# Patient Record
Sex: Female | Born: 1940 | Race: White | Hispanic: No | Marital: Married | Smoking: Former smoker
Health system: Southern US, Community
[De-identification: ages and names within clinical notes are randomized; demographics above are authoritative.]

## PROBLEM LIST (undated history)

## (undated) DIAGNOSIS — K529 Noninfective gastroenteritis and colitis, unspecified: Secondary | ICD-10-CM

## (undated) DIAGNOSIS — J310 Chronic rhinitis: Secondary | ICD-10-CM

## (undated) DIAGNOSIS — K635 Polyp of colon: Secondary | ICD-10-CM

## (undated) DIAGNOSIS — J849 Interstitial pulmonary disease, unspecified: Secondary | ICD-10-CM

## (undated) DIAGNOSIS — E119 Type 2 diabetes mellitus without complications: Secondary | ICD-10-CM

## (undated) DIAGNOSIS — K624 Stenosis of anus and rectum: Secondary | ICD-10-CM

## (undated) DIAGNOSIS — K589 Irritable bowel syndrome without diarrhea: Secondary | ICD-10-CM

## (undated) DIAGNOSIS — E079 Disorder of thyroid, unspecified: Secondary | ICD-10-CM

## (undated) HISTORY — PX: DILATION AND CURETTAGE OF UTERUS: SHX78

## (undated) HISTORY — DX: Polyp of colon: K63.5

## (undated) HISTORY — DX: Stenosis of anus and rectum: K62.4

## (undated) HISTORY — DX: Interstitial pulmonary disease, unspecified: J84.9

## (undated) HISTORY — DX: Chronic rhinitis: J31.0

## (undated) HISTORY — PX: OTHER SURGICAL HISTORY: SHX169

## (undated) HISTORY — PX: CHOLECYSTECTOMY: SHX55

## (undated) HISTORY — DX: Disorder of thyroid, unspecified: E07.9

## (undated) HISTORY — DX: Noninfective gastroenteritis and colitis, unspecified: K52.9

## (undated) HISTORY — DX: Irritable bowel syndrome, unspecified: K58.9

## (undated) HISTORY — DX: Type 2 diabetes mellitus without complications: E11.9

---

## 1989-06-11 HISTORY — PX: HAND SURGERY: SHX662

## 1989-12-24 ENCOUNTER — Encounter: Payer: Self-pay | Admitting: Internal Medicine

## 1993-12-01 ENCOUNTER — Encounter: Payer: Self-pay | Admitting: Internal Medicine

## 1997-11-23 ENCOUNTER — Ambulatory Visit (HOSPITAL_COMMUNITY): Admission: RE | Admit: 1997-11-23 | Discharge: 1997-11-23 | Payer: Self-pay | Admitting: Internal Medicine

## 1998-02-16 ENCOUNTER — Ambulatory Visit (HOSPITAL_COMMUNITY): Admission: RE | Admit: 1998-02-16 | Discharge: 1998-02-16 | Payer: Self-pay | Admitting: Neurosurgery

## 1998-02-16 ENCOUNTER — Encounter: Payer: Self-pay | Admitting: Neurosurgery

## 1999-12-04 ENCOUNTER — Emergency Department (HOSPITAL_COMMUNITY): Admission: EM | Admit: 1999-12-04 | Discharge: 1999-12-04 | Payer: Self-pay | Admitting: Emergency Medicine

## 2000-03-10 ENCOUNTER — Ambulatory Visit (HOSPITAL_COMMUNITY): Admission: RE | Admit: 2000-03-10 | Discharge: 2000-03-10 | Payer: Self-pay | Admitting: Family Medicine

## 2000-03-10 ENCOUNTER — Encounter: Payer: Self-pay | Admitting: Family Medicine

## 2000-07-11 ENCOUNTER — Ambulatory Visit (HOSPITAL_BASED_OUTPATIENT_CLINIC_OR_DEPARTMENT_OTHER): Admission: RE | Admit: 2000-07-11 | Discharge: 2000-07-11 | Payer: Self-pay | Admitting: Orthopedic Surgery

## 2000-07-16 ENCOUNTER — Ambulatory Visit (HOSPITAL_COMMUNITY): Admission: RE | Admit: 2000-07-16 | Discharge: 2000-07-16 | Payer: Self-pay | Admitting: Orthopedic Surgery

## 2001-04-17 ENCOUNTER — Encounter: Admission: RE | Admit: 2001-04-17 | Discharge: 2001-04-17 | Payer: Self-pay | Admitting: Family Medicine

## 2001-04-17 ENCOUNTER — Encounter: Payer: Self-pay | Admitting: Family Medicine

## 2004-12-19 ENCOUNTER — Ambulatory Visit: Payer: Self-pay | Admitting: Internal Medicine

## 2006-04-11 ENCOUNTER — Encounter (INDEPENDENT_AMBULATORY_CARE_PROVIDER_SITE_OTHER): Payer: Self-pay | Admitting: Specialist

## 2006-04-11 ENCOUNTER — Ambulatory Visit (HOSPITAL_BASED_OUTPATIENT_CLINIC_OR_DEPARTMENT_OTHER): Admission: RE | Admit: 2006-04-11 | Discharge: 2006-04-11 | Payer: Self-pay | Admitting: Surgery

## 2006-12-12 ENCOUNTER — Ambulatory Visit: Payer: Self-pay | Admitting: Internal Medicine

## 2007-10-09 DIAGNOSIS — K624 Stenosis of anus and rectum: Secondary | ICD-10-CM | POA: Insufficient documentation

## 2007-10-09 DIAGNOSIS — R197 Diarrhea, unspecified: Secondary | ICD-10-CM

## 2007-10-09 DIAGNOSIS — K589 Irritable bowel syndrome without diarrhea: Secondary | ICD-10-CM

## 2008-02-04 ENCOUNTER — Encounter: Payer: Self-pay | Admitting: Internal Medicine

## 2008-02-09 ENCOUNTER — Encounter: Payer: Self-pay | Admitting: Internal Medicine

## 2008-03-04 ENCOUNTER — Telehealth: Payer: Self-pay | Admitting: Internal Medicine

## 2008-03-11 ENCOUNTER — Encounter: Payer: Self-pay | Admitting: Internal Medicine

## 2008-04-12 ENCOUNTER — Ambulatory Visit: Payer: Self-pay | Admitting: Internal Medicine

## 2008-04-12 DIAGNOSIS — F341 Dysthymic disorder: Secondary | ICD-10-CM | POA: Insufficient documentation

## 2008-10-27 ENCOUNTER — Encounter: Payer: Self-pay | Admitting: Internal Medicine

## 2009-01-18 ENCOUNTER — Other Ambulatory Visit: Admission: RE | Admit: 2009-01-18 | Discharge: 2009-01-18 | Payer: Self-pay | Admitting: Family Medicine

## 2009-07-19 ENCOUNTER — Encounter: Payer: Self-pay | Admitting: Internal Medicine

## 2009-08-04 ENCOUNTER — Encounter: Payer: Self-pay | Admitting: Internal Medicine

## 2009-10-11 ENCOUNTER — Ambulatory Visit: Payer: Self-pay | Admitting: Internal Medicine

## 2009-10-18 ENCOUNTER — Telehealth: Payer: Self-pay | Admitting: Internal Medicine

## 2009-11-10 ENCOUNTER — Telehealth: Payer: Self-pay | Admitting: Internal Medicine

## 2010-01-16 ENCOUNTER — Telehealth: Payer: Self-pay | Admitting: Internal Medicine

## 2010-01-17 ENCOUNTER — Ambulatory Visit: Payer: Self-pay | Admitting: Internal Medicine

## 2010-01-19 ENCOUNTER — Encounter: Payer: Self-pay | Admitting: Internal Medicine

## 2010-01-24 ENCOUNTER — Telehealth: Payer: Self-pay | Admitting: Internal Medicine

## 2010-01-25 ENCOUNTER — Encounter: Payer: Self-pay | Admitting: Internal Medicine

## 2010-06-11 ENCOUNTER — Emergency Department (HOSPITAL_COMMUNITY)
Admission: EM | Admit: 2010-06-11 | Discharge: 2010-06-11 | Payer: Self-pay | Source: Home / Self Care | Admitting: Emergency Medicine

## 2010-06-29 ENCOUNTER — Encounter: Payer: Self-pay | Admitting: Internal Medicine

## 2010-06-29 ENCOUNTER — Ambulatory Visit (HOSPITAL_BASED_OUTPATIENT_CLINIC_OR_DEPARTMENT_OTHER)
Admission: RE | Admit: 2010-06-29 | Discharge: 2010-06-29 | Payer: Self-pay | Source: Home / Self Care | Attending: Family Medicine | Admitting: Family Medicine

## 2010-07-10 ENCOUNTER — Ambulatory Visit
Admission: RE | Admit: 2010-07-10 | Discharge: 2010-07-10 | Payer: Self-pay | Source: Home / Self Care | Attending: Internal Medicine | Admitting: Internal Medicine

## 2010-07-10 DIAGNOSIS — R05 Cough: Secondary | ICD-10-CM | POA: Insufficient documentation

## 2010-07-10 DIAGNOSIS — J8410 Pulmonary fibrosis, unspecified: Secondary | ICD-10-CM | POA: Insufficient documentation

## 2010-07-13 NOTE — Assessment & Plan Note (Signed)
Summary: RX RENEWAL .Marland KitchenMarland KitchenEM   History of Present Illness Visit Type: Follow-up Visit Primary GI MD: Lina Sar MD Primary Provider: Carolynn Sayers, PA-C Requesting Provider: n/a Chief Complaint: F/u for IBS. Pt c/o three episodes of diarrhea since December. 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 She is here to refill her chlorazepate and Lomotil. She had one episode of incontinent stool and mucus last week. Overall, she has done better in the last few years than  back in the 1990s when I last saw her for her colonoscopy. She has a positive family history of colon polyps in her sister. She is status post remote cholecystectomy. The patient has been stressed out about her marital problems.Last colon 1995. , pt reminded to schedule recall  several times in past several years.   GI Review of Systems      Denies abdominal pain, acid reflux, belching, bloating, chest pain, dysphagia with liquids, dysphagia with solids, heartburn, loss of appetite, nausea, vomiting, vomiting blood, weight loss, and  weight gain.      Reports diarrhea.     Denies anal fissure, black tarry stools, change in bowel habit, constipation, diverticulosis, fecal incontinence, heme positive stool, hemorrhoids, irritable bowel syndrome, jaundice, light color stool, liver problems, rectal bleeding, and  rectal pain.    Current Medications (verified): 1)  Clorazepate Dipotassium 3.75 Mg Tabs (Clorazepate Dipotassium) .... Take 1 Tablet By Mouth Three Times A Day. Must Have Office Visit For Further Refills! 2)  Lomotil 2.5-0.025 Mg Tabs (Diphenoxylate-Atropine) .... Take 2 Tablets By Mouth Every Morning 3)  Hydrocodone-Acetaminophen 5-500 Mg Tabs (Hydrocodone-Acetaminophen) .... One Tablet By Mouth As Needed 4)  Levothyroxine Sodium 88 Mcg Tabs  (Levothyroxine Sodium) .... One Tablet By Mouth Once Daily 5)  Verapamil Hcl Cr 120 Mg Xr24h-Cap (Verapamil Hcl) .... 1/2 Tablet By Mouth Two Times A Day 6)  Cyclobenzaprine Hcl 10 Mg Tabs (Cyclobenzaprine Hcl) .... 1/2 To 1  Tablet By Mouth At Bedtime 7)  Glucosamine-Chondroitin  Tabs (Glucosamine-Chondroit-Vit C-Mn) .... One Tablet By Mouth Two Times A Day 8)  Calcium Carbonate-Vitamin D 600-400 Mg-Unit  Tabs (Calcium Carbonate-Vitamin D) .... One Tablet By Mouth Two Times A Day 9)  Vitamin E 600 Unit  Caps (Vitamin E) .... One Tablet By Mouth Once Daily 10)  Vitamin C 500 Mg  Tabs (Ascorbic Acid) .... One Tablet By Mouth Once Daily 11)  Fish Oil 1000 Mg Caps (Omega-3 Fatty Acids) .... One Tablet By Mouth Once Daily 12)  Biotin 1000 Mcg Tabs (Biotin) .... One Tablet By Mouth Once Daily 13)  Multivitamins   Tabs (Multiple Vitamin) .... One Tablet By Mouth Once Daily  Allergies (verified): 1)  ! Pcn 2)  ! Cortisone 3)  ! Biaxin  Past History:  Past Medical History: Reviewed history from 10/09/2007 and no changes required. 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 L hand surgery; Baker's Cyst removed 1991 D & C Cholecystectomy Tear Duct surgery  Family History: Family History of Colon Polyps: Sister? Family History of Diabetes: Sister Family History of Heart Disease: Mother No FH of Colon Cancer:  Social History: Retired  Alcohol Use - no Illicit Drug Use - no  Patient has never smoked.  Daily Caffeine Use  Review of Systems       The patient complains of cough and fatigue.  The patient denies allergy/sinus, anemia, anxiety-new, arthritis/joint pain, back pain, blood in urine, breast changes/lumps, change in vision, confusion, coughing up blood, depression-new, fainting, fever, headaches-new,  hearing problems, heart murmur, heart rhythm changes, itching, menstrual pain, muscle pains/cramps, night sweats, nosebleeds, pregnancy symptoms, shortness of breath, skin rash, sleeping problems, sore throat, swelling of feet/legs, swollen lymph glands, thirst - excessive , urination - excessive , urination changes/pain, urine leakage, vision changes, and voice change.         Pertinent positive and negative review of systems were noted in the above HPI. All other ROS was otherwise negative.   Vital Signs:  Patient profile:   70 year old female Height:      66 inches Weight:      182 pounds BMI:     29.48 BSA:     1.92 Pulse rate:   76 / minute Pulse rhythm:   regular BP sitting:   120 / 76  (left arm) Cuff size:   regular  Vitals Entered By: Ok Anis CMA (Oct 11, 2009 10:11 AM)  Physical Exam  General:  Well developed, well nourished, no acute distress. Eyes:  PERRLA, no icterus. Mouth:  No deformity or lesions, dentition normal. Neck:  Supple; no masses or thyromegaly. Lungs:  Clear throughout to auscultation. Heart:  Regular rate and rhythm; no murmurs, rubs,  or bruits. Abdomen:  Soft, nontender and nondistended. No masses, hepatosplenomegaly or hernias noted. Normal bowel sounds. Rectal:  decreased rectal sphincter tone. soft Hemoccult negative stool. No external hemorrhoids, no prolapse. Extremities:  No clubbing, cyanosis, edema or deformities noted. Skin:  Intact without significant lesions or rashes. Psych:  Alert and cooperative. Normal mood and affect.   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 There has been no significant change since my last exam on her.  Orders: Colonoscopy (Colon)  Problem # 2:  Hx of DIARRHEA, CHRONIC (ICD-787.91)  Patient has IBS with predominant diarrhea cntrolled on clorazepate and Lomotil.  Patient is due for her colonoscopy.  Orders: Colonoscopy (Colon)  Problem # 3:  FAMILY HISTORY COLONIC POLYPS (ICD-V18.51)  Patient has a family history of colon polyps in the her sister. She is due for a colonoscopy at this time and we will go forward with scheduling that.  Orders: Colonoscopy (Colon)  Patient Instructions: 1)  colonoscopy has been scheduled. 2)  Refill chlorazepate 3.75 mg 3 times a day. 3)  Refill Lomotil. 4)  I suggest the patient have counseling for depression. 5)  Copy sent to : Dr Kenton Kingfisher 6)  The medication list was reviewed and reconciled.  All changed / newly prescribed medications were explained.  A complete medication list was provided to the patient / caregiver. Prescriptions: CLORAZEPATE DIPOTASSIUM 3.75 MG TABS (CLORAZEPATE DIPOTASSIUM) Take 1 tablet by mouth three times a day.  #90 x 11   Entered by:   Lamona Curl CMA (AAMA)   Authorized by:   Hart Carwin MD   Signed by:   Lamona Curl CMA (AAMA) on 10/11/2009   Method used:   Printed then faxed to ...       [facility] ([facility]       [address]  [place], [place]  [address]       Ph: [phone] or [phone]       Fax: [phone]   RxID:   [id number] LOMOTIL 2.5-0.025 MG TABS (DIPHENOXYLATE-ATROPINE) Take 2 tablets by mouth every morning  #100 x 2   Entered by:   Lamona Curl CMA (AAMA)   Authorized by:   Hart Carwin MD   Signed by:   Lamona Curl CMA (AAMA) on 10/11/2009   Method used:   Printed then faxed to ...       [facility] ([facility]       [address]       [place], [place]  [address]       Ph: [phone] or [phone]       Fax: [phone]   RxID:   [id number] DULCOLAX 5 MG  TBEC (BISACODYL) Day before procedure take 2 at 3pm and 2 at 8pm.  #4 x 0   Entered by:   Lamona Curl CMA (AAMA)   Authorized by:   Hart Carwin MD   Signed by:   Lamona Curl CMA (AAMA) on 10/11/2009   Method used:    Electronically to        [facility] ([facility]       [address]       [place], [place]  [address]       Ph: [phone] or [phone]       Fax: [phone]   RxID:   [id number] REGLAN 10 MG  TABS (METOCLOPRAMIDE HCL) As per prep instructions.  #2 x 0   Entered by:   Lamona Curl CMA (AAMA)   Authorized by:   Hart Carwin MD   Signed by:   Lamona Curl CMA (AAMA) on 10/11/2009   Method used:   Electronically to        [facility] ([facility]       [address]       [place], [place]  [address]       Ph: [phone] or [phone]       Fax: [phone]   RxID:   [id number] MIRALAX   POWD (POLYETHYLENE GLYCOL 3350) As per prep  instructions.  #255gm x 0   Entered by:   Lamona Curl CMA (AAMA)   Authorized by:   Hart Carwin MD   Signed by:   Lamona Curl CMA (AAMA) on 10/11/2009   Method used:   Electronically to        [facility]       [address]       [place], [place]  [address]       Ph: [phone] or [phone]       Fax: [phone]   RxID:   [id number]

## 2010-07-13 NOTE — Progress Notes (Signed)
Summary: Wants to speak to Dr Juanda Chance  Phone Note Call from Patient Call back at [phone]   Caller: Patient Call For: Dr Juanda Chance Reason for Call: Talk to Nurse Summary of Call: Pt requesting to speak w/Dr Juanda Chance, did not wish to say why.   Initial call taken by: Misty Stanley,  January 16, 2010 1:37 PM  Follow-up for Phone Call        Dr Juanda Chance-  Patient refused to give any information as to what we can help her with. She only wants to speak with you so not sure what it is in regards to. She may be calling about her chlorazepate refills. Last time she was in the office, you ask that if she cancelled her colonoscopy again (she cx a couple times previously) to discontinue her refills until she had the procedure as she continues to have diarrhea at which time she comes to be seen in the office. At the time that she cancelled her last procedure scheduled for 11/15/09, I discontinued all prescriptions until she has her procedure. Please see my note dated 11/10/09 as well as the append. She is now scheduled for colonoscopy tommorrow at 8 am. Lamona Curl CMA [facility])  January 16, 2010 5:06 PM   Additional Follow-up for Phone Call Additional follow up Details #1::        I will talked to her at the time of colonoscopy Additional Follow-up by: Hart Carwin MD,  January 16, 2010 9:03 PM

## 2010-07-13 NOTE — Progress Notes (Signed)
Summary: refill complications  Phone Note Call from Patient Call back at [phone]   Caller: Patient Call For: Dr. Juanda Chance Reason for Call: Talk to Nurse Summary of Call: pt was told by pharmacy that she would need an office visit before any refills and pt thinks this is wrong information [facility], [phone] Initial call taken by: Vallarie Mare,  November 10, 2009 2:01 PM  Follow-up for Phone Call        Patient was okayed to get 1 refill of her clorazepate on 11/08/09 until her colonoscopy on 11/15/09. After she actually has her colonoscopy, we will okay more refills. She does not need an actual office visit though. I have called patient but got no answer on either number provided and there is no voicemail. I will call her back at a later time.  Follow-up by: Lamona Curl CMA [facility]),  November 10, 2009 2:35 PM  Additional Follow-up for Phone Call Additional follow up Details #1::        Left message on patient's voicemail with the above information. She is to call office if she has questions. Additional Follow-up by: Lamona Curl CMA [facility]),  November 11, 2009 8:33 AM     Appended Document: refill complications Patient has again cancelled her colonoscopy. All clorazepate and other refills have been discontinued until she actually has procedure.

## 2010-07-13 NOTE — Progress Notes (Signed)
Summary: doctor note  Phone Note Call from Patient Call back at Home Phone [phone] Call back at [phone]   Caller: Patient Call For: Dr. Juanda Chance Reason for Call: Talk to Nurse Summary of Call: needs a doctor note excusing her from jury duty... pt states she has received one of these before from Dr. Juanda Chance Initial call taken by: Vallarie Mare,  January 24, 2010 11:46 AM  Follow-up for Phone Call        OK to give an excuse on the basis of diarrhea and urgency with occassional incontinence. Follow-up by: Hart Carwin MD,  January 24, 2010 1:29 PM  Additional Follow-up for Phone Call Additional follow up Details #1::        Letter created and sent to patient. Additional Follow-up by: Lamona Curl CMA [facility]),  January 25, 2010 9:20 AM

## 2010-07-13 NOTE — Letter (Signed)
Summary: [facility] Instructions  [facility]  [address] [place], [place] [address]   Phone: [phone]  Fax: [phone]       Dariya Kluver    January 03, 1941    MRN: [id number]       Procedure Day /Date: 10/21/09 Friday     Arrival Time: 7:30 am     Procedure Time: 8:00 am     Location of Procedure:                    _x [facility] (4th Floor) _ _  [facility] ( Outpatient Registration)    _ _  [facility] ( Short Stay on [facility])   PREPARATION FOR COLONOSCOPY WITH MIRALAX  Starting 5 days prior to your procedure ( 10/16/09 ) do not eat nuts, seeds, popcorn, corn, beans, peas,  salads, or any raw vegetables.  Do not take any fiber supplements (e.g. Metamucil, Citrucel, and Benefiber). ____________________________________________________________________________________________________  THE DAY BEFORE YOUR PROCEDURE         DATE: 10/20/09 DAY: Thursday  1   Drink clear liquids the entire day-NO SOLID FOOD  2   Do not drink anything colored red or purple.  Avoid juices with pulp.  No orange juice.  3   Drink at least 64 oz. (8 glasses) of fluid/clear liquids during the day to prevent dehydration and help the prep work efficiently.  CLEAR LIQUIDS INCLUDE: Water Jello Ice Popsicles Tea (sugar ok, no milk/cream) Powdered fruit flavored drinks Coffee (sugar ok, no milk/cream) Gatorade Juice: apple, white grape, white cranberry  Lemonade Clear bullion, consomm, broth Carbonated beverages (any kind) Strained chicken noodle soup Hard Candy  4   Mix the entire bottle of Miralax with 64 oz. of Gatorade/Powerade in the morning and put in the refrigerator to chill.  5   At 3:00 pm take 2 Dulcolax/Bisacodyl tablets.  6   At 4:30 pm take one Reglan/Metoclopramide tablet.  7  Starting at 5:00 pm drink one 8 oz glass of the Miralax mixture every 15-20 minutes until you have finished drinking the entire 64 oz.  You should finish  drinking prep around 7:30 or 8:00 pm.  8   If you are nauseated, you may take the 2nd Reglan/Metoclopramide tablet at 6:30 pm.        9    At 8:00 pm take 2 more DULCOLAX/Bisacodyl tablets.     THE DAY OF YOUR PROCEDURE      DATE:  10/21/09 DAY: Friday  You may drink clear liquids until 6:00 am  (2 HOURS BEFORE PROCEDURE).   MEDICATION INSTRUCTIONS  Unless otherwise instructed, you should take regular prescription medications with a small sip of water as early as possible the morning of your procedure.        OTHER INSTRUCTIONS  You will need a responsible adult at least 70 years of age to accompany you and drive you home.   This person must remain in the waiting room during your procedure.  Wear loose fitting clothing that is easily removed.  Leave jewelry and other valuables at home.  However, you may wish to bring a book to read or an iPod/MP3 player to listen to music as you wait for your procedure to start.  Remove all body piercing jewelry and leave at home.  Total time from sign-in until discharge is approximately 2-3 hours.  You should go home directly after your procedure and rest.  You can  resume normal activities the day after your procedure.  The day of your procedure you should not:   Drive   Make legal decisions   Operate machinery   Drink alcohol   Return to work  You will receive specific instructions about eating, activities and medications before you leave.   The above instructions have been reviewed and explained to me by   Lamona Curl CMA [facility])  Oct 11, 2009 11:07 AM    I fully understand and can verbalize these instructions _____________________________ Date 10/11/09

## 2010-07-13 NOTE — Letter (Signed)
Summary: Patient Notice- Polyp Results   Gastroenterology  [address] [place], [place] [address]   Phone: [phone]  Fax: [phone]        January 19, 2010 MRN: [id number]    [facility] [address] [place], [place]  [address]    Dear Ms. Reeves,  I am pleased to inform you that the colon polyp(s) removed during your recent colonoscopy was (were) found to be benign (no cancer detected) upon pathologic examination.The polyp was hyperplastic ( not precancerous)  I recommend you have a repeat colonoscopy examination in 10_ years to look for recurrent polyps, as having colon polyps increases your risk for having recurrent polyps or even colon cancer in the future.  Should you develop new or worsening symptoms of abdominal pain, bowel habit changes or bleeding from the rectum or bowels, please schedule an evaluation with either your primary care physician or with me.  Additional information/recommendations:  _x_ No further action with gastroenterology is needed at this time. Please      follow-up with your primary care physician for your other healthcare      needs.  __ Please call [phone] to schedule a return visit to review your      situation.  __ Please keep your follow-up visit as already scheduled.  __ Continue treatment plan as outlined the day of your exam.  Please call us if you are having persistent problems or have questions about your condition that have not been fully answered at this time.  Sincerely,  Hart Carwin MD  This letter has been electronically signed by your physician.  Appended Document: Patient Notice- Polyp Results letter mailed

## 2010-07-13 NOTE — Progress Notes (Signed)
Summary: Diagnostic vs Screening  Phone Note Call from Patient Call back at Home Phone [phone]   Caller: Patient Call For: Dr Juanda Chance Reason for Call: Talk to Nurse, Insurance Question Details for Reason: Diagnostic vs. Screening Summary of Call: Pt called for clarification of insurance benefits; When advised this is not coded as "screening" as she is experiencing symptoms (ibs, diarrhea) per office note, Pt stated "this is asinine" and would like to speak to a nurse regarding her diagnosis. Initial call taken by: Dwan Bolt,  Oct 18, 2009 3:35 PM  Follow-up for Phone Call        Pt. is very insistent that "This is just a screening Colonoscopy"  States she has had diarrhea for years and doesn't think this should be included in her diagnosis.  I feel pt. will be better served speaking to my supervisor, Shirlee More, I explained to the patient I would have Shell call her tomorrow about her questions/concerns.  Follow-up by: Laureen Ochs LPN,  Oct 18, 2009 4:11 PM  Additional Follow-up for Phone Call Additional follow up Details #1::        10-19-09 243 pm- LM on voicemail for patient to call back to discuss.  Shirlee More- Site Mgr.  11-04-09 Never heard back from patient. Will release until patient calls back. Additional Follow-up by: Zackery Barefoot,  Nov 04, 2009 3:27 PM     Appended Document: Diagnostic vs Screening d/c'ed prescription for clorazepate recently until patient has her colonoscopy. I have actually okayed a 1 month prescription for patient until we see if she comes for her scheduled colonoscopy on 11/15/09.

## 2010-07-13 NOTE — Procedures (Signed)
Summary: Colonoscopy  Patient: [name] Note: All result statuses are Final unless otherwise noted.  Tests: (1) Colonoscopy (COL)   COL Colonoscopy           DONE     [facility]     [address]     [place], [place]  [address]           COLONOSCOPY PROCEDURE REPORT           PATIENT:  [name], [name]  MR#:  [id number]     BIRTHDATE:  25-Oct-1940, 68 yrs. old  GENDER:  female     ENDOSCOPIST:  Hedwig Morton. Juanda Chance, MD     REF. BY:  Orion Modest, PA     PROCEDURE DATE:  01/17/2010     PROCEDURE:  Colonoscopy 19[address]     ASA CLASS:  Class II     INDICATIONS:  Elevated Risk Screening sister with colon polyps,     last colon 1995     IBS/diarrhea     MEDICATIONS:   Versed 9 mg, Fentanyl 75 mcg, Benadryl 25 mg           DESCRIPTION OF PROCEDURE:   After the risks benefits and     alternatives of the procedure were thoroughly explained, informed     consent was obtained.  Digital rectal exam was performed and     revealed no rectal masses.   The LB [id number] endoscope     was introduced through the anus and advanced to the cecum, which     was identified by both the appendix and ileocecal valve, without     limitations.  The quality of the prep was good, using MiraLax.     The instrument was then slowly withdrawn as the colon was fully     examined.     <<PROCEDUREIMAGES>>           FINDINGS:  A diminutive polyp was found. at 10 cm 3 mm diminutive     polyps removed The polyp was removed using cold biopsy forceps     (see image4).  This was otherwise a normal examination of the     colon. Random biopsies were obtained and sent to pathology (see     image5, image3, image2, and image1). r/o microscopic colitis     Retroflexed views in the rectum revealed no abnormalities.    The     scope was then withdrawn from the patient and the procedure     completed.           COMPLICATIONS:  None     ENDOSCOPIC IMPRESSION:     1) Diminutive polyp     2) Otherwise normal  examination     s/p random biopsies     RECOMMENDATIONS:     1) Await pathology results     refill Chlorazepate 3.75 mg, # 90, 1 po tid for IBS, 2 refills     Lomotil, # 60 1 po qd prn diarrhea     REPEAT EXAM:  In 10 year(s) for.           ______________________________     Hedwig Morton. Juanda Chance, MD           CC:           n.     eSIGNED:   Hedwig Morton. Brodie at 01/17/2010 09:14 AM           Gawron, Eber Jones, [phone]  Note: An exclamation mark [name])  indicates a result that was not dispersed into the flowsheet. Document Creation Date: 01/17/2010 9:14 AM _______________________________________________________________________  (1) Order result status: Final Collection or observation date-time: 01/17/2010 09:00 Requested date-time:  Receipt date-time:  Reported date-time:  Referring Physician:   Ordering Physician: Lina Sar [phone]) Specimen Source:  Source: Launa Grill Order Number: [id number] Lab site:   Appended Document: Colonoscopy     Procedures Next Due Date:    Colonoscopy: 01/2020

## 2010-07-13 NOTE — Progress Notes (Signed)
Summary: Medication  Phone Note Call from Patient Call back at Home Phone [phone]   Caller: Patient Call For: Dr. Juanda Chance Reason for Call: Talk to Nurse Summary of Call: [facility]eds [facility] prep [facility] [address]procedure is sch'd for tomorrow Initial call taken by: Karna Christmas,  January 16, 2010 9:41 AM  Follow-up for Phone Call        [facility] and gave verbal to [facility] at this time. Patient advised. Follow-up by: Lamona Curl CMA (AAMA),  January 16, 2010 10:02 AM

## 2010-07-13 NOTE — Miscellaneous (Signed)
Summary: Clorazepate Refill  Clinical Lists Changes  Medications: Changed medication from CLORAZEPATE DIPOTASSIUM 3.75 MG TABS (CLORAZEPATE DIPOTASSIUM) Take 1 tablet by mouth three times a day to CLORAZEPATE DIPOTASSIUM 3.75 MG TABS (CLORAZEPATE DIPOTASSIUM) Take 1 tablet by mouth three times a day. MUST HAVE OFFICE VISIT FOR FURTHER REFILLS! - Signed Rx of CLORAZEPATE DIPOTASSIUM 3.75 MG TABS (CLORAZEPATE DIPOTASSIUM) Take 1 tablet by mouth three times a day. MUST HAVE OFFICE VISIT FOR FURTHER REFILLS!;  #90 x 0;  Signed;  Entered by: Hortense Ramal CMA (AAMA);  Authorized by: Hart Carwin MD;  Method used: Printed then faxed to [id number] [address], [place], [place]  [address], Ph: [phone] or [phone], Fax: [phone]    Prescriptions: CLORAZEPATE DIPOTASSIUM 3.75 MG TABS (CLORAZEPATE DIPOTASSIUM) Take 1 tablet by mouth three times a day. MUST HAVE OFFICE VISIT FOR FURTHER REFILLS!  #90 x 0   Entered by:   Hortense Ramal CMA (AAMA)   Authorized by:   Hart Carwin MD   Signed by:   Hortense Ramal CMA (AAMA) on 08/04/2009   Method used:   Printed then faxed to ...       [id number] (retail)       [address]       [place], [place]  [address]       Ph: [phone] or [phone]       Fax: [phone]   RxID:   [id number]

## 2010-07-13 NOTE — Letter (Signed)
Summary: [facility]  [address] [place], [place] [address]   Phone: [phone]  Fax: [phone]              01/25/2010  RE: AKIBA Goering     [address]     [place], [place]  [address]  [place]  JUROR # [phone] PANEL # [id number]    To Whom It May Concern,  Ms. Horrell is a patient who has been under my care for several years for a medical condition with symptoms involving diarrhea, urgency and occasional incontinenece. Unfortunately, her condition will most likely render her unable to sit for extended periods of time without access to a restroom.   Due to these circumstances, I feel that it would be best that Ms. Parrack be excused for jury duty at this time. I appreciate your consideration of this case and will be available at my office number, [phone] should you need additional information.  Sincerely,     Hedwig Morton. Juanda Chance, MD  Appended Document: Marvetta Gibbons Letter mailed to Trial Court Administrator Attn: Melina Copa Request [address] [place], [place] [address]  Copy of letter also mailed to patient for her records.

## 2010-07-19 NOTE — Assessment & Plan Note (Signed)
Summary: Pulmonary/ new pt eval for cough/ ild with bronchiectasis   Visit Type:  Initial Consult Copy to:  Dr. Marinda Elk Primary Provider/Referring Provider:  Dr. Marinda Elk  CC:  Abnormal CT Chest/Cough.  History of Present Illness: 70 yowf  quit smoking in the 1980s with h/o upper allergies in the 1960s dx as allergies by Dr Nira Retort to mold , mildrew and grass but no chronic lower resp complaints esp problems p quit smoking   July 10, 2010  1st pulmonary office eval with chronic cough esp x 3 years seems worse after xmas and early in am > clear mucus sometimes green, severity of cough worse since Fall of 2011.  use cough drops. cough better with hydrocodone .  does not wake up coughing.  sob with activity esp hills but mostly worse sob with coughing fits. assoc with nasal congestion also.   Pt denies any significant sore throat, dysphagia, itching, sneezing,    excess or purulent secretions,  fever, chills, sweats, unintended wt loss, pleuritic or exertional cp, hempoptysis, change in activity tolerance  orthopnea pnd or leg swelling Pt also denies any obvious fluctuation in symptoms with weather or environmental change or other alleviating or aggravating factors.       Current Medications (verified): 1)  Clorazepate Dipotassium 3.75 Mg Tabs (Clorazepate Dipotassium) .... Take 1 Tablet By Mouth Three Times A Day. 2)  Lomotil 2.5-0.025 Mg Tabs (Diphenoxylate-Atropine) .... Take 1 By Mouth Every Morning 3)  Hydrocodone-Acetaminophen 5-500 Mg Tabs (Hydrocodone-Acetaminophen) .... One Tablet By Mouth As Needed 4)  Levothyroxine Sodium 88 Mcg Tabs (Levothyroxine Sodium) .... One Tablet By Mouth Once Daily 5)  Verapamil Hcl Cr 120 Mg Xr24h-Cap (Verapamil Hcl) .... 1/2 Tablet By Mouth Two Times A Day 6)  Cyclobenzaprine Hcl 10 Mg Tabs (Cyclobenzaprine Hcl) .... 1/2 To 1  Tablet By Mouth At Bedtime 7)  Glucosamine-Chondroitin  Tabs (Glucosamine-Chondroit-Vit C-Mn) .... One Tablet By Mouth  Two Times A Day 8)  Calcium Carbonate-Vitamin D 600-400 Mg-Unit  Tabs (Calcium Carbonate-Vitamin D) .... One Tablet By Mouth Two Times A Day 9)  Vitamin E 600 Unit  Caps (Vitamin E) .... One Tablet By Mouth Once Daily 10)  Vitamin C 500 Mg  Tabs (Ascorbic Acid) .... One Tablet By Mouth Once Daily 11)  Fish Oil 1000 Mg Caps (Omega-3 Fatty Acids) .... One Tablet By Mouth Once Daily 12)  Biotin 1000 Mcg Tabs (Biotin) .... One Tablet By Mouth Once Daily 13)  Multivitamins   Tabs (Multiple Vitamin) .... One Tablet By Mouth Once Daily  Allergies (verified): 1)  ! Pcn 2)  ! Cortisone 3)  ! Biaxin 4)  ! Betadine  Social History: Retired  Alcohol Use - no Illicit Drug Use - no Former smoker.  Quit in 1980.  Smoked for 15 yrs up to 3 ppd Daily Caffeine Use  Review of Systems       The patient complains of shortness of breath with activity, productive cough, nasal congestion/difficulty breathing through nose, depression, and joint stiffness or pain.  The patient denies shortness of breath at rest, coughing up blood, chest pain, irregular heartbeats, acid heartburn, indigestion, loss of appetite, weight change, abdominal pain, difficulty swallowing, sore throat, tooth/dental problems, headaches, sneezing, itching, ear ache, anxiety, hand/feet swelling, rash, change in color of mucus, and fever.    Vital Signs:  Patient profile:   70 year old female Weight:      188 pounds BMI:     30.45 O2 Sat:  97 % on Room air Temp:     97.5 degrees F oral Pulse rate:   84 / minute BP sitting:   124 / 80  (left arm) Cuff size:   large  Vitals Entered By: Vernie Murders (July 10, 2010 8:46 AM)  O2 Flow:  Room air  Physical Exam  Additional Exam:  wt 182 > 188 July 10, 2010 amb wf nad HEENT: nl dentition, turbinates, and orophanx. Nl external ear canals without cough reflex NECK :  without JVD/Nodes/TM/ nl carotid upstrokes bilaterally LUNGS: no acc muscle use, clear to A and P bilaterally  without cough on insp or exp maneuvers CV:  RRR  no s3 or murmur or increase in P2, no edema  ABD:  soft and nontender with nl excursion in the supine position. No bruits or organomegaly, bowel sounds nl MS:  warm without deformities, calf tenderness, cyanosis or clubbing SKIN: warm and dry without lesions   NEURO:  alert, approp, no deficits     Impression & Recommendations:  Problem # 1:  COUGH (ICD-786.2)  The most common causes of chronic cough in immunocompetent adults include: upper airway cough syndrome (UACS), previously referred to as postnasal drip syndrome,  caused by variety of rhinosinus conditions; (2) asthma; (3) GERD; (4) chronic bronchitis from cigarette smoking or other inhaled environmental irritants; (5) nonasthmatic eosinophilic bronchitis; and (6) bronchiectasis. These conditions, singly or in combination, have accounted for up to 94% of the causes of chronic cough in prospective studies.  this is most c/w  Classic Upper airway cough syndrome, so named because it's frequently impossible to sort out how much is  CR/sinusitis with freq throat clearing (which can be related to primary GERD)   vs  causing  secondary extra esophageal GERD from wide swings in gastric pressure that occur with throat clearing, promoting self use of mint and menthol lozenges that reduce the lower esophageal sphincter tone and exacerbate the problem further These are the same pts who not infrequently have failed to tolerate ace inhibitors,  dry powder inhalers or biphosphonates or report having reflux symptoms that don't respond to standard doses of PPI  See instructions for specific recommendations   Orders: New Patient Level V (60109)  Problem # 2:  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 No risk factors identified x for two nephews with apparent IPF/UIP       Use of PPI is associated with improved survival time and with decreased radiologic fibrosis per King's study published in AJRCCM vol 184 p1390.  This may not be cause and effect, but given how universally unhelpful all the otherstudy drugs have been for pf,   rec start  rx ppi / diet/ lifestyle modification.   then return for pft's and serial walking sats to determine whether any further w/u is needed  Medications Added to Medication List This Visit: 1)  Pepcid 20 Mg Tabs (Famotidine) .... Take one by mouth at bedtime  Patient Instructions: 1)  GERD (REFLUX)  is a common cause of respiratory symptoms. It commonly presents without heartburn and can be treated with medication, but also with lifestyle changes including avoidance of late meals, excessive alcohol, smoking cessation, and avoid  fatty foods, chocolate, peppermint, colas, red wine, and acidic juices such as orange juice. NO MINT OR MENTHOL PRODUCTS SO NO COUGH DROPS  2)  USE SUGARLESS CANDY INSTEAD (jolley ranchers)  3)  NO OIL BASED VITAMINS  - discuss with your eye doctor other options  4)  Elevate the head of bed if you can 5)  Pepcid 20 mg one at bedtime 6)  Best choice for cough medications is mucinex dm 7)  Please schedule a follow-up appointment in 6 weeks, sooner if needed with PFT's  and CXR

## 2010-08-21 LAB — DIFFERENTIAL
Basophils Absolute: 0 10*3/uL (ref 0.0–0.1)
Lymphocytes Relative: 43 % (ref 12–46)
Monocytes Absolute: 0.3 10*3/uL (ref 0.1–1.0)
Neutro Abs: 3 10*3/uL (ref 1.7–7.7)

## 2010-08-21 LAB — CBC
Hemoglobin: 12.3 g/dL (ref 12.0–15.0)
MCH: 31.2 pg (ref 26.0–34.0)
MCHC: 33.9 g/dL (ref 30.0–36.0)
MCV: 92.1 fL (ref 78.0–100.0)
RBC: 3.94 MIL/uL (ref 3.87–5.11)

## 2010-08-21 LAB — BASIC METABOLIC PANEL
CO2: 27 mEq/L (ref 19–32)
Calcium: 8.9 mg/dL (ref 8.4–10.5)
Chloride: 107 mEq/L (ref 96–112)
Creatinine, Ser: 0.89 mg/dL (ref 0.4–1.2)
GFR calc Af Amer: >60 mL/min (ref >60)
Glucose, Bld: 156 mg/dL — ABNORMAL HIGH (ref 70–99)

## 2010-08-23 ENCOUNTER — Other Ambulatory Visit: Payer: Self-pay | Admitting: Internal Medicine

## 2010-08-23 ENCOUNTER — Other Ambulatory Visit: Payer: Medicare Other

## 2010-08-23 ENCOUNTER — Ambulatory Visit (INDEPENDENT_AMBULATORY_CARE_PROVIDER_SITE_OTHER)
Admission: RE | Admit: 2010-08-23 | Discharge: 2010-08-23 | Disposition: A | Discharge status: Home / Self Care | Payer: Medicare Other | Source: Ambulatory Visit | Attending: Internal Medicine | Admitting: Internal Medicine

## 2010-08-23 ENCOUNTER — Ambulatory Visit (INDEPENDENT_AMBULATORY_CARE_PROVIDER_SITE_OTHER): Payer: Medicare Other | Admitting: Internal Medicine

## 2010-08-23 ENCOUNTER — Encounter: Payer: Self-pay | Admitting: Internal Medicine

## 2010-08-23 ENCOUNTER — Encounter (INDEPENDENT_AMBULATORY_CARE_PROVIDER_SITE_OTHER): Payer: Medicare Other

## 2010-08-23 DIAGNOSIS — J329 Chronic sinusitis, unspecified: Secondary | ICD-10-CM

## 2010-08-23 DIAGNOSIS — J8410 Pulmonary fibrosis, unspecified: Secondary | ICD-10-CM

## 2010-08-23 DIAGNOSIS — R05 Cough: Secondary | ICD-10-CM

## 2010-08-23 LAB — CBC WITH DIFFERENTIAL/PLATELET
Basophils Relative: 0.4 % (ref 0.0–3.0)
Eosinophils Relative: 1.7 % (ref 0.0–5.0)
HCT: 36.4 % (ref 36.0–46.0)
Hemoglobin: 12.7 g/dL (ref 12.0–15.0)
Lymphs Abs: 1.8 10*3/uL (ref 0.7–4.0)
Monocytes Relative: 4.9 % (ref 3.0–12.0)
Neutro Abs: 2.8 10*3/uL (ref 1.4–7.7)
RDW: 12.5 % (ref 11.5–14.6)
WBC: 5 10*3/uL (ref 4.5–10.5)

## 2010-08-24 ENCOUNTER — Ambulatory Visit (INDEPENDENT_AMBULATORY_CARE_PROVIDER_SITE_OTHER)
Admission: RE | Admit: 2010-08-24 | Discharge: 2010-08-24 | Disposition: A | Discharge status: Home / Self Care | Payer: Medicare Other | Source: Ambulatory Visit | Attending: Internal Medicine | Admitting: Internal Medicine

## 2010-08-24 DIAGNOSIS — J329 Chronic sinusitis, unspecified: Secondary | ICD-10-CM

## 2010-08-28 ENCOUNTER — Encounter: Payer: Self-pay | Admitting: Internal Medicine

## 2010-08-29 NOTE — Assessment & Plan Note (Signed)
Summary: pft charges   Allergies: 1)  ! Pcn 2)  ! Cortisone 3)  ! Biaxin 4)  ! Betadine   Other Orders: Carbon Monoxide diffusing w/capacity (62130) Lung Volumes/Gas dilution or washout (86578) Spirometry (Pre & Post) [phone])

## 2010-08-29 NOTE — Assessment & Plan Note (Signed)
Summary: Pulmonary/ f/u ild/ bronchiectasis and walking sats = ok x 3 lap   Copy to:  Dr. Marinda Elk Primary Provider/Referring Provider:  Dr. Marinda Elk  CC:  Cough- no change.  History of Present Illness: 89 yowf  quit smoking in the 1980s with h/o upper allergies in the 1960s dx as allergies by Dr Nira Retort to mold , mildrew and grass but no chronic lower resp complaints esp problems p quit smoking   July 10, 2010  1st pulmonary office eval with chronic cough esp x 3 years seems worse after xmas and early in am > clear mucus sometimes green, severity of cough worse since Fall of 2011.  use cough drops. cough better with hydrocodone .  does not wake up coughing.  sob with activity esp hills but mostly worse sob with coughing fits. assoc with nasal congestion also.   GERD (REFLUX)  diet  Elevate the head of bed if you can Pepcid 20 mg one at bedtime Best choice for cough medications is mucinex dm Please schedule a follow-up appointment in 6 weeks, sooner if needed with PFT's  and CXR   August 23, 2010 ov cc  overall no change cough or doe but having overt hb symptoms now despite pepcid. Pt denies any significant sore throat, dysphagia, itching, sneezing,  nasal congestion or excess secretions,  fever, chills, sweats, unintended wt loss, pleuritic or exertional cp, hempoptysis, change in activity tolerance  orthopnea pnd or leg swelling Pt also denies any obvious fluctuation in symptoms with weather or environmental change or other alleviating or aggravating factors.         Current Medications (verified): 1)  Clorazepate Dipotassium 3.75 Mg Tabs (Clorazepate Dipotassium) .... Take 1 Tablet By Mouth Three Times A Day. 2)  Lomotil 2.5-0.025 Mg Tabs (Diphenoxylate-Atropine) .... Take 1 By Mouth Every Morning 3)  Hydrocodone-Acetaminophen 5-500 Mg Tabs (Hydrocodone-Acetaminophen) .... One Tablet By Mouth As Needed 4)  Levothyroxine Sodium 88 Mcg Tabs (Levothyroxine Sodium) .... One Tablet  By Mouth Once Daily 5)  Verapamil Hcl Cr 120 Mg Xr24h-Cap (Verapamil Hcl) .... 1/2 Tablet By Mouth Two Times A Day 6)  Cyclobenzaprine Hcl 10 Mg Tabs (Cyclobenzaprine Hcl) .... 1/2 To 1  Tablet By Mouth At Bedtime 7)  Glucosamine-Chondroitin  Tabs (Glucosamine-Chondroit-Vit C-Mn) .... One Tablet By Mouth Two Times A Day 8)  Calcium Carbonate-Vitamin D 600-400 Mg-Unit  Tabs (Calcium Carbonate-Vitamin D) .... One Tablet By Mouth Two Times A Day 9)  Vitamin C 500 Mg  Tabs (Ascorbic Acid) .... One Tablet By Mouth Once Daily 10)  Biotin 1000 Mcg Tabs (Biotin) .... One Tablet By Mouth Once Daily 11)  Multivitamins   Tabs (Multiple Vitamin) .... One Tablet By Mouth Once Daily 12)  Pepcid 20 Mg Tabs (Famotidine) .... Take One By Mouth At Bedtime 13)  Mucinex Dm 30-600 Mg Xr12h-Tab (Dextromethorphan-Guaifenesin) .[name].. 1 At Bedtime 14)  Advil 200 Mg Tabs (Ibuprofen) .... 2 At Bedtime  Allergies (verified): 1)  ! Pcn 2)  ! Cortisone 3)  ! Biaxin 4)  ! 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) IRRITABLE BOWEL SYNDROME (ICD-564.1) Hx of DIARRHEA, CHRONIC (ICD-787.91) ILD/Bronchiectasis     - PFT's  August 23, 2010 VC 82%   DLCO 62 > 129 corrected Crhonic rhinitis      - Sinus CT ordered August 23, 2010 >>>  Vital Signs:  Patient profile:   70 year old female Weight:  186 pounds O2 Sat:      95 % on Room air Temp:     97.6 degrees F oral Pulse rate:   87 / minute BP sitting:   142 / 76  (left arm)  Vitals Entered By: Vernie Murders (August 23, 2010 9:45 AM)  O2 Flow:  Room air  Serial Vital Signs/Assessments:  Comments: 10:39 AM Ambulatory Pulse Oximetry  Resting; HR__76___    02 Sat__96%ra___  Lap1 (185 feet)   HR__110___   02 Sat__93%ra___ Lap2 (185 feet)   HR__104___   02 Sat__92%ra___    Lap3 (185 feet)   HR__135___   02 Sat__91%ra___  _x__Test Completed without Difficulty ___Test Stopped due to:   By: Vernie Murders    Physical Exam  Additional Exam:  wt 182 > 188 July 10, 2010 > 186 August 23, 2010  amb wf nad HEENT: nl dentition, turbinates, and orophanx. Nl external ear canals without cough reflex NECK :  without JVD/Nodes/TM/ nl carotid upstrokes bilaterally LUNGS: no acc muscle use, clear to A and P bilaterally without cough on insp or exp maneuvers CV:  RRR  no s3 or murmur or increase in P2, no edema  ABD:  soft and nontender with nl excursion in the supine position. No bruits or organomegaly, bowel sounds nl MS:  warm without deformities, calf tenderness, cyanosis or clubbing SKIN: warm and dry without lesions      White Cell Count          5.0 K/uL                    4.5-10.5   Red Cell Count            3.88 Mil/uL                 3.87-5.11   Hemoglobin                12.7 g/dL                   14.7-82.9   Hematocrit                36.4 %                      36.0-46.0   MCV                       93.8 fl                     78.0-100.0   MCHC                      35.0 g/dL                   56.2-13.0   RDW                       12.5 %                      11.5-14.6   Platelet Count            199.0 K/uL                  150.0-400.0   Neutrophil %              57.1 %  43.0-77.0   Lymphocyte %              35.9 %                      12.0-46.0   Monocyte %                4.9 %                       3.0-12.0   Eosinophils%              1.7 %                       0.0-5.0   Basophils %               0.4 %                       0.0-3.0   Neutrophill Absolute      2.8 K/uL                    1.4-7.7   Lymphocyte Absolute       1.8 K/uL                    0.7-4.0   Monocyte Absolute         0.2 K/uL                    0.1-1.0  Eosinophils, Absolute                             0.1 K/uL                    0.0-0.7   Basophils Absolute        0.0 K/uL                    0.0-0.1  Tests: (2) Sed Rate (ESR)   Sed Rate             [H]  38 mm/hr                     0-22  CXR  Procedure date:  08/23/2010  Findings:       Comparison: No prior plain films.  There is correlation with CT of the chest 06/29/2010  Findings: Heart size normal.  There is central airway thickening and prominence of interstitial markings similar to the CT scan.  No active airspace disease or pleural fluid.  Osseous structures intact.  IMPRESSION: Central airway thickening and chronic interstitial changes - no active disease.  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 No risk factors identified x for two nephews with apparent IPF/UIP       Use of PPI is associated with improved survival time and with decreased radiologic fibrosis per King's study published in AJRCCM vol 184 p1390.  This may not be cause and effect, but given how universally unhelpful all the otherstudy  drugs have been for pf,   rec start  rx ppi / diet/ lifestyle modification.   key is serial pfts and walking sats to determine natural hx while on max rx for gerd  Problem # 2:  COUGH (ICD-786.2) The most common causes of chronic cough in immunocompetent adults include: upper airway cough syndrome (UACS), previously referred to as postnasal drip syndrome,  caused by variety of rhinosinus conditions; (2) asthma; (3) GERD; (4) chronic bronchitis from cigarette smoking or other inhaled environmental irritants; (5) nonasthmatic eosinophilic bronchitis; and (6) bronchiectasis. These conditions, singly or in combination, have accounted for up to 94% of the  causes of chronic cough in prospective studies.  this is most c/w  Classic Upper airway cough syndrome, so named because it's frequently impossible to sort out how much is  CR/sinusitis with freq throat clearing (which can be related to primary GERD)   vs  causing  secondary extra esophageal GERD from wide swings in gastric pressure that occur with throat clearing, promoting self use of mint and menthol lozenges that reduce the lower esophageal sphincter tone and exacerbate the problem further These are the same pts who not infrequently have failed to tolerate ace inhibitors,  dry powder inhalers or biphosphonates or report having reflux symptoms that don't respond to standard doses of PPI  next step is allergy profile and sinus ct  lack of cough on insp against ild as cause  Medications Added to Medication List This Visit: 1)  Mucinex Dm 30-600 Mg Xr12h-tab (Dextromethorphan-guaifenesin) .[name].. 1 at bedtime 2)  Advil 200 Mg Tabs (Ibuprofen) .... 2 at bedtime 3)  Zegerid 40-1100 Mg Caps (Omeprazole-sodium bicarbonate) .... One by mouth daily  Other Orders: T-2 View CXR (71020TC) T-Allergy Profile [place], [place], [place], [place], [place] [phone]) Misc. Referral (Misc. Ref) Est. Patient Level IV (99214) Pulse Oximetry, Ambulatory (13086) Prescription Created Electronically [phone]) TLB-CBC Platelet - w/Differential (85025-CBCD) TLB-Sedimentation Rate (ESR) (85652-ESR)  Patient Instructions: 1)  Zegerid 40 mg one at bedtime instead of pepcid 2)  See Patient Care Coordinator before leaving for for sinus CT 3)  Please schedule a follow-up appointment in 6 weeks, sooner if needed  Prescriptions: ZEGERID 40-1100 MG  CAPS (OMEPRAZOLE-SODIUM BICARBONATE) One by mouth daily  #34 x 2   Entered and Authorized by:   Nyoka Cowden MD   Signed by:   Nyoka Cowden MD on 08/23/2010   Method used:   Electronically to        [id number] ([facility]ail)       [address]       [place], [place]  [address]       Ph:  [phone] or [phone]       Fax: [phone]   RxID:   [id number]

## 2010-09-07 NOTE — Miscellaneous (Signed)
Summary: Clorazepate  Clinical Lists Changes  Medications: Changed medication from CLORAZEPATE DIPOTASSIUM 3.75 MG TABS (CLORAZEPATE DIPOTASSIUM) Take 1 tablet by mouth three times a day. to CLORAZEPATE DIPOTASSIUM 3.75 MG TABS (CLORAZEPATE DIPOTASSIUM) Take 1 tablet by mouth three times a day. - Signed Rx of CLORAZEPATE DIPOTASSIUM 3.75 MG TABS (CLORAZEPATE DIPOTASSIUM) Take 1 tablet by mouth three times a day.;  #90 x 1;  Signed;  Entered by: Lamona Curl CMA (AAMA);  Authorized by: Hart Carwin MD;  Method used: Printed then faxed to [id number] [address] [place], [place]  [address], Ph: [phone] or [phone], Fax: [phone]    Prescriptions: CLORAZEPATE DIPOTASSIUM 3.75 MG TABS (CLORAZEPATE DIPOTASSIUM) Take 1 tablet by mouth three times a day.  #90 x 1   Entered by:   Lamona Curl CMA (AAMA)   Authorized by:   Hart Carwin MD   Signed by:   Lamona Curl CMA (AAMA) on 08/28/2010   Method used:   Printed then faxed to ...       [id number] (retail[facility]       [address]       [place], [place]  [address]       Ph: [phone] or [phone]       Fax: [phone]   RxID:   [id number]

## 2010-09-28 ENCOUNTER — Encounter: Payer: Self-pay | Admitting: Internal Medicine

## 2010-10-02 ENCOUNTER — Ambulatory Visit (INDEPENDENT_AMBULATORY_CARE_PROVIDER_SITE_OTHER): Payer: Medicare Other | Admitting: Internal Medicine

## 2010-10-02 ENCOUNTER — Encounter: Payer: Self-pay | Admitting: Internal Medicine

## 2010-10-02 VITALS — BP 112/66 | HR 81 | Temp 98.1°F | Ht 66.0 in | Wt 190.8 lb

## 2010-10-02 DIAGNOSIS — R05 Cough: Secondary | ICD-10-CM

## 2010-10-02 DIAGNOSIS — J8410 Pulmonary fibrosis, unspecified: Secondary | ICD-10-CM

## 2010-10-02 DIAGNOSIS — J310 Chronic rhinitis: Secondary | ICD-10-CM

## 2010-10-02 DIAGNOSIS — K219 Gastro-esophageal reflux disease without esophagitis: Secondary | ICD-10-CM

## 2010-10-02 MED ORDER — PANTOPRAZOLE SODIUM 40 MG PO TBEC: DELAYED_RELEASE_TABLET | ORAL | Status: DC

## 2010-10-02 NOTE — Assessment & Plan Note (Signed)
Very mild by ct chest, PFTs no clubbing and bronchiectatic changes all very nonspecific findings with DDx for pulmonary fibrosis with honeycombing includes idiopathic pulmonary fibrosis, pulmonary fibrosis associated with rheumatologic disease, adverse effect from drugs such as chemotherapy or amiodarone exposure, nonspecific interstitial pneumonia which is typically steroid responsive, and chronic hypersensitivity pneumonitis. No risk factors identified x for two nephews with apparent IPF/UIP   Allergy profile neg, most of her symptoms may actually be from pnds/ chronic rhinits so rx this first

## 2010-10-02 NOTE — Assessment & Plan Note (Signed)
Need to irradicate sinus dz as much as possible here as the first step.     Each maintenance medication was reviewed in detail including most importantly the difference between maintenance and as needed and under what circumstances the prns are to be used.  Please see instructions for details which were reviewed in writing and the patient given a copy.

## 2010-10-02 NOTE — Progress Notes (Signed)
  Subjective:    Patient ID: [name], female    DOB: 1940/12/02, 70 y.o.   MRN: [id number]  HPI    Primary Provider/Referring Provider: Dr. Marinda [name]     69 yowf quit smoking in the 1980s with h/o upper allergies in the 1960s dx as allergies by Dr [name] to mold , mildrew and grass but no chronic lower resp complaints esp problems p quit smoking   July 10, 2010 1st pulmonary office eval with chronic cough esp x 3 years seems worse after xmas and early in am > clear mucus sometimes green, severity of cough worse since Fall of 2011. use cough drops. cough better with hydrocodone . does not wake up coughing. sob with activity esp hills but mostly worse sob with coughing fits. assoc with nasal congestion also.  GERD (REFLUX) diet  Elevate the head of bed if you can  Pepcid 20 mg one at bedtime  Best choice for cough medications is mucinex dm  Please schedule a follow-up appointment in 6 weeks, sooner if needed with PFT's and CXR.   August 23, 2010 ov cc overall no change cough or doe but having overt hb symptoms now despite pepcid. rec change to hs zegrid, insurance declined, cough worse off rx sinus ct pos acute sinusitis rx with 10 day avelox , transiently better  10/02/2010 ov/[name]  Cough and sob worse off ppi. No purulent sputum but lots of am mucoid production. Pt denies any significant sore throat, dysphagia, itching, sneezing,   fever, chills, sweats, unintended wt loss, pleuritic or exertional cp, hempoptysis, orthopnea pnd or leg swelling.    Also denies any obvious fluctuation of symptoms with weather or environmental changes or other aggravating or alleviating factors.                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 Nl external ear canals without cough reflex  NECK : without JVD/Nodes/TM/ nl carotid upstrokes bilaterally  LUNGS: no acc muscle use, clear to A and P bilaterally without cough on insp or exp maneuvers  CV: RRR no s3 or murmur or increase in P2, no edema  ABD: soft and nontender with nl excursion in the supine position. No bruits or organomegaly, bowel sounds nl  MS: warm without deformities, calf tenderness, cyanosis or clubbing     Assessment & Plan:

## 2010-10-02 NOTE — Patient Instructions (Addendum)
Your condition is called bronchiectasis which is scarring of the bronchial tubes that causes the mucus to stagnate  Please see patient coordinator before you leave today  to schedule sinus ct repeated and if abnormal will refer you to ent  Acid reflux can cause this and needs to be aggressively treated by using protonix Take 30- 60 min before your first and last meals of the day   GERD (REFLUX)  is an extremely common cause of respiratory symptoms, many times with no significant heartburn at all.    It can be treated with medication, but also with lifestyle changes including avoidance of late meals, excessive alcohol, smoking cessation, and avoid fatty foods, chocolate, peppermint, colas, red wine, and acidic juices such as orange juice.  NO MINT OR MENTHOL PRODUCTS SO NO COUGH DROPS  USE SUGARLESS CANDY INSTEAD (jolley ranchers or Stover's)  NO OIL BASED VITAMINS   Please schedule a follow up office visit in 6 weeks, call sooner if needed

## 2010-10-02 NOTE — Assessment & Plan Note (Signed)
Symptoms worse off suppression.   Use of PPI is associated with improved survival time and with decreased radiologic fibrosis per King's study published in AJRCCM vol 184 p1390.  Dec 2011  This may not be cause and effect, but given how universally unhelpful all the otherstudy drugs have been for pf,   recre start ppi and continue working on diet/ lifestyle changes

## 2010-10-05 ENCOUNTER — Telehealth: Payer: Self-pay | Admitting: Internal Medicine

## 2010-10-05 NOTE — Telephone Encounter (Signed)
Pt aware sinus ct is precerted with her ins however she is going to reschedule it until mon 10/09/10 transferred her to rose at ct to change this appt

## 2010-10-09 ENCOUNTER — Ambulatory Visit (INDEPENDENT_AMBULATORY_CARE_PROVIDER_SITE_OTHER)
Admission: RE | Admit: 2010-10-09 | Discharge: 2010-10-09 | Disposition: A | Discharge status: Home / Self Care | Payer: Medicare Other | Source: Ambulatory Visit | Attending: Internal Medicine | Admitting: Internal Medicine

## 2010-10-09 DIAGNOSIS — R05 Cough: Secondary | ICD-10-CM

## 2010-10-24 NOTE — Assessment & Plan Note (Signed)
[facility]                         GASTROENTEROLOGY OFFICE NOTE   NAME:[name]                       MRN:          [id number]  DATE:12/12/2006                            DOB:          March 06, 1941    [name] is a 70 year old white female with irritable bowel syndrome,  currently under good control.  She has incompetent rectal sphincter and  used to have problems with incontinence, but since she has retired and  her stress level has decreased, she has had regular bowel movements and  no accidents.  She comes to refill her prescription for Tranxene and  Lomotil.   MEDICATIONS:  1. Synthroid 0.5 mg daily.  2. Clorazepate 3.75 mg p.r.n.  3. Lomotil 1 to 2 a day.  4. Verapamil.  5. Flexeril.   PHYSICAL EXAM:  Blood pressure 120/66, pulse 60, and weight 191 pounds,  which represents 20-pound weight gain from last visit 2 years ago.  She was alert and oriented in no distress.  LUNGS:  Clear to auscultation.  COR:  Normal S1, normal S2.  ABDOMEN:  Soft.  Nontender.  Normoactive bowel sounds.  RECTAL:  Shows decreased rectal tone.  Stool was Hemoccult negative.  There was an external hemorrhoidal tag, which was an old thrombosed  hemorrhoid, which was scarred and was firm and nodular, but nontender.   IMPRESSION:  A 70 year old white female with irritable bowel syndrome  currently under good control.   PLAN:  A refill for Tranxene and Lomotil.     [name]. Juanda Chance, MD  Electronically Signed    DMB/MedQ  DD: 12/12/2006  DT: 12/12/2006  Job #: [id number]

## 2010-10-27 NOTE — Op Note (Signed)
[facility]  Patient:    [name], [name]                       MRN: [id number] Proc. Date: 07/11/00 Adm. 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  PROCEDURES: 1. Left knee examination under anesthesia, arthroscopy, extensive    chondroplasty of the patellofemoral joint and medial femoral condyle. 2. Partial medial and lateral meniscectomies. 3. Partial synovectomy.  SURGEON:  Loreta Ave, M.D.  ASSISTANT:  Arlys John D. Petrarca, P.A.-C.  ANESTHESIA:  Local with IV sedation.  SPECIMENS:  None.  CULTURES:  None.  COMPLICATIONS:  None.  DRESSINGS:  Soft compressive.  DESCRIPTION OF PROCEDURE:  Patient brought to the operating room and placed on the operating table in supine position.  After adequate anesthesia had been obtained, left knee examined.  Full motion, good stability.  Lateral patellofemoral tracking with crepitus but no marked tethering.  Negative Lachman, negative drawer.  Tourniquet and leg holder applied.  Left prepped and draped in the usual sterile fashion.  Three portals created, one superolateral, one each medial and lateral parapatellar.  Inflow catheter introduced, knee distended, arthroscope introduced, and knee inspected. Patellofemoral tracking was lateral but without tethering, so lateral release not indicated.  Extensive grade 3 change throughout, treated with chondroplasty.  Grade 4 changes lateral facet and  lateral trochlea, treated with chondroplasty as well.  Hypertrophic synovitis debrided.  Loose chondral bodies all debrided.  Medial compartment:  An extensive area of near-full thickness grade 3 change in the middle of the weightbearing dome debrided to a sharp margin and staple surface, removing all flaps and fragments of cartilage.  Degenerative tearing centrally in the posterior two-thirds of the meniscus, saucerized out to a stable margin.  Flap tears off the anterior third saucerized out with hypertrophic synovial tissue below this debrided. No definite meniscal cyst was seen, just hypertrophic inflammatory tissue near the anterior horn.  Cruciate ligaments intact.  The lateral compartment had extensive degenerative tearing over most of the lateral meniscus.  Saucerized out, retaining a little bit of the rim all the way around at completion, tapered in smoothly.  Grade 2 changes debrided.  Some focal grade 3 changes on the condyle very lateral, also debrided.  Entire knee examined, no significant findings appreciated.  Instruments and fluid removed.  Portals and knee injected with Marcaine.  Portals closed with 4-0 nylon.  Sterile compressive dressing applied.  Anesthesia reversed, brought to recovery room.  Tolerated the surgery well with no complications. DD:  07/11/00 TD:  07/11/00 Job: [id number]

## 2010-10-27 NOTE — Op Note (Signed)
NAME:  [name], [name]                ACCOUNT NO.:  [id number]   MEDICAL RECORD NO.:  [id number]          PATIENT TYPE:  AMB   LOCATION:  [facility]                         FACILITY:  [facility]   PHYSICIAN:  Wilmon Arms. Corliss Skains, M.D. DATE OF BIRTH:  Nov 03, 1940   DATE OF PROCEDURE:  04/11/2006  DATE OF DISCHARGE:                                 OPERATIVE REPORT   PREOPERATIVE DIAGNOSIS:  Subcutaneous mass left buttock.   POSTOPERATIVE DIAGNOSIS:  Subcutaneous mass left buttock.   PROCEDURE PERFORMED:  Excision of left buttock mass.   SURGEON:  Wilmon Arms. Tsuei, M.D.   ANESTHESIA:  Local MAC.   INDICATIONS:  The patient is a 70 year old female who presents with a  hardened mass in her left buttock.  This has been present for many years.  Apparently she had an injury several years ago and has had this knot there  for some time.  Lately, it has become more tender and painful.  There is  some purplish discoloration overlying this mass.  She requests excision for  diagnosis.   DESCRIPTION OF PROCEDURE:  The patient is brought to the operating room and  placed on the operating room table with her left side up.  Appropriate  padding was placed.  After appropriate monitors were applied, she was given  some intravenous sedation.  Her left buttock was prepped with Hibiclens and  draped in a sterile fashion.  A total of 25 mL of 0.25% Marcaine with  epinephrine were infiltrated around this mass.  An elliptical incision was  made around the area of discoloration.  The skin flaps were raised  superiorly and inferiorly.  Cautery was used to dissect around the entire  palpable mass.  The deep subcutaneous tissues appeared to be normal.  The  mass was then excised in its entirety and sent for pathologic examination.  The wound was closed with a deep layer of 3-0 Vicryl.  4-0 Monocryl was used  to close the skin in subcuticular fashion.  Steri-Strips and clean dressings  were applied.  The patient was awakened  and brought to the recovery room in  stable condition.  All sponge, instrument, and needle counts were correct.      Wilmon Arms. Tsuei, M.D.  Electronically Signed     MKT/MEDQ  D:  04/11/2006  T:  04/11/2006  Job:  [id number]

## 2010-10-27 NOTE — Assessment & Plan Note (Signed)
[facility]                                   ON-CALL NOTE   NAME:[name], Sylvia                         MRN:          [id number]  DATE:01/11/2006                            DOB:          [birth year]    TIME OF CALL:  6:45 p.m.   CALLER:  The patient.   REASON FOR CALL:  Request medication refill.   This is a patient of Dr. Juanda Chance, who calls requesting Lomotil.  She states  she takes this for a chronic diarrheal problem.  States she contacted the  office a couple of times yesterday and today but the pharmacy has not  received authorization for refill.  She is having no other particular  problems and apparently has irritable bowel.  Will refill her prescription  for Lomotil 100 that she has had previous.                                   Wilhemina Bonito. Eda Keys., MD   JNP/MedQ  DD:  01/11/2006  DT:  01/12/2006  Job #:  [id number]   cc:   Hedwig Morton. Juanda Chance, MD

## 2010-11-02 ENCOUNTER — Telehealth: Payer: Self-pay | Admitting: Internal Medicine

## 2010-11-02 NOTE — Telephone Encounter (Signed)
The pt states her pharmacist told her that she could not take the pantoprazole and the synthroid at the same time and would need to take these about 1 hour apart. [name] said she is also afraid the pantoprazole will cause her to have diarrhea. Pls advise if there is any reason pt cannot take these medications at the same time.

## 2010-11-02 NOTE — Telephone Encounter (Signed)
Spoke with pt and notified of recs per Dr Sherene Sires. Pt verbalized understanding.

## 2010-11-02 NOTE — Telephone Encounter (Signed)
No reason not to take both about 30-60 min before eating - if any abd complaints from protonix develop after taking it then they could be due to rarely reported and not serious side effects vs potential benefit

## 2010-11-13 ENCOUNTER — Ambulatory Visit: Payer: Medicare Other | Admitting: Internal Medicine

## 2010-12-01 ENCOUNTER — Ambulatory Visit: Payer: Medicare Other | Admitting: Internal Medicine

## 2011-01-01 ENCOUNTER — Ambulatory Visit (INDEPENDENT_AMBULATORY_CARE_PROVIDER_SITE_OTHER): Payer: Medicare Other | Admitting: Internal Medicine

## 2011-01-01 ENCOUNTER — Encounter: Payer: Self-pay | Admitting: Internal Medicine

## 2011-01-01 ENCOUNTER — Telehealth: Payer: Self-pay | Admitting: Internal Medicine

## 2011-01-01 VITALS — BP 136/78 | HR 84 | Temp 97.4°F | Ht 66.0 in | Wt 184.2 lb

## 2011-01-01 DIAGNOSIS — J310 Chronic rhinitis: Secondary | ICD-10-CM

## 2011-01-01 DIAGNOSIS — J8410 Pulmonary fibrosis, unspecified: Secondary | ICD-10-CM

## 2011-01-01 MED ORDER — HYDROCODONE-ACETAMINOPHEN 5-500 MG PO TABS: 1.0000 | ORAL_TABLET | Freq: Four times a day (QID) | ORAL | Status: DC | PRN

## 2011-01-01 MED ORDER — MONTELUKAST SODIUM 10 MG PO TABS: 10.0000 mg | ORAL_TABLET | Freq: Every day | ORAL | Status: DC

## 2011-01-01 MED ORDER — MOXIFLOXACIN HCL 400 MG PO TABS: 400.0000 mg | ORAL_TABLET | Freq: Every day | ORAL | Status: AC

## 2011-01-01 MED ORDER — MOMETASONE FUROATE 50 MCG/ACT NA SUSP: 2.0000 | Freq: Every day | NASAL | Status: DC

## 2011-01-01 NOTE — Assessment & Plan Note (Signed)
Now has  Classic Upper airway cough syndrome, so named because it's frequently impossible to sort out how much is  CR/sinusitis with freq throat clearing (which can be related to primary GERD)   vs  causing  secondary (" extra esophageal")  GERD from wide swings in gastric pressure that occur with throat clearing, often  promoting self use of mint and menthol lozenges that reduce the lower esophageal sphincter tone and exacerbate the problem further in a cyclical fashion.   These are the same pts who not infrequently have failed to tolerate ace inhibitors,  dry powder inhalers or biphosphonates or report having reflux symptoms that don't respond to standard doses of PPI , and are easily confused as having aecopd or asthma flares,   For now rx as rhinitis/ sinusitis and if not better ct sinus, then ent vs referral to [facility]

## 2011-01-01 NOTE — Assessment & Plan Note (Signed)
Very benign exam and cxr, probably most of her symptoms are upper airway though needs pft's and cxr in 02/2011 unless goes to [facility] for f/u (placed in tickle file for recall)

## 2011-01-01 NOTE — Patient Instructions (Addendum)
I emphasized that nasal steroids (nasonex) have no immediate benefit in terms of improving symptoms.  To help them reached the target tissue, the patient should use Afrin two puffs every 12 hours applied one min before using the nasal steroids.  Afrin should be stopped after no more than 5 days.  If the symptoms worsen, Afrin can be restarted after 5 days off of therapy to prevent rebound congestion from overuse of Afrin.  I also emphasized that in no way are nasal steroids a concern in terms of "addiction".  Avelox x 10 days then call for CT Sinus if not happy with results Almyra Free [phone]) Singulair 10 mg one daily in evening Nasonex 2 puffs every 12 hours with Afrin x 5 days  Pepcid 20 mg at bedtime instead of the evening dose of protonix If improved stay on the singulair and nasonex and see me back in 6 weeks.

## 2011-01-01 NOTE — Progress Notes (Signed)
Subjective:    Patient ID: [name], female    DOB: [birth year], 70 y.o.   MRN: [id number]  HPI    Primary Provider/Referring Provider: Dr. Marinda [name]     69 yowf quit smoking in the 1980s with h/o upper allergies in the 1960s dx as allergies by Dr [name] to mold , mildrew and grass but no chronic lower resp complaints p quit smoking.  July 10, 2010 1st pulmonary office eval with chronic cough esp x 3 years seems worse after xmas and early in am > clear mucus sometimes green, severity of cough worse since Fall of 2011. use cough drops. cough better with hydrocodone . does not wake up coughing. sob with activity esp hills but mostly worse sob with coughing fits. assoc with nasal congestion also.  GERD (REFLUX) diet  Elevate the head of bed if you can  Pepcid 20 mg one at bedtime  Best choice for cough medications is mucinex dm  Please schedule a follow-up appointment in 6 weeks, sooner if needed with PFT's and CXR.   August 23, 2010 ov cc overall no change cough or doe but having overt hb symptoms now despite pepcid. rec change to hs zegrid, insurance declined, cough worse off rx sinus ct pos acute sinusitis rx with 10 day avelox , transiently better. No desats x 3 laps  10/02/2010 ov/[name]  Cough and sob worse off ppi. No purulent sputum but lots of am mucoid production.  Your condition is called bronchiectasis which is scarring of the bronchial tubes that causes the mucus to stagnate  Please see patient coordinator before you leave today  to schedule sinus ct repeated and if abnormal will refer you to ent  Acid reflux can cause this and needs to be aggressively treated by using protonix Take 30- 60 min before your first and last meals of the day   GERD (REFLUX) diet    01/01/2011 ov/[name] cc some better p avelox but now  cough worse x sev weeks mostly white mucus 24 hours per day. Having diarrhea from protonix, can't remember to take the second dose. Feeling can't get a full breath  now.  Pt denies any significant sore throat, dysphagia, itching, sneezing,  nasal congestion or excess/ purulent secretions,  fever, chills, sweats, unintended wt loss, lateralizing pleuritic or exertional cp, hempoptysis, orthopnea pnd or leg swelling.    Also denies any obvious fluctuation of symptoms with weather or environmental changes or other aggravating or alleviating factors.              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)  IRRITABLE BOWEL SYNDROME (ICD-564.1)  Hx of DIARRHEA, CHRONIC (ICD-787.91)  ILD/Bronchiectasis  - PFT's August 23, 2010 VC 82% DLCO 62 > 129 corrected   - Alpha one sent 10/02/2010 >>> Chronic rhinitis  - Sinus CT ordered August 23, 2010 > Pos L sinusitis Rx Avelox x 10 days > repeat ordered 10/02/2010  - Allergy profile sent 08/23/10 > IgE 21 neg profile                    Review of Systems     Objective:   Physical Exam      wt 182 > 188 July 10, 2010 > 186 August 23, 2010  > 190 10/02/2010 > 184 01/01/2011  amb wf nad with harsh barking upper airway pattern cough and nasal tone to voice HEENT: nl dentition, turbinates, and orophanx. Nl external ear canals  without cough reflex  NECK : without JVD/Nodes/TM/ nl carotid upstrokes bilaterally  LUNGS: no acc muscle use, clear to A and P bilaterally without cough on insp or exp maneuvers  CV: RRR no s3 or murmur or increase in P2, no edema  ABD: soft and nontender with nl excursion in the supine position. No bruits or organomegaly, bowel sounds nl  MS: warm without deformities, calf tenderness, cyanosis or clubbing     Assessment & Plan:

## 2011-01-02 ENCOUNTER — Ambulatory Visit: Payer: Medicare Other | Admitting: Adult Health

## 2011-01-02 NOTE — Telephone Encounter (Signed)
Document was accidentally signed. Therefore, I had to create an addendum.  Verlon Au, Did you call Annabelle Harman with Alpha [facility]. I don't see anything in pt's chart. Please advise.  Thanks!

## 2011-01-02 NOTE — Telephone Encounter (Signed)
[name]. Did you call

## 2011-01-05 ENCOUNTER — Telehealth: Payer: Self-pay | Admitting: Internal Medicine

## 2011-01-05 NOTE — Telephone Encounter (Signed)
Spoke with pt to advise neg alpha 1 screen. Pt verbalized understanding. States that she is afraid to take the avelox that was prescribed to her at last ov b/c she is still having diarrhea. She states that she related the diarrhea to taking protonix. I advised will forward msg to MW for recs and in the meantime take immodium. Pt verbalized understanding.

## 2011-01-05 NOTE — Telephone Encounter (Signed)
See previous note re rx with flagyl

## 2011-01-05 NOTE — Telephone Encounter (Signed)
Ok, give flagyl 500 mg twice daily x 10 days instead, this may help sinuses and definitely will help diarrhea but can't drink any alcohol with it.  Regroup in 10 days in ov or consider referral to ent if not improving

## 2011-01-05 NOTE — Telephone Encounter (Signed)
[email or web address] 

## 2011-01-09 MED ORDER — METRONIDAZOLE 500 MG PO TABS: 500.0000 mg | ORAL_TABLET | Freq: Two times a day (BID) | ORAL | Status: AC

## 2011-01-09 NOTE — Telephone Encounter (Signed)
Patient returning call. [phone]

## 2011-01-09 NOTE — Telephone Encounter (Signed)
Spoke with pt and notified of recs per MW. She verbalized understanding and denied any further questions. Rx for flagyl was sent to pharm.

## 2011-01-11 ENCOUNTER — Encounter: Payer: Self-pay | Admitting: Internal Medicine

## 2011-01-17 ENCOUNTER — Encounter: Payer: Self-pay | Admitting: Internal Medicine

## 2011-01-18 ENCOUNTER — Telehealth: Payer: Self-pay | Admitting: Internal Medicine

## 2011-01-18 NOTE — Telephone Encounter (Signed)
Spoke with pt and sched her to see MR on 02/21/11 at 3 pm.

## 2011-01-18 NOTE — Telephone Encounter (Signed)
Dr. Delford Field, pls advise if you are ok with the switch or if you would prefer MR or RA see pt if she agrees.  Thanks!

## 2011-01-18 NOTE — Telephone Encounter (Signed)
MR, pls advise, thanks

## 2011-01-18 NOTE — Telephone Encounter (Signed)
i would try [facility]

## 2011-01-18 NOTE — Telephone Encounter (Signed)
Spoke with pt and notified that we will have to speak with both Drs Sherene Sires and Delford Field regarding changing physicians. She verbalized understanding. Dr Sherene Sires, please advise, thanks!

## 2011-01-18 NOTE — Telephone Encounter (Signed)
Fine with me

## 2011-01-18 NOTE — Telephone Encounter (Signed)
Fine with me though I don't believe Dr Delford Field is accepting pts in this situation - probably best fit is Ramaswamy or Vassie Loll

## 2011-01-23 ENCOUNTER — Telehealth: Payer: Self-pay | Admitting: Internal Medicine

## 2011-01-23 NOTE — Telephone Encounter (Signed)
Is it possible for me to see this patient before 02/21/11  Thanks MR

## 2011-01-26 NOTE — Telephone Encounter (Signed)
Pt returned call from [employer].

## 2011-01-26 NOTE — Telephone Encounter (Signed)
[email or web address]. Teanna Elem, CMA   

## 2011-01-26 NOTE — Telephone Encounter (Signed)
The pt cancelled the 02-21-11 appt and you are completely booked with 10 plus on each day before that. Do I need to call her to r/s?Carron Curie, CMA

## 2011-01-26 NOTE — Telephone Encounter (Signed)
Why did she cancel 02/21/11 appt ? YOu can get her in before that as long as daily max is </= 12 patients.

## 2011-01-29 NOTE — Telephone Encounter (Signed)
[email or web address].Jennifer Castillo, CMA  

## 2011-01-30 NOTE — Telephone Encounter (Signed)
Pt saw Dr. Jethro Bolus instead and does not want to set an appt here. [name], CMA

## 2011-02-13 ENCOUNTER — Ambulatory Visit: Payer: Medicare Other | Admitting: Internal Medicine

## 2011-02-21 ENCOUNTER — Institutional Professional Consult (permissible substitution): Payer: Medicare Other | Admitting: Internal Medicine

## 2011-03-22 ENCOUNTER — Other Ambulatory Visit: Payer: Self-pay | Admitting: Internal Medicine

## 2011-03-22 MED ORDER — CLORAZEPATE DIPOTASSIUM 3.75 MG PO TABS: 3.7500 mg | ORAL_TABLET | Freq: Three times a day (TID) | ORAL | Status: DC

## 2011-03-22 NOTE — Telephone Encounter (Signed)
rx sent

## 2011-10-11 ENCOUNTER — Other Ambulatory Visit: Payer: Self-pay | Admitting: Internal Medicine

## 2011-10-11 MED ORDER — CLORAZEPATE DIPOTASSIUM 3.75 MG PO TABS: 3.7500 mg | ORAL_TABLET | Freq: Three times a day (TID) | ORAL | Status: DC

## 2011-10-11 NOTE — Telephone Encounter (Signed)
I have advised patient that I will be glad to send her a 1 month supply of clorazepate, however she needs an office visit for any further refills (she has not been seen in the office since 10/2009). Patient verbalizes understanding.

## 2011-12-12 ENCOUNTER — Telehealth: Payer: Self-pay | Admitting: Internal Medicine

## 2011-12-12 NOTE — Telephone Encounter (Signed)
Patient advised that she was asked to make an appointment on 10-11-11 when we agreed to give her 1 month supply until she could be seen. Advised that since she has not been seen in over 2 years that Dr Juanda Chance may not fill the medication but that I would ask if she would be willing to do so. She states that she might have enough medicine to last until her appointment and will call back if she does not so I can send a note to Dr Juanda Chance. Patient states that a couple months back she called Dr Juanda Chance at home and she gave her refills but she forgot to make an appointment for follow up.

## 2011-12-12 NOTE — Telephone Encounter (Signed)
Dr Cresenciano Genre called back. She needs a new script for clorazepate. Are you okay with me sending this?

## 2011-12-14 MED ORDER — CLORAZEPATE DIPOTASSIUM 3.75 MG PO TABS: 3.7500 mg | ORAL_TABLET | Freq: Three times a day (TID) | ORAL | Status: DC

## 2011-12-14 NOTE — Telephone Encounter (Signed)
1 more refill, no more after that ----- Message ----- From: Richardson Chiquito, CMA Sent: 12/12/2011 4:49 PM To: Hart Carwin, MD   I have spoken to patient and have advised her of Dr Regino Schultze recommendation. Patient verbalizes understanding.

## 2011-12-14 NOTE — Addendum Note (Signed)
Addended by: Richardson Chiquito on: 12/14/2011 12:13 PM   Modules accepted: Orders

## 2011-12-24 ENCOUNTER — Encounter: Payer: Self-pay | Admitting: *Deleted

## 2012-01-11 ENCOUNTER — Encounter: Payer: Self-pay | Admitting: Internal Medicine

## 2012-01-11 ENCOUNTER — Ambulatory Visit (INDEPENDENT_AMBULATORY_CARE_PROVIDER_SITE_OTHER): Payer: Medicare Other | Admitting: Internal Medicine

## 2012-01-11 VITALS — BP 102/68 | HR 83 | Ht 66.0 in | Wt 176.4 lb

## 2012-01-11 DIAGNOSIS — K589 Irritable bowel syndrome without diarrhea: Secondary | ICD-10-CM

## 2012-01-11 DIAGNOSIS — R197 Diarrhea, unspecified: Secondary | ICD-10-CM

## 2012-01-11 MED ORDER — CLORAZEPATE DIPOTASSIUM 3.75 MG PO TABS: 3.7500 mg | ORAL_TABLET | Freq: Three times a day (TID) | ORAL | Status: DC

## 2012-01-11 MED ORDER — DIPHENOXYLATE-ATROPINE 2.5-0.025 MG PO TABS: 1.0000 | ORAL_TABLET | Freq: Three times a day (TID) | ORAL | Status: DC | PRN

## 2012-01-11 NOTE — Patient Instructions (Addendum)
We have sent the following medications to your pharmacy for you to pick up at your convenience: Lomotil Clorazepate (will send 6 additional refills after first 6 months of rx runs out) CC: Dr Catha Gosselin

## 2012-01-11 NOTE — Progress Notes (Signed)
[name] 1940-09-28 MRN [id number]   History of Present Illness:  This is a 71 year old white female with irritable bowel syndrome with predominant diarrhea. Her last appointment was in May 2011. Since she has retired, her diarrhea has subsided. She used to have fecal urgency and incontinence but she has been doing much better in that respect. She still takes Tranxene 3.75 mg 3 times a day and occasionally Lomotil. Her last colonoscopy was in August 2011 and it showed a hyperplastic polyp. Random biopsies showed no evidence of microscopic colitis. Prior colonoscopies were in 1990 in 1995. She has a history of remote cholecystectomy. There is no family history of colon cancer. Her sister had colon polyps.    Past Medical History  Diagnosis Date  . Stenosis of rectum and anus   . IBS (irritable bowel syndrome)   . Chronic diarrhea   . ILD (interstitial lung disease)   . Bronchiectasis     PFTs August 23, 2010 VC 82%   dlco 62 > 129 CORRECTED  . Chronic rhinitis     sinus CT ordered August 23, 2010  . Hyperplastic colon polyp   . Diabetes mellitus    Past Surgical History  Procedure Date  . Hand surgery 1991    left hand; Baker's Cyst removed  . Dilation and curettage of uterus   . Cholecystectomy   . Tear duct surgery     reports that she quit smoking about 35 years ago. Her smoking use included Cigarettes. She has a 45 pack-year smoking history. She has never used smokeless tobacco. She reports that she does not drink alcohol or use illicit drugs. family history includes Colon polyps in her sister; Diabetes in her sister; and Heart disease in her mother.  There is no history of Colon cancer. Allergies  Allergen Reactions  . Clarithromycin   . Cortisone   . Penicillins   . Povidone         Review of Systems: Denies heartburn dysphagia chest pain or shortness of breath  The remainder of the 10 point ROS is negative except as outlined in H&P   Physical Exam: General  appearance  Well developed, in no distress. Eyes- non icteric. HEENT nontraumatic, normocephalic. Mouth no lesions, tongue papillated, no cheilosis. Neck supple without adenopathy, thyroid not enlarged, no carotid bruits, no JVD. Lungs Clear to auscultation bilaterally. Cor normal S1, normal S2, regular rhythm, no murmur,  quiet precordium. Abdomen: Soft mildly protuberant abdomen with normal active bowel sounds. Mild tenderness in left lower quadrant. No palpable mass. No distention. Liver edge at costal margin. Rectal: Soft Hemoccult negative stool. Extremities no pedal edema. Skin no lesions. Neurological alert and oriented x 3. Psychological normal mood and affect.  Assessment and Plan:  Irritable bowel syndrome with predominant diarrhea under good control since the patient has  retired. We will refill her Tranxene and Lomotil and will see her as needed. She is up-to-date on her colonoscopy.   01/11/2012 Lina Sar

## 2012-01-12 ENCOUNTER — Encounter: Payer: Self-pay | Admitting: Internal Medicine

## 2012-10-01 ENCOUNTER — Other Ambulatory Visit: Payer: Self-pay | Admitting: Internal Medicine

## 2013-04-04 ENCOUNTER — Other Ambulatory Visit: Payer: Self-pay | Admitting: Internal Medicine

## 2013-04-06 NOTE — Telephone Encounter (Signed)
Message copied by Donata Duff on Mon Apr 06, 2013 10:34 AM ------      Message from: Richardson Chiquito      Created: Mon Apr 06, 2013  8:09 AM       Alexia Freestone, will you look in my med refills for brodie and refill this patient's clorazepate? Give her #90 with 3 refills. It will print out so you will have to stamp brodie's name (which is the only reason I cant do it). Thank you :) ------

## 2013-04-06 NOTE — Telephone Encounter (Signed)
rx has been sent 

## 2013-10-29 ENCOUNTER — Encounter: Payer: Self-pay | Admitting: Internal Medicine

## 2013-10-29 ENCOUNTER — Ambulatory Visit (INDEPENDENT_AMBULATORY_CARE_PROVIDER_SITE_OTHER): Payer: Medicare Other | Admitting: Internal Medicine

## 2013-10-29 ENCOUNTER — Ambulatory Visit (INDEPENDENT_AMBULATORY_CARE_PROVIDER_SITE_OTHER): Payer: Medicare Other

## 2013-10-29 VITALS — BP 102/68 | HR 70 | Temp 97.7°F | Ht 66.0 in | Wt 186.2 lb

## 2013-10-29 DIAGNOSIS — J8410 Pulmonary fibrosis, unspecified: Secondary | ICD-10-CM

## 2013-10-29 LAB — SEDIMENTATION RATE: SED RATE: 61 mm/h — AB (ref 0–22)

## 2013-10-29 LAB — CK: CK TOTAL: 75 U/L (ref 7–177)

## 2013-10-29 NOTE — Patient Instructions (Addendum)
Cough likely due to nasal drainage, and Interstitial Lung Disease (ILD) Let us focus on ILD first  #Interstitial Lung Disease  - need to determine type and severity  -do PFT breathing test  - do walking desaturation test in office  -do ONO test on room air at home  - do High Resolution CT chest without contrast on ILD protocol. Only  Dr Lorin Picket or Dr. Vinnie Langton to read - do blood: : ESR, ANA, DS-DNA, RF, anti-CCP, ssA, ssB, scl-70, ANCA screen, PR-3, MPO antibodies, Total CK,  Aldolase,  Hypersensitivity Pneumonitis Panel   #Followup   - return < 2 weeks after completing all of above

## 2013-10-29 NOTE — Progress Notes (Signed)
Subjective:    Patient ID: [name], female    DOB: 1941/01/01, 73 y.o.   MRN: [id number] PCP Gennette Pac, MD  HPI   73 year old female accompanied by her husband. Referred for chronic cough and evaluation for potential interstitial lung disease  Reports insidious onset of chronic cough 5 years ago. It is present on a daily constant basis. It has an up-and-down course depending on viral exacerbations. There is also generally progressive course particularly in the last several weeks to a few months. Cough is present mostly in the daytime. It is rarely present at night. Cough is mostly dry but there is an associated white mucus production. Cough is rated as severe in the last several months but otherwise it was moderate in intensity.   Sinus history: She does have chronic postnasal drainage for which he takes antihistamine as needed. She does not do a nasal steroid. She used to be followed by Dr. 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  Exposure history: She lived [place] log cabin for [place] years up until one year ago. Apparently this log  had a lot of mold. Otherwise history is negative for asbestos, cobalt, cold, other organic antigens, bleomycin, amiodarone, nitrofurantoin and exposures  Socio Cultural Values: anxious to find out dx. Social values - averse to lot of meds due to fear of ADR   Tests  PFT FVC fev1 ratio BD fev1 TLC DLCO Walking desat test 185 ft x 3 laps  March 2012 xL/82% x x%  x x/62% Did not desat on RA  May 2015       Desaturated on 2nd lap to 84% on RA       Past Medical History  Diagnosis Date  . Stenosis of rectum and anus   . IBS (irritable bowel syndrome)   . Chronic diarrhea   . ILD  (interstitial lung disease)   . Bronchiectasis     PFTs August 23, 2010 VC 82%   dlco 62 > 129 CORRECTED  . Chronic rhinitis     sinus CT ordered August 23, 2010  . Hyperplastic colon polyp   . Diabetes mellitus      Family History  Problem Relation Age of Onset  . Colon polyps Sister   . Diabetes Sister   . Heart disease Mother   . Colon cancer Neg Hx      History   Social History  . Marital Status: Married    Spouse Name: N/A    Number of Children: N/A  . Years of Education: N/A   Occupational History  . retired    Social History Main Topics  . Smoking status: Former Smoker -- 3.00 packs/day for 15 years    Types: Cigarettes    Quit date: 06/11/1976  . Smokeless tobacco: Never Used  . Alcohol Use: No  . Drug Use: No  . Sexual Activity: Not on file   Other Topics Concern  . Not on file   Social History Narrative   Daily caffeine use     Allergies  Allergen Reactions  . Clarithromycin   . Cortisone   . Penicillins   . Povidone-Iodine      Outpatient Prescriptions Prior to Visit  Medication  Sig Dispense Refill  . Ascorbic Acid (VITAMIN C) 500 MG tablet Take 500 mg by mouth daily.        . Biotin 1000 MCG tablet Take 1,000 mcg by mouth daily.        . Calcium Carbonate-Vitamin D (CALCIUM 600-D) 600-400 MG-UNIT per tablet Take 1 tablet by mouth 2 (two) times daily.        . cyclobenzaprine (FLEXERIL) 10 MG tablet 1/2 - 1 tablet by mouth at bedtime        . diphenoxylate-atropine (LOMOTIL) 2.5-0.025 MG per tablet Take 1 tablet by mouth 3 (three) times daily as needed for diarrhea or loose stools.  100 tablet  10  . glucosamine-chondroitin 500-400 MG tablet Take 1 tablet by mouth 2 (two) times daily.        [name] HYDROcodone-acetaminophen (VICODIN) 5-500 MG per tablet Take 1 tablet by mouth every 6 (six) hours as needed.  30 tablet  0  . ibuprofen (ADVIL,MOTRIN) 200 MG tablet 2 tabs by mouth at bedtime        . levothyroxine (SYNTHROID, LEVOTHROID) 88 MCG tablet  Take 88 mcg by mouth daily.        . Multiple Vitamin (MULTIVITAMIN) tablet Take 1 tablet by mouth daily.        . pioglitazone (ACTOS) 15 MG tablet Take 15 mg by mouth daily.      . pravastatin (PRAVACHOL) 10 MG tablet Take 10 mg by mouth daily.      . verapamil (CALAN) 120 MG tablet 1/2 tab by mouth twice daily        . clorazepate (TRANXENE) 3.75 MG tablet TAKE 1 TABLET 3 TIMES A DAY  90 tablet  3  . mometasone (NASONEX) 50 MCG/ACT nasal spray Place 2 sprays into the nose daily.  17 g  2  . montelukast (SINGULAIR) 10 MG tablet Take 1 tablet (10 mg total) by mouth at bedtime.  30 tablet  2  . pantoprazole (PROTONIX) 40 MG tablet Take 30- 60 min before your first and last meals of the day  60 tablet  2   No facility-administered medications prior to visit.      Review of Systems  Constitutional: Negative for fever and unexpected weight change.  HENT: Negative for congestion, dental problem, ear pain, nosebleeds, postnasal drip, rhinorrhea, sinus pressure, sneezing, sore throat and trouble swallowing.   Eyes: Negative for redness and itching.  Respiratory: Positive for cough. Negative for chest tightness, shortness of breath and wheezing.   Cardiovascular: Negative for palpitations and leg swelling.  Gastrointestinal: Negative for nausea and vomiting.  Genitourinary: Negative for dysuria.  Musculoskeletal: Negative for joint swelling.  Skin: Negative for rash.  Neurological: Negative for headaches.  Hematological: Does not bruise/bleed easily.  Psychiatric/Behavioral: Negative for dysphoric mood. The patient is not nervous/anxious.    Sch     Objective:   Physical Exam  Vitals reviewed. Constitutional: She is oriented to person, place, and time. She appears well-developed and well-nourished. No distress.  HENT:  Head: Normocephalic and atraumatic.  Right Ear: External ear normal.  Left Ear: External ear normal.  Mouth/Throat: Oropharynx is clear and moist. No oropharyngeal  exudate.  Eyes: Conjunctivae and EOM are normal. Pupils are equal, round, and reactive to light. Right eye exhibits no discharge. Left eye exhibits no discharge. No scleral icterus.  Neck: Normal range of motion. Neck supple. No JVD present. No tracheal deviation present. No thyromegaly present.  Cardiovascular: Normal rate, regular rhythm, normal heart sounds and  intact distal pulses.  Exam reveals no gallop and no friction rub.   No murmur heard. Pulmonary/Chest: Effort normal. No respiratory distress. She has no wheezes. She has rales. She exhibits no tenderness.  Basal crackles+   Abdominal: Soft. Bowel sounds are normal. She exhibits no distension and no mass. There is no tenderness. There is no rebound and no guarding.  Musculoskeletal: Normal range of motion. She exhibits no edema and no tenderness.  Lymphadenopathy:    She has no cervical adenopathy.  Neurological: She is alert and oriented to person, place, and time. She has normal reflexes. No cranial nerve deficit. She exhibits normal muscle tone. Coordination normal.  Skin: Skin is warm and dry. No rash noted. She is not diaphoretic. No erythema. No pallor.  Psychiatric: She has a normal mood and affect. Her behavior is normal. Judgment and thought content normal.   Filed Vitals:   10/29/13 1533  Height: 5\' 6"  (1.676 m)  Weight: 186 lb 3.2 oz (84.46 kg)    Filed Vitals:   10/29/13 1533  BP: 102/68  Pulse: 70  Temp: 97.7 F (36.5 C)  TempSrc: Oral  Height: 5\' 6"  (1.676 m)  Weight: 186 lb 3.2 oz (84.46 kg)  SpO2: 100%         Assessment & Plan:

## 2013-10-30 ENCOUNTER — Ambulatory Visit (INDEPENDENT_AMBULATORY_CARE_PROVIDER_SITE_OTHER): Payer: Medicare Other | Admitting: Internal Medicine

## 2013-10-30 DIAGNOSIS — J8410 Pulmonary fibrosis, unspecified: Secondary | ICD-10-CM

## 2013-10-30 LAB — PULMONARY FUNCTION TEST
DL/VA % PRED: 91 %
DL/VA: 4.48 ml/min/mmHg/L
DLCO UNC: 15.85 ml/min/mmHg
DLCO unc % pred: 61 %
FEF 25-75 Post: 3.1 L/sec
FEF 25-75 Pre: 2.74 L/sec
FEF2575-%Change-Post: 13 %
FEF2575-%PRED-PRE: 149 %
FEF2575-%Pred-Post: 169 %
FEV1-%Change-Post: 2 %
FEV1-%PRED-PRE: 89 %
FEV1-%Pred-Post: 92 %
FEV1-Post: 2.1 L
FEV1-Pre: 2.05 L
FEV1FVC-%Change-Post: 2 %
FEV1FVC-%Pred-Pre: 113 %
FEV6-%CHANGE-POST: 0 %
FEV6-%Pred-Post: 83 %
FEV6-%Pred-Pre: 83 %
FEV6-PRE: 2.41 L
FEV6-Post: 2.4 L
FEV6FVC-%Change-Post: 0 %
FEV6FVC-%Pred-Post: 105 %
FEV6FVC-%Pred-Pre: 104 %
FVC-%CHANGE-POST: 0 %
FVC-%PRED-POST: 79 %
FVC-%Pred-Pre: 79 %
FVC-POST: 2.4 L
FVC-Pre: 2.41 L
POST FEV1/FVC RATIO: 87 %
POST FEV6/FVC RATIO: 100 %
PRE FEV1/FVC RATIO: 85 %
Pre FEV6/FVC Ratio: 100 %
RV % pred: 51 %
RV: 1.19 L
TLC % PRED: 73 %
TLC: 3.81 L

## 2013-10-30 LAB — MPO/PR-3 (ANCA) ANTIBODIES
Myeloperoxidase Abs: <1
Serine Protease 3: <1

## 2013-10-30 LAB — ANTI-DNA ANTIBODY, DOUBLE-STRANDED: ds DNA Ab: 2 IU/mL

## 2013-10-30 LAB — ANCA SCREEN W REFLEX TITER
Atypical p-ANCA Screen: NEGATIVE
C-ANCA SCREEN: NEGATIVE
p-ANCA Screen: POSITIVE — AB

## 2013-10-30 LAB — ANTI-NUCLEAR AB-TITER (ANA TITER): ANA Titer 1: 1:160 {titer} — ABNORMAL HIGH

## 2013-10-30 LAB — ANCA TITERS: P-ANCA: 1:640 {titer} — ABNORMAL HIGH

## 2013-10-30 LAB — CYCLIC CITRUL PEPTIDE ANTIBODY, IGG: Cyclic Citrullin Peptide Ab: <2 U/mL (ref 0.0–5.0)

## 2013-10-30 LAB — SJOGRENS SYNDROME-A EXTRACTABLE NUCLEAR ANTIBODY: SSA (Ro) (ENA) Antibody, IgG: <1

## 2013-10-30 LAB — SJOGRENS SYNDROME-B EXTRACTABLE NUCLEAR ANTIBODY: SSB (La) (ENA) Antibody, IgG: <1

## 2013-10-30 LAB — ANA: Anti Nuclear Antibody(ANA): POSITIVE — AB

## 2013-10-30 LAB — ANTI-SCLERODERMA ANTIBODY: SCLERODERMA (SCL-70) (ENA) ANTIBODY, IGG: NEGATIVE

## 2013-10-30 LAB — RHEUMATOID FACTOR

## 2013-10-30 NOTE — Progress Notes (Signed)
PFT done today. 

## 2013-10-31 LAB — ALDOLASE: Aldolase: 4.7 U/L (ref <=8.1)

## 2013-11-02 NOTE — Assessment & Plan Note (Signed)
Cough likely due to nasal drainage, and Interstitial Lung Disease (ILD) Let us focus on ILD first which might be progressive based on walking desaturation test (2012-> 2015)  #Interstitial Lung Disease  - need to determine type and severity  -do PFT breathing test  - do walking desaturation test in office  -do ONO test on room air at home  - do High Resolution CT chest without contrast on ILD protocol. Only  Dr Lorin Picket or Dr. Vinnie Langton to read - do blood: : ESR, ANA, DS-DNA, RF, anti-CCP, ssA, ssB, scl-70, ANCA screen, PR-3, MPO antibodies, Total CK,  Aldolase,  Hypersensitivity Pneumonitis Panel   #Followup   - return < 2 weeks after completing all of above

## 2013-11-03 NOTE — Progress Notes (Signed)
Quick Note:  Called and spoke to pt regarding results per MR. Pt verbalized understanding and denied any further questions or concerns at this time. ______

## 2013-11-04 ENCOUNTER — Ambulatory Visit (INDEPENDENT_AMBULATORY_CARE_PROVIDER_SITE_OTHER)
Admission: RE | Admit: 2013-11-04 | Discharge: 2013-11-04 | Disposition: A | Discharge status: Home / Self Care | Payer: Medicare Other | Source: Ambulatory Visit | Attending: Internal Medicine | Admitting: Internal Medicine

## 2013-11-04 DIAGNOSIS — J8410 Pulmonary fibrosis, unspecified: Secondary | ICD-10-CM

## 2013-11-05 LAB — HYPERSENSITIVITY PNUEMONITIS PROFILE

## 2013-11-13 ENCOUNTER — Telehealth: Payer: Self-pay | Admitting: Internal Medicine

## 2013-11-13 NOTE — Telephone Encounter (Signed)
On 11/03/13 shows <= 89% at 42min 8sec but </= 88% at 77min 28 sec. Will discuss with her 11/18/13 OV about o2 need at night.

## 2013-11-18 ENCOUNTER — Encounter: Payer: Self-pay | Admitting: Internal Medicine

## 2013-11-18 ENCOUNTER — Ambulatory Visit (INDEPENDENT_AMBULATORY_CARE_PROVIDER_SITE_OTHER): Payer: Medicare Other | Admitting: Internal Medicine

## 2013-11-18 VITALS — BP 118/62 | HR 95 | Ht 65.0 in | Wt 186.2 lb

## 2013-11-18 DIAGNOSIS — J849 Interstitial pulmonary disease, unspecified: Secondary | ICD-10-CM

## 2013-11-18 DIAGNOSIS — J8410 Pulmonary fibrosis, unspecified: Secondary | ICD-10-CM

## 2013-11-18 MED ORDER — BENZONATATE 200 MG PO CAPS: 200.0000 mg | ORAL_CAPSULE | Freq: Three times a day (TID) | ORAL | Status: DC | PRN

## 2013-11-18 NOTE — Patient Instructions (Addendum)
#  ILD  - you have interstitial lung diease  -Severity is Mild to Moderate - Progression: possible and likely mild progression since 2012 - Causes: IPF v Autoimmune ILD v Hypersensitivity Pneumonitis. 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

## 2013-11-18 NOTE — Progress Notes (Signed)
Subjective:    Patient ID: [name], female    DOB: 11-22-1940, 73 y.o.   MRN: [id number]  HPI  PCP Gennette Pac, MD  HPI   73 year old female accompanied by her husband. Referred for chronic cough and evaluation for potential interstitial lung disease  Reports insidious onset of chronic cough 5 years ago. It is present on a daily constant basis. It has an up-and-down course depending on viral exacerbations. There is also generally progressive course particularly in the last several weeks to a few months. Cough is present mostly in the daytime. It is rarely present at night. Cough is mostly dry but there is an associated white mucus production. Cough is rated as severe in the last several months but otherwise it was moderate in intensity.   Sinus history: She does have chronic postnasal drainage for which he takes antihistamine as needed. She does not do a nasal steroid. She used to be followed by Dr. 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  Exposure history: She lived in a log cabin for 7 years up until one year ago. Apparently this log  had a lot of mold. There was also mice here Otherwise history is negative for asbestos, cobalt, cold, other organic antigens, bleomycin, amiodarone, nitrofurantoin and exposures  Socio Cultural Values: anxious to find out dx. Social values - averse to lot of meds due to fear of ADR  Cough likely due to nasal drainage, and Interstitial Lung Disease (ILD) Let us focus on ILD first  #Interstitial Lung Disease  - need to determine type and severity  -do PFT breathing test  - do walking desaturation test in office  -do ONO test on room air at home  - do High Resolution CT chest  without contrast on ILD protocol. Only  Dr Lorin Picket or Dr. Vinnie Langton to read - do blood: : ESR, ANA, DS-DNA, RF, anti-CCP, ssA, ssB, scl-70, ANCA screen, PR-3, MPO antibodies, Total CK,  Aldolase,  Hypersensitivity Pneumonitis Panel   #Followup   - return < 2 weeks after completing all of above   OV 11/18/2013  Chief Complaint  Patient presents with  . Follow-up    Pt states she has increase in cough with clear mucous and nausea when standing x 2 wks. Denies CP/tightness.     Here to discuss test results. Feesl better. No new issues. COugh is bothersome and she and husband requesting symptom relieft  Tests so far  - PFT  : stable since 2012 but walking desat test suggests progression since 2012  PFT FVC fev1 ratio BD fev1 TLC DLCO Walking desat test 185 ft x 3 laps  March 2012 xL/82% x x%  x x/62% Did not desat on RA  May 2015, post BD 2.4L/79% 2.1L/92% 87  3.8L/73% 15.8/61% Desaturated on 2nd lap to 84% on RA   ONO On 11/03/13 shows <= 89% at 75mn 8sec but </= 88% at 329m 28 sec. Will discuss with her 11/18/13 OV about o2 need at night   Autoimmune  - ANA 1:!60, P-ANCA scree- positive, P-ANCA 1:640, but MPO - negative, PR-3 negative - reset ccp, DSDAN, ssA, SSB, SCl 70, HP panel, CK, aldolase  - all negative  CT chst  11/04/13  = IMPRESSION:  1. Pulmonary parenchymal pattern of fibrosis appears minimally  progressive from 06/29/2010, favoring usual interstitial pneumonitis  (UIP).  2. Coronary artery calcification.  Electronically Signed  By: Lorin Picket M.D.  On: 11/04/2013 15:51   Review of Systems  Constitutional: Negative for fever and unexpected weight change.  HENT: Positive for congestion and postnasal drip. Negative for dental problem, ear pain, nosebleeds, rhinorrhea, sinus pressure, sneezing, sore throat and trouble swallowing.   Eyes: Negative for redness and itching.  Respiratory: Positive for cough. Negative for chest tightness, shortness of  breath and wheezing.   Cardiovascular: Negative for palpitations and leg swelling.  Gastrointestinal: Positive for nausea. Negative for vomiting.  Genitourinary: Negative for dysuria.  Musculoskeletal: Negative for joint swelling.  Skin: Negative for rash.  Neurological: Negative for headaches.  Hematological: Does not bruise/bleed easily.  Psychiatric/Behavioral: Negative for dysphoric mood. The patient is not nervous/anxious.        Objective:   Physical Exam   Filed Vitals:   11/18/13 1407  Height: _0  (1.651 m)  Weight: 186 lb 3.2 oz (84.46 kg)   Filed Vitals:   11/18/13 1407  BP: 118/62  Pulse: 95  Height: _1  (1.651 m)  Weight: 186 lb 3.2 oz (84.46 kg)  SpO2: 97%   Discussion onl visit     Assessment & Plan:  #ILD  - you have interstitial lung diease  -Severity is Mild to Moderate - Progression: possible and likely mild progression since 2012 - Causes: IPF v Autoimmune ILD v Hypersensitivity Pneumonitis. 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

## 2013-11-20 NOTE — Assessment & Plan Note (Signed)
#  ILD  - Seh has definite interstitial lung diease  -Severity is Mild to Moderate - Progression: possible and likely mild progression since 2012 based on Ct progression and worsening walking desat test though PFT is unchanged - Causes: IPF v Autoimmune ILD v Hypersensitivity Pneumonitis (HP).  Of these IPF likely. Features not fully c./w IPF are multiple antibody positivity and CT only showing POSSIBLE (not DEFINITIVE) UIP.  Age and progression fit in with IPF. However, positive antibody can be seen in IPF. If tehre is no evidence of systemic autoimmune disease then more likely this is IPF. Might need lung biopsy t settle the issue esp with hx of mold and mice exposure which can favor HP  Please   - see Dr Gavin Pound rheumatologist to see if you have autoimmune diease  - try tessalon cough perles 200mg  twice daily as needed for cough    Followup   before December 23, 2013 to re-evaluate; might need to consider lung biopsy depending on Dr Trudie Reed input   > 50% of this > 25 min visit spent in face to face counseling (15 min visit converted to 25 min)

## 2013-11-23 ENCOUNTER — Telehealth: Payer: Self-pay | Admitting: Internal Medicine

## 2013-11-23 NOTE — Telephone Encounter (Signed)
LMOM x 1 

## 2013-11-24 NOTE — Telephone Encounter (Signed)
[name] spoke with pt yesterday and advised of appt date and time.

## 2013-12-14 ENCOUNTER — Encounter: Payer: Self-pay | Admitting: Internal Medicine

## 2013-12-14 ENCOUNTER — Ambulatory Visit (INDEPENDENT_AMBULATORY_CARE_PROVIDER_SITE_OTHER): Payer: Medicare Other | Admitting: Internal Medicine

## 2013-12-14 VITALS — BP 144/80 | HR 76 | Ht 65.5 in | Wt 187.0 lb

## 2013-12-14 DIAGNOSIS — J8410 Pulmonary fibrosis, unspecified: Secondary | ICD-10-CM

## 2013-12-14 DIAGNOSIS — J849 Interstitial pulmonary disease, unspecified: Secondary | ICD-10-CM

## 2013-12-14 NOTE — Progress Notes (Signed)
Subjective:    Patient ID: [name], female    DOB: November 20, 1940, 73 y.o.   MRN: [id number]  HPI   PCP Gennette Pac, MD  HPI   73 year old female accompanied by her husband. Referred for chronic cough and evaluation for potential interstitial lung disease  Reports insidious onset of chronic cough 5 years ago. It is present on a daily constant basis. It has an up-and-down course depending on viral exacerbations. There is also generally progressive course particularly in the last several weeks to a few months. Cough is present mostly in the daytime. It is rarely present at night. Cough is mostly dry but there is an associated white mucus production. Cough is rated as severe in the last several months but otherwise it was moderate in intensity.   Sinus history: She does have chronic postnasal drainage for which he takes antihistamine as needed. She does not do a nasal steroid. She used to be followed by Dr. 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  Exposure history: [place] up until one year ago. Apparently this log  had a lot of mold. There was also mice here Otherwise history is negative for asbestos, cobalt, cold, other organic antigens, bleomycin, amiodarone, nitrofurantoin and exposures  Socio Cultural Values: anxious to find out dx. Social values - averse to lot of meds due to fear of ADR  Cough likely due to nasal drainage, and Interstitial Lung Disease (ILD) Let us focus on ILD first  #Interstitial Lung Disease  - need to determine type and severity  -do PFT breathing test  - do walking desaturation test in office  -do ONO test on room air at home  - do High Resolution CT  chest without contrast on ILD protocol. Only  Dr Lorin Picket or Dr. Vinnie Langton to read - do blood: : ESR, ANA, DS-DNA, RF, anti-CCP, ssA, ssB, scl-70, ANCA screen, PR-3, MPO antibodies, Total CK,  Aldolase,  Hypersensitivity Pneumonitis Panel   #Followup   - return < 2 weeks after completing all of above   OV 11/18/2013  Chief Complaint  Patient presents with  . Follow-up    Pt states she has increase in cough with clear mucous and nausea when standing x 2 wks. Denies CP/tightness.     Here to discuss test results. Feesl better. No new issues. COugh is bothersome and she and husband requesting symptom relieft  Tests so far  - PFT  : stable since 2012 but walking desat test suggests progression since 2012  PFT FVC fev1 ratio BD fev1 TLC DLCO Walking desat test 185 ft x 3 laps  March 2012 xL/82% x x%  x x/62% Did not desat on RA  May 2015, post BD 2.4L/79% 2.1L/92% 87  3.8L/73% 15.8/61% Desaturated on 2nd lap to 84% on RA   ONO On 11/03/13 shows <= 89% at 54mn 8sec but </= 88% at 334m 28 sec. Will discuss with her 11/18/13 OV about o2 need at night   Autoimmune  - ANA 1:!60, P-ANCA scree- positive, P-ANCA 1:640, but MPO - negative, PR-3 negative - reset ccp, DSDAN, ssA, SSB, SCl 70, HP panel, CK, aldolase  - all negative  CT  chst 11/04/13  = IMPRESSION:  1. Pulmonary parenchymal pattern of fibrosis appears minimally  progressive from 06/29/2010, favoring usual interstitial pneumonitis  (UIP).  2. Coronary artery calcification.  Electronically Signed  By: Lorin Picket M.D.  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 Follow-up    Pt states her breathing is doing well. Pt states her cough has improved, pt now has mild intermittent cough. Denies CP/tightness.    Was seen by Dr. AGavin Pound06/17/2015. She did have additional autoimmune workup including C-reactive protein, RNP antibody, anti-Jo 1, sedimentation rate and anti-Smith antibodies. All these were negative. Dr. HTrudie Reedfeels that [facility] and patient does not have connective  tissue disease related interstitial lung disease.  At this point in time patient is feeling minimally symptomatic only with very mild shortness of breath and cough. Our ddx is Possible UIP/IPF (based on CT), NSIP and a 3rd remote possibility of HP. I d/w and recommended surgical lung biopsy but she is petrified of the surgical lung biopsy to do pain that it might cause. She also says that she is a poor healer after surgery. Husband wants her to at least try  pulmonary rehabilitation first  Review of Systems  Constitutional: Negative for fever and unexpected weight change.  HENT: Negative for congestion, dental problem, ear pain, nosebleeds, postnasal drip, rhinorrhea, sinus pressure, sneezing, sore throat and trouble swallowing.   Eyes: Negative for redness and itching.  Respiratory: Positive for cough and shortness of breath. Negative for chest tightness and wheezing.   Cardiovascular: Negative for palpitations and leg swelling.  Gastrointestinal: Negative for nausea and vomiting.  Genitourinary: Negative for dysuria.  Musculoskeletal: Negative for joint swelling.  Skin: Negative for rash.  Neurological: Negative for headaches.  Hematological: Does not bruise/bleed easily.  Psychiatric/Behavioral: Negative for dysphoric mood. The patient is not  nervous/anxious.        Objective:   Physical Exam  Discussion only      Assessment & Plan:  #ILD  - you have interstitial lung diease  -Severity is Mild to Moderate - Progression so far: possible and likely mild progression since 2012 - Causes: IPF v NSIP v Hypersensitivity Pneumonitis. Of these I think IPF most likely but course has been slow for it so other diseases possible - REfer pulmonary rehab for ILD  - REcommend surgical lung biopsy but support your decision to think it over  Please   Followup   2 months

## 2013-12-14 NOTE — Patient Instructions (Addendum)
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 Of these I think IPF most likely but course has been slow for it so other diseases possible - REfer pulmonary rehab for ILD  - REcommend surgical lung biopsy but support your decision to think it over  Please   Followup   2 months

## 2013-12-18 ENCOUNTER — Telehealth (HOSPITAL_COMMUNITY): Payer: Self-pay

## 2013-12-18 NOTE — Telephone Encounter (Signed)
I have called and left a message with Kahliya to inquire about participation in Pulmonary Rehab. Will send letter in mail and follow up.

## 2013-12-20 NOTE — Assessment & Plan Note (Signed)
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 Of these I think IPF most likely but course has been slow for it so other diseases possible   PLAN - REfer pulmonary rehab for ILD  - given varying possible specific diseases with very different prognosis, and Rx regimen for each (IPF, NSIP, HP) I rEcommend surgical lung biopsy but  Is petrified and I supported her  decision to think it over but warned them delays of a year is not acceptable and we need to decide in a few months max  Please   Followup   2 months  > 50% of this > 25 min visit spent in face to face counseling

## 2013-12-29 ENCOUNTER — Telehealth (HOSPITAL_COMMUNITY): Payer: Self-pay

## 2013-12-29 NOTE — Telephone Encounter (Signed)
Called patient regarding entrance to Pulmonary Rehab.  Patient states that they are interested in attending the program.  Li is going to verify insurance coverage and follow up.

## 2013-12-31 ENCOUNTER — Telehealth (HOSPITAL_COMMUNITY): Payer: Self-pay

## 2013-12-31 NOTE — Telephone Encounter (Signed)
Patient states that she can not afford the large copay for Pulmonary Rehab.  Patient is interested in Pulmonary Maintenance but states that the schedule is an issue.  Patient is going to talk over with husband and follow up.

## 2014-01-05 ENCOUNTER — Other Ambulatory Visit: Payer: Self-pay | Admitting: Internal Medicine

## 2014-01-13 ENCOUNTER — Telehealth: Payer: Self-pay | Admitting: Internal Medicine

## 2014-01-13 DIAGNOSIS — J8410 Pulmonary fibrosis, unspecified: Secondary | ICD-10-CM

## 2014-01-13 MED ORDER — CLORAZEPATE DIPOTASSIUM 3.75 MG PO TABS: ORAL_TABLET | ORAL | Status: DC

## 2014-01-13 NOTE — Telephone Encounter (Signed)
Rx sent 

## 2014-01-22 ENCOUNTER — Encounter: Payer: Self-pay | Admitting: Internal Medicine

## 2014-02-06 ENCOUNTER — Other Ambulatory Visit: Payer: Self-pay | Admitting: Internal Medicine

## 2014-02-11 ENCOUNTER — Encounter: Payer: Self-pay | Admitting: Internal Medicine

## 2014-02-11 ENCOUNTER — Ambulatory Visit (INDEPENDENT_AMBULATORY_CARE_PROVIDER_SITE_OTHER): Payer: Medicare Other | Admitting: Internal Medicine

## 2014-02-11 VITALS — BP 128/66 | HR 84 | Ht 65.0 in | Wt 189.0 lb

## 2014-02-11 DIAGNOSIS — R05 Cough: Secondary | ICD-10-CM | POA: Insufficient documentation

## 2014-02-11 DIAGNOSIS — J8410 Pulmonary fibrosis, unspecified: Secondary | ICD-10-CM

## 2014-02-11 DIAGNOSIS — R059 Cough, unspecified: Secondary | ICD-10-CM

## 2014-02-11 DIAGNOSIS — R053 Chronic cough: Secondary | ICD-10-CM | POA: Insufficient documentation

## 2014-02-11 DIAGNOSIS — J849 Interstitial pulmonary disease, unspecified: Secondary | ICD-10-CM

## 2014-02-11 NOTE — Patient Instructions (Addendum)
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 Of these I think IPF most likely but course has been slow for it so other diseases possible - Too bad rehab expensive: start home aerobic exercise using pulse oximeter - ensure o2 stays > 88% -- REcommend surgical lung biopsy but support your decision to refuse - Do HRCT chest and full PFT in 4 months   #Cough  - this is possibly due to ILD disease but sinus drainage, dusty enviroment and possible acid reflux contributing - ensure stay away from dusty environment  - restart flonase and saline wash for your sinus  - start OTC pepcid 20mg  at night for possible acid reflux (stay away from PPI Class due to IBS)  #Followup - Do HRCT chest and full PFT in 4 months - return to see me in 4 months

## 2014-02-11 NOTE — Assessment & Plan Note (Signed)
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 Of these I think IPF most likely but course has been slow for it so other diseases possible - Too bad rehab expensive: start home aerobic exercise using pulse oximeter - ensure o2 stays > 88% -- REcommend surgical lung biopsy but support your decision to refuse - Do HRCT chest and full PFT in 4 months - if more evidence of IPF will discuss therapies (most likely she will refuse due to gI issues)   > 50% of this > 25 min visit spent in face to face counseling (15 min visit converted to 25 min)

## 2014-02-11 NOTE — Progress Notes (Signed)
Subjective:    Patient ID: [name], female    DOB: [birth year], 73 y.o.   MRN: [id number]  HPI    73 year old female accompanied by her husband. Referred for chronic cough and evaluation for potential interstitial lung disease  Reports insidious onset of chronic cough 5 years ago. It is present on a daily constant basis. It has an up-and-down course depending on viral exacerbations. There is also generally progressive course particularly in the last several weeks to a few months. Cough is present mostly in the daytime. It is rarely present at night. Cough is mostly dry but there is an associated white mucus production. Cough is rated as severe in the last several months but otherwise it was moderate in intensity.   Sinus history: She does have chronic postnasal drainage for which he takes antihistamine as needed. She does not do a nasal steroid. She used to be followed by Dr. 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  Exposure history: She lived [place] log cabin for [place] years up until one year ago. Apparently this log  had a lot of mold. There was also mice here Otherwise history is negative for asbestos, cobalt, cold, other organic antigens, bleomycin, amiodarone, nitrofurantoin and exposures  Socio Cultural Values: anxious to find out dx. Social values - averse to lot of meds due to fear of ADR  Cough likely due to nasal drainage, and Interstitial Lung Disease (ILD) Let us focus on ILD first  #Interstitial Lung Disease  - need to determine type and severity  -do PFT breathing test  - do walking desaturation test in office  -do ONO test on room air at home  - do High Resolution CT chest without contrast on ILD protocol.  Only  Dr Lorin Picket or Dr. Vinnie Langton to read - do blood: : ESR, ANA, DS-DNA, RF, anti-CCP, ssA, ssB, scl-70, ANCA screen, PR-3, MPO antibodies, Total CK,  Aldolase,  Hypersensitivity Pneumonitis Panel   #Followup   - return < 2 weeks after completing all of above   OV 11/18/2013  Chief Complaint  Patient presents with  . Follow-up    Pt states she has increase in cough with clear mucous and nausea when standing x 2 wks. Denies CP/tightness.     Here to discuss test results. Feesl better. No new issues. COugh is bothersome and she and husband requesting symptom relieft  Tests so far  - PFT  : stable since 2012 but walking desat test suggests progression since 2012  PFT FVC fev1 ratio BD fev1 TLC DLCO Walking desat test 185 ft x 3 laps  March 2012 xL/82% x x%  x x/62% Did not desat on RA  May 2015, post BD 2.4L/79% 2.1L/92% 87  3.8L/73% 15.8/61% Desaturated on 2nd lap to 84% on RA   ONO On 11/03/13 shows <= 89% at 55mn 8sec but </= 88% at 375m 28 sec. Will discuss with her 11/18/13 OV about o2 need at night   Autoimmune  - ANA 1:!60, P-ANCA scree- positive, P-ANCA 1:640, but MPO - negative, PR-3 negative - reset ccp, DSDAN, ssA, SSB, SCl 70, HP panel, CK, aldolase  - all negative  CT chst 11/04/13  = IMPRESSION:  1.  Pulmonary parenchymal pattern of fibrosis appears minimally  progressive from 06/29/2010, favoring usual interstitial pneumonitis  (UIP).  2. Coronary artery calcification.  Electronically Signed  By: Lorin Picket M.D.  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 Of these I think IPF most likely   Please   - see Dr Gavin Pound rheumatologist to see if you have autoimmune diease  - try tessalon cough perles 253m twice daily as needed for cough    Followup   before  December 23, 2013 to re-evaluate; might need to consider lung biopsy depending on Dr HTrudie Reedinput   OSherman7/11/2013  Chief Complaint  Patient presents with  . Follow-up    Pt states her breathing is doing well. Pt states her cough has improved, pt now has mild intermittent cough. Denies CP/tightness.    Was seen by Dr. AGavin Pound06/17/2015. She did have additional autoimmune workup including C-reactive protein, RNP antibody, anti-Jo 1, sedimentation rate and anti-Smith antibodies. All these were negative. Dr. HTrudie Reedfe[facility] and patient does not have connective  tissue disease related interstitial lung disease.  At this point in time patient is feeling minimally symptomatic only with very mild shortness of breath and cough. Our ddx is Possible UIP/IPF (based on CT), NSIP and a 3rd remote possibility of HP. I d/w and recommended surgical lung biopsy but she is petrified of the surgical lung biopsy to do pain that it might cause. She also says that she is a poor healer after surgery. 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 Of these I think IPF most likely but course has been slow for it so other diseases possible - REfer pulmonary rehab for ILD  - REcommend surgical lung biopsy but support your decision to think it over  Please   Followup   2 months   OV 02/11/2014   Chief Complaint  Patient presents with  . Follow-up    Pt c/o increase in cough with clear mucous. Pt denies SOB and CP/tightness.    Followup interstitial lung disease not otherwise specified but possible UIP/IPF Followup multifactorial chronic cough   -Interstitial lung disease: This visit was specifically to discuss the possibility of having surgical lung biopsy  for definitive diagnosis of interstitial lung disease. However, she flatly refused to have surgical lung biopsy because of fear of complications. Walking desaturation test today 185 feet x3 laps and rheumatic: She did not desaturate. In terms of dyspnea she says this is stable. She did not attend pulmonary rehabilitation because of cost issues and states she will exercise by herself at home and is asking for guidelines  -Multi-Factorial chronic cough: She and her husband state that over the last month or so chronic cough is significantly worse associated with some sinus drainage for which she does not take her treatment regularly. Cough is worse particularly after eating which is not on acid reflux therapy. She is absolutely petrified of taking proton pump inhibitor do to presence of chronic diarrhea secondary to irritable bowel syndrome. She is only reluctantly agreed to taking H2 blockade. Cough is of moderate  severity but does have a waxing and waning quality and a few days ago this was severe. She is moved home protimes and is being exposed to significant dust along with a cat  Review of Systems  Constitutional: Negative for fever and unexpected weight change.  HENT: Negative for congestion, dental problem, ear pain, nosebleeds, postnasal drip, rhinorrhea, sinus pressure, sneezing, sore throat and trouble swallowing.   Eyes: Negative for redness and itching.  Respiratory: Positive for cough. Negative for chest tightness, shortness of breath and wheezing.   Cardiovascular: Negative for palpitations and leg swelling.  Gastrointestinal: Positive for nausea. Negative for vomiting.  Genitourinary: Negative for dysuria.  Musculoskeletal: Negative for joint swelling.  Skin: Negative for rash.  Neurological: Negative for headaches.  Hematological: Does not bruise/bleed easily.  Psychiatric/Behavioral: Negative for dysphoric mood. The patient is not nervous/anxious.        Objective:   Physical  Exam   Filed Vitals:   02/11/14 1643  BP: 128/66  Pulse: 84  Height: 5' 5"  (1.651 m)  Weight: 189 lb (85.73 kg)  SpO2: 97%  Body mass index is 31.45 kg/(m^2).  itals reviewed. Constitutional: She is oriented to person, place, and time. She appears well-developed and well-nourished. No distress.  HENT:  Head: Normocephalic and atraumatic.  Right Ear: External ear normal.  Left Ear: External ear normal.  Mouth/Throat: Oropharynx is clear and moist. No oropharyngeal exudate.  Eyes: Conjunctivae and EOM are normal. Pupils are equal, round, and reactive to light. Right eye exhibits no discharge. Left eye exhibits no discharge. No scleral icterus.  Neck: Normal range of motion. Neck supple. No JVD present. No tracheal deviation present. No thyromegaly present.  Cardiovascular: Normal rate, regular rhythm, normal heart sounds and intact distal pulses.  Exam reveals no gallop and no friction rub.   No murmur heard. Pulmonary/Chest: Effort normal. No respiratory distress. She has no wheezes. She has rales. She exhibits no tenderness.  Basal crackles+   Abdominal: Soft. Bowel sounds are normal. She exhibits no distension and no mass. There is no tenderness. There is no rebound and no guarding.  Musculoskeletal: Normal range of motion. She exhibits no edema and no tenderness.  Lymphadenopathy:    She has no cervical adenopathy.  Neurological: She is alert and oriented to person, place, and time. She has normal reflexes. No cranial nerve deficit. She exhibits normal muscle tone. Coordination normal.  Skin: Skin is warm and dry. No rash noted. She is not diaphoretic. No erythema. No pallor.  ? Spider nevii on chest Psychiatric: She has a normal mood and affect. Her behavior is normal. Judgment and thought content normal.       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 Of these I think IPF most likely but course has been slow for it so other diseases possible - Too bad rehab expensive: start home aerobic exercise using pulse oximeter - ensure o2 stays > 88% -- REcommend surgical lung biopsy but support your decision to refuse - Do HRCT chest and full PFT in 4 months - if more evidence of IPF will discuss therapies (most likely she will refuse due to gI issues)   #Cough  - this is possibly due to ILD disease but sinus drainage, dusty enviroment and possible acid reflux contributing - ensure stay away from dusty environment  -  restart flonase and saline wash for your sinus  - start OTC pepcid 60m at night for possible acid reflux (stay away from PPI Class due to IBS)  #Followup - Do HRCT chest and full PFT in 4 months - return to see me in 4 months  > 50% of this > 25 min visit spent in face to face counseling (15 min visit converted to 25 min)

## 2014-02-11 NOTE — Assessment & Plan Note (Signed)
 #  Cough  - this is possibly due to ILD disease but sinus drainage, dusty enviroment and possible acid reflux contributing - ensure stay away from dusty environment  - restart flonase and saline wash for your sinus  - start OTC pepcid 20mg  at night for possible acid reflux (stay away from PPI Class due to IBS)  #Followup - Do HRCT chest and full PFT in 4 months - return to see me in 4 months

## 2014-02-16 ENCOUNTER — Encounter: Payer: Self-pay | Admitting: Internal Medicine

## 2014-02-16 ENCOUNTER — Ambulatory Visit (INDEPENDENT_AMBULATORY_CARE_PROVIDER_SITE_OTHER): Payer: Medicare Other | Admitting: Internal Medicine

## 2014-02-16 VITALS — BP 126/72 | HR 78 | Ht 65.5 in | Wt 187.0 lb

## 2014-02-16 DIAGNOSIS — K589 Irritable bowel syndrome without diarrhea: Secondary | ICD-10-CM

## 2014-02-16 MED ORDER — CLORAZEPATE DIPOTASSIUM 3.75 MG PO TABS: ORAL_TABLET | ORAL | Status: DC

## 2014-02-16 NOTE — Progress Notes (Signed)
Tyshay Adee Esquivel 08/29/1940 [id number]  Note: This dictation was prepared with Dragon digital system. Any transcriptional errors that result from this procedure are unintentional.   History of Present Illness:  This is a 73 year old white female with a history of diarrhea predominant irritable bowel syndrome. Her symptoms have been controlled for many years with tranxene 3.75 mg up to 3 times a day. She has recently been well, having regular bowel habits. She used to take Lomotil but no longer has to take it. Her last colonoscopy was in August 2011. She had a polyp. A recall colonoscopy would be due in 2021. She had a prior cholecystectomy in November 2002. She has been recently evaluated for autoimmune disease by Dr Elizebeth Koller and had a positive ANA titer 1-160.    Past Medical History  Diagnosis Date  . Stenosis of rectum and anus   . IBS (irritable bowel syndrome)   . Chronic diarrhea   . ILD (interstitial lung disease)   . Bronchiectasis     PFTs August 23, 2010 VC 82%   dlco 62 > 129 CORRECTED  . Chronic rhinitis     sinus CT ordered August 23, 2010  . Hyperplastic colon polyp   . Diabetes mellitus     Past Surgical History  Procedure Laterality Date  . Hand surgery  1991    left hand; Baker's Cyst removed  . Dilation and curettage of uterus    . Cholecystectomy    . Tear duct surgery      Allergies  Allergen Reactions  . Clarithromycin   . Cortisone   . Penicillins   . Povidone-Iodine     Family history and social history have been reviewed.  Review of Systems: Denies heartburn abdominal pain diarrhea  The remainder of the 10 point ROS is negative except as outlined in the H&P  Physical Exam: General Appearance Well developed, in no distress Eyes  Non icteric  HEENT  Non traumatic, normocephalic  Mouth No lesion, tongue papillated, no cheilosis Neck Supple without adenopathy, thyroid not enlarged, no carotid bruits, no JVD Lungs Clear to auscultation  bilaterally COR Normal S1, normal S2, regular rhythm, no murmur, quiet precordium Abdomen protuberant but soft. Minimal epigastric discomfort. Normoactive bowel sounds. Liver edge at costal margin. Lower abdomen unremarkable Rectal soft Hemoccult negative stool Extremities  No pedal edema Skin No lesions Neurological Alert and oriented x 3 Psychological Normal mood and affect  Assessment and Plan:   Problem #43 73 year old white female with irritable bowel syndrome controlled on Tranxene 3.75 mg, 1-3 times a day. Her symptoms are responsive to minor tranquilizers. She is Hemoccult-negative. She will be due for a recall colonoscopy in August 2021. I will see her on an as necessary basis.    Delfin Edis 02/16/2014

## 2014-02-16 NOTE — Patient Instructions (Addendum)
We have sent the following medications to your pharmacy for you to pick up at your convenience: Tranxene  Dr [name]

## 2014-06-14 ENCOUNTER — Ambulatory Visit (INDEPENDENT_AMBULATORY_CARE_PROVIDER_SITE_OTHER)
Admission: RE | Admit: 2014-06-14 | Discharge: 2014-06-14 | Disposition: A | Discharge status: Home / Self Care | Payer: Medicare Other | Source: Ambulatory Visit | Attending: Internal Medicine | Admitting: Internal Medicine

## 2014-06-14 DIAGNOSIS — J849 Interstitial pulmonary disease, unspecified: Secondary | ICD-10-CM

## 2014-06-17 ENCOUNTER — Telehealth: Payer: Self-pay | Admitting: Internal Medicine

## 2014-06-17 NOTE — Telephone Encounter (Signed)
CT shows continued pulmonary fibrosis. Likely IPF. Let her do full PFT and see me first available for followup.  Let her know  Thanks  Dr. Brand Males, M.D., [facility].C.P Pulmonary and Critical Care Medicine Staff Physician [place] [facility] Pager: [phone], If no answer or between  15:00h - 7:00h: call [phone]  06/17/2014 8:05 AM

## 2014-06-23 NOTE — Telephone Encounter (Signed)
Called and spoke to pt. Informed pt of the results and recs per MR. PFT and appt made for 07/15/2014. Pt verbalized understanding and denied any further questions or concerns at this time.

## 2014-07-15 ENCOUNTER — Ambulatory Visit (INDEPENDENT_AMBULATORY_CARE_PROVIDER_SITE_OTHER): Payer: Medicare Other | Admitting: Internal Medicine

## 2014-07-15 ENCOUNTER — Encounter: Payer: Self-pay | Admitting: Internal Medicine

## 2014-07-15 VITALS — BP 122/70 | HR 91 | Ht 65.0 in | Wt 190.0 lb

## 2014-07-15 DIAGNOSIS — J849 Interstitial pulmonary disease, unspecified: Secondary | ICD-10-CM

## 2014-07-15 DIAGNOSIS — J84112 Idiopathic pulmonary fibrosis: Secondary | ICD-10-CM | POA: Diagnosis not present

## 2014-07-15 LAB — PULMONARY FUNCTION TEST
DL/VA % PRED: 90 %
DL/VA: 4.45 ml/min/mmHg/L
DLCO unc % pred: 60 %
DLCO unc: 15.61 ml/min/mmHg
FEF 25-75 POST: 3.49 L/s
FEF 25-75 Pre: 3.07 L/sec
FEF2575-%Change-Post: 13 %
FEF2575-%PRED-PRE: 171 %
FEF2575-%Pred-Post: 194 %
FEV1-%Change-Post: 2 %
FEV1-%PRED-POST: 96 %
FEV1-%PRED-PRE: 94 %
FEV1-Post: 2.17 L
FEV1-Pre: 2.13 L
FEV1FVC-%Change-Post: 1 %
FEV1FVC-%PRED-PRE: 116 %
FEV6-%Change-Post: 0 %
FEV6-%Pred-Post: 85 %
FEV6-%Pred-Pre: 85 %
FEV6-POST: 2.46 L
FEV6-Pre: 2.44 L
FEV6FVC-%Change-Post: 0 %
FEV6FVC-%Pred-Post: 105 %
FEV6FVC-%Pred-Pre: 105 %
FVC-%Change-Post: 0 %
FVC-%Pred-Post: 81 %
FVC-%Pred-Pre: 81 %
FVC-POST: 2.46 L
FVC-PRE: 2.44 L
PRE FEV6/FVC RATIO: 100 %
Post FEV1/FVC ratio: 88 %
Post FEV6/FVC ratio: 100 %
Pre FEV1/FVC ratio: 87 %
RV % PRED: 52 %
RV: 1.22 L
TLC % pred: 70 %
TLC: 3.68 L

## 2014-07-15 NOTE — Patient Instructions (Addendum)
ICD-9-CM ICD-10-CM   1. ILD (interstitial lung disease) 515 J84.9   2. 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 Of these I think IPF most likely  - WE agreed after long discussion of various options that we will just monitor  #Cough  - this is possibly due to ILD disease but sinus drainage, dusty enviroment and possible acid reflux contributing - ensure stay away from dusty environment  -continue  flonase and saline wash for your sinus  - start OTC pepcid 20mg  at night for possible acid reflux (stay away from PPI Class due to IBS) - at any time you want to see allergist let us know  #Followup -  return to see me in 4 months  - office spiro (not Tammy Wilson) and walk test in office (not 6 min walk) at followup)

## 2014-07-15 NOTE — Progress Notes (Signed)
Subjective:    Patient ID: [name], female    DOB: 24-Apr-1941, 74 y.o.   MRN: [id number]  HPI  #IPF diagnosed baseed on    Sinus history: She does have chronic postnasal drainage for which he takes antihistamine as needed. She does not do a nasal steroid. She used to be followed by Dr. 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  Exposure history: [place] lived [place] years up until one year ago. Apparently this log  had a lot of mold. There was also mice here Otherwise history is negative for asbestos, cobalt, cold, other organic antigens, bleomycin, amiodarone, nitrofurantoin and exposures  Socio Cultural Values: anxious to find out dx. Social values - averse to lot of meds due to fear of ADR. Considently declined surgical lung bx 2015    #PFT PERFORMANCE  PFT FVC fev1 ratio BD fev1 TLC DLCO Walking desat test 185 ft x 3 laps CT cjhest  March 2012 xL/82% x x%  x x/62% Did not desat on RA   May 2015, post BD 2.4L/79% 2.1L/92% 87  3.8L/73% 15.8/61% Desaturated on 2nd lap to 84% on RA   07/15/2014  2.46L/.81% 2.17/96% 117  3.68/70% 15.6/60% Did not desat. ighers hr 109 Likely definitive  UIP with honeycombing- no change dfor mary 2015    OV 07/15/2014  Chief Complaint  Patient presents with  . Follow-up    Pt stated her breathing feels improved since last OV. Pt c/o mild DOE, prod cough with cloudy colored mucus, PND. Pt denies CP/tightness.      FU ILD - high pre=test prob IPF  Overall stble. Still with dyspnea and cough. Still reluctant for bx, taking meds. Lung function is staable. Same questions abut disease dx, prognosis, Rx options    Review of Systems  Constitutional: Negative for fever and  unexpected weight change.  HENT: Negative for congestion, dental problem, ear pain, nosebleeds, postnasal drip, rhinorrhea, sinus pressure, sneezing, sore throat and trouble swallowing.   Eyes: Negative for redness and itching.  Respiratory: Positive for cough and shortness of breath. Negative for chest tightness and wheezing.   Cardiovascular: Negative for palpitations and leg swelling.  Gastrointestinal: Negative for nausea and vomiting.  Genitourinary: Negative for dysuria.  Musculoskeletal: Negative for joint swelling.  Skin: Negative for rash.  Neurological: Negative for headaches.  Hematological: Does not bruise/bleed easily.  Psychiatric/Behavioral: Negative for dysphoric mood. The patient is not nervous/anxious.        Current outpatient prescriptions:  .  Ascorbic Acid (VITAMIN C) 500 MG tablet, Take 500 mg by mouth daily.  , Disp: , Rfl:  .  Calcium Carbonate-Vitamin D (CALCIUM 600-D) 600-400 MG-UNIT per tablet, Take 1 tablet by mouth 2 (two) times daily.  , Disp: , Rfl:  .  clorazepate (TRANXENE) 3.75 MG tablet, TAKE 1 TABLET BY MOUTH DAILY. May take up to 2 tablets daily if needed, Disp: 100 tablet, Rfl: 1 .  cyclobenzaprine (FLEXERIL) 10 MG tablet, 1/2 - 1 tablet by mouth at bedtime  , Disp: , Rfl:  .  glucosamine-chondroitin 500-400 MG tablet, Take 1 tablet by mouth 2 (two) times daily.  , Disp: , Rfl:  .  HYDROcodone-acetaminophen (VICODIN) 5-500 MG  per tablet, Take 1 tablet by mouth every 6 (six) hours as needed., Disp: 30 tablet, Rfl: 0 .  levothyroxine (SYNTHROID, LEVOTHROID) 88 MCG tablet, Take 88 mcg by mouth daily.  , Disp: , Rfl:  .  Multiple Vitamin (MULTIVITAMIN) tablet, Take 1 tablet by mouth daily.  , Disp: , Rfl:  .  pioglitazone (ACTOS) 15 MG tablet, Take 15 mg by mouth daily., Disp: , Rfl:  .  pravastatin (PRAVACHOL) 10 MG tablet, Take 10 mg by mouth daily., Disp: , Rfl:  .  verapamil (CALAN) 120 MG tablet, 1/2 tab by mouth twice daily  , Disp: , Rfl:  .   vitamin B-12 (CYANOCOBALAMIN) 100 MCG tablet, Take 100 mcg by mouth daily., Disp: , Rfl:   Objective:   Physical Exam  Filed Vitals:   07/15/14 1613  BP: 122/70  Pulse: 91  Height: 5\' 5"  (1.651 m)  Weight: 190 lb (86.183 kg)  SpO2: 97%         Assessment & Plan:     ICD-9-CM ICD-10-CM   1. ILD (interstitial lung disease) 515 J84.9   2. 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 Of these I think IPF most likely  - WE agreed after long discussion of various options that we will just monitor  #Cough  - this is possibly due to ILD disease but sinus drainage, dusty enviroment and possible acid reflux contributing - ensure stay away from dusty environment  -continue  flonase and saline wash for your sinus  - start OTC pepcid 20mg  at night for possible acid reflux (stay away from PPI Class due to IBS) - at any time you want to see allergist let us know  #Followup -  return to see me in 4 months  - office spiro (not Tammy Wilson) and walk test in office (not 6 min walk) at followup)     > 50% of this > 25 min visit spent in face to face counseling or coordination of care (15 min visit converted to 25 min)   Dr. Brand Males, M.D., [facility].C.P Pulmonary and Critical Care Medicine Staff Physician [place] [facility] Pager: [phone], If no answer or between  15:00h - 7:00h: call [phone]  08/08/2014 7:29 PM

## 2014-07-15 NOTE — Progress Notes (Signed)
PFT done today. 

## 2014-09-28 ENCOUNTER — Other Ambulatory Visit: Payer: Self-pay | Admitting: Internal Medicine

## 2014-12-08 ENCOUNTER — Telehealth: Payer: Self-pay | Admitting: Internal Medicine

## 2014-12-08 NOTE — Telephone Encounter (Signed)
Called pt. She scheduled appt to see MR in august. Nothing further needed

## 2015-02-07 ENCOUNTER — Ambulatory Visit (INDEPENDENT_AMBULATORY_CARE_PROVIDER_SITE_OTHER): Payer: Medicare Other | Admitting: Internal Medicine

## 2015-02-07 ENCOUNTER — Encounter: Payer: Self-pay | Admitting: Internal Medicine

## 2015-02-07 VITALS — BP 144/84 | HR 81 | Ht 65.0 in | Wt 190.0 lb

## 2015-02-07 DIAGNOSIS — Z9109 Other allergy status, other than to drugs and biological substances: Secondary | ICD-10-CM | POA: Insufficient documentation

## 2015-02-07 DIAGNOSIS — J84112 Idiopathic pulmonary fibrosis: Secondary | ICD-10-CM

## 2015-02-07 DIAGNOSIS — Z9189 Other specified personal risk factors, not elsewhere classified: Secondary | ICD-10-CM | POA: Diagnosis not present

## 2015-02-07 DIAGNOSIS — Z889 Allergy status to unspecified drugs, medicaments and biological substances status: Secondary | ICD-10-CM

## 2015-02-07 NOTE — Patient Instructions (Addendum)
ICD-9-CM ICD-10-CM   1. IPF (idiopathic pulmonary fibrosis) 516.31 J84.112   2. 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 Of these I think IPF most likely  - WE agreed after long discussion of various options that we will just monitor  #Cough and hx of allergies  - this is possibly due to ILD disease but sinus drainage, dusty enviroment and possible acid reflux contributing - ensure stay away from dusty environment  -continue  flonase and saline wash for your sinus  - start OTC pepcid '20mg'$  at night for possible acid reflux (stay away from PPI Class due to IBS) - drink water - try to break urge to cough  - refer Dr Annamaria Boots allergist  #Followup -  return to see me in 6 months  - full PFT  and walk test in office (not 6 min walk) at followup

## 2015-02-07 NOTE — Progress Notes (Signed)
Subjective:    Patient ID: [name], female    DOB: 11-May-1941, 74 y.o.   MRN: [id number]  HPI   #IPF diagnosed baseed on  Sinus history: She does have chronic postnasal drainage for which he takes antihistamine as needed. She does not do a nasal steroid. She used to be followed by Dr. 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  Exposure history: She lived in a log cabin for 7 years up until one year ago. Apparently this log  had a lot of mold. There was also mice here Otherwise history is negative for asbestos, cobalt, cold, other organic antigens, bleomycin, amiodarone, nitrofurantoin and exposures  Socio Cultural Values: anxious to find out dx. Social values - averse to lot of meds due to fear of ADR. Considently declined surgical lung bx 2015   Autoimmune panel - May 2015: Normal hypersensitivity pneumonitis panel. Extensive autoimmune panel is negative except for ANA titer 1:160 homogenous pattern and p-ANCA screen positive at 1:640 but negative myeloperoxidase and PR 3 antibodies. Other antibodies are negative include CCP, double-stranded DNA, myeloperoxidase, rheumatoid factor, c-ANCA screen, SSA, SSB, scleroderma 70   #PFT PERFORMANCE  PFT FVC fev1 ratio BD fev1 TLC DLCO Walking desat test 185 ft x 3 laps CT cjhest  March 2012 xL/82% x x%  x x/62% Did not desat on RA   May 2015, post BD 2.4L/79% 2.1L/92% 87  3.8L/73% 15.8/61% Desaturated on 2nd lap to 84% on RA   07/15/2014  2.46L/.81% 2.17/96% 117  3.68/70% 15.6/60% Did not desat. 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 Pk HR  103/min       FU ILD - high pre=test prob  IPF  Overall stble. Still with dyspnea and cough. Still reluctant for bx, taking meds. Lung function is staable. Same questions abut disease dx, prognosis, Rx options   OV 02/07/2015  Chief Complaint  Patient presents with  . Follow-up    Pt states her breathing is unchanged. Pt states the heat and humidity cause her breahting to become more labored. Pt c/o slight increase in cough with clear mucus. Pt denies CP/tightness.     Follow-up interstitial lung disease - differential diagnosis of hypersensitivity pneumonitis versus IPF versus NSIP  - Presents as always with her husband. She still has mild exertional dyspnea. Her cough in the last few to several weeks is worse. She feels the cough is coming from her throat. It is dry. She is more fatigued than usual. Her house does not have any air conditioning so she feels this is due to the heat. She does have the clear her throat periodically. She again is confused about her interstitial lung disease and the different differential diagnosis. Dyspnea however is stable. Walking desaturation test today 185 feet 3 laps no desaturation . She is also endorsing significant environmental allergies. She wants to see allergist Dr. Annamaria Boots but is somewhat worried that he might not see her because originally in 2012 when she requested to see him in the office gave her an appointment to see me although she says she is quite happy following with me  Review of Systems  Constitutional: Negative for fever and unexpected weight change.  HENT: Negative for congestion, dental problem, ear pain, nosebleeds, postnasal drip, rhinorrhea, sinus pressure, sneezing, sore throat and trouble swallowing.   Eyes: Negative for redness and itching.  Respiratory: Positive for cough and shortness of breath. Negative for chest tightness and wheezing.   Cardiovascular: Negative for palpitations and leg swelling.  Gastrointestinal: Negative for nausea and vomiting.  Genitourinary:  Negative for dysuria.  Musculoskeletal: Negative for joint swelling.  Skin: Negative for rash.  Neurological: Negative for headaches.  Hematological: Does not bruise/bleed easily.  Psychiatric/Behavioral: Negative for dysphoric mood. The patient is not nervous/anxious.        Objective:   Physical Exam  Constitutional: She is oriented to person, place, and time. She appears well-developed and well-nourished. No distress.  HENT:  Head: Normocephalic and atraumatic.  Right Ear: External ear normal.  Left Ear: External ear normal.  Mouth/Throat: Oropharynx is clear and moist. No oropharyngeal exudate.  Eyes: Conjunctivae and EOM are normal. Pupils are equal, round, and reactive to light. Right eye exhibits no discharge. Left eye exhibits no discharge. No scleral icterus.  Neck: Normal range of motion. Neck supple. No JVD present. No tracheal deviation present. No thyromegaly present.  Cardiovascular: Normal rate, regular rhythm, normal heart sounds and intact distal pulses.  Exam reveals no gallop and no friction rub.   No murmur heard. Pulmonary/Chest: Effort normal. No respiratory distress. She has no wheezes. She has rales. She exhibits no tenderness.  Bilateral bibasal crackles  Abdominal: Soft. Bowel sounds are normal. She exhibits no distension and no mass. There is no tenderness. There is no rebound and no guarding.  Musculoskeletal: Normal range of motion. She exhibits no edema or tenderness.  Lymphadenopathy:    She has no cervical adenopathy.  Neurological: She is alert and oriented to person, place, and time. She has normal reflexes. No cranial nerve deficit. She exhibits normal muscle tone. Coordination normal.  Skin: Skin is warm and dry. No rash noted. She is not diaphoretic. No erythema. No pallor.  Psychiatric: She has a normal mood and affect. Her behavior is normal. Judgment and thought content normal.  Vitals reviewed.   Filed Vitals:   02/07/15 1641  BP: 144/84   Pulse: 81  Height: '5\' 5"'$  (1.651 m)  Weight: 190 lb (86.183 kg)  SpO2: 98%  ;s      Assessment & Plan:     ICD-9-CM ICD-10-CM   1. IPF (idiopathic pulmonary fibrosis) 516.31 J84.112 Pulmonary Function Test  2. 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 Of these I think IPF most likely althought could be NSIP - WE agreed after long discussion of various options that we will just monitor - educated for the Nth time what all these ILD entities are - again confused  #Cough and hx of allergies  - this is possibly due to ILD disease but sinus drainage, dusty enviroment and possible acid reflux contributing - ensure stay away from dusty environment  -continue  flonase and saline wash for your sinus  - start OTC pepcid '20mg'$  at night for possible acid reflux (stay away from PPI Class due to IBS) - drink water - try to break urge to cough  -  refer Dr Annamaria Boots allergist  #Followup -  return to see me in 6 months  - full PFT  and walk test in office (not 6 min walk) at followup  > 50% of this > 25 min visit spent in face to face counseling or coordination of care     Dr. Brand Males, M.D., [facility].C.P Pulmonary and Critical Care Medicine Staff Physician [place] [facility] Pager: [phone], If no answer or between  15:00h - 7:00h: call [phone]  02/07/2015 5:31 PM

## 2015-05-10 ENCOUNTER — Telehealth: Payer: Self-pay | Admitting: Internal Medicine

## 2015-05-10 MED ORDER — CLORAZEPATE DIPOTASSIUM 3.75 MG PO TABS: ORAL_TABLET | ORAL | Status: DC

## 2015-05-10 NOTE — Telephone Encounter (Signed)
Medication refilled until appt 07/13/2015.

## 2015-05-10 NOTE — Telephone Encounter (Signed)
Please advise 

## 2015-05-10 NOTE — Telephone Encounter (Signed)
Ok to refill until appt 

## 2015-05-11 ENCOUNTER — Telehealth: Payer: Self-pay | Admitting: Gastroenterology

## 2015-05-11 NOTE — Telephone Encounter (Signed)
Patient called stating the medication wasn't called in. Med was sent in electronically yesterday per Epic. Called the pharmacy and talked to Gwyndolyn Saxon at [facility] stated that the had not received the medication so medication was given verbally over the phone Tranxene 3.75 mg # 60 with 1 refill.

## 2015-07-13 ENCOUNTER — Ambulatory Visit (INDEPENDENT_AMBULATORY_CARE_PROVIDER_SITE_OTHER): Payer: Medicare Other | Admitting: Gastroenterology

## 2015-07-13 ENCOUNTER — Telehealth: Payer: Self-pay | Admitting: Gastroenterology

## 2015-07-13 ENCOUNTER — Encounter: Payer: Self-pay | Admitting: Gastroenterology

## 2015-07-13 VITALS — BP 100/62 | HR 72 | Ht 65.0 in | Wt 188.0 lb

## 2015-07-13 DIAGNOSIS — K589 Irritable bowel syndrome without diarrhea: Secondary | ICD-10-CM | POA: Diagnosis not present

## 2015-07-13 NOTE — Progress Notes (Signed)
[name]    [id number]    [birth year]  Primary Care Physician:LITTLE,KEVIN Sylvia Ehlers, MD  Referring Physician: [name], MD [place], [place] [address]  Chief complaint:  IBS-diarrhea HPI: 75 year old white female with a history of diarrhea predominant irritable bowel syndrome here for follow-up visit. Per patient her symptoms have been controlled for many years with tranxene 3.75 mg up to 3 times a day. She no longer has significant diarrhea and is having regular bowel movements. She has not taken Lomotil or Imodium for many years now as her symptoms have been well-controlled.  Her last colonoscopy was in August 2011, polypoid normal mucosa & hyperplastic polyp was removed, due for recall colonoscopy in 2021. Denies any nausea, vomiting, abdominal pain, melena or bright red blood per rectum   Outpatient Encounter Prescriptions as of 07/13/2015  Medication Sig  . Ascorbic Acid (VITAMIN C) 500 MG tablet Take 500 mg by mouth daily.    . Calcium Carbonate-Vitamin D (CALCIUM 600-D) 600-400 MG-UNIT per tablet Take 1 tablet by mouth 2 (two) times daily.    . clorazepate (TRANXENE) 3.75 MG tablet TAKE 1 TABLET BY MOUTH DAILY.MAY TAKE UP TO 2 DAILY IF NEEDED  . cyclobenzaprine (FLEXERIL) 10 MG tablet 1/2 - 1 tablet by mouth at bedtime    . glucosamine-chondroitin 500-400 MG tablet Take 1 tablet by mouth 2 (two) times daily. Triple strength  . HYDROcodone-acetaminophen (VICODIN) 5-500 MG per tablet Take 1 tablet by mouth every 6 (six) hours as needed.  [name] levothyroxine (SYNTHROID, LEVOTHROID) 88 MCG tablet Take 88 mcg by mouth daily.    . Multiple Vitamin (MULTIVITAMIN) tablet Take 1 tablet by mouth daily.    . pioglitazone (ACTOS) 15 MG tablet Take 15 mg by mouth daily.  . pravastatin (PRAVACHOL) 10 MG tablet Take 10 mg by mouth daily.  . verapamil (CALAN) 120 MG tablet 1/2 tab by mouth twice daily    . vitamin B-12 (CYANOCOBALAMIN) 100 MCG tablet Take 100 mcg by  mouth daily.  . [DISCONTINUED] cetirizine (ZYRTEC) 10 MG tablet Take 10 mg by mouth daily.   No facility-administered encounter medications on file as of 07/13/2015.    Allergies as of 07/13/2015 - Review Complete 07/13/2015  Allergen Reaction Noted  . Clarithromycin  03/04/2008  . Cortisone  03/04/2008  . Penicillins  03/04/2008  . Povidone-iodine      Past Medical History  Diagnosis Date  . Stenosis of rectum and anus   . IBS (irritable bowel syndrome)   . Chronic diarrhea   . ILD (interstitial lung disease) ([place])   . Bronchiectasis     PFTs August 23, 2010 VC 82%   dlco 62 > 129 CORRECTED  . Chronic rhinitis     sinus CT ordered August 23, 2010  . Hyperplastic colon polyp   . Diabetes mellitus [facility])     Past Surgical History  Procedure Laterality Date  . Hand surgery  1991    left hand; Baker's Cyst removed  . Dilation and curettage of uterus    . Cholecystectomy    . Tear duct surgery      Family History  Problem Relation Age of Onset  . Colon polyps Sister   . Diabetes Sister   . Heart disease Mother   . Colon cancer Neg Hx     Social History   Social History  . Marital Status: Married    Spouse Name: N/A  . Number  of Children: N/A  . Years of Education: N/A   Occupational History  . retired    Social History Main Topics  . Smoking status: Former Smoker -- 3.00 packs/day for 15 years    Types: Cigarettes    Quit date: 06/11/1976  . Smokeless tobacco: Never Used  . Alcohol Use: No  . Drug Use: No  . Sexual Activity: Not on file   Other Topics Concern  . Not on file   Social History Narrative   Daily caffeine use      Review of systems: Review of Systems  Constitutional: Negative for fever and chills.  HENT: Negative.   Eyes: Negative for blurred vision.  Respiratory: Negative for cough, shortness of breath and wheezing.   Cardiovascular: Negative for chest pain and palpitations.  Gastrointestinal: as per HPI Genitourinary: Negative for  dysuria, urgency, frequency and hematuria.  Musculoskeletal: Negative for myalgias, back pain and joint pain.  Skin: Negative for itching and rash.  Neurological: Negative for dizziness, tremors, focal weakness, seizures and loss of consciousness.  Endo/Heme/Allergies: Negative for environmental allergies.  Psychiatric/Behavioral: Negative for depression, suicidal ideas and hallucinations.  All other systems reviewed and are negative.   Physical Exam: Filed Vitals:   07/13/15 1402  BP: 100/62  Pulse: 72   Gen:      No acute distress HEENT:  EOMI, sclera anicteric Neuro: alert and oriented x 3 Psych: normal mood and affect  Data Reviewed: Colonoscopy 01/2010     1) Diminutive polyp  2) Otherwise normal examination  s/p random biopsies 1. COLON, BIOPSY, RANDOM : BENIGN COLONIC MUCOSA.NO SIGNIFICANT INFLAMMATION OR OTHER ABNORMALITIES IDENTIFIED. 2. RECTUM, POLYP(S), : HYPERPLASTIC POLYP NO ADENOMATOUS CHANGES OF MALIGNANCY IDENTIFIED.  Assessment and Plan/Recommendations: 75 year old female with history of anxiety disorder and IBS predominant diarrhea who is being managed by Dr. Olevia Perches previously here for follow-up visit Discussed with patient and her husband that Clorazepate is not a medication used for diarrhea and treating her anxiety with Clorazepate is out of my scope of practice While I was discussing slow taper down of Clorazepate, she got very upset and got ready to leave the room.  She was unwilling to listen regarding the possible potential side effect of concomitant use of benzodiazepine and opiods in elderly patient. She is currently on Vicodin and Flexeril in addition to clorazepate She doesn't wish to try any other medication for IBS predominant diarrhea, she thinks she is already on lots of medications and doesn't want to try anything new but at the same time is not willing to taper off clorazepate. Return as needed  K. Denzil Magnuson , MD [phone] Mon-Fri  8a-5p [phone] after 5p, weekends, holidays

## 2015-07-13 NOTE — Telephone Encounter (Signed)
FYI Dr Havery Moros- Patient was in the office to see Dr Silverio Decamp and demanded refills of Tranxene, Dr Silverio Decamp was going to have her taper off and the patient snapped and walked out of the exam room. Said she wants another doctor NOW!

## 2015-07-13 NOTE — Telephone Encounter (Signed)
Tranxene is not a medication that is given routinely based on our clinic policy and falls outside the scope of a gastroenterology practice. If she wishes to have this medication chronically it will need to come from her primary care or a psychiatrist. If she takes this chronically she cannot just stop it and we can give her a refill or way to taper off as Dr. Silverio Decamp has already offered, but I would see her only to manage her GI problems and will not prescribe this medication, as Dr. Silverio Decamp as already discussed with her.

## 2015-07-13 NOTE — Telephone Encounter (Signed)
Please advise Dr Havery Moros.

## 2015-07-14 NOTE — Telephone Encounter (Signed)
I have left message for the patient to call back 

## 2015-07-14 NOTE — Telephone Encounter (Signed)
Patient is advised. She declines to schedule an appointment.

## 2015-08-23 ENCOUNTER — Telehealth: Payer: Self-pay | Admitting: Internal Medicine

## 2015-08-23 NOTE — Telephone Encounter (Signed)
Spoke with pt. She has an upcoming appointment with [facility] on 10/20/15 for an allergy consult. Pt wanted to know if she could be placed on a cancellation list, advised that we do not have a cancellation list. She is welcome to call back if see if anyone has canceled. She agreed and verbalized understanding.

## 2015-10-18 ENCOUNTER — Institutional Professional Consult (permissible substitution): Payer: Medicare Other | Admitting: Internal Medicine

## 2015-10-20 ENCOUNTER — Encounter: Payer: Self-pay | Admitting: Internal Medicine

## 2015-10-20 ENCOUNTER — Ambulatory Visit (INDEPENDENT_AMBULATORY_CARE_PROVIDER_SITE_OTHER): Payer: Medicare Other | Admitting: Internal Medicine

## 2015-10-20 VITALS — BP 124/74 | HR 78 | Ht 66.0 in | Wt 193.6 lb

## 2015-10-20 DIAGNOSIS — Z9109 Other allergy status, other than to drugs and biological substances: Secondary | ICD-10-CM

## 2015-10-20 DIAGNOSIS — Z91048 Other nonmedicinal substance allergy status: Secondary | ICD-10-CM

## 2015-10-20 NOTE — Patient Instructions (Signed)
Im sorry I won't be able to help you today, but I think you will be better served by seeing one of the other allergy practices in town, since I will be retiring.    Consider Drs Bobby Rumpf and Kozlow , allergists on [address] across from [facility]

## 2015-10-20 NOTE — Assessment & Plan Note (Signed)
Recommend she seek evaluation and management as appropriate from an ongoing allergy practice in town since we will be winding down here. She agreed and appreciated the explanation.

## 2015-10-20 NOTE — Progress Notes (Signed)
10/20/2015- 75 year old female former smoker referred courtesy of Dr.Ramaswamy/Kevin Little referred patient. ? Allergies vs GERD for cough. Being followed for IPF complicated by depression, IBS, chronic rhinitis, GERD I talked with her without establishing. She wanted allergy testing and anticipated allergy vaccine injections. Since we are winding down the allergy clinic at this site, after discussion, we suggested she start with an ongoing allergy practice in town rather than "changing horses". She is not having acute symptoms  ROS-N/A OBJ- Physical Exam -N/A

## 2016-03-06 ENCOUNTER — Emergency Department (HOSPITAL_COMMUNITY)
Admission: EM | Admit: 2016-03-06 | Discharge: 2016-03-06 | Disposition: A | Discharge status: Home / Self Care | Payer: Medicare Other | Attending: Emergency Medicine | Admitting: Emergency Medicine

## 2016-03-06 ENCOUNTER — Encounter (HOSPITAL_COMMUNITY): Payer: Self-pay

## 2016-03-06 DIAGNOSIS — Z79899 Other long term (current) drug therapy: Secondary | ICD-10-CM | POA: Insufficient documentation

## 2016-03-06 DIAGNOSIS — R197 Diarrhea, unspecified: Secondary | ICD-10-CM | POA: Insufficient documentation

## 2016-03-06 DIAGNOSIS — Z87891 Personal history of nicotine dependence: Secondary | ICD-10-CM | POA: Insufficient documentation

## 2016-03-06 DIAGNOSIS — E119 Type 2 diabetes mellitus without complications: Secondary | ICD-10-CM | POA: Insufficient documentation

## 2016-03-06 LAB — BASIC METABOLIC PANEL
ANION GAP: 8 (ref 5–15)
BUN: 21 mg/dL — ABNORMAL HIGH (ref 6–20)
CO2: 27 mmol/L (ref 22–32)
Calcium: 9.8 mg/dL (ref 8.9–10.3)
Chloride: 104 mmol/L (ref 101–111)
Creatinine, Ser: 0.76 mg/dL (ref 0.44–1.00)
GFR calc Af Amer: >60 mL/min (ref >60)
GLUCOSE: 120 mg/dL — AB (ref 65–99)
POTASSIUM: 4.6 mmol/L (ref 3.5–5.1)
Sodium: 139 mmol/L (ref 135–145)

## 2016-03-06 LAB — CBC WITH DIFFERENTIAL/PLATELET
Basophils Absolute: 0 10*3/uL (ref 0.0–0.1)
Basophils Relative: 0 %
EOS ABS: 0 10*3/uL (ref 0.0–0.7)
EOS PCT: 1 %
HEMATOCRIT: 42.2 % (ref 36.0–46.0)
Hemoglobin: 14.3 g/dL (ref 12.0–15.0)
LYMPHS ABS: 0.8 10*3/uL (ref 0.7–4.0)
LYMPHS PCT: 13 %
MCH: 31.6 pg (ref 26.0–34.0)
MCHC: 33.9 g/dL (ref 30.0–36.0)
MCV: 93.4 fL (ref 78.0–100.0)
MONO ABS: 0.3 10*3/uL (ref 0.1–1.0)
MONOS PCT: 5 %
Neutro Abs: 5.2 10*3/uL (ref 1.7–7.7)
Neutrophils Relative %: 81 %
PLATELETS: 171 10*3/uL (ref 150–400)
RBC: 4.52 MIL/uL (ref 3.87–5.11)
RDW: 12.9 % (ref 11.5–15.5)
WBC: 6.3 10*3/uL (ref 4.0–10.5)

## 2016-03-06 LAB — CBG MONITORING, ED: Glucose-Capillary: 142 mg/dL — ABNORMAL HIGH (ref 65–99)

## 2016-03-06 MED ORDER — SODIUM CHLORIDE 0.9 % IV BOLUS (SEPSIS)
1000.0000 mL | Freq: Once | INTRAVENOUS | Status: AC
  Administered 2016-03-06: 1000 mL via INTRAVENOUS

## 2016-03-06 MED ORDER — DIPHENOXYLATE-ATROPINE 2.5-0.025 MG PO TABS: 2.0000 | ORAL_TABLET | Freq: Two times a day (BID) | ORAL | 0 refills | Status: DC | PRN

## 2016-03-06 MED ORDER — ONDANSETRON HCL 4 MG/2ML IJ SOLN
4.0000 mg | Freq: Once | INTRAMUSCULAR | Status: AC
  Administered 2016-03-06: 4 mg via INTRAVENOUS
  Filled 2016-03-06: qty 2

## 2016-03-06 MED ORDER — DIPHENOXYLATE-ATROPINE 2.5-0.025 MG PO TABS
1.0000 | ORAL_TABLET | Freq: Once | ORAL | Status: AC
  Administered 2016-03-06: 1 via ORAL
  Filled 2016-03-06: qty 1

## 2016-03-06 NOTE — ED Notes (Signed)
Bed: WA10 Expected date:  Expected time:  Means of arrival:  Comments: EMS 

## 2016-03-06 NOTE — Discharge Instructions (Signed)
Please continue drinking plenty of fluids. Likely GI infection. May take the Lomotil as prescribed. Do not take for longer than 48 hours. Please return to the ED if your symptoms worsen or fail to improve. May follow up with primary care doctor outpatient for further workup.

## 2016-03-06 NOTE — ED Provider Notes (Signed)
[facility] DEPT Provider Note   CSN: [id number] Arrival date & time: 03/06/16  1232     History   Chief Complaint No chief complaint on file.   HPI [name] is a 75 y.o. female.  75 year old Caucasian female past medical history significant for chronic diarrhea, diabetes, hypertension, IBS that presents to the ED today by EMS from home with diarrhea. Patient has had approximately 20 episodes of diarrhea since this morning. States her stool is watery. Denies foul-smelling. Patient states that she has history of chronic diarrhea. Patient was on Lomotil several years ago but stopped due to insurance change. Patient has taken 4 Imodium this morning with little relief. Patient has had no episodes of diarrhea while in ED. Patient called her PCP to ask if they could call her in some Lomotil. They stated that they did not fill comfortable due to her comorbidities and that she should come to the ED for rehydration. Patient denies any recent antibiotic use. Denies any recent sick contacts. Patient states that she had some abdominal cramping this morning during her episodes of diarrhea. He denies any abdominal pain this time. Denies any fever, chills, headache, vision changes, chest pain, palpitations, shortness of breath, lightheadedness, dizziness, abdominal pain, urinary symptoms, hematochezia, melena.      Past Medical History:  Diagnosis Date  . Bronchiectasis    PFTs August 23, 2010 VC 82%   dlco 62 > 129 CORRECTED  . Chronic diarrhea   . Chronic rhinitis    sinus CT ordered August 23, 2010  . Diabetes mellitus ([place])   . Hyperplastic colon polyp   . IBS (irritable bowel syndrome)   . ILD (interstitial lung disease) ([place])   . Stenosis of rectum and anus     Patient Active Problem List   Diagnosis Date Noted  . Environmental allergies 02/07/2015  . History of multiple allergies 02/07/2015  . IPF (idiopathic pulmonary fibrosis) ([place]) 07/15/2014  . Chronic cough 02/11/2014  .  ILD (interstitial lung disease) ([place]) 11/18/2013  . Chronic rhinitis 10/02/2010  . GERD (gastroesophageal reflux disease) 10/02/2010  . INTERSTITIAL LUNG DISEASE 07/10/2010  . DEPRESSION/ANXIETY 04/12/2008  . IRRITABLE BOWEL SYNDROME 10/09/2007  . STENOSIS OF RECTUM AND ANUS 10/09/2007  . DIARRHEA, CHRONIC 10/09/2007    Past Surgical History:  Procedure Laterality Date  . CHOLECYSTECTOMY    . DILATION AND CURETTAGE OF UTERUS    . HAND SURGERY  1991   left hand; Baker's Cyst removed  . migraine    . tear duct surgery      OB History    No data available       Home Medications    Prior to Admission medications   Medication Sig Start Date End Date Taking? Authorizing Provider  Ascorbic Acid (VITAMIN C) 500 MG tablet Take 500 mg by mouth daily.      Historical Provider, MD  Biotin 1 MG CAPS Take by mouth.    Historical Provider, MD  Calcium Carbonate-Vitamin D (CALCIUM 600-D) 600-400 MG-UNIT per tablet Take 1 tablet by mouth 2 (two) times daily.      Historical Provider, MD  clorazepate (TRANXENE) 3.75 MG tablet TAKE 1 TABLET BY MOUTH DAILY.MAY TAKE UP TO 2 DAILY IF NEEDED 05/10/15   Mauri Pole, MD  cyclobenzaprine (FLEXERIL) 10 MG tablet 1/2 - 1 tablet by mouth at bedtime      Historical Provider, MD  glucosamine-chondroitin 500-400 MG tablet Take 1 tablet by mouth 2 (two) times daily. Triple strength  Historical Provider, MD  HYDROcodone-acetaminophen (VICODIN) 5-500 MG per tablet Take 1 tablet by mouth every 6 (six) hours as needed. 01/01/11   Tanda Rockers, MD  levothyroxine (SYNTHROID, LEVOTHROID) 88 MCG tablet Take 88 mcg by mouth daily.      Historical Provider, MD  Multiple Vitamin (MULTIVITAMIN) tablet Take 1 tablet by mouth daily.      Historical Provider, MD  pioglitazone (ACTOS) 15 MG tablet Take 15 mg by mouth daily.    Historical Provider, MD  pravastatin (PRAVACHOL) 10 MG tablet Take 10 mg by mouth daily.    Historical Provider, MD  verapamil (CALAN) 120  MG tablet 1/2 tab by mouth twice daily      Historical Provider, MD  vitamin B-12 (CYANOCOBALAMIN) 100 MCG tablet Take 100 mcg by mouth daily.    Historical Provider, MD    Family History Family History  Problem Relation Age of Onset  . Colon polyps Sister   . Diabetes Sister   . Heart disease Mother   . Colon cancer Neg Hx     Social History Social History  Substance Use Topics  . Smoking status: Former Smoker    Packs/day: 3.00    Years: 15.00    Types: Cigarettes    Quit date: 06/11/1976  . Smokeless tobacco: Never Used  . Alcohol use No     Allergies   Clarithromycin; Cortisone; Penicillins; and Povidone-iodine   Review of Systems Review of Systems  Constitutional: Negative for chills and fever.  HENT: Negative for congestion, ear pain, rhinorrhea and sore throat.   Eyes: Negative for pain and visual disturbance.  Respiratory: Negative for cough and shortness of breath.   Cardiovascular: Negative for chest pain, palpitations and leg swelling.  Gastrointestinal: Positive for diarrhea. Negative for abdominal pain, blood in stool, constipation, nausea and vomiting.  Genitourinary: Negative for dysuria, flank pain, frequency, hematuria and urgency.  Musculoskeletal: Negative for arthralgias and back pain.  Skin: Negative for color change and rash.  Neurological: Negative for dizziness, syncope, weakness, light-headedness, numbness and headaches.  All other systems reviewed and are negative.    Physical Exam Updated Vital Signs BP 127/65 (BP Location: Left Arm)   Pulse 90   Temp 98.1 F (36.7 C) (Oral)   Resp 18   Ht 5' 5.5" (1.664 m)   Wt 86.2 kg   SpO2 98%   BMI 31.14 kg/m   Physical Exam  Constitutional: She appears well-developed and well-nourished. No distress.  HENT:  Head: Normocephalic and atraumatic.  Mouth/Throat: Oropharynx is clear and moist and mucous membranes are normal.  Eyes: Conjunctivae are normal. Right eye exhibits no discharge. Left  eye exhibits no discharge. No scleral icterus.  Conjunctive without pallor.  Neck: Normal range of motion. Neck supple. No thyromegaly present.  Cardiovascular: Normal rate, regular rhythm, normal heart sounds and intact distal pulses.   No tachycardia noted. Normotensive.   Pulmonary/Chest: Effort normal and breath sounds normal. No respiratory distress. She has no wheezes. She has no rales.  Abdominal: Soft. Bowel sounds are normal. She exhibits no distension. There is no tenderness. There is no rebound and no guarding.  Musculoskeletal: Normal range of motion. She exhibits no edema.  Lymphadenopathy:    She has no cervical adenopathy.  Neurological: She is alert.  Skin: Skin is warm and dry. Capillary refill takes less than 2 seconds. No pallor.  Nursing note and vitals reviewed.    ED Treatments / Results  Labs (all labs ordered are listed, but only  abnormal results are displayed) Labs Reviewed  BASIC METABOLIC PANEL - Abnormal; Notable for the following:       Result Value   Glucose, Bld 120 (*)    BUN 21 (*)    All other components within normal limits  CBG MONITORING, ED - Abnormal; Notable for the following:    Glucose-Capillary 142 (*)    All other components within normal limits  CBC WITH DIFFERENTIAL/PLATELET  URINALYSIS, ROUTINE W REFLEX MICROSCOPIC (NOT AT [facility])    EKG  EKG Interpretation None       Radiology No results found.  Procedures Procedures (including critical care time)  Medications Ordered in ED Medications  sodium chloride 0.9 % bolus 1,000 mL (1,000 mLs Intravenous New Bag/Given 03/06/16 1440)  ondansetron (ZOFRAN) injection 4 mg (4 mg Intravenous Given 03/06/16 1440)     Initial Impression / Assessment and Plan / ED Course  I have reviewed the triage vital signs and the nursing notes.  Pertinent labs & imaging results that were available during my care of the patient were reviewed by me and considered in my medical decision making (see  chart for details).  Clinical Course  Patient presents to the ED with diarrhea that started this AM. Patient with history of chronic diarrhea. Patient was on lomotil previously before change of insurance. Labs are unremarkable. Fluids given in ED. Patient has had no episodes of diarrhea while in the ED. Given dose of lomotil in ED. Patient exam is unremarkable. Hemodynamically stable. Patient without any other complaints including abd pain imaging not indicated. Patient feels much improved after fluids. She is able to keep po fluids down. Able to urinate. Likely chronic diarrhea. No recent abx use low concern for c diff. Patient would like a prescription for lomotil. Patient seen and evaluated by DR. Jeneen Rinks who agrees with plan. Patient will be discharged home in NAD with stable VS. Strict return precautions given. Patient verbalizes understanding with plan of care.    Final Clinical Impressions(s) / ED Diagnoses   Final diagnoses:  Diarrhea, unspecified type    New Prescriptions Discharge Medication List as of 03/06/2016  4:11 PM    START taking these medications   Details  diphenoxylate-atropine (LOMOTIL) 2.5-0.025 MG tablet Take 2 tablets by mouth 2 (two) times daily as needed for diarrhea or loose stools., Starting Tue 03/06/2016, Print         Doristine Devoid, PA-C 03/07/16 [place], MD 03/10/16 [phone]

## 2016-03-06 NOTE — ED Notes (Signed)
Zofran not given. Clicked given by accident. Patient refused Zofran. Patient stated, "i do not need that."

## 2016-03-06 NOTE — ED Triage Notes (Signed)
Per EMS- patient states she woke this AM feeling weak and having diarrhea. Patient called her PCP and was told to come to the ED for fluids.

## 2016-03-06 NOTE — ED Provider Notes (Signed)
Patient seen and evaluated. Discussed with PA-C. Patient was reassuring labs per noticeable diarrhea here after Lomotil. Given IV fluids. No dry mouth. Has urinated. Agree with evaluation and care plan as outlined above.   Tanna Furry, MD 03/06/16 [phone]

## 2016-07-24 ENCOUNTER — Ambulatory Visit: Payer: Self-pay | Admitting: Allergy and Immunology

## 2016-09-18 ENCOUNTER — Ambulatory Visit: Payer: Self-pay | Admitting: Allergy and Immunology

## 2016-10-23 ENCOUNTER — Encounter (INDEPENDENT_AMBULATORY_CARE_PROVIDER_SITE_OTHER): Payer: Self-pay

## 2016-10-23 ENCOUNTER — Ambulatory Visit (INDEPENDENT_AMBULATORY_CARE_PROVIDER_SITE_OTHER): Payer: Medicare Other | Admitting: Allergy and Immunology

## 2016-10-23 ENCOUNTER — Encounter: Payer: Self-pay | Admitting: Allergy and Immunology

## 2016-10-23 VITALS — BP 154/76 | HR 72 | Temp 97.7°F | Resp 24 | Ht 64.7 in | Wt 194.4 lb

## 2016-10-23 DIAGNOSIS — J849 Interstitial pulmonary disease, unspecified: Secondary | ICD-10-CM | POA: Diagnosis not present

## 2016-10-23 DIAGNOSIS — K219 Gastro-esophageal reflux disease without esophagitis: Secondary | ICD-10-CM | POA: Diagnosis not present

## 2016-10-23 DIAGNOSIS — J3089 Other allergic rhinitis: Secondary | ICD-10-CM | POA: Diagnosis not present

## 2016-10-23 MED ORDER — MONTELUKAST SODIUM 10 MG PO TABS: ORAL_TABLET | ORAL | 5 refills | Status: DC

## 2016-10-23 MED ORDER — DEXLANSOPRAZOLE 60 MG PO CPDR: DELAYED_RELEASE_CAPSULE | ORAL | 5 refills | Status: DC

## 2016-10-23 MED ORDER — RANITIDINE HCL 300 MG PO TABS: ORAL_TABLET | ORAL | 5 refills | Status: DC

## 2016-10-23 NOTE — Progress Notes (Signed)
Dear Dr. Rex Kras,  Thank you for referring [name] to the [facility] on 10/23/2016.   Below is a summation of this patient's evaluation and recommendations.  Thank you for your referral. I will keep you informed about this patient's response to treatment.   If you have any questions please do not hesitate to contact me.   Sincerely,  Jiles Prows, MD Allergy / Immunology [place]   ______________________________________________________________________    NEW PATIENT NOTE  Referring Provider: [name], MD Primary Provider: [name], MD Date of office visit: 10/23/2016    Subjective:   Chief Complaint:  [name] (DOB: 1940-11-20) is a 76 y.o. female who presents to the clinic on 10/23/2016 with a chief complaint of Cough ("Feels like it's all in her throat" - Drainage) .     HPI: [name] presents to this clinic in evaluation of cough. [name] been coughing for over 1-1/2 years. She has undergone extensive evaluation by the pulmonology group in [place] and has been given the diagnosis of interstitial lung disease with unknown etiology that appears to be stable over the course of the past several years.  [name]'s big problem is the fact that she can't stop coughing. She feels as though there is something "in her throat". She has throat clearing and a glob stuck in her throat and constant drainage in her throat and has to spit. She really has no other additional respiratory tract symptoms involving her chest. She does not get short of breath or have chest tightness or developed wheezing. She never coughs at nighttime.  She does have an issue with her upper airways. She has lots of nose blowing. She may occasionally have some nasal congestion. She does not have any sneezing or anosmia or decreased ability to taste or ugly nasal discharge. There is no obvious provoking  factor giving rise to this issue.  She does not have a history of reflux. She has been seen by a gastroenterologist who told her that she does not have any reflux. Apparently a discussion with Dr. Melvyn Novas in the past suggested the possibility of reflux-induced respiratory disease contributing to her cough but she never had any therapy for reflux that she actually used. She does drink 2 coffees in the morning and no other caffeine throughout the day and no chocolate and no alcohol.  [name] was a very heavy smoker at 3 packs per day from the age of [age] but has been tobacco free since that point in time.  Past Medical History:  Diagnosis Date  . Bronchiectasis    PFTs August 23, 2010 VC 82%   dlco 62 > 129 CORRECTED  . Chronic diarrhea   . Chronic rhinitis    sinus CT ordered August 23, 2010  . Diabetes mellitus ([place])   . Hyperplastic colon polyp   . IBS (irritable bowel syndrome)   . ILD (interstitial lung disease) ([place])   . Stenosis of rectum and anus     Past Surgical History:  Procedure Laterality Date  . CHOLECYSTECTOMY    . DILATION AND CURETTAGE OF UTERUS    . HAND SURGERY  1991   left hand; Baker's Cyst removed  . migraine    . tear duct surgery      Allergies as of 10/23/2016      Reactions   Penicillins Diarrhea   Has patient had a PCN reaction causing  immediate rash, facial/tongue/throat swelling, SOB or lightheadedness with hypotension: Unknown Has patient had a PCN reaction causing severe rash involving mucus membranes or skin necrosis: Unknown Has patient had a PCN reaction that required hospitalization: No  Has patient had a PCN reaction occurring within the last 10 years: No  If all of the above answers are "NO", then may proceed with Cephalosporin use.   Clarithromycin    Unknown reaction    Cortisone Other (See Comments)   "Made her feel like ice water was running through her veins"    Povidone-iodine    Unknown reaction       Medication List        acetaminophen 500 MG tablet Commonly known as:  TYLENOL Take 1,000 mg by mouth at bedtime.   BIOTIN PO Take 1 tablet by mouth daily.   CALCIUM 600-D 600-400 MG-UNIT tablet Generic drug:  Calcium Carbonate-Vitamin D Take 1 tablet by mouth at bedtime.   clorazepate 3.75 MG tablet Commonly known as:  TRANXENE TAKE 1 TABLET BY MOUTH DAILY.MAY TAKE UP TO 2 DAILY IF NEEDED   cyclobenzaprine 10 MG tablet Commonly known as:  FLEXERIL Take 10 mg by mouth at bedtime.   glucosamine-chondroitin 500-400 MG tablet Take 1 tablet by mouth 2 (two) times daily. Triple strength   levothyroxine 88 MCG tablet Commonly known as:  SYNTHROID, LEVOTHROID Take 88 mcg by mouth daily.   multivitamin tablet Take 1 tablet by mouth daily.   pioglitazone 15 MG tablet Commonly known as:  ACTOS Take 15 mg by mouth daily.   pravastatin 20 MG tablet Commonly known as:  PRAVACHOL Take 20 mg by mouth every evening.   verapamil 120 MG tablet Commonly known as:  CALAN Take 60 mg by mouth 2 (two) times daily.   VITAMIN B-12 PO Take 1 tablet by mouth daily.   vitamin C 500 MG tablet Commonly known as:  ASCORBIC ACID Take 500 mg by mouth at bedtime.       Review of systems negative except as noted in HPI / PMHx or noted below:  Review of Systems  Constitutional: Negative.   HENT: Negative.   Eyes: Negative.   Respiratory: Negative.   Cardiovascular: Negative.   Gastrointestinal: Negative.   Genitourinary: Negative.   Musculoskeletal: Negative.   Skin: Negative.   Neurological: Negative.   Endo/Heme/Allergies: Negative.   Psychiatric/Behavioral: Negative.     Family History  Problem Relation Age of Onset  . Colon polyps Sister   . Diabetes Sister   . Heart disease Mother   . Colon cancer Neg Hx     Social History   Social History  . Marital status: Married    Spouse name: N/A  . Number of children: N/A  . Years of education: N/A   Occupational History  . retired    Social  History Main Topics  . Smoking status: Former Smoker    Packs/day: 3.00    Years: 15.00    Types: Cigarettes    Quit date: 06/11/1976  . Smokeless tobacco: Never Used  . Alcohol use No  . Drug use: No  . Sexual activity: Not on file   Other Topics Concern  . Not on file   Social History Narrative   Daily caffeine use    Environmental and Social history  Lives in a house with a dry environment, cats located inside the household, no carpeting in the bedroom, plastic on both the bed and the pillow, no smoking ongoing inside the household.  Objective:   Vitals:   10/23/16 1413  BP: (!) 154/76  Pulse: 72  Resp: (!) 24  Temp: 97.7 F (36.5 C)   Height: 5' 4.7" (164.3 cm) Weight: 194 lb 6.4 oz (88.2 kg)  Physical Exam  Diagnostics: Allergy skin tests were performed. She did not demonstrate any hypersensitivity against a screening panel of aeroallergens or foods  Results of blood tests obtained 03/06/2016 identified a white blood cell count of 6.3 with a normal differential and an eosinophil count of 0, hemoglobin 14.3, platelet 171.  Results of a high-resolution chest CT scan obtained for January 2016 identifies the following:  Mediastinum/Lymph Nodes: Heart size is normal. There is no significant pericardial fluid, thickening or pericardial calcification. Multiple borderline enlarged and minimally enlarged mediastinal and hilar lymph nodes, largest of which measures only 11 mm in the lower right paratracheal station (unchanged). Esophagus is unremarkable in appearance.  Lungs/Pleura: High-resolution images again demonstrates areas of subpleural reticulation, architectural distortion peripheral bronchiolectasis, and a few areas suspicious for mild honeycombing. No craniocaudal gradient. The severity of these changes is stable compared to the recent prior examination from 11/04/2013. Inspiratory and expiratory imaging demonstrates a few areas of mild air trapping,  indicative of mild small airways disease. No acute consolidative airspace disease. No pleural effusions. No suspicious appearing pulmonary nodules or masses.  Results of lung volumes obtained for September 2016 identified TLC 70%, RV 52%, DLCO/VA 90%  Assessment and Plan:    1. Other allergic rhinitis   2. LPRD (laryngopharyngeal reflux disease)   3. Interstitial lung disease ([place])     1.  Allergen avoidance measures?  2. Treat and prevent inflammation:   A. OTC Nasacort 1 spray each nostril once a day  B. montelukast 10 mg tablet 1 time per day  3. Treat and prevent reflux:   A. consolidate caffeine as much as possible - aim for none  B. Dexilant 60 mg - one tablet in AM  C. ranitidine 300 mg - one tablet in PM  4. Try to replace all throat clearing maneuvers with a swallowing maneuver  5. Return to clinic in 4 weeks or earlier if problem.  Other Adajah appears to have some degree of inflammation affecting her upper airway and has some form of insult to her lower airway that has changed the architecture of this airway I think that the overriding issue giving rise to her unrelenting coughing associated with throat symptoms is reflux in the form of LPR and I will aggressively treat her for this condition with the therapy noted above over the course of the next 4 weeks and make a decision about further evaluation and treatment pending her response.  Jiles Prows, MD [facility]

## 2016-10-23 NOTE — Patient Instructions (Addendum)
  1.  Allergen avoidance measures?  2. Treat and prevent inflammation:   A. OTC Nasacort 1 spray each nostril once a day  B. montelukast 10 mg tablet 1 time per day  3. Treat and prevent reflux:   A. consolidate caffeine as much as possible - aim for none  B. Dexilant 60 mg - one tablet in AM  C. ranitidine 300 mg - one tablet in PM  4. Try to replace all throat clearing maneuvers with a swallowing maneuver  5. Return to clinic in 4 weeks or earlier if problem.

## 2016-11-27 ENCOUNTER — Ambulatory Visit: Payer: Medicare Other | Admitting: Allergy and Immunology

## 2017-04-18 ENCOUNTER — Other Ambulatory Visit: Payer: Self-pay | Admitting: Family Medicine

## 2017-04-18 DIAGNOSIS — R05 Cough: Secondary | ICD-10-CM

## 2017-04-18 DIAGNOSIS — R0602 Shortness of breath: Secondary | ICD-10-CM

## 2017-04-18 DIAGNOSIS — R911 Solitary pulmonary nodule: Secondary | ICD-10-CM

## 2017-04-18 DIAGNOSIS — R059 Cough, unspecified: Secondary | ICD-10-CM

## 2017-05-10 ENCOUNTER — Ambulatory Visit
Admission: RE | Admit: 2017-05-10 | Discharge: 2017-05-10 | Disposition: A | Discharge status: Home / Self Care | Payer: Medicare Other | Source: Ambulatory Visit | Attending: Family Medicine | Admitting: Family Medicine

## 2017-05-10 DIAGNOSIS — R059 Cough, unspecified: Secondary | ICD-10-CM

## 2017-05-10 DIAGNOSIS — R0602 Shortness of breath: Secondary | ICD-10-CM

## 2017-05-10 DIAGNOSIS — R911 Solitary pulmonary nodule: Secondary | ICD-10-CM

## 2017-05-10 DIAGNOSIS — R05 Cough: Secondary | ICD-10-CM

## 2017-05-10 MED ORDER — IOPAMIDOL (ISOVUE-300) INJECTION 61%
75.0000 mL | Freq: Once | INTRAVENOUS | Status: AC | PRN
  Administered 2017-05-10: 75 mL via INTRAVENOUS

## 2017-05-30 ENCOUNTER — Ambulatory Visit (INDEPENDENT_AMBULATORY_CARE_PROVIDER_SITE_OTHER): Payer: Medicare Other | Admitting: Infectious Diseases

## 2017-05-30 ENCOUNTER — Encounter: Payer: Self-pay | Admitting: Infectious Diseases

## 2017-05-30 VITALS — BP 133/71 | HR 80 | Temp 97.0°F | Wt 185.0 lb

## 2017-05-30 DIAGNOSIS — R053 Chronic cough: Secondary | ICD-10-CM

## 2017-05-30 DIAGNOSIS — R05 Cough: Secondary | ICD-10-CM

## 2017-05-30 DIAGNOSIS — R768 Other specified abnormal immunological findings in serum: Secondary | ICD-10-CM | POA: Diagnosis not present

## 2017-05-30 DIAGNOSIS — Z9109 Other allergy status, other than to drugs and biological substances: Secondary | ICD-10-CM | POA: Diagnosis not present

## 2017-05-30 DIAGNOSIS — R059 Cough, unspecified: Secondary | ICD-10-CM

## 2017-05-30 DIAGNOSIS — J8410 Pulmonary fibrosis, unspecified: Secondary | ICD-10-CM | POA: Diagnosis not present

## 2017-05-30 NOTE — Progress Notes (Signed)
[name] 07-Mar-1941 [id number] PCP: [name], MD  Referring Provider:  Genelle Bal, NP [name])  Reason for Visit: Chronic Interstitial Lung Disease, cough    HPI: [name] is a 76 y.o. female with  has a past medical history of Bronchiectasis, Chronic diarrhea, Chronic rhinitis, Diabetes mellitus ([place]), Hyperplastic colon polyp, IBS (irritable bowel syndrome), ILD (interstitial lung disease) ([place]), and Stenosis of rectum and anus. Previous heavy 3ppd smoker until the age of ~ 21.   Her biggest complaint is chronic persistent coughing. Cough started in 2008 and was initially a cyclic problem where she would only experience it in the winter months. In 2016 Makailyn began to experience daily symptoms and has been to several medical providers trying to determine the cause including pulmonology (through 2016) and most recently allergy/asthma specialists in May of this year. She describes her cough to be non-productive and occurs "multiple times a day and uncontrollable" causing severe urinary incontinence, fatigue and soreness. She feels as if it originates from throat and not her chest. Interestingly she is able to sleep at night with minimal effect from this but states symptoms are worse after she begins eating.   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 She has not implemented any of these measures and disagrees with the diagnosis. Was also told at this appointment that the HRCT scan she underwent in Nov 2018 showed 'no reason for her continued symptoms.' Constantly has drainage down her throat and feeling of a foreign-body like a hair in her  throat.   She is a patient of Dr. Golden Pop at [facility]. She tells me that she is frustrated because 'they do nothing but check walking oxygenation tests and cannot figure out what is wrong' and not interested in going back there. 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 Never wore a mask while working there. She and her husband tell me that Dr. [name] wanted her to undergo a BAL/lung biopsy for more definitive testing to narrow down a diagnosis based on changes noted on CT scans - she has refused this as she does not believe the risk of the procedure is worth it because she thinks her problem is not her lungs. She is not currently on any inhaled maintenance medications. Her husband is wondering if exercise will help her lung issues.   She has constant drainage down the back of her throat. Has never been evaluated by ENT.   She tells me she has tried everything pulmonary team and allergy teams have suggested and has either had problems with tolerating the medications and she feels strongly mold is causing her issue and hopeful that we can prescribe an antibiotic to help her symptoms. She denies any fevers, chills, weight  loss, or recent/frequent antibiotic use.   Patient Active Problem List   Diagnosis Date Noted  . Environmental allergies 02/07/2015  . History of multiple allergies 02/07/2015  . IPF (idiopathic pulmonary fibrosis) ([place]) 07/15/2014  . Chronic cough 02/11/2014  . ILD (interstitial lung disease) ([place]) 11/18/2013  . Chronic rhinitis 10/02/2010    . GERD (gastroesophageal reflux disease) 10/02/2010  . INTERSTITIAL LUNG DISEASE 07/10/2010  . DEPRESSION/ANXIETY 04/12/2008  . IRRITABLE BOWEL SYNDROME 10/09/2007  . STENOSIS OF RECTUM AND ANUS 10/09/2007  . DIARRHEA, CHRONIC 10/09/2007   Review of Systems  Constitutional: Positive for malaise/fatigue. Negative for chills, diaphoresis, fever and weight loss.  HENT: Positive for congestion. Negative for hearing loss and tinnitus.   Eyes: Negative for blurred vision and photophobia.  Respiratory: Positive for cough. Negative for hemoptysis, sputum production, shortness of breath and wheezing.   Cardiovascular: Negative for chest pain, orthopnea and leg swelling.  Gastrointestinal: Negative for abdominal pain, diarrhea and vomiting.  Genitourinary: Negative for dysuria.  Musculoskeletal: Negative for myalgias.  Skin: Negative for rash.  Neurological: Negative for dizziness and headaches.  Endo/Heme/Allergies: Positive for environmental allergies.    Past Medical History:  Diagnosis Date  . Bronchiectasis    PFTs August 23, 2010 VC 82%   dlco 62 > 129 CORRECTED  . Chronic diarrhea   . Chronic rhinitis    sinus CT ordered August 23, 2010  . Diabetes mellitus ([place])   . Hyperplastic colon polyp   . IBS (irritable bowel syndrome)   . ILD (interstitial lung disease) ([place])   . Stenosis of rectum and anus     Social History   Tobacco Use  . Smoking status: Former Smoker    Packs/day: 3.00    Years: 15.00    Pack years: 45.00    Types: Cigarettes    Last attempt to quit: 06/11/1976    Years since quitting: 41.0  . Smokeless tobacco: Never Used  Substance Use Topics  . Alcohol use: No  . Drug use: No    Family History  Problem Relation Age of Onset  . Colon polyps Sister   . Diabetes Sister   . Heart disease Mother   . Colon cancer Neg Hx      Allergies  Allergen Reactions  . Penicillins Diarrhea    Has patient had a PCN reaction causing immediate rash,  facial/tongue/throat swelling, SOB or lightheadedness with hypotension: Unknown Has patient had a PCN reaction causing severe rash involving mucus membranes or skin necrosis: Unknown Has patient had a PCN reaction that required hospitalization: No  Has patient had a PCN reaction occurring within the last 10 years: No  If all of the above answers are "NO", then may proceed with Cephalosporin use.   . Clarithromycin     Unknown reaction   . Cortisone Other (See Comments)    "Made her feel like ice water was running through her veins"   . Povidone-Iodine     Unknown reaction      OBJECTIVE: Vitals:   05/30/17 1402  BP: 133/71  Pulse: 80  Temp: (!) 97 F (36.1 C)  TempSrc: Oral  SpO2: 95%  Weight: 185 lb (83.9 kg)   Body mass index is 31.07 kg/m.  Physical Exam  Constitutional: She is oriented to person, place, and time and well-developed, well-nourished, and in no distress.  Accompanied by her husband. Appears well today.   HENT:  Right Ear: External ear normal.  Left Ear: External ear normal.  Mouth/Throat: Oropharynx is  clear and moist.  Eyes: Pupils are equal, round, and reactive to light.  Neck: Normal range of motion.  Cardiovascular: Normal rate, regular rhythm and normal heart sounds.  Pulmonary/Chest: Effort normal. She has rales in the right middle field, the right lower field, the left middle field and the left lower field.  Abdominal: Soft. Bowel sounds are normal. She exhibits no distension.  Musculoskeletal: Normal range of motion.  Lymphadenopathy:    She has no cervical adenopathy.  Neurological: She is alert and oriented to person, place, and time.  Skin: Skin is warm and dry.  Psychiatric: Affect normal.  Tearful during visit and appears very frustrated with the impact of this problem on QOL.   Vitals reviewed.  LABS & DIAGNOSTICS:   Lab Results  Component Value Date   WBC 6.3 03/06/2016   HGB 14.3 03/06/2016   HCT 42.2 03/06/2016   MCV 93.4  03/06/2016   PLT 171 03/06/2016   Lab Results  Component Value Date   CREATININE 0.76 03/06/2016   CREATININE 0.89 06/11/2010   Sinus CT: 10/09/10 - continued diminished left maxillary sinus disease with both acute and chronic element. 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   HRCT Scan 11/04/2013 - pulmonary parenchymal pattern of fibrosis appears minimally progressive from 2012 study favoring usual interstitial pneumonitis.  HRCT Scan 05/10/17 - chronic bilateral interstitial lung disease with mild bronchiolectasis and honeycombing likely d/t usual interstitial pneumonia with interval progression of upper lobes. No pulmonary neoplasm or other acute findings.    PFTs 07/15/2014 - Reduced lung volums, increased FEV1/FVC ratio and diffusion defect indicate interstiatial process such as fibrosis or inflammation. Airway resistance is normal. No post-bronchodilator response.       ASSESSMENT & PLAN:  Problem List Items Addressed This Visit      Respiratory   INTERSTITIAL LUNG DISEASE    Interval progression of this noted on recent CT scan.         Other   Chronic cough    Seems this is multi-factoral. I have no evidence at this point that this is an infectious cause being she has no productive sputum to collect and no other constitutional symptoms such as fevers, chills or night sweats. I agree that a BAL/biopsy may be the most helpful tool at least from my perspective of infectious possibilities; although I am not very concerned for NTM/mTB considering symptoms and scans. Previous hypersensitivity testing that both Dr. [name] and Allergy team have done do not indicate hypersensitivity. Will check fungal ab on serum today, cbc w/ diff to assess eosinophilia. Also will Ig's with previously (+) p-ANCA antibodies as she could have immunologic/autoimmune component as ?Churg-Strauss Syndrome. Repeat ANA, p-ANCA ab as well being they were previously 76yrold.   She may benefit from ENT  evaluation and nasal/laryngeal endoscopy with drainage and foreign body sensation.   Although she initially reports trying recommendations from pulmonology and allergy specialists I am not certain this is the case. I do think that being her cough is worsened with eating/drinking and her sleep is spared that there is some component of LPRD and recommended that she reconsider trying the reflux prevention plan that was previously given to her. Her husband seems to agree that she should try these options.  Being she proclaims environmental allergies I also discussed the utility of montelukast in addition to daily antihistamines to help from that aspect.   I spent a lot of time with her and her husband discussing the previous recommendations from  her pulmonology team and feel very strongly that her chronic lung disease is also contributing. She has had interval changes to CT scan since last visit and I recommended her making an appointment with Dr. [name] to have him weigh in again. I do not believe she has a great understanding about her chronic lung condition and was surprised I told her that there was evidence of progression on her CT scan and frustrated she was not told this earlier.   She will return in 3 weeks to review lab work as I believe over the phone would be difficult. Again I feel strongly she should return to pulmonology team because even if this is not contributing much currently I anticipate it will at some point.       Environmental allergies    Recommend daily antihistamine, nasal steroid (counseled that this usually takes 1-2 weeks until improvement noted), montelukast as previously recommended.        Other Visit Diagnoses    Cough    -  Primary   Relevant Orders   Aspergillus Antigen, Serum   Fungal antibodies panel, id-blood   CBC with Differential/Platelet   Positive ANA (antinuclear antibody)       Relevant Orders   Antinuclear Antib (ANA)   ANCA Screen Reflex Titer  (Completed)   Rheumatoid factor (Completed)   IgE (Completed)   IgG, IgA, IgM (Completed)     I spent 60 minutes with the patient including greater than 50% of time in face to face counsel of the patient re chronic lung disease, pathogenesis of fibrosis, review of previous studies and office visits with her and her husband, benefits of going back to see pulmonology and potentially ENT and in coordination of their care.  Janene Madeira, MSN, [facility] [place] [facility]  06/01/2017  4:03 PM

## 2017-05-30 NOTE — Patient Instructions (Addendum)
Will check some blood work today  I would like to get you back in to see pulmonology as you have had some changes on your CT scans regarding chronic lung disease changes that I think you should be in their care. It may or may not be contributing overall to your cough.   Will have you back in 3 weeks to review plan or if needed will call you over the phone.

## 2017-06-01 NOTE — Assessment & Plan Note (Addendum)
Seems this is multi-factoral. I have no evidence at this point that this is an infectious cause being she has no productive sputum to collect and no other constitutional symptoms such as fevers, chills or night sweats. I agree that a BAL/biopsy may be the most helpful tool at least from my perspective of infectious possibilities; although I am not very concerned for NTM/mTB considering symptoms and scans. Previous hypersensitivity testing that both Dr. [name] and Allergy team have done do not indicate hypersensitivity. Will check fungal ab on serum today, cbc w/ diff to assess eosinophilia. Also will Ig's with previously (+) p-ANCA antibodies as she could have immunologic/autoimmune component as ?Churg-Strauss Syndrome. Repeat ANA, p-ANCA ab as well being they were previously 76yrold.   She may benefit from ENT evaluation and nasal/laryngeal endoscopy with drainage and foreign body sensation.   Although she initially reports trying recommendations from pulmonology and allergy specialists I am not certain this is the case. I do think that being her cough is worsened with eating/drinking and her sleep is spared that there is some component of LPRD and recommended that she reconsider trying the reflux prevention plan that was previously given to her. Her husband seems to agree that she should try these options.  Being she proclaims environmental allergies I also discussed the utility of montelukast in addition to daily antihistamines to help from that aspect.   I spent a lot of time with her and her husband discussing the previous recommendations from her pulmonology team and feel very strongly that her chronic lung disease is also contributing. She has had interval changes to CT scan since last visit and I recommended her making an appointment with Dr. RChase Callerto have him weigh in again. I do not believe she has a great understanding about her chronic lung condition and was surprised I told her that there  was evidence of progression on her CT scan and frustrated she was not told this earlier.   She will return in 3 weeks to review lab work as I believe over the phone would be difficult. Again I feel strongly she should return to pulmonology team because even if this is not contributing much currently I anticipate it will at some point.

## 2017-06-01 NOTE — Assessment & Plan Note (Signed)
Recommend daily antihistamine, nasal steroid (counseled that this usually takes 1-2 weeks until improvement noted), montelukast as previously recommended.

## 2017-06-01 NOTE — Assessment & Plan Note (Signed)
Interval progression of this noted on recent CT scan.

## 2017-06-02 LAB — ASPERGILLUS ANTIGEN,SERUM
ASPERGILLUS AG, EIA: NOT DETECTED
INDEX VAL: 0.3 (ref <0.50)

## 2017-06-03 LAB — FUNGAL ANTIBODIES PANEL, ID-BLOOD
ASPERGILLUS FLAVUS AB: NEGATIVE
Aspergillus Niger Antibodies: NEGATIVE
Aspergillus fumigatus: NEGATIVE
BLASTOMYCES ABS, QN, DID: NEGATIVE
Coccidioides Antibody ID: NEGATIVE
Histoplasma Ab, Immunodiffusion: NEGATIVE

## 2017-06-03 LAB — IGE: IgE (Immunoglobulin E), Serum: 13 kU/L (ref <=114)

## 2017-06-03 LAB — ANCA SCREEN W REFLEX TITER

## 2017-06-03 LAB — ANA: ANA: POSITIVE — AB

## 2017-06-03 LAB — IGG, IGA, IGM
IGG (IMMUNOGLOBIN G), SERUM: 1787 mg/dL — AB (ref 694–1618)
IgM, Serum: 193 mg/dL (ref 48–271)
Immunoglobulin A: 447 mg/dL (ref 81–463)

## 2017-06-03 LAB — RHEUMATOID FACTOR: Rhuematoid fact SerPl-aCnc: 16 IU/mL — ABNORMAL HIGH (ref <14)

## 2017-06-03 LAB — ANTI-NUCLEAR AB-TITER (ANA TITER): ANA Titer 1: 1:320 {titer} — ABNORMAL HIGH

## 2017-06-20 ENCOUNTER — Encounter: Payer: Self-pay | Admitting: Infectious Diseases

## 2017-06-20 ENCOUNTER — Ambulatory Visit (INDEPENDENT_AMBULATORY_CARE_PROVIDER_SITE_OTHER): Payer: Medicare Other | Admitting: Infectious Diseases

## 2017-06-20 VITALS — BP 119/68 | HR 84 | Temp 97.9°F | Wt 107.0 lb

## 2017-06-20 DIAGNOSIS — R05 Cough: Secondary | ICD-10-CM

## 2017-06-20 DIAGNOSIS — R053 Chronic cough: Secondary | ICD-10-CM

## 2017-06-20 DIAGNOSIS — R768 Other specified abnormal immunological findings in serum: Secondary | ICD-10-CM | POA: Diagnosis not present

## 2017-06-20 NOTE — Patient Instructions (Signed)
Happy to see you back if there are any changes that we can help you with.   Placed referrals back to pulmonology and rheumatology.

## 2017-06-20 NOTE — Progress Notes (Signed)
[name] [birth year] [id number] PCP: [name], MD  Referring Provider:  Genelle Bal, NP [place])  Reason for Visit: Chronic Interstitial Lung Disease, cough    HPI: [name] is a 77 y.o. female with  has a past medical history of Bronchiectasis, Chronic diarrhea, Chronic rhinitis, Diabetes mellitus ([place]), Hyperplastic colon polyp, IBS (irritable bowel syndrome), ILD (interstitial lung disease) ([place]), and Stenosis of rectum and anus. Previous heavy 3ppd smoker until the age of ~ 89.   [name] and her husband are here today for follow up on her labs and discussion of further plan for work up of her chronic cough. She has felt a little better with more energy since our last visit. Cough is ongoing but not as frequent severe symptoms. She has not retrialed any of the reflux medication. She is hoping I can do something to treat her today as she is having urinary incontinence with her very severe episodes.   She does have problems with some infrequent non-limiting joint pain and stiffness that migrates to different joints. Denies dry eyes or dry mouth.   Patient Active Problem List   Diagnosis Date Noted  . Environmental allergies 02/07/2015  . IPF (idiopathic pulmonary fibrosis) ([place]) 07/15/2014  . Chronic cough 02/11/2014  . ILD (interstitial lung disease) ([place]) 11/18/2013  . Chronic rhinitis 10/02/2010  . GERD (gastroesophageal reflux disease) 10/02/2010  . INTERSTITIAL LUNG DISEASE 07/10/2010  . DEPRESSION/ANXIETY 04/12/2008  . IRRITABLE BOWEL SYNDROME 10/09/2007  . STENOSIS OF RECTUM AND ANUS 10/09/2007  . DIARRHEA, CHRONIC 10/09/2007   Review of Systems  Constitutional: Positive for malaise/fatigue. Negative for chills, diaphoresis, fever and weight loss.  HENT: Negative for congestion, hearing loss and tinnitus.   Eyes: Negative for blurred vision and photophobia.  Respiratory: Positive for cough. Negative for hemoptysis, sputum production, shortness of breath  and wheezing.   Cardiovascular: Negative for chest pain, orthopnea and leg swelling.  Gastrointestinal: Negative for abdominal pain, diarrhea and vomiting.  Genitourinary: Negative for dysuria.  Musculoskeletal: Negative for myalgias.  Skin: Negative for rash.  Neurological: Negative for dizziness and headaches.  Endo/Heme/Allergies: Positive for environmental allergies.    Past Medical History:  Diagnosis Date  . Bronchiectasis    PFTs August 23, 2010 VC 82%   dlco 62 > 129 CORRECTED  . Chronic diarrhea   . Chronic rhinitis    sinus CT ordered August 23, 2010  . Diabetes mellitus ([place])   . Hyperplastic colon polyp   . IBS (irritable bowel syndrome)   . ILD (interstitial lung disease) ([place])   . Stenosis of rectum and anus     Social History   Tobacco Use  . Smoking status: Former Smoker    Packs/day: 3.00    Years: 15.00    Pack years: 45.00    Types: Cigarettes    Last attempt to quit: 06/11/1976    Years since quitting: 41.0  . Smokeless tobacco: Never Used  Substance Use Topics  . Alcohol use: No  . Drug use: No    Family History  Problem Relation Age of Onset  . Colon polyps Sister   . Diabetes Sister   . Heart disease Mother   . Colon cancer Neg Hx      Allergies  Allergen Reactions  . Penicillins Diarrhea    Has patient had a PCN reaction causing immediate rash, facial/tongue/throat swelling, SOB or lightheadedness with hypotension: Unknown Has patient had a PCN reaction causing severe rash involving mucus membranes or  skin necrosis: Unknown Has patient had a PCN reaction that required hospitalization: No  Has patient had a PCN reaction occurring within the last 10 years: No  If all of the above answers are "NO", then may proceed with Cephalosporin use.   . Clarithromycin     Unknown reaction   . Cortisone Other (See Comments)    "Made her feel like ice water was running through her veins"   . Povidone-Iodine     Unknown reaction      OBJECTIVE: Vitals:   06/20/17 1645  BP: 119/68  Pulse: 84  Temp: 97.9 F (36.6 C)  TempSrc: Oral  SpO2: 98%  Weight: 107 lb (48.5 kg)   Body mass index is 17.97 kg/m.  Physical Exam  Constitutional: She is oriented to person, place, and time and well-developed, well-nourished, and in no distress.  Accompanied by her husband. Appears well today.   HENT:  Right Ear: External ear normal.  Left Ear: External ear normal.  Mouth/Throat: Oropharynx is clear and moist.  Eyes: Pupils are equal, round, and reactive to light.  Neck: Normal range of motion.  Cardiovascular: Normal rate, regular rhythm and normal heart sounds.  Pulmonary/Chest: Effort normal. She has rales in the right middle field, the right lower field, the left middle field and the left lower field.  Abdominal: Soft. Bowel sounds are normal. She exhibits no distension.  Musculoskeletal: Normal range of motion.  Lymphadenopathy:    She has no cervical adenopathy.  Neurological: She is alert and oriented to person, place, and time.  Skin: Skin is warm and dry.  Psychiatric: Affect normal.  Vitals reviewed.  LABS & DIAGNOSTICS:   Sinus CT: 10/09/10 - continued diminished left maxillary sinus disease with both acute and chronic element. 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   HRCT Scan 11/04/2013 - pulmonary parenchymal pattern of fibrosis appears minimally progressive from 2012 study favoring usual interstitial pneumonitis.  HRCT Scan 05/10/17 - chronic bilateral interstitial lung disease with mild bronchiolectasis and honeycombing likely d/t usual interstitial pneumonia with interval progression of upper lobes. No pulmonary neoplasm or other acute findings.    PFTs 07/15/2014 - Reduced lung volums, increased FEV1/FVC ratio and diffusion defect indicate interstiatial process such as fibrosis or inflammation. Airway resistance is normal. No post-bronchodilator response.    ASSESSMENT & PLAN:  Problem List  Items Addressed This Visit      Other   Chronic cough    Reviewed her recent lab work that showed no antibodies to any fungal components including aspergillus. We discussed that although this is not an all-inclusive study and imperfect it is the best I have to go off of considering she has no bronchoscopic evidence of infections. Reinforced again role for pulmonology to re-evaluate her case considering she has had progressive changes noted on her CT scans. Alternatively could consider ENT evaluation as she is certain her problems are stemming from "up top." Will let pulmonology make that determination. I also feel like rheumatology would be of value being her work up is still positive and may be a contributing factor (churg-strauss syndrome?). She and her husband are agreeable to this plan. Referral placed to pulmonology.   If anything is found on her work up to be an infectious contributor she is welcome to return.       Relevant Orders   Ambulatory referral to Pulmonology    Other Visit Diagnoses    Elevated antinuclear antibody (ANA) level    -  Primary  Relevant Orders   Ambulatory referral to Rheumatology     I spent 15 minutes with the patient including greater than 50% of time in face to face counsel of the patient on the above findings.   Janene Madeira, MSN, [facility] for Infectious Disease [place] [facility]  06/21/2017  4:23 PM

## 2017-06-21 NOTE — Assessment & Plan Note (Addendum)
Reviewed her recent lab work that showed no antibodies to any fungal components including aspergillus. We discussed that although this is not an all-inclusive study and imperfect it is the best I have to go off of considering she has no bronchoscopic evidence of infections. Reinforced again role for pulmonology to re-evaluate her case considering she has had progressive changes noted on her CT scans. Alternatively could consider ENT evaluation as she is certain her problems are stemming from "up top." Will let pulmonology make that determination. I also feel like rheumatology would be of value being her work up is still positive and may be a contributing factor (churg-strauss syndrome?). She and her husband are agreeable to this plan. Referral placed to pulmonology.   If anything is found on her work up to be an infectious contributor she is welcome to return.

## 2018-05-13 ENCOUNTER — Inpatient Hospital Stay (HOSPITAL_COMMUNITY)
Admission: EM | Admit: 2018-05-13 | Discharge: 2018-05-14 | DRG: 196 | Disposition: A | Discharge status: Home Health | Payer: Medicare Other | Attending: Family Medicine | Admitting: Family Medicine

## 2018-05-13 ENCOUNTER — Emergency Department (HOSPITAL_COMMUNITY): Payer: Medicare Other

## 2018-05-13 ENCOUNTER — Other Ambulatory Visit: Payer: Self-pay

## 2018-05-13 ENCOUNTER — Encounter (HOSPITAL_COMMUNITY): Payer: Self-pay | Admitting: Emergency Medicine

## 2018-05-13 DIAGNOSIS — E119 Type 2 diabetes mellitus without complications: Secondary | ICD-10-CM | POA: Diagnosis present

## 2018-05-13 DIAGNOSIS — I700 Atherosclerosis of aorta: Secondary | ICD-10-CM | POA: Diagnosis present

## 2018-05-13 DIAGNOSIS — I2510 Atherosclerotic heart disease of native coronary artery without angina pectoris: Secondary | ICD-10-CM | POA: Diagnosis present

## 2018-05-13 DIAGNOSIS — F418 Other specified anxiety disorders: Secondary | ICD-10-CM | POA: Diagnosis present

## 2018-05-13 DIAGNOSIS — K529 Noninfective gastroenteritis and colitis, unspecified: Secondary | ICD-10-CM

## 2018-05-13 DIAGNOSIS — Z87891 Personal history of nicotine dependence: Secondary | ICD-10-CM

## 2018-05-13 DIAGNOSIS — K219 Gastro-esophageal reflux disease without esophagitis: Secondary | ICD-10-CM | POA: Diagnosis present

## 2018-05-13 DIAGNOSIS — E876 Hypokalemia: Secondary | ICD-10-CM | POA: Diagnosis present

## 2018-05-13 DIAGNOSIS — F341 Dysthymic disorder: Secondary | ICD-10-CM | POA: Diagnosis present

## 2018-05-13 DIAGNOSIS — J84112 Idiopathic pulmonary fibrosis: Principal | ICD-10-CM | POA: Diagnosis present

## 2018-05-13 DIAGNOSIS — Z833 Family history of diabetes mellitus: Secondary | ICD-10-CM

## 2018-05-13 DIAGNOSIS — Z9049 Acquired absence of other specified parts of digestive tract: Secondary | ICD-10-CM

## 2018-05-13 DIAGNOSIS — J9621 Acute and chronic respiratory failure with hypoxia: Secondary | ICD-10-CM | POA: Diagnosis present

## 2018-05-13 DIAGNOSIS — K589 Irritable bowel syndrome without diarrhea: Secondary | ICD-10-CM | POA: Diagnosis present

## 2018-05-13 DIAGNOSIS — Z880 Allergy status to penicillin: Secondary | ICD-10-CM | POA: Diagnosis not present

## 2018-05-13 DIAGNOSIS — R0902 Hypoxemia: Secondary | ICD-10-CM | POA: Diagnosis not present

## 2018-05-13 DIAGNOSIS — Z888 Allergy status to other drugs, medicaments and biological substances status: Secondary | ICD-10-CM | POA: Diagnosis not present

## 2018-05-13 DIAGNOSIS — Z79890 Hormone replacement therapy (postmenopausal): Secondary | ICD-10-CM | POA: Diagnosis not present

## 2018-05-13 DIAGNOSIS — E039 Hypothyroidism, unspecified: Secondary | ICD-10-CM | POA: Diagnosis present

## 2018-05-13 DIAGNOSIS — J310 Chronic rhinitis: Secondary | ICD-10-CM | POA: Diagnosis present

## 2018-05-13 DIAGNOSIS — Z881 Allergy status to other antibiotic agents status: Secondary | ICD-10-CM

## 2018-05-13 DIAGNOSIS — Z8249 Family history of ischemic heart disease and other diseases of the circulatory system: Secondary | ICD-10-CM | POA: Diagnosis not present

## 2018-05-13 DIAGNOSIS — Z8719 Personal history of other diseases of the digestive system: Secondary | ICD-10-CM

## 2018-05-13 DIAGNOSIS — Z8371 Family history of colonic polyps: Secondary | ICD-10-CM

## 2018-05-13 DIAGNOSIS — R197 Diarrhea, unspecified: Secondary | ICD-10-CM | POA: Diagnosis present

## 2018-05-13 DIAGNOSIS — J849 Interstitial pulmonary disease, unspecified: Secondary | ICD-10-CM

## 2018-05-13 LAB — POCT I-STAT, CHEM 8
BUN: 6 mg/dL — ABNORMAL LOW (ref 8–23)
Calcium, Ion: 1.1 mmol/L — ABNORMAL LOW (ref 1.15–1.40)
Chloride: 97 mmol/L — ABNORMAL LOW (ref 98–111)
Creatinine, Ser: 0.8 mg/dL (ref 0.44–1.00)
Glucose, Bld: 133 mg/dL — ABNORMAL HIGH (ref 70–99)
HCT: 33 % — ABNORMAL LOW (ref 36.0–46.0)
HEMOGLOBIN: 11.2 g/dL — AB (ref 12.0–15.0)
Potassium: 3 mmol/L — ABNORMAL LOW (ref 3.5–5.1)
Sodium: 137 mmol/L (ref 135–145)
TCO2: 29 mmol/L (ref 22–32)

## 2018-05-13 LAB — URINALYSIS, ROUTINE W REFLEX MICROSCOPIC
Bilirubin Urine: NEGATIVE
Glucose, UA: NEGATIVE mg/dL
Ketones, ur: NEGATIVE mg/dL
Nitrite: NEGATIVE
Protein, ur: 30 mg/dL — AB
Specific Gravity, Urine: 1.016 (ref 1.005–1.030)
pH: 6 (ref 5.0–8.0)

## 2018-05-13 LAB — COMPREHENSIVE METABOLIC PANEL
ALT: 16 U/L (ref 0–44)
AST: 27 U/L (ref 15–41)
Albumin: 2.9 g/dL — ABNORMAL LOW (ref 3.5–5.0)
Alkaline Phosphatase: 61 U/L (ref 38–126)
Anion gap: 11 (ref 5–15)
BUN: 8 mg/dL (ref 8–23)
CO2: 28 mmol/L (ref 22–32)
Calcium: 8.7 mg/dL — ABNORMAL LOW (ref 8.9–10.3)
Chloride: 98 mmol/L (ref 98–111)
Creatinine, Ser: 0.85 mg/dL (ref 0.44–1.00)
GFR calc Af Amer: >60 mL/min (ref >60)
GFR calc non Af Amer: >60 mL/min (ref >60)
GLUCOSE: 134 mg/dL — AB (ref 70–99)
Potassium: 3 mmol/L — ABNORMAL LOW (ref 3.5–5.1)
Sodium: 137 mmol/L (ref 135–145)
TOTAL PROTEIN: 7.7 g/dL (ref 6.5–8.1)
Total Bilirubin: 0.6 mg/dL (ref 0.3–1.2)

## 2018-05-13 LAB — CBC WITH DIFFERENTIAL/PLATELET
Abs Immature Granulocytes: 0.04 10*3/uL (ref 0.00–0.07)
Basophils Absolute: 0 10*3/uL (ref 0.0–0.1)
Basophils Relative: 0 %
Eosinophils Absolute: 0.1 10*3/uL (ref 0.0–0.5)
Eosinophils Relative: 2 %
HCT: 35 % — ABNORMAL LOW (ref 36.0–46.0)
Hemoglobin: 11.5 g/dL — ABNORMAL LOW (ref 12.0–15.0)
Immature Granulocytes: 1 %
Lymphocytes Relative: 24 %
Lymphs Abs: 1.8 10*3/uL (ref 0.7–4.0)
MCH: 30.7 pg (ref 26.0–34.0)
MCHC: 32.9 g/dL (ref 30.0–36.0)
MCV: 93.3 fL (ref 80.0–100.0)
Monocytes Absolute: 0.4 10*3/uL (ref 0.1–1.0)
Monocytes Relative: 6 %
Neutro Abs: 5 10*3/uL (ref 1.7–7.7)
Neutrophils Relative %: 67 %
PLATELETS: 340 10*3/uL (ref 150–400)
RBC: 3.75 MIL/uL — ABNORMAL LOW (ref 3.87–5.11)
RDW: 12.7 % (ref 11.5–15.5)
WBC: 7.4 10*3/uL (ref 4.0–10.5)
nRBC: 0 % (ref 0.0–0.2)

## 2018-05-13 LAB — POCT I-STAT TROPONIN I: Troponin i, poc: 0.01 ng/mL (ref 0.00–0.08)

## 2018-05-13 LAB — LIPASE, BLOOD: Lipase: 34 U/L (ref 11–51)

## 2018-05-13 LAB — GLUCOSE, CAPILLARY: Glucose-Capillary: 123 mg/dL — ABNORMAL HIGH (ref 70–99)

## 2018-05-13 MED ORDER — METHYLPREDNISOLONE SODIUM SUCC 125 MG IJ SOLR
125.0000 mg | Freq: Once | INTRAMUSCULAR | Status: AC
  Administered 2018-05-13: 125 mg via INTRAVENOUS
  Filled 2018-05-13: qty 2

## 2018-05-13 MED ORDER — ONDANSETRON HCL 4 MG/2ML IJ SOLN
4.0000 mg | Freq: Once | INTRAMUSCULAR | Status: DC
  Filled 2018-05-13: qty 2

## 2018-05-13 MED ORDER — POTASSIUM CHLORIDE CRYS ER 20 MEQ PO TBCR
40.0000 meq | EXTENDED_RELEASE_TABLET | Freq: Once | ORAL | Status: AC
  Administered 2018-05-13: 40 meq via ORAL
  Filled 2018-05-13: qty 2

## 2018-05-13 MED ORDER — SODIUM CHLORIDE 0.9 % IV BOLUS
1000.0000 mL | Freq: Once | INTRAVENOUS | Status: AC
  Administered 2018-05-13: 1000 mL via INTRAVENOUS

## 2018-05-13 NOTE — ED Notes (Signed)
Bed: WA25 Expected date:  Expected time:  Means of arrival:  Comments: Triage 1 

## 2018-05-13 NOTE — H&P (Signed)
History and Physical   [name] ZOX:[id number] DOB: Sep 15, 1940 DOA: 05/13/2018  Referring MD/NP/PA: Dr. Darl Householder  PCP: [name], MD   Outpatient Specialists: None  Patient coming from: Home  Chief Complaint: Shortness of breath and diarrhea  HPI: [name] is a 77 y.o. female with medical history significant of pulmonary fibrosis with bronchiectasis who has not seen pulmonologist in 3 years, irritable bowel syndrome with severe chronic diarrhea, hyperlipidemia, hypothyroidism, diabetes, who came to the ER with significant shortness of breath and hypoxemia.  Patient had a flareup of her IBS and has been having significant diarrhea for the last couple of days.  He has been so severe that she became completely weak.  She believes she became more walked up..  She started having significant shortness of breath today.  Tried her inhalers but not helping.  When she came to the ER her oxygen sats was in the 80s.  She is struggling to breathe.  Patient is placed on oxygen at the moment and is being admitted with acute on chronic respiratory failure with hypoxemia..  ED Course: Temperature is 98.5 blood pressure 127/94 pulse 87 respiratory rate 23 oxygen sat 88% on room air.  Her white count is 7.4 IMA globin 11.2 and platelets 340.  Sodium is 137 potassium 3.0 chloride 97 CO2 28 with BUN of 6 creatinine 0.8 calcium 8.7 glucose 133.  Chest x-ray showed no acute findings but severe chronic lung disease.  Urinalysis showed trace leukocytes.  Patient received nebulizer treatment with 1 dose of IV Solu-Medrol and is being admitted to the hospital for hypoxemia.  Review of Systems: As per HPI otherwise 10 point review of systems negative.    Past Medical History:  Diagnosis Date  . Bronchiectasis    PFTs August 23, 2010 VC 82%   dlco 62 > 129 CORRECTED  . Chronic diarrhea   . Chronic rhinitis    sinus CT ordered August 23, 2010  . Diabetes mellitus ([place])   . Hyperplastic colon polyp   . IBS  (irritable bowel syndrome)   . ILD (interstitial lung disease) ([place])   . Stenosis of rectum and anus     Past Surgical History:  Procedure Laterality Date  . CHOLECYSTECTOMY    . DILATION AND CURETTAGE OF UTERUS    . HAND SURGERY  1991   left hand; Baker's Cyst removed  . migraine    . tear duct surgery       reports that she quit smoking about 41 years ago. Her smoking use included cigarettes. She has a 45.00 pack-year smoking history. She has never used smokeless tobacco. She reports that she does not drink alcohol or use drugs.  Allergies  Allergen Reactions  . Penicillins Diarrhea    Has patient had a PCN reaction causing immediate rash, facial/tongue/throat swelling, SOB or lightheadedness with hypotension: Unknown Has patient had a PCN reaction causing severe rash involving mucus membranes or skin necrosis: Unknown Has patient had a PCN reaction that required hospitalization: No  Has patient had a PCN reaction occurring within the last 10 years: No  If all of the above answers are "NO", then may proceed with Cephalosporin use.   . Clarithromycin     Unknown reaction   . Cortisone Other (See Comments)    "Made her feel like ice water was running through her veins"   . Povidone-Iodine     Unknown reaction     Family History  Problem Relation Age of Onset  .  Colon polyps Sister   . Diabetes Sister   . Heart disease Mother   . Colon cancer Neg Hx      Prior to Admission medications   Medication Sig Start Date End Date Taking? Authorizing Provider  acetaminophen (TYLENOL) 500 MG tablet Take 1,000 mg by mouth at bedtime.    [provider]  Ascorbic Acid (VITAMIN C) 500 MG tablet Take 500 mg by mouth at bedtime.     [provider]  BIOTIN PO Take 1 tablet by mouth daily.    [provider]  Calcium Carbonate-Vitamin D (CALCIUM 600-D) 600-400 MG-UNIT per tablet Take 1 tablet by mouth at bedtime.     [provider]  clorazepate  (TRANXENE) 3.75 MG tablet TAKE 1 TABLET BY MOUTH DAILY.MAY TAKE UP TO 2 DAILY IF NEEDED Patient taking differently: Take 3.75 mg by mouth daily.  05/10/15   Mauri Pole, MD  Cyanocobalamin (VITAMIN B-12 PO) Take 1 tablet by mouth daily.    [provider]  cyclobenzaprine (FLEXERIL) 10 MG tablet Take 10 mg by mouth at bedtime.     [provider]  dexlansoprazole (DEXILANT) 60 MG capsule Take one capsule every morning before breakfast Patient not taking: Reported on 06/20/2017 10/23/16   Kozlow, Donnamarie Poag, MD  glucosamine-chondroitin 500-400 MG tablet Take 1 tablet by mouth 2 (two) times daily. Triple strength    [provider]  levothyroxine (SYNTHROID, LEVOTHROID) 88 MCG tablet Take 88 mcg by mouth daily.      [provider]  montelukast (SINGULAIR) 10 MG tablet Take one tablet once daily as directed Patient not taking: Reported on 06/20/2017 10/23/16   Kozlow, Donnamarie Poag, MD  Multiple Vitamin (MULTIVITAMIN) tablet Take 1 tablet by mouth daily.      [provider]  pioglitazone (ACTOS) 15 MG tablet Take 15 mg by mouth daily.    [provider]  pravastatin (PRAVACHOL) 20 MG tablet Take 20 mg by mouth every evening. 08/13/16   [provider]  ranitidine (ZANTAC) 300 MG tablet Take one tablet every evening as directed Patient not taking: Reported on 05/30/2017 10/23/16   Kozlow, Donnamarie Poag, MD  verapamil (CALAN) 120 MG tablet Take 60 mg by mouth 2 (two) times daily.     [provider]    Physical Exam: Vitals:   05/13/18 1945 05/13/18 2019 05/13/18 2030 05/13/18 2149  BP:  127/75 (!) 127/94 100/74  Pulse:  83 82 82  Resp:  (!) 22 (!) 22 (!) 23  Temp:      TempSrc:      SpO2: 99% 95% 98% 98%  Weight:      Height:          Constitutional: NAD, calm, comfortable Vitals:   05/13/18 1945 05/13/18 2019 05/13/18 2030 05/13/18 2149  BP:  127/75 (!) 127/94 100/74  Pulse:  83 82 82  Resp:  (!) 22 (!) 22 (!) 23  Temp:        TempSrc:      SpO2: 99% 95% 98% 98%  Weight:      Height:       Eyes: PERRL, lids and conjunctivae normal ENMT: Mucous membranes are moist. Posterior pharynx clear of any exudate or lesions.Normal dentition.  Neck: normal, supple, no masses, no thyromegaly Respiratory: Decreased air entry bilaterally with marked crackles and coarse breath sounds as well as increased respiratory effort. No accessory muscle use.  Cardiovascular: Regular rate and rhythm, no murmurs / rubs / gallops.  No extremity edema. 2+ pedal pulses. No carotid bruits.  Abdomen: no tenderness, no masses palpated. No hepatosplenomegaly. Bowel sounds positive.  Musculoskeletal: no clubbing / cyanosis. No joint deformity upper and lower extremities. Good ROM, no contractures. Normal muscle tone.  Skin: no rashes, lesions, ulcers. No induration Neurologic: CN 2-12 grossly intact. Sensation intact, DTR normal. Strength 5/5 in all 4.  Psychiatric: Normal judgment and insight. Alert and oriented x 3. Normal mood.     Labs on Admission: I have personally reviewed following labs and imaging studies  CBC: Recent Labs  Lab 05/13/18 2053 05/13/18 2117  WBC 7.4  --   NEUTROABS 5.0  --   HGB 11.5* 11.2*  HCT 35.0* 33.0*  MCV 93.3  --   PLT 340  --    Basic Metabolic Panel: Recent Labs  Lab 05/13/18 2053 05/13/18 2117  NA 137 137  K 3.0* 3.0*  CL 98 97*  CO2 28  --   GLUCOSE 134* 133*  BUN 8 6*  CREATININE 0.85 0.80  CALCIUM 8.7*  --    GFR: Estimated Creatinine Clearance: 63.5 mL/min (by C-G formula based on SCr of 0.8 mg/dL). Liver Function Tests: Recent Labs  Lab 05/13/18 2053  AST 27  ALT 16  ALKPHOS 61  BILITOT 0.6  PROT 7.7  ALBUMIN 2.9*   Recent Labs  Lab 05/13/18 2053  LIPASE 34   No results for input(s): AMMONIA in the last 168 hours. Coagulation Profile: No results for input(s): INR, PROTIME in the last 168 hours. Cardiac Enzymes: No results for input(s): CKTOTAL, CKMB, CKMBINDEX,  TROPONINI in the last 168 hours. BNP (last 3 results) No results for input(s): PROBNP in the last 8760 hours. HbA1C: No results for input(s): HGBA1C in the last 72 hours. CBG: Recent Labs  Lab 05/13/18 2015  GLUCAP 123*   Lipid Profile: No results for input(s): CHOL, HDL, LDLCALC, TRIG, CHOLHDL, LDLDIRECT in the last 72 hours. Thyroid Function Tests: No results for input(s): TSH, T4TOTAL, FREET4, T3FREE, THYROIDAB in the last 72 hours. Anemia Panel: No results for input(s): VITAMINB12, FOLATE, FERRITIN, TIBC, IRON, RETICCTPCT in the last 72 hours. Urine analysis:    Component Value Date/Time   COLORURINE YELLOW 05/13/2018 2053   APPEARANCEUR CLEAR 05/13/2018 2053   LABSPEC 1.016 05/13/2018 2053   PHURINE 6.0 05/13/2018 2053   GLUCOSEU NEGATIVE 05/13/2018 2053   HGBUR SMALL (A) 05/13/2018 2053   BILIRUBINUR NEGATIVE 05/13/2018 2053   [place] NEGATIVE 05/13/2018 2053   PROTEINUR 30 (A) 05/13/2018 2053   NITRITE NEGATIVE 05/13/2018 2053   LEUKOCYTESUR TRACE (A) 05/13/2018 2053   Sepsis Labs: @LABRCNTIP (procalcitonin:4,lacticidven:4) )No results found for this or any previous visit (from the past 240 hour(s)).   Radiological Exams on Admission: Dg Chest 2 View  Result Date: 05/13/2018 CLINICAL DATA:  Shortness of breath on exertion. EXAM: CHEST - 2 VIEW COMPARISON:  04/16/2017 FINDINGS: Diffuse opacities throughout the lungs, likely related to the patient's severe chronic interstitial lung disease/fibrosis changes as seen on prior CT. No definite acute process. Heart is normal size. No effusions or acute bony abnormality. IMPRESSION: Severe chronic lung disease.  No definite acute process. Electronically Signed   By: Rolm Baptise M.D.   On: 05/13/2018 20:14    EKG: Independently reviewed.  It shows normal sinus rhythm with a rate of 85 nonspecific ST changes  Assessment/Plan Principal Problem:   Hypoxemia Active Problems:   DEPRESSION/ANXIETY   Irritable bowel  syndrome   DIARRHEA, CHRONIC   GERD (gastroesophageal reflux  disease)   IPF (idiopathic pulmonary fibrosis) (HCC)   Hypokalemia   Diabetes mellitus without complication ([place])     #1 acute hypoxemia: Secondary to idiopathic pulmonary fibrosis.  It appears there is worsening but no pneumonia.  Patient is requiring oxygen at the moment appears hypoxemic.  We will place patient on oxygen, initiate IV steroids, admit the patient for observation and may require home oxygen.  #2 hypokalemia: Secondary to chronic diarrhea.  Replete potassium and monitor closely.  #3 chronic diarrhea: Secondary to severe IBS.  Patient on multiple medications which we will continue with.  Also add supportive care in the hospital.  #4 irritable bowel syndrome: severe enough for patient to be followed by GI.  Continue aspirin No. 3  #5 depression with anxiety: We will continue with home regimen.  #6 hypothyroidism: Again continue home regimen.  #7 diabetes: Sliding scale insulin will be initiated.  Monitor closely.   DVT prophylaxis: Heparin Code Status: Full Family Communication: Husband and daughter at bedside Disposition Plan: Home Consults called: None but pulmonary should be called in the morning Admission status: Inpatient  Severity of Illness: The appropriate patient status for this patient is INPATIENT. Inpatient status is judged to be reasonable and necessary in order to provide the required intensity of service to ensure the patient's safety. The patient's presenting symptoms, physical exam findings, and initial radiographic and laboratory data in the context of their chronic comorbidities is felt to place them at high risk for further clinical deterioration. Furthermore, it is not anticipated that the patient will be medically stable for discharge from the hospital within 2 midnights of admission. The following factors support the patient status of inpatient.   " The patient's presenting symptoms  include shortness of breath and cough. " The worrisome physical exam findings include bilateral coarse breath sounds and crackles. " The initial radiographic and laboratory data are worrisome because of chest x-ray showed chronic lung disease. " The chronic co-morbidities include interstitial fibrosis and IBS.   * I certify that at the point of admission it is my clinical judgment that the patient will require inpatient hospital care spanning beyond 2 midnights from the point of admission due to high intensity of service, high risk for further deterioration and high frequency of surveillance required.Barbette Merino MD [facility] Pager [phone]  If 7PM-7AM, please contact night-coverage [email or web address] Password [facility]  05/13/2018, 10:37 PM

## 2018-05-13 NOTE — Discharge Instructions (Signed)
Admitted to hospital

## 2018-05-13 NOTE — ED Triage Notes (Signed)
Patient c/o SOB on exertion worsening x1 week and weakness after having N/V/D x10 days ago.

## 2018-05-13 NOTE — ED Provider Notes (Signed)
[facility] DEPT Provider Note   CSN: [id number] Arrival date & time: 05/13/18  6967  History   Chief Complaint Chief Complaint  Patient presents with  . Shortness of Breath    HPI [name] is a 77 y.o. female.  This is a 77 year old female with history chronic diarrhea, IBS, interstitial lung disease, bronchiectasis. She has been having episodes of diarrhea about 10 days ago as well as some vomiting, she reports that she had large episode of diarrhea about 1 week ago and has been having stable bowel movements since then.  She also reports that she has been having worsening shortness of breath with exertion for the past 10 days, she does not get short of breath when she is sitting after mild exertion.  She has never been on oxygen before, she reports that she had been given inhalers in the past but did not use any of that because of their side effects.  She had contacted her doctor and was asked to come in because they were worried about her being dehydrated as the cause of her shortness of breath.  Also reports that she has been having a decreased appetite has just not been hungry recently, she is also been having some subjective fevers and sweating.  She has a history of a chronic cough and she reports that she has been coughing up clear phlegm, this has not changed.  She reports that her husband has also been having symptoms of diarrhea and vomiting however he has improved.  Reports that she does not always take her medication because she forgets to take them.     Past Medical History:  Diagnosis Date  . Bronchiectasis    PFTs August 23, 2010 VC 82%   dlco 62 > 129 CORRECTED  . Chronic diarrhea   . Chronic rhinitis    sinus CT ordered August 23, 2010  . Diabetes mellitus ([place])   . Hyperplastic colon polyp   . IBS (irritable bowel syndrome)   . ILD (interstitial lung disease) ([place])   . Stenosis of rectum and anus     Patient Active Problem List     Diagnosis Date Noted  . Hypokalemia 05/13/2018  . Hypoxemia 05/13/2018  . Diabetes mellitus without complication ([place]) 89/38/1017  . Environmental allergies 02/07/2015  . IPF (idiopathic pulmonary fibrosis) ([place]) 07/15/2014  . Chronic cough 02/11/2014  . ILD (interstitial lung disease) ([place]) 11/18/2013  . Chronic rhinitis 10/02/2010  . GERD (gastroesophageal reflux disease) 10/02/2010  . INTERSTITIAL LUNG DISEASE 07/10/2010  . DEPRESSION/ANXIETY 04/12/2008  . Irritable bowel syndrome 10/09/2007  . STENOSIS OF RECTUM AND ANUS 10/09/2007  . DIARRHEA, CHRONIC 10/09/2007    Past Surgical History:  Procedure Laterality Date  . CHOLECYSTECTOMY    . DILATION AND CURETTAGE OF UTERUS    . HAND SURGERY  1991   left hand; Baker's Cyst removed  . migraine    . tear duct surgery       OB History   None      Home Medications    Prior to Admission medications   Medication Sig Start Date End Date Taking? Authorizing Provider  acetaminophen (TYLENOL) 500 MG tablet Take 1,000 mg by mouth at bedtime.    [provider]  Ascorbic Acid (VITAMIN C) 500 MG tablet Take 500 mg by mouth at bedtime.     [provider]  BIOTIN PO Take 1 tablet by mouth daily.    [provider]  Calcium Carbonate-Vitamin  D (CALCIUM 600-D) 600-400 MG-UNIT per tablet Take 1 tablet by mouth at bedtime.     [provider]  clorazepate (TRANXENE) 3.75 MG tablet TAKE 1 TABLET BY MOUTH DAILY.MAY TAKE UP TO 2 DAILY IF NEEDED Patient taking differently: Take 3.75 mg by mouth daily.  05/10/15   Mauri Pole, MD  Cyanocobalamin (VITAMIN B-12 PO) Take 1 tablet by mouth daily.    [provider]  cyclobenzaprine (FLEXERIL) 10 MG tablet Take 10 mg by mouth at bedtime.     [provider]  dexlansoprazole (DEXILANT) 60 MG capsule Take one capsule every morning before breakfast Patient not taking: Reported on 06/20/2017 10/23/16   Kozlow, Donnamarie Poag, MD   glucosamine-chondroitin 500-400 MG tablet Take 1 tablet by mouth 2 (two) times daily. Triple strength    [provider]  levothyroxine (SYNTHROID, LEVOTHROID) 88 MCG tablet Take 88 mcg by mouth daily.      [provider]  montelukast (SINGULAIR) 10 MG tablet Take one tablet once daily as directed Patient not taking: Reported on 06/20/2017 10/23/16   Kozlow, Donnamarie Poag, MD  Multiple Vitamin (MULTIVITAMIN) tablet Take 1 tablet by mouth daily.      [provider]  pioglitazone (ACTOS) 15 MG tablet Take 15 mg by mouth daily.    [provider]  pravastatin (PRAVACHOL) 20 MG tablet Take 20 mg by mouth every evening. 08/13/16   [provider]  ranitidine (ZANTAC) 300 MG tablet Take one tablet every evening as directed Patient not taking: Reported on 05/30/2017 10/23/16   Kozlow, Donnamarie Poag, MD  verapamil (CALAN) 120 MG tablet Take 60 mg by mouth 2 (two) times daily.     [provider]    Family History Family History  Problem Relation Age of Onset  . Colon polyps Sister   . Diabetes Sister   . Heart disease Mother   . Colon cancer Neg Hx     Social History Social History   Tobacco Use  . Smoking status: Former Smoker    Packs/day: 3.00    Years: 15.00    Pack years: 45.00    Types: Cigarettes    Last attempt to quit: 06/11/1976    Years since quitting: 41.9  . Smokeless tobacco: Never Used  Substance Use Topics  . Alcohol use: No  . Drug use: No     Allergies   Penicillins; Clarithromycin; Cortisone; and Povidone-iodine   Review of Systems Review of Systems  Constitutional: Positive for fever (subjective). Negative for chills, diaphoresis and fatigue.  Respiratory: Positive for cough (chronic, produtive with clear sputum) and shortness of breath (with exertion). Negative for chest tightness.   Cardiovascular: Negative for chest pain and palpitations.  Gastrointestinal: Positive for diarrhea, nausea and vomiting. Negative for  abdominal distention, abdominal pain and constipation.  Neurological: Negative for light-headedness, numbness and headaches.  All other systems reviewed and are negative.    Physical Exam Updated Vital Signs BP 92/64 (BP Location: Left Arm)   Pulse 82   Temp 98.5 F (36.9 C) (Oral)   Resp 20   Ht 5' 5.5" (1.664 m)   Wt 83.5 kg   SpO2 99%   BMI 30.15 kg/m   Physical Exam  Constitutional: She appears well-developed and well-nourished. She does not appear ill. No distress.  HENT:  Head: Normocephalic and atraumatic.  Mouth/Throat: Oropharynx is clear and moist. No oropharyngeal exudate.  Eyes: Pupils are equal, round, and reactive to light. EOM are normal.  Neck: Normal  range of motion. Neck supple. No thyromegaly present.  Cardiovascular: Normal rate, regular rhythm and normal heart sounds.  Pulmonary/Chest: Effort normal. No tachypnea. She exhibits no tenderness.  Diffuse dry crackles throughout lungs  Abdominal: Soft. Bowel sounds are normal. She exhibits no distension. There is no tenderness.  Musculoskeletal:       Right lower leg: She exhibits no edema.       Left lower leg: She exhibits no edema.  Skin: Skin is warm and dry. Capillary refill takes less than 2 seconds.  Psychiatric: She has a normal mood and affect. Her behavior is normal.     ED Treatments / Results  Labs (all labs ordered are listed, but only abnormal results are displayed) Labs Reviewed  URINALYSIS, ROUTINE W REFLEX MICROSCOPIC - Abnormal; Notable for the following components:      Result Value   Hgb urine dipstick SMALL (*)    Protein, ur 30 (*)    Leukocytes, UA TRACE (*)    Bacteria, UA RARE (*)    All other components within normal limits  GLUCOSE, CAPILLARY - Abnormal; Notable for the following components:   Glucose-Capillary 123 (*)    All other components within normal limits  CBC WITH DIFFERENTIAL/PLATELET - Abnormal; Notable for the following components:   RBC 3.75 (*)     Hemoglobin 11.5 (*)    HCT 35.0 (*)    All other components within normal limits  COMPREHENSIVE METABOLIC PANEL - Abnormal; Notable for the following components:   Potassium 3.0 (*)    Glucose, Bld 134 (*)    Calcium 8.7 (*)    Albumin 2.9 (*)    All other components within normal limits  POCT I-STAT, CHEM 8 - Abnormal; Notable for the following components:   Potassium 3.0 (*)    Chloride 97 (*)    BUN 6 (*)    Glucose, Bld 133 (*)    Calcium, Ion 1.10 (*)    Hemoglobin 11.2 (*)    HCT 33.0 (*)    All other components within normal limits  LIPASE, BLOOD  CBG MONITORING, ED  POCT I-STAT TROPONIN I  I-STAT TROPONIN, ED  I-STAT CHEM 8, ED    EKG EKG Interpretation  Date/Time:  Tuesday May 13 2018 19:50:24 EST Ventricular Rate:  85 PR Interval:    QRS Duration: 90 QT Interval:  373 QTC Calculation: 444 R Axis:   -48 Text Interpretation:  Sinus rhythm Left anterior fascicular block Low voltage, precordial leads Consider anterior infarct Nonspecific T abnormalities, lateral leads No significant change since last tracing Confirmed by Wandra Arthurs [phone]) on 05/13/2018 8:47:59 PM   Radiology Dg Chest 2 View  Result Date: 05/13/2018 CLINICAL DATA:  Shortness of breath on exertion. EXAM: CHEST - 2 VIEW COMPARISON:  04/16/2017 FINDINGS: Diffuse opacities throughout the lungs, likely related to the patient's severe chronic interstitial lung disease/fibrosis changes as seen on prior CT. No definite acute process. Heart is normal size. No effusions or acute bony abnormality. IMPRESSION: Severe chronic lung disease.  No definite acute process. Electronically Signed   By: Rolm Baptise M.D.   On: 05/13/2018 20:14    Procedures Procedures (including critical care time)  Medications Ordered in ED Medications  sodium chloride 0.9 % bolus 1,000 mL (1,000 mLs Intravenous New Bag/Given 05/13/18 2242)  ondansetron (ZOFRAN) injection 4 mg (4 mg Intravenous Refused 05/13/18 2243)    methylPREDNISolone sodium succinate (SOLU-MEDROL) 125 mg/2 mL injection 125 mg (125 mg Intravenous Given 05/13/18 2243)  potassium chloride SA (K-DUR,KLOR-CON) CR tablet 40 mEq (40 mEq Oral Given 05/13/18 2243)     Initial Impression / Assessment and Plan / ED Course  I have reviewed the triage vital signs and the nursing notes.  Pertinent labs & imaging results that were available during my care of the patient were reviewed by me and considered in my medical decision making (see chart for details).     This is a 77 year old female with history of interstitial lung disease and chronic diarrhea presenting with worsening shortness of breath on exertion of diarrhea and vomiting and to which has improved 7 days ago.  She reported a subjective fever, nausea, and decreased appetite. She was found to be hypoxic to 88% when she arrived with a RR of 18, she was placed on 2L [place] and this improved to 99%. She has never required oxygen before. On exam she does have some diffuse dry crackles throughout lung fields. Chest x-ray showed stable lung disease with no acute findings.  This may be due to worsening of her interstitial lung disease possibly secondary to a gastroenteritis.  She reports that her chronic diarrhea and she takes her medications however she has not been taking them due to feeling unwell. She has had this diagnosis of interstitial lung disease for a while and it has been stable however given the new oxygen requirements and worsening shortness of breath with exertion it's possible that this is now progressing. Will start her on steroids and admit for further evaluation and treatment of her interstitial lung disease.   Final Clinical Impressions(s) / ED Diagnoses   Final diagnoses:  Interstitial lung disease [facility])  Gastroenteritis    ED Discharge Orders    None       Asencion Noble, MD 05/13/18 2311    Drenda Freeze, MD 05/13/18 [phone]

## 2018-05-14 ENCOUNTER — Inpatient Hospital Stay (HOSPITAL_COMMUNITY): Payer: Medicare Other

## 2018-05-14 ENCOUNTER — Other Ambulatory Visit: Payer: Self-pay

## 2018-05-14 DIAGNOSIS — J84112 Idiopathic pulmonary fibrosis: Principal | ICD-10-CM

## 2018-05-14 LAB — CBC
HCT: 32.4 % — ABNORMAL LOW (ref 36.0–46.0)
Hemoglobin: 10.6 g/dL — ABNORMAL LOW (ref 12.0–15.0)
MCH: 30.5 pg (ref 26.0–34.0)
MCHC: 32.7 g/dL (ref 30.0–36.0)
MCV: 93.1 fL (ref 80.0–100.0)
Platelets: 322 10*3/uL (ref 150–400)
RBC: 3.48 MIL/uL — ABNORMAL LOW (ref 3.87–5.11)
RDW: 12.8 % (ref 11.5–15.5)
WBC: 5.3 10*3/uL (ref 4.0–10.5)
nRBC: 0 % (ref 0.0–0.2)

## 2018-05-14 LAB — COMPREHENSIVE METABOLIC PANEL
ALT: 16 U/L (ref 0–44)
AST: 25 U/L (ref 15–41)
Albumin: 2.7 g/dL — ABNORMAL LOW (ref 3.5–5.0)
Alkaline Phosphatase: 61 U/L (ref 38–126)
Anion gap: 10 (ref 5–15)
BUN: 10 mg/dL (ref 8–23)
CO2: 23 mmol/L (ref 22–32)
Calcium: 8 mg/dL — ABNORMAL LOW (ref 8.9–10.3)
Chloride: 104 mmol/L (ref 98–111)
Creatinine, Ser: 0.77 mg/dL (ref 0.44–1.00)
GFR calc Af Amer: >60 mL/min (ref >60)
GFR calc non Af Amer: >60 mL/min (ref >60)
Glucose, Bld: 278 mg/dL — ABNORMAL HIGH (ref 70–99)
Potassium: 3.7 mmol/L (ref 3.5–5.1)
Sodium: 137 mmol/L (ref 135–145)
Total Bilirubin: 0.6 mg/dL (ref 0.3–1.2)
Total Protein: 7.3 g/dL (ref 6.5–8.1)

## 2018-05-14 LAB — RESPIRATORY PANEL BY PCR

## 2018-05-14 LAB — GLUCOSE, CAPILLARY
Glucose-Capillary: 261 mg/dL — ABNORMAL HIGH (ref 70–99)
Glucose-Capillary: 180 mg/dL — ABNORMAL HIGH (ref 70–99)

## 2018-05-14 LAB — BRAIN NATRIURETIC PEPTIDE: B Natriuretic Peptide: 82.1 pg/mL (ref 0.0–100.0)

## 2018-05-14 LAB — CBG MONITORING, ED: Glucose-Capillary: 223 mg/dL — ABNORMAL HIGH (ref 70–99)

## 2018-05-14 MED ORDER — ALBUTEROL SULFATE HFA 108 (90 BASE) MCG/ACT IN AERS: 2.0000 | INHALATION_SPRAY | Freq: Four times a day (QID) | RESPIRATORY_TRACT | 2 refills | Status: DC | PRN

## 2018-05-14 MED ORDER — ACETAMINOPHEN 325 MG PO TABS: 650.0000 mg | ORAL_TABLET | Freq: Four times a day (QID) | ORAL | Status: DC | PRN

## 2018-05-14 MED ORDER — CLORAZEPATE DIPOTASSIUM 7.5 MG PO TABS
3.7500 mg | ORAL_TABLET | Freq: Every day | ORAL | Status: DC
  Administered 2018-05-14: 3.75 mg via ORAL
  Filled 2018-05-14: qty 1

## 2018-05-14 MED ORDER — METHYLPREDNISOLONE SODIUM SUCC 125 MG IJ SOLR
80.0000 mg | Freq: Four times a day (QID) | INTRAMUSCULAR | Status: DC
  Administered 2018-05-14: 80 mg via INTRAVENOUS
  Filled 2018-05-14: qty 2

## 2018-05-14 MED ORDER — INSULIN ASPART 100 UNIT/ML ~~LOC~~ SOLN: 0.0000 [IU] | Freq: Every day | SUBCUTANEOUS | Status: DC

## 2018-05-14 MED ORDER — ACETAMINOPHEN 650 MG RE SUPP: 650.0000 mg | Freq: Four times a day (QID) | RECTAL | Status: DC | PRN

## 2018-05-14 MED ORDER — PRAVASTATIN SODIUM 20 MG PO TABS: 20.0000 mg | ORAL_TABLET | Freq: Every evening | ORAL | Status: DC

## 2018-05-14 MED ORDER — IPRATROPIUM-ALBUTEROL 0.5-2.5 (3) MG/3ML IN SOLN
3.0000 mL | Freq: Two times a day (BID) | RESPIRATORY_TRACT | Status: DC
  Administered 2018-05-14: 3 mL via RESPIRATORY_TRACT
  Filled 2018-05-14: qty 3

## 2018-05-14 MED ORDER — CYCLOBENZAPRINE HCL 10 MG PO TABS: 10.0000 mg | ORAL_TABLET | Freq: Every day | ORAL | Status: DC

## 2018-05-14 MED ORDER — ONDANSETRON HCL 4 MG/2ML IJ SOLN: 4.0000 mg | Freq: Once | INTRAMUSCULAR | Status: DC

## 2018-05-14 MED ORDER — MONTELUKAST SODIUM 10 MG PO TABS: 10.0000 mg | ORAL_TABLET | Freq: Every day | ORAL | Status: DC

## 2018-05-14 MED ORDER — INSULIN ASPART 100 UNIT/ML ~~LOC~~ SOLN
0.0000 [IU] | Freq: Three times a day (TID) | SUBCUTANEOUS | Status: DC
  Administered 2018-05-14: 3 [IU] via SUBCUTANEOUS
  Administered 2018-05-14: 5 [IU] via SUBCUTANEOUS
  Filled 2018-05-14: qty 1

## 2018-05-14 MED ORDER — VERAPAMIL HCL 120 MG PO TABS
60.0000 mg | ORAL_TABLET | Freq: Two times a day (BID) | ORAL | Status: DC
  Administered 2018-05-14: 60 mg via ORAL
  Filled 2018-05-14 (×3): qty 0.5

## 2018-05-14 MED ORDER — VITAMIN B-12 1000 MCG PO TABS
500.0000 ug | ORAL_TABLET | Freq: Every day | ORAL | Status: DC
  Administered 2018-05-14: 500 ug via ORAL
  Filled 2018-05-14 (×2): qty 1

## 2018-05-14 MED ORDER — IPRATROPIUM-ALBUTEROL 0.5-2.5 (3) MG/3ML IN SOLN
3.0000 mL | Freq: Four times a day (QID) | RESPIRATORY_TRACT | Status: DC
  Administered 2018-05-14: 3 mL via RESPIRATORY_TRACT
  Filled 2018-05-14: qty 3

## 2018-05-14 MED ORDER — PANTOPRAZOLE SODIUM 40 MG PO TBEC
40.0000 mg | DELAYED_RELEASE_TABLET | Freq: Every day | ORAL | Status: DC
  Administered 2018-05-14: 40 mg via ORAL
  Filled 2018-05-14: qty 1

## 2018-05-14 MED ORDER — GLUCOSAMINE-CHONDROITIN 500-400 MG PO TABS: 1.0000 | ORAL_TABLET | Freq: Two times a day (BID) | ORAL | Status: DC

## 2018-05-14 MED ORDER — BIOTIN 2.5 MG PO TABS: ORAL_TABLET | Freq: Every day | ORAL | Status: DC

## 2018-05-14 MED ORDER — ONDANSETRON HCL 4 MG/2ML IJ SOLN: 4.0000 mg | Freq: Four times a day (QID) | INTRAMUSCULAR | Status: DC | PRN

## 2018-05-14 MED ORDER — CALCIUM CARBONATE-VITAMIN D 500-200 MG-UNIT PO TABS: 1.0000 | ORAL_TABLET | Freq: Every day | ORAL | Status: DC

## 2018-05-14 MED ORDER — VITAMIN C 500 MG PO TABS: 500.0000 mg | ORAL_TABLET | Freq: Every day | ORAL | Status: DC

## 2018-05-14 MED ORDER — SODIUM CHLORIDE 0.9 % IV BOLUS
500.0000 mL | Freq: Once | INTRAVENOUS | Status: AC
  Administered 2018-05-14: 500 mL via INTRAVENOUS

## 2018-05-14 MED ORDER — ONDANSETRON HCL 4 MG PO TABS: 4.0000 mg | ORAL_TABLET | Freq: Four times a day (QID) | ORAL | Status: DC | PRN

## 2018-05-14 MED ORDER — VITAMIN B-12 1000 MCG PO TABS: 1000.0000 ug | ORAL_TABLET | Freq: Every day | ORAL | 0 refills | Status: AC

## 2018-05-14 MED ORDER — ADULT MULTIVITAMIN W/MINERALS CH
1.0000 | ORAL_TABLET | Freq: Every day | ORAL | Status: DC
  Administered 2018-05-14: 1 via ORAL
  Filled 2018-05-14: qty 1

## 2018-05-14 MED ORDER — LEVOTHYROXINE SODIUM 88 MCG PO TABS
88.0000 ug | ORAL_TABLET | Freq: Every day | ORAL | Status: DC
  Administered 2018-05-14: 88 ug via ORAL
  Filled 2018-05-14: qty 1

## 2018-05-14 MED ORDER — FAMOTIDINE 20 MG PO TABS
20.0000 mg | ORAL_TABLET | Freq: Every day | ORAL | Status: DC
  Administered 2018-05-14: 20 mg via ORAL
  Filled 2018-05-14: qty 1

## 2018-05-14 MED ORDER — CLORAZEPATE DIPOTASSIUM 7.5 MG PO TABS: 3.7500 mg | ORAL_TABLET | Freq: Every day | ORAL | Status: DC

## 2018-05-14 MED ORDER — HEPARIN SODIUM (PORCINE) 5000 UNIT/ML IJ SOLN
5000.0000 [IU] | Freq: Three times a day (TID) | INTRAMUSCULAR | Status: DC
  Administered 2018-05-14: 5000 [IU] via SUBCUTANEOUS
  Filled 2018-05-14 (×3): qty 1

## 2018-05-14 MED ORDER — ACETAMINOPHEN 500 MG PO TABS: 1000.0000 mg | ORAL_TABLET | Freq: Every day | ORAL | Status: DC

## 2018-05-14 MED ORDER — POTASSIUM CHLORIDE IN NACL 20-0.9 MEQ/L-% IV SOLN
INTRAVENOUS | Status: DC
  Administered 2018-05-14: 07:00:00 via INTRAVENOUS
  Filled 2018-05-14 (×2): qty 1000

## 2018-05-14 MED ORDER — PIOGLITAZONE HCL 15 MG PO TABS
15.0000 mg | ORAL_TABLET | Freq: Every day | ORAL | Status: DC
  Administered 2018-05-14: 15 mg via ORAL
  Filled 2018-05-14 (×2): qty 1

## 2018-05-14 NOTE — Care Management Note (Signed)
Case Management Note  Patient Details  Name: [name] MRN: [id number] Date of Birth: 1940-12-12  Subjective/Objective:                    Action/Plan: ED CM received call from Dr. Florene Glen concerning home oxygen and [facility] for a late discharge.    Expected Discharge Date:  ([place])               Expected Discharge Plan:  [place]  In-House Referral:     Discharge planning Services  CM Consult  Post Acute Care Choice:  Durable Medical Equipment, Home Health Choice offered to:  Patient  DME Arranged:  Oxygen DME Agency:  [facility] Arranged:  RN, PT, OT [facility] Agency:  [facility]  Status of Service:  Completed, signed off  If discussed at [place] of Stay Meetings, dates discussed:    Additional CommentsLaurena Slimmer, RN 05/14/2018, 7:07 PM

## 2018-05-14 NOTE — Consult Note (Signed)
Name: [name] MRN: [id number] DOB: 11-May-1941    ADMISSION DATE:  05/13/2018 CONSULTATION DATE:  05/14/2018  REFERRING MD :  Florene Glen  CHIEF COMPLAINT:  dyspnea  HISTORY OF PRESENT ILLNESS: 77 year old remote heavy smoker with a known diagnosis of IPF, last seen by my partner Dr. [name] in 01/2015 but lost to follow-up since then. She is admitted after a GI illness with nausea and vomiting or shortness of breath and found to have saturation 88% on room air, placed on 2 L oxygen .  Chest x-ray showed findings of severe diffuse lung disease with some evidence of progression compared to old chest x-ray.  There is no leukocytosis .  Respiratory viral panel is pending she was placed on high-dose IV steroids due to suspicion for IPF flare and hence we are consulted.  She reports a chronic cough for 11 years she has radiological fibrosis dating back to 2012 and had no disease progression up to her last follow-up in 2016.  I note that prior high-resolution CT chest from 2016 was consistent with UIP pattern with few areas of mild honeycombing.  She felt that nothing was being done on her pulmonary follow-up visits and hence stopped coming to our office.  She was not noted to be hypoxic with saturations staying around 94% on ambulation on her last visit.  She reports a chronic cough productive of minimal white sputum.  On questioning it seems that dyspnea has been ongoing for at least the past few months and was exacerbated by her acute GI illness.  She smoked heavily but quit more than 40 years ago, less than 20 pack years Environmental exposure-[place] and worked as a Insurance claims handler for a few years  STUDIES:  Autoimmune panel - May 2015: Normal hypersensitivity pneumonitis panel. Extensive autoimmune panel is negative except for ANA titer 1:160 homogenous pattern and p-ANCA screen positive at 1:640 but negative myeloperoxidase and PR 3 antibodies. Other antibodies are negative include  CCP, double-stranded DNA, myeloperoxidase, rheumatoid factor, c-ANCA screen, SSA, SSB, scleroderma 70     PAST MEDICAL HISTORY :   has a past medical history of Bronchiectasis, Chronic diarrhea, Chronic rhinitis, Diabetes mellitus ([place]), Hyperplastic colon polyp, IBS (irritable bowel syndrome), ILD (interstitial lung disease) ([place]), and Stenosis of rectum and anus.  has a past surgical history that includes Hand surgery (1991); Dilation and curettage of uterus; Cholecystectomy; tear duct surgery; and migraine. Prior to Admission medications   Medication Sig Start Date End Date Taking? Authorizing Provider  acetaminophen (TYLENOL) 500 MG tablet Take 1,000 mg by mouth at bedtime.   Yes [provider]  Ascorbic Acid (VITAMIN C) 500 MG tablet Take 500 mg by mouth at bedtime.    Yes [provider]  BIOTIN PO Take 1 tablet by mouth daily.   Yes [provider]  Calcium Carbonate-Vitamin D (CALCIUM 600-D) 600-400 MG-UNIT per tablet Take 1 tablet by mouth at bedtime.    Yes [provider]  clorazepate (TRANXENE) 3.75 MG tablet TAKE 1 TABLET BY MOUTH DAILY.MAY TAKE UP TO 2 DAILY IF NEEDED Patient taking differently: Take 3.75 mg by mouth daily.  05/10/15  Yes Nandigam, Venia Minks, MD  cyclobenzaprine (FLEXERIL) 10 MG tablet Take 10 mg by mouth at bedtime.    Yes [provider]  glucosamine-chondroitin 500-400 MG tablet Take 1 tablet by mouth 2 (two) times daily. Triple strength   Yes [provider]  levothyroxine (SYNTHROID, LEVOTHROID) 88 MCG tablet Take 88 mcg by  mouth daily.     Yes [provider]  Multiple Vitamin (MULTIVITAMIN) tablet Take 1 tablet by mouth daily.     Yes [provider]  pioglitazone (ACTOS) 15 MG tablet Take 15 mg by mouth daily.   Yes [provider]  pravastatin (PRAVACHOL) 20 MG tablet Take 20 mg by mouth every evening. 08/13/16  Yes [provider]  verapamil (CALAN) 120 MG tablet  Take 60 mg by mouth 2 (two) times daily.    Yes [provider]  dexlansoprazole (DEXILANT) 60 MG capsule Take one capsule every morning before breakfast Patient not taking: Reported on 06/20/2017 10/23/16   Kozlow, Donnamarie Poag, MD  montelukast (SINGULAIR) 10 MG tablet Take one tablet once daily as directed Patient not taking: Reported on 06/20/2017 10/23/16   Jiles Prows, MD  ranitidine (ZANTAC) 300 MG tablet Take one tablet every evening as directed Patient not taking: Reported on 05/30/2017 10/23/16   Jiles Prows, MD   Allergies  Allergen Reactions  . Penicillins Diarrhea    Has patient had a PCN reaction causing immediate rash, facial/tongue/throat swelling, SOB or lightheadedness with hypotension: Unknown Has patient had a PCN reaction causing severe rash involving mucus membranes or skin necrosis: Unknown Has patient had a PCN reaction that required hospitalization: No  Has patient had a PCN reaction occurring within the last 10 years: No  If all of the above answers are "NO", then may proceed with Cephalosporin use.   . Clarithromycin     Unknown reaction   . Cortisone Other (See Comments)    "Made her feel like ice water was running through her veins"   . Povidone-Iodine     Unknown reaction     FAMILY HISTORY:  family history includes Colon polyps in her sister; Diabetes in her sister; Heart disease in her mother. SOCIAL HISTORY:  reports that she quit smoking about 41 years ago. Her smoking use included cigarettes. She has a 45.00 pack-year smoking history. She has never used smokeless tobacco. She reports that she does not drink alcohol or use drugs.  REVIEW OF SYSTEMS:  Positive as above   Constitutional: Negative for fever, chills, weight loss, malaise/fatigue and diaphoresis.  HENT: Negative for hearing loss, ear pain, nosebleeds, congestion, sore throat, neck pain, tinnitus and ear discharge.   Eyes: Negative for blurred vision, double vision, photophobia, pain,  discharge and redness.  Respiratory: Negative for hemoptysis, sputum production, wheezing and stridor.   Cardiovascular: Negative for chest pain, palpitations, orthopnea, claudication, leg swelling and PND.  Gastrointestinal: Negative for heartburn, abdominal pain,  constipation, blood in stool and melena.  Genitourinary: Negative for dysuria, urgency, frequency, hematuria and flank pain.  Musculoskeletal: Negative for myalgias, back pain, joint pain and falls.  Skin: Negative for itching and rash.  Neurological: Negative for dizziness, tingling, tremors, sensory change, speech change, focal weakness, seizures, loss of consciousness, weakness and headaches.  Endo/Heme/Allergies: Negative for environmental allergies and polydipsia. Does not bruise/bleed easily.  SUBJECTIVE:   VITAL SIGNS: Temp:  [97.8 F (36.6 C)-98.5 F (36.9 C)] 97.8 F (36.6 C) (12/04 1054) Pulse Rate:  [70-89] 80 (12/04 1054) Resp:  [17-35] 24 (12/04 1054) BP: (82-132)/(56-94) 123/76 (12/04 1054) SpO2:  [88 %-100 %] 92 % (12/04 1054) Weight:  [83.5 kg] 83.5 kg (12/03 1942)  PHYSICAL EXAMINATION: Gen. [place], obese, in no distress, normal affect ENT - no lesions, no post nasal drip, class 2-3 airway Neck: No JVD, no thyromegaly, no carotid bruits Lungs: no use of  accessory muscles, no dullness to percussion, bibasal 1/3 rales ono rhonchi  Cardiovascular: Rhythm regular, heart sounds  normal, no murmurs or gallops, no peripheral edema Abdomen: soft and non-tender, no hepatosplenomegaly, BS normal. Musculoskeletal: No deformities, no cyanosis or clubbing Neuro:  alert, non focal, no tremors   Recent Labs  Lab 05/13/18 2053 05/13/18 2117 05/14/18 1016  NA 137 137 137  K 3.0* 3.0* 3.7  CL 98 97* 104  CO2 28  --  23  BUN 8 6* 10  CREATININE 0.85 0.80 0.77  GLUCOSE 134* 133* 278*   Recent Labs  Lab 05/13/18 2053 05/13/18 2117 05/14/18 1016  HGB 11.5* 11.2* 10.6*  HCT 35.0* 33.0* 32.4*  WBC 7.4  --   5.3  PLT 340  --  322   Dg Chest 2 View  Result Date: 05/13/2018 CLINICAL DATA:  Shortness of breath on exertion. EXAM: CHEST - 2 VIEW COMPARISON:  04/16/2017 FINDINGS: Diffuse opacities throughout the lungs, likely related to the patient's severe chronic interstitial lung disease/fibrosis changes as seen on prior CT. No definite acute process. Heart is normal size. No effusions or acute bony abnormality. IMPRESSION: Severe chronic lung disease.  No definite acute process. Electronically Signed   By: Rolm Baptise M.D.   On: 05/13/2018 20:14    ASSESSMENT / PLAN:  She certainly seems to have disease progression of her IPF compared to 2016.  Her symptoms of dyspnea or chronic, I doubt IPF flare.  As such I do not feel that she needs high-dose steroids  -We will proceed with high-resolution CT chest -Discontinue high-dose steroids since she is a diabetic -Check oxygen saturation at rest 92% and on ambulation and decide on need for oxygen based on this.  -Follow-up in pulmonary office as outpatient -Due to her chronic diarrhea, she would not be a good candidate for anti-fibrotic therapies  Kara Mead MD. [facility] Flood. [facility] Pager [phone] If no response call [phone]     05/14/2018, 11:21 AM

## 2018-05-14 NOTE — Progress Notes (Signed)
SATURATION QUALIFICATIONS: (This note is used to comply with regulatory documentation for home oxygen)  Patient Saturations on Room Air at Rest = 96%  Patient Saturations on Room Air while Ambulating = 88%  Patient Saturations on 2 Liters of oxygen while Ambulating = 95%  Please briefly explain why patient needs home oxygen: All other therapies have been tried and failed

## 2018-05-14 NOTE — ED Notes (Signed)
ED TO INPATIENT HANDOFF REPORT  Name/Age/Gender [name] 77 y.o. female  Code Status    Code Status Orders  (From admission, onward)         Start     Ordered   05/14/18 0548  Full code  Continuous     05/14/18 0548        Code Status History    This patient has a current code status but no historical code status.    Advance Directive Documentation     Most Recent Value  Type of Advance Directive  Healthcare Power of Attorney, Living will  Pre-existing out of facility DNR order (yellow form or pink MOST form)  -  "MOST" Form in Place?  -      Home/SNF/Other Home  Chief Complaint Diarrhea/Gen. Weakness/Emesis  Level of Care/Admitting Diagnosis ED Disposition    ED Disposition Condition Comment   Admit  Hospital Area: [facility] [[id number]]  Level of Care: Telemetry [5]  Admit to tele based on following criteria: Other see comments  Comments: hypoxia  Diagnosis: Hypoxemia [799.02.ICD-9-CM]  Admitting Physician: Elwyn Reach [[id number]]  Attending Physician: Elwyn Reach [[id number]]  Estimated length of stay: past midnight tomorrow  Certification:: I certify this patient will need inpatient services for at least 2 midnights  PT Class (Do Not Modify): Inpatient [101]  PT Acc Code (Do Not Modify): Private [1]       Medical History Past Medical History:  Diagnosis Date  . Bronchiectasis    PFTs August 23, 2010 VC 82%   dlco 62 > 129 CORRECTED  . Chronic diarrhea   . Chronic rhinitis    sinus CT ordered August 23, 2010  . Diabetes mellitus ([place])   . Hyperplastic colon polyp   . IBS (irritable bowel syndrome)   . ILD (interstitial lung disease) ([place])   . Stenosis of rectum and anus     Allergies Allergies  Allergen Reactions  . Penicillins Diarrhea    Has patient had a PCN reaction causing immediate rash, facial/tongue/throat swelling, SOB or lightheadedness with hypotension: Unknown Has patient had a PCN reaction causing severe  rash involving mucus membranes or skin necrosis: Unknown Has patient had a PCN reaction that required hospitalization: No  Has patient had a PCN reaction occurring within the last 10 years: No  If all of the above answers are "NO", then may proceed with Cephalosporin use.   . Clarithromycin     Unknown reaction   . Cortisone Other (See Comments)    "Made her feel like ice water was running through her veins"   . Povidone-Iodine     Unknown reaction     IV Location/Drains/Wounds Patient Lines/Drains/Airways Status   Active Line/Drains/Airways    Name:   Placement date:   Placement time:   Site:   Days:   Peripheral IV 05/13/18 Right Antecubital   05/13/18    2044    Antecubital   1          Labs/Imaging Results for orders placed or performed during the hospital encounter of 05/13/18 (from the past 48 hour(s))  Glucose, capillary     Status: Abnormal   Collection Time: 05/13/18  8:15 PM  Result Value Ref Range   Glucose-Capillary 123 (H) 70 - 99 mg/dL  Urinalysis, Routine w reflex microscopic     Status: Abnormal   Collection Time: 05/13/18  8:53 PM  Result Value Ref Range   Color, Urine YELLOW YELLOW  APPearance CLEAR CLEAR   Specific Gravity, Urine 1.016 1.005 - 1.030   pH 6.0 5.0 - 8.0   Glucose, UA NEGATIVE NEGATIVE mg/dL   Hgb urine dipstick SMALL (A) NEGATIVE   Bilirubin Urine NEGATIVE NEGATIVE   Ketones, ur NEGATIVE NEGATIVE mg/dL   Protein, ur 30 (A) NEGATIVE mg/dL   Nitrite NEGATIVE NEGATIVE   Leukocytes, UA TRACE (A) NEGATIVE   RBC / HPF 0-5 0 - 5 RBC/hpf   WBC, UA 6-10 0 - 5 WBC/hpf   Bacteria, UA RARE (A) NONE SEEN   Squamous Epithelial / LPF 0-5 0 - 5   Mucus PRESENT    Hyaline Casts, UA PRESENT     Comment: Performed at [facility], [place] [address], [place], [place] [address]  CBC with Differential/Platelet     Status: Abnormal   Collection Time: 05/13/18  8:53 PM  Result Value Ref Range   WBC 7.4 4.0 - 10.5 K/uL   RBC 3.75 (L)  3.87 - 5.11 MIL/uL   Hemoglobin 11.5 (L) 12.0 - 15.0 g/dL   HCT 35.0 (L) 36.0 - 46.0 %   MCV 93.3 80.0 - 100.0 fL   MCH 30.7 26.0 - 34.0 pg   MCHC 32.9 30.0 - 36.0 g/dL   RDW 12.7 11.5 - 15.5 %   Platelets 340 150 - 400 K/uL   nRBC 0.0 0.0 - 0.2 %   Neutrophils Relative % 67 %   Neutro Abs 5.0 1.7 - 7.7 K/uL   Lymphocytes Relative 24 %   Lymphs Abs 1.8 0.7 - 4.0 K/uL   Monocytes Relative 6 %   Monocytes Absolute 0.4 0.1 - 1.0 K/uL   Eosinophils Relative 2 %   Eosinophils Absolute 0.1 0.0 - 0.5 K/uL   Basophils Relative 0 %   Basophils Absolute 0.0 0.0 - 0.1 K/uL   Immature Granulocytes 1 %   Abs Immature Granulocytes 0.04 0.00 - 0.07 K/uL    Comment: Performed at [facility], [place] [address] [place], [place] [address]  Comprehensive metabolic panel     Status: Abnormal   Collection Time: 05/13/18  8:53 PM  Result Value Ref Range   Sodium 137 135 - 145 mmol/L   Potassium 3.0 (L) 3.5 - 5.1 mmol/L   Chloride 98 98 - 111 mmol/L   CO2 28 22 - 32 mmol/L   Glucose, Bld 134 (H) 70 - 99 mg/dL   BUN 8 8 - 23 mg/dL   Creatinine, Ser 0.85 0.44 - 1.00 mg/dL   Calcium 8.7 (L) 8.9 - 10.3 mg/dL   Total Protein 7.7 6.5 - 8.1 g/dL   Albumin 2.9 (L) 3.5 - 5.0 g/dL   AST 27 15 - 41 U/L   ALT 16 0 - 44 U/L   Alkaline Phosphatase 61 38 - 126 U/L   Total Bilirubin 0.6 0.3 - 1.2 mg/dL   GFR calc non Af Amer >60 >60 mL/min   GFR calc Af Amer >60 >60 mL/min   Anion gap 11 5 - 15    Comment: Performed at [facility], [place] [address] [place], [place] [address]  Lipase, blood     Status: None   Collection Time: 05/13/18  8:53 PM  Result Value Ref Range   Lipase 34 11 - 51 U/L    Comment: Performed at [facility], [place] [address], [place], [place] [address]  POCT i-Stat troponin I     Status: None   Collection Time: 05/13/18  9:15 PM  Result Value Ref Range   Troponin i, poc 0.01 0.00 - 0.08 ng/mL   Comment 3            Comment: Due to  the release kinetics of cTnI, a negative result within the first hours of the onset of symptoms does not rule out myocardial infarction with certainty. If myocardial infarction is still suspected, repeat the test at appropriate intervals.   I-STAT, chem 8     Status: Abnormal   Collection Time: 05/13/18  9:17 PM  Result Value Ref Range   Sodium 137 135 - 145 mmol/L   Potassium 3.0 (L) 3.5 - 5.1 mmol/L   Chloride 97 (L) 98 - 111 mmol/L   BUN 6 (L) 8 - 23 mg/dL   Creatinine, Ser 0.80 0.44 - 1.00 mg/dL   Glucose, Bld 133 (H) 70 - 99 mg/dL   Calcium, Ion 1.10 (L) 1.15 - 1.40 mmol/L   TCO2 29 22 - 32 mmol/L   Hemoglobin 11.2 (L) 12.0 - 15.0 g/dL   HCT 33.0 (L) 36.0 - 46.0 %  CBG monitoring, ED     Status: Abnormal   Collection Time: 05/14/18  7:34 AM  Result Value Ref Range   Glucose-Capillary 223 (H) 70 - 99 mg/dL   Dg Chest 2 View  Result Date: 05/13/2018 CLINICAL DATA:  Shortness of breath on exertion. EXAM: CHEST - 2 VIEW COMPARISON:  04/16/2017 FINDINGS: Diffuse opacities throughout the lungs, likely related to the patient's severe chronic interstitial lung disease/fibrosis changes as seen on prior CT. No definite acute process. Heart is normal size. No effusions or acute bony abnormality. IMPRESSION: Severe chronic lung disease.  No definite acute process. Electronically Signed   By: Rolm Baptise M.D.   On: 05/13/2018 20:14    Pending Labs Unresulted Labs (From admission, onward)    Start     Ordered   05/14/18 2956  Brain natriuretic peptide  Once,   R     05/14/18 0921   05/14/18 0909  Respiratory Panel by PCR  (Respiratory virus panel with precautions)  Once,   R     05/14/18 0908   05/14/18 0548  CBC  (heparin)  Once,   R    Comments:  Baseline for heparin therapy IF NOT ALREADY DRAWN.  Notify MD if PLT < 100 K.    05/14/18 0548   05/14/18 0548  Comprehensive metabolic panel  Tomorrow morning,   R     05/14/18 0548          Vitals/Pain Today's Vitals   05/14/18  0715 05/14/18 0721 05/14/18 0733 05/14/18 0923  BP: 109/61  (!) 102/56 104/78  Pulse: 79  77 81  Resp:      Temp:      TempSrc:      SpO2: 93% 91% 97% 95%  Weight:      Height:      PainSc:        Isolation Precautions Droplet precaution  Medications Medications  ondansetron (ZOFRAN) injection 4 mg (4 mg Intravenous Refused 05/13/18 2243)  heparin injection 5,000 Units (5,000 Units Subcutaneous Given 05/14/18 0826)  0.9 % NaCl with KCl 20 mEq/ L  infusion ( Intravenous New Bag/Given 05/14/18 0640)  acetaminophen (TYLENOL) tablet 650 mg (has no administration in time range)    Or  acetaminophen (TYLENOL) suppository 650 mg (has no administration in time range)  ondansetron (ZOFRAN) tablet 4 mg (has no administration in time range)    Or  ondansetron (ZOFRAN) injection 4 mg (has no administration in time range)  methylPREDNISolone sodium succinate (SOLU-MEDROL) 125 mg/2 mL injection 80 mg (80 mg Intravenous Given 05/14/18 0641)  insulin aspart (novoLOG) injection 0-9 Units (3 Units Subcutaneous Given 05/14/18 0826)  insulin aspart (novoLOG) injection 0-5 Units (has no administration in time range)  calcium-vitamin D (OSCAL WITH D) 500-200 MG-UNIT per tablet 1 tablet (has no administration in time range)  cyclobenzaprine (FLEXERIL) tablet 10 mg (has no administration in time range)  levothyroxine (SYNTHROID, LEVOTHROID) tablet 88 mcg (88 mcg Oral Given 05/14/18 0825)  multivitamin with minerals tablet 1 tablet (has no administration in time range)  verapamil (CALAN) tablet 60 mg (has no administration in time range)  vitamin C (ASCORBIC ACID) tablet 500 mg (has no administration in time range)  pioglitazone (ACTOS) tablet 15 mg (has no administration in time range)  clorazepate (TRANXENE) tablet 3.75 mg (has no administration in time range)  vitamin B-12 (CYANOCOBALAMIN) tablet 500 mcg (has no administration in time range)  acetaminophen (TYLENOL) tablet 1,000 mg (has no administration  in time range)  pravastatin (PRAVACHOL) tablet 20 mg (has no administration in time range)  montelukast (SINGULAIR) tablet 10 mg (has no administration in time range)  pantoprazole (PROTONIX) EC tablet 40 mg (has no administration in time range)  famotidine (PEPCID) tablet 20 mg (has no administration in time range)  ipratropium-albuterol (DUONEB) 0.5-2.5 (3) MG/3ML nebulizer solution 3 mL (has no administration in time range)  sodium chloride 0.9 % bolus 1,000 mL (0 mLs Intravenous Stopped 05/14/18 0406)  methylPREDNISolone sodium succinate (SOLU-MEDROL) 125 mg/2 mL injection 125 mg (125 mg Intravenous Given 05/13/18 2243)  potassium chloride SA (K-DUR,KLOR-CON) CR tablet 40 mEq (40 mEq Oral Given 05/13/18 2243)  sodium chloride 0.9 % bolus 500 mL (0 mLs Intravenous Stopped 05/14/18 0531)    Mobility walks with person assist

## 2018-05-14 NOTE — ED Notes (Signed)
Patient transported to MRI 

## 2018-05-14 NOTE — Discharge Summary (Signed)
**Note De-Identified vi Obfusction** Physicin Dischrge [place]  Justise Ehmnn [name] DOB: [birth year] DOA: 05/13/2018  PCP: Huln Fess, MD  Admit dte: 05/13/2018 Dischrge dte: 05/14/2018  Time spent: 35 minutes  Recommendtions for Outptient Follow-up:  1. Follow outptient CBC/CMP 2. Ensure follow up with pulmonology 3. High res chest CT with enlrging mss like re in periphery of LUL concerning for slow growing neoplsm.  Discussed with pt, discussed importnce of follow up nd messge sent to pulm. 4. Pt sent home on O2, wen s tolerted 5. 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 femle with medicl history significnt of pulmonry fibrosis with bronchiectsis who hs not seen pulmonologist in 3 yers, irritble bowel syndrome with severe chronic dirrhe, hyperlipidemi, hypothyroidism, dibetes, who cme to the ER with significnt shortness of breth nd hypoxemi.  Ptient hd  flreup of her IBS nd hs been hving significnt dirrhe for the lst couple of dys.  He hs been so severe tht she becme completely wek.  She believes she becme more wlked up..  She strted hving significnt shortness of breth tody.  Tried her inhlers but not helping.  When she cme to the ER her oxygen sts ws in the 80s.  She is struggling to brethe.  Ptient is plced on oxygen t the moment nd is being dmitted with cute on chronic respirtory filure with hypoxemi.[name]  She ws dmitted for worsening shortness of breth.  There ws initilly  concern for n IPF flre.  Pulmonology ws c/s, but pt hd been wened to RA when pulm sw her.  High res CT scn performed.  Steroids d/c'd.  Outptient follow up recommended.  See below for dditionl detils Hospitl Course:   #1 cute hypoxemi: Possibly 2/2 infection, but with hx of IPF, flre lso concerning.  Lst sw Dr. Chse Cller in 2016, t which time he suspected IPF most likely. - Follow RVP (negtive), BNP (negtive) - Continue steroids - d/c'd steroids per pulm  - Wen O2 s tolerted - pt wened to RA.  Dischrged with home O2, required 2 L with mbultion.  Continue to wen s tolerted t home. 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  #6 hypothyroidism:Agin continue home regimen.  #7 dibetes:Sliding scle insulin will be initited. Monitor closely.   Procedures:  none   Consulttions:  pulmonology  Dischrge Exm: Vitls:   05/14/18 1935 05/14/18 2002  BP:  (!) 116/55  Pulse:  92  Resp:  18  Temp:  (!) 97.3 F (36.3 C)  SpO2: 93% 90%   Ct needs injections Nees to **Note De-Identified vi Obfusction** go home bc she's worried bt cts nd doesn't wnt nything to hppen to them Feeling better Fmily t bedside, sister, prtner SOB fter virl infection 7-10 dys go  Generl: No cute distress. Crdiovsculr: Hert sounds show  regulr rte, nd rhythm. Lungs: diffuse crckes Abdomen: Soft, nontender, nondistended  Neurologicl: Alert nd oriented 3. Moves ll extremities 4 . Crnil nerves II through XII  grossly intct. Skin: Wrm nd dry. No rshes or lesions. Extremities: No clubbing or cynosis. No edem.  Psychitric: Mood nd ffect re norml. Insight nd judgment re pproprite.   Dischrge Instructions   Dischrge Instructions    Cll MD for:  difficulty brething, hedche or visul disturbnces   Complete by:  As directed    Cll MD for:  extreme ftigue   Complete by:  As directed    Cll MD for:  persistnt dizziness or light-hededness   Complete by:  As directed    Cll MD for:  persistnt nuse nd vomiting   Complete by:  As directed    Cll MD for:  redness, tenderness, or signs of infection (pin, swelling, redness, odor or green/yellow dischrge round incision site)   Complete by:  As directed    Cll MD for:  severe uncontrolled pin   Complete by:  As directed    Cll MD for:  temperture >100.4   Complete by:  As directed    Diet - low sodium hert helthy   Complete by:  As directed    Dischrge instructions   Complete by:  As directed    You were seen for concern for shortness of breth.  Pulmonology sw you nd did not think this ws consistent with  flre of your interstitil pulmonry fibrosis.  They're recommending outptient follow up with pulmonology.    We've set up home oxygen for you.  Use this when you're up nd bout.  Return for new, recurrent, or worsening symptoms.  Plese sk your PCP to request records from this hospitliztion so they know wht ws done nd wht the next steps will be.   Increse ctivity slowly   Complete by:  As directed      Allergies s of 05/14/2018      Rections   Penicillins Dirrhe   Hs ptient hd  PCN rection cusing immedite rsh, fcil/tongue/throt swelling, SOB or lighthededness with hypotension: Unknown Hs ptient hd  PCN rection cusing severe rsh involving mucus membrnes or skin necrosis: Unknown Hs ptient hd  PCN rection tht required hospitliztion: No  Hs ptient hd   PCN rection occurring within the lst 10 yers: No  If ll of the bove nswers re "NO", then my proceed with Cephlosporin use.   Influenz Vccines Other (See Comments)   Severe nuse, vomiting nd body ches-md told her not to tke it nymore   Clrithromycin    Unknown rection    Cortisone Other (See Comments)   "Mde her feel like ice wter ws running through her veins"    Povidone-iodine    Unknown rection       Mediction List    TAKE these medictions   cetminophen 500 MG tblet Commonly known s:  TYLENOL Tke 1,000 mg by mouth t bedtime.   lbuterol 108 (90 Bse) MCG/ACT inhler Commonly known s:  PROVENTIL HFA;VENTOLIN HFA Inhle 2 puffs into the lungs every 6 (six) hours s needed for wheezing or shortness of breth.   BIOTIN PO Tke 1 tblet by mouth dily.   CALCIUM 600-D **Note De-Identified vi Obfusction** 600-400 MG-UNIT tblet Generic drug:  Clcium Crbonte-Vitmin D Tke 1 tblet by mouth t bedtime.   clorzepte 3.75 MG tblet Commonly known s:  TRANXENE TAKE 1 TABLET BY MOUTH DAILY.MAY TAKE UP TO 2 DAILY IF NEEDED Wht chnged:    how much to tke  how to tke this  when to tke this  dditionl instructions   cyclobenzprine 10 MG tblet Commonly known s:  FLEXERIL Tke 10 mg by mouth t bedtime.   dexlnsoprzole 60 MG cpsule Commonly known s:  DEXILANT Tke one cpsule every morning before brekfst   glucosmine-chondroitin 500-400 MG tblet Tke 1 tblet by mouth 2 (two) times dily. Triple strength   levothyroxine 88 MCG tblet Commonly known s:  SYNTHROID, LEVOTHROID Tke 88 mcg by mouth dily.   montelukst 10 MG tblet Commonly known s:  SINGULAIR Tke one tblet once dily s directed   multivitmin tblet Tke 1 tblet by mouth dily.   pioglitzone 15 MG tblet Commonly known s:  ACTOS Tke 15 mg by mouth dily.   prvsttin 20 MG tblet Commonly known s:  PRAVACHOL Tke 20 mg by mouth every evening.   rnitidine 300 MG  tblet Commonly known s:  ZANTAC Tke one tblet every evening s directed   verpmil 120 MG tblet Commonly known s:  CALAN Tke 60 mg by mouth 2 (two) times dily.   vitmin B-12 1000 MCG tblet Commonly known s:  CYANOCOBALAMIN Tke 1 tblet (1,000 mcg totl) by mouth dily. Wht chnged:    mediction strength  how much to tke   vitmin C 500 MG tblet Commonly known s:  ASCORBIC ACID Tke 500 mg by mouth t bedtime.            [facility] Equipment  (From dmission, onwrd)         Strt     Ordered   05/14/18 1725  DME Oxygen  Once    Comments:  SATURATION QUALIFICATIONS: (This note is used to comply with regultory documenttion for home oxygen)  Ptient Sturtions on Room Air t Rest = 96%  Ptient Sturtions on Room Air while Ambulting = 88%  Ptient Sturtions on 2 Liters of oxygen while Ambulting = 95%  Plese briefly explin why ptient needs home oxygen: All other therpies hve been tried nd filed  Question Answer Comment  Mode or (Route) Msk   Liters per Minute 2   Frequency Continuous (sttionry nd portble oxygen unit needed)   Oxygen conserving device Yes   Oxygen delivery system Gs      05/14/18 1724         Allergies  Allergen Rections  . Penicillins Dirrhe    Hs ptient hd  PCN rection cusing immedite rsh, fcil/tongue/throt swelling, SOB or lighthededness with hypotension: Unknown Hs ptient hd  PCN rection cusing severe rsh involving mucus membrnes or skin necrosis: Unknown Hs ptient hd  PCN rection tht required hospitliztion: No  Hs ptient hd  PCN rection occurring within the lst 10 yers: No  If ll of the bove nswers re "NO", then my proceed with Cephlosporin use.   . Influenz Vccines Other (See Comments)    Severe nuse, vomiting nd body ches-md told her not to tke it nymore  . Clrithromycin     Unknown rection   . Cortisone Other (See Comments)     "Mde her feel like ice wter ws running through her veins"   . Povidone-Iodine     Unknown rection **Note De-Identified vi Obfusction** Follow-up Informtion    [facility] Follow up.   Specilty:  [facility] Why:  [place], Physicl [facility], Occuptionl Ther[facility]. [facility] nurse will contct you by phone 24-48 hours fter dischrge to rrnge the [place] ssessment. Contct informtion: [place] [id number] [place] Follow up.   Why:  Home oxygen delivery t the hospitl prior to dischrge home tody  Contct informtion: [place] [address] [phone]        Huln Fess, MD Follow up.   Specilty:  [facility] Why:  Plese cll to follow up with your PCP Contct informtion: [address] [place] [address] [phone]        Brnd Mles, MD Follow up.   Specilty:  Pulmonry Disese Why:  Plese cll for follow up with Dr. Roslin Hwking informtion: [place] [id number] [phone]            The results of significnt dignostics from this hospitliztion (including imging, microbiology, ncillry nd lbortory) re listed below for reference.    Significnt Dignostic Studies: Dg Chest 2 View  Result Dte: 05/13/2018 CLINICAL DATA:  Shortness of breth on exertion. EXAM: CHEST - 2 VIEW COMPARISON:  04/16/2017 FINDINGS: Diffuse opcities throughout the lungs, likely relted to the ptient's severe chronic interstitil lung disese/fibrosis chnges s seen on prior CT. No definite cute process. Hert is norml size. No effusions or cute bony bnormlity. IMPRESSION: Severe chronic lung disese.  No definite cute process. Electroniclly Signed   By: Rolm Bptise M.D.   On: 05/13/2018 20:14   Ct Chest High Resolution  Result Dte: 05/14/2018 CLINICAL DATA:  62 yer old femle  with history of interstitil lung disese. Follow-up study. EXAM: CT CHEST WITHOUT CONTRAST TECHNIQUE: Multidetector CT imging of the chest ws performed following the stndrd protocol without intrvenous contrst. High resolution imging of the lungs, s well s inspirtory nd expirtory imging, ws performed. COMPARISON:  Chest CT 05/10/2017. FINDINGS: Crdiovsculr: Hert size is norml. There is no significnt pericrdil fluid, thickening or pericrdil clcifiction. There is ortic therosclerosis, s well s therosclerosis of the gret vessels of the medistinum nd the coronry rteries, including clcified therosclerotic plque in the left min, left nterior descending, left circumflex nd right coronry rteries. Medistinum/Nodes: No pthologiclly enlrged medistinl or hilr lymph nodes. Plese note tht ccurte exclusion of hilr denopthy is limited on noncontrst CT scns. Esophgus is unremrkble in ppernce. No xillry lymphdenopthy. Lungs/Pleur: High-resolution imges demonstrte widespred but ptchy multifocl ground-glss ttenution throughout the lungs bilterlly with scttered res of septl thickening, thickening of the peribronchovsculr interstitium nd some rre res of mild cylindricl bronchiectsis. Peripherl bronchiolectsis nd severl scttered res of peripherl honeycombing re noted. These findings hve no definitive crniocudl grdient, nd the honeycombing is most evident in the upper lobes of the lungs bilterlly. Overll, there hs been mild progression of disese compred to the prior study 05/10/2017, most notbly in terms of the extent of ground-glss ttenution. Inspirtory nd expirtory imging demonstrtes moderte ir trpping indictive of smll irwys disese. In the periphery of the left upper lobe there is  enlrging mss-like ppering re which hs internl ir bronchogrms. This is highly irregulr in shpe nd therefore  difficult to mesure, however, the most focl portion of this is best demonstrted on xil imge 55 of series 4 nd coronl imge 46 of **Note De-Identified vi Obfusction** series 7, where this lesion currently mesures 2.5 x 2.0 x 3.3 cm. Upper bdomen: ortic therosclerosis.  Sttus post cholecystectomy. Musculoskeletl: Smll sclerotic lesion with nrrow zone of trnsition in the left side of the T3 vertebrl body, stble compred to the prior study, most comptible with  smll bone islnd. There re no other lrger more ggressive ppering lytic or blstic lesions noted in the visulized portions of the skeleton. IMPRESSION: 1. Persistent evidence of interstitil lung disese with notble progression compred to the prior exmintion from 05/10/2017. 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 2. Enlrging mss-like re in the periphery of the left upper lobe, currently mesuring 2.5 x 2.0 x 3.3 cm, with internl ir bronchogrms. The possibility of  slow-growing neoplsm such s  primry bronchogenic denocrcinom should be considered. Further evlution with nonemergent PET-CT is recommended in the ner future. 3. ortic therosclerosis, in ddition to left min nd 3 vessel coronry rtery disese. ssessment for potentil risk fctor modifiction, dietry therpy or phrmcologic therpy my be wrrnted, if cliniclly indicted. ortic therosclerosis (ICD10-I70.0). Electroniclly Signed   By: Vinnie Lngton M.D.   On: 05/14/2018 19:55    Microbiology: Recent Results (from the pst 240 hour(s))  Respirtory Pnel by PCR     Sttus: None   Collection Time: 05/14/18  9:09 M  Result Vlue Ref Rnge Sttus   denovirus NOT DETECTED NOT DETECTED Finl   Coronvirus 229E NOT DETECTED  NOT DETECTED Finl   Coronvirus HKU1 NOT DETECTED NOT DETECTED Finl   Coronvirus NL63 NOT DETECTED NOT DETECTED Finl   Coronvirus OC43 NOT DETECTED NOT DETECTED Finl   Metpneumovirus NOT DETECTED NOT DETECTED Finl   Rhinovirus / Enterovirus NOT DETECTED NOT DETECTED Finl   Influenz  NOT DETECTED NOT DETECTED Finl   Influenz B NOT DETECTED NOT DETECTED Finl   Prinfluenz Virus 1 NOT DETECTED NOT DETECTED Finl   Prinfluenz Virus 2 NOT DETECTED NOT DETECTED Finl   Prinfluenz Virus 3 NOT DETECTED NOT DETECTED Finl   Prinfluenz Virus 4 NOT DETECTED NOT DETECTED Finl   Respirtory Syncytil Virus NOT DETECTED NOT DETECTED Finl   Bordetell pertussis NOT DETECTED NOT DETECTED Finl   Chlmydophil pneumonie NOT DETECTED NOT DETECTED Finl   Mycoplsm pneumonie NOT DETECTED NOT DETECTED Finl    Comment: Performed t [facility], [address], [place], [place] [address]     Lbs: Bsic Metbolic Pnel: Recent Lbs  Lb 05/13/18 2053 05/13/18 2117 05/14/18 1016  N 137 137 137  K 3.0* 3.0* 3.7  CL 98 97* 104  CO2 28  --  23  GLUCOSE 134* 133* 278*  BUN 8 6* 10  CRETININE 0.85 0.80 0.77  CLCIUM 8.7*  --  8.0*   Liver Function Tests: Recent Lbs  Lb 05/13/18 2053 05/14/18 1016  ST 27 25  LT 16 16  LKPHOS 61 61  BILITOT 0.6 0.6  PROT 7.7 7.3  LBUMIN 2.9* 2.7*   Recent Lbs  Lb 05/13/18 2053  LIPSE 34   No results for input(s): MMONI in the lst 168 hours. CBC: Recent Lbs  Lb 05/13/18 2053 05/13/18 2117 05/14/18 1016  WBC 7.4  --  5.3  NEUTROBS 5.0  --   --   HGB 11.5* 11.2* 10.6*  HCT 35.0* 33.0* 32.4*  MCV 93.3  -- 93.1  PLT 340  --  322   Cardiac Enzymes: No results for input(s): CKTOTAL, CKMB, CKMBINDEX, TROPONINI in the last 168 hours. BNP: BNP (last 3 results) Recent Labs    05/14/18 1016  BNP 82.1    ProBNP (last 3 results) No results for input(s): PROBNP in the last 8760  hours.  CBG: Recent Labs  Lab 05/13/18 2015 05/14/18 0734 05/14/18 1150 05/14/18 1641  GLUCAP 123* 223* 261* 180*       Signed:  Fayrene Helper MD.  [facility] 05/14/2018, 8:05 PM

## 2018-05-14 NOTE — Discharge Planning (Signed)
Home Health Quality Rating List    Home health agencies that serve [phone]. Your favorite home health agencies  [place] of Patient Care Rating Patient Survey Summary Rating  [place]  [phone] 4 out of 5 stars 4 out of [place]  [phone] 3 out of 5 stars 5 out of [place]  [phone] 3 out of 5 stars 4 out of [place]  [phone] 4  out of 5 stars 4 out of [place]  [phone] 4 out of 5 stars 4 out of [place]  [phone] 4  out of 5 stars 4 out of [place]  [phone] 4  out of 5 stars 3 out of [place]  [phone] 4 out of 5 stars 4 out of 5 stars  [facility] [place]  [phone] 4 out of 5 stars 4 out of [place]  [phone] 3 out of 5 stars 4 out of 5 stars  [facility]  [phone] 3  out of 5 stars [facility] [place]  [phone] 3  out of 5 stars 4 out of [place]  [phone] 3 out of 5 stars 4 out of [place]  [phone] 3  out of 5 stars 3 out of [place]  [phone] 3  out of 5 stars 4 out of [place]  [phone] 5 out of 5 stars 3 out of 5 stars

## 2018-05-14 NOTE — Progress Notes (Signed)
Pts o2 saturation is 92% on room air at rest. Will walk and see what O2 saturation.

## 2018-05-14 NOTE — Progress Notes (Signed)
Reviewed discharge plan with Patient, [facility] reviewed oxygen setup. discharged to home, escorted off unit at 2045 by family

## 2018-05-14 NOTE — Care Management (Signed)
[facility] ED CM received call from Dr. Florene Glen at [facility] concerning late discharge with home oxygen need. CM explained the limitation due to after hours. CM will contact [place] after hours line and faxed referral with oxygen qualifying note awaiting processing for delivery tonight prior to hospital discharge. Updated Dr. Florene Glen and Marye Round RN.  CM and patient discussed transitional care planning, patient stated care goal " I want to get better soon" Patient is agreeable with waiting until home oxygen is processed.

## 2018-05-21 NOTE — Progress Notes (Addendum)
@Patient  ID: [name], female    DOB: [birth year], 77 y.o.   MRN: [id number]  Chief Complaint  Patient presents with  . 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  45-pack-year smoking history Maintenance: None Pt of: Dr. [name]  Recent [facility] Encounters:   05/14/18 - Consult Note - Elsworth Soho  77 year old remote heavy smoker with known diagnosis of IPF previously managed by Dr. [name] in clinic.  Patient was last seen in clinic in 2016 and was lost to follow-up since then.  She was admitted this hospital stay after GI illness.  I do not believe the patient has having an IPF flare and does not need high-dose steroids. Plan: Follow-up with pulmonary outpatient, high-res CT, I do not believe the patient would be a candidate for anti-fibrotic's as patient has chronic diarrhea  05/22/2018  - Visit   77 year old female former smoker presenting to our office today for hospital follow-up.  Patient was recently seen in the hospital after a GI illness.  Patient was consulted by Dr. Elsworth Soho inpatient and patient was informed to follow-up with their office outpatient.  Patient was previously managed by Dr. [name] and would like to remain with him.  When patient was inpatient they completed a high-res CT on 05/14/2018 which was indeterminate for UIP and favored hypersensitivity pneumonitis, also showing a left upper lobe mass which had not been previously seen on previous CTs.  Patient is currently using 2 L via nasal cannula with exertion.  Patient reports that this maintains her oxygen saturations greater than 90%.  Patient has been checking her oxygen levels at home.   Previous hypersensitivity pneumonitis panel in 2018 is  negative.   Tests:   Autoimmune panel - May 2015: Normal hypersensitivity pneumonitis panel. Extensive autoimmune panel is negative except for ANA titer 1:160 homogenous pattern and p-ANCA screen positive at 1:640 but negative myeloperoxidase and PR 3 antibodies. Other antibodies are negative include CCP, double-stranded DNA, myeloperoxidase, rheumatoid factor, c-ANCA screen, SSA, SSB, scleroderma 70  05/14/2018-CT chest high-res-persistent evidence of interstitial lung disease with notable progression compared with prior examination from 05/10/2017.  Indeterminate for UIP per ATS guidelines, the overall spectrum of findings strongly favored to reflect chronic hypersensitivity pneumonitis, enlarging masslike area in the periphery of left upper lobe currently measuring 2.5 x 2 x 3.3 cm with internal air bronchograms, possibility of slow-growing neoplasm such as primary bronchogenic adenocarcinoma could be considered  [place] [facility] ILD questionnaire  Symptoms: Shortness of breath began gradually.  Patient has had shortness of breath for the last 12 years but has worsened over the last 3 to 4 weeks.  Cough started 11 to 12 years ago and it is still the same.  It is a moderate cough.  Do not cough at night.  To bring up phlegm.  Phlegm color is clear. Past medical history: Type 2 diabetes.  Thyroid disease.  One episode of pneumonia.  One episode of pleurisy Review of symptoms: Patient endorses: Fatigue, occasional joint stiffness, nausea, heartburn Family history: Patient denies any family history to pulmonary fibrosis, COPD, asthma, sarcoidosis, cystic fibrosis, hypersensitivity pneumonitis, autoimmune diseases. Exposure history: Former tobacco smoker quit 1977.  45-pack-year smoking history.  Denies cigar, pipe, vaping.  Denies non-tobacco  use or illicit drug use. Home and hobby details: Single-family home.  [place] residence.  Have lived at home since 1975.  Age of home is 40.5  years.  This is not a damp living environment.  There is multi-mildew in shower curtain.  Bathroom does have mold and mildew.  Patient does not use humidifier or CPAP or nebulizer.  Patient does not seem iron.  Patient does not have a Jacuzzi or misting found.  Patient did have a pet parakeet as a teenager.  Patient does not have any pet gerbils hamsters rabbits or rodents in the house.  Patient does have feather pillows but there are 3 pillows not to sleep on.  There is not mold in her [facility] duct system.  She does not play any wind instruments.  Patient does like to garden (flowers).  Patient does have mulch in her outdoor gardens.  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  Patient had a parakeet as a teenager.  Patient patient has a feather duvet.  Patient does paint.  Patient sews.  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  Patient has cats and dogs at home. Medication history: Patient denies any medication exposures.    FENO:  No results found for: NITRICOXIDE  PFT: PFT Results Latest Ref Rng & Units 07/15/2014 10/30/2013  FVC-Pre L 2.44 2.41  FVC-Predicted Pre % 81 79  FVC-Post L 2.46 2.40  FVC-Predicted Post % 81 79  Pre FEV1/FVC % % 87 85  Post FEV1/FCV % % 88 87  FEV1-Pre L 2.13 2.05  FEV1-Predicted Pre % 94 89  FEV1-Post L 2.17 2.10  DLCO UNC% % 60 61  DLCO COR %Predicted % 90 91  TLC L 3.68 3.81  TLC % Predicted % 70 73  RV % Predicted % 52 51    Imaging: Dg Chest 2 View  Result Date: 05/13/2018 CLINICAL DATA:  Shortness of breath on exertion. EXAM: CHEST - 2 VIEW COMPARISON:   04/16/2017 FINDINGS: Diffuse opacities throughout the lungs, likely related to the patient's severe chronic interstitial lung disease/fibrosis changes as seen on prior CT. No definite acute process. Heart is normal size. No effusions or acute bony abnormality. IMPRESSION: Severe chronic lung disease.  No definite acute process. Electronically Signed   By: Rolm Baptise M.D.   On: 05/13/2018 20:14   Ct Chest High Resolution  Result Date: 05/14/2018 CLINICAL DATA:  77 year old female with history of interstitial lung disease. Follow-up study. EXAM: CT CHEST WITHOUT CONTRAST TECHNIQUE: Multidetector CT imaging of the chest was performed following the standard protocol without intravenous contrast. High resolution imaging of the lungs, as well as inspiratory and expiratory imaging, was performed. COMPARISON:  Chest CT 05/10/2017. FINDINGS: Cardiovascular: Heart size is normal. There is no significant pericardial fluid, thickening or pericardial calcification. There is aortic atherosclerosis, as well as atherosclerosis of the great vessels of the mediastinum and the coronary arteries, including calcified atherosclerotic plaque in the left main, left anterior descending, left circumflex and right coronary arteries. Mediastinum/Nodes: No pathologically enlarged mediastinal or hilar lymph nodes. Please note that accurate exclusion of hilar adenopathy is limited on noncontrast CT scans. Esophagus  is unremarkable in appearance. No axillary lymphadenopathy. Lungs/Pleura: High-resolution images demonstrate widespread but patchy multifocal ground-glass attenuation throughout the lungs bilaterally with scattered areas of septal thickening, thickening of the peribronchovascular interstitium and some rare areas of mild cylindrical bronchiectasis. Peripheral bronchiolectasis and several scattered areas of peripheral honeycombing are noted. These findings have no definitive craniocaudal gradient, and the honeycombing is most  evident in the upper lobes of the lungs bilaterally. Overall, there has been mild progression of disease compared to the prior study 05/10/2017, most notably in terms of the extent of ground-glass attenuation. Inspiratory and expiratory imaging demonstrates moderate air trapping indicative of small airways disease. In the periphery of the left upper lobe there is a enlarging mass-like appearing area which has internal air bronchograms. This is highly irregular in shape and therefore difficult to measure, however, the most focal portion of this is best demonstrated on axial image 55 of series 4 and coronal image 46 of series 7, where this lesion currently measures 2.5 x 2.0 x 3.3 cm. Upper Abdomen: Aortic atherosclerosis.  Status post cholecystectomy. Musculoskeletal: Small sclerotic lesion with narrow zone of transition in the left side of the T3 vertebral body, stable compared to the prior study, most compatible with a small bone island. There are no other larger more aggressive appearing lytic or blastic lesions noted in the visualized portions of the skeleton. IMPRESSION: 1. Persistent evidence of interstitial lung disease with notable progression compared to the prior examination from 05/10/2017. 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 2. Enlarging mass-like area in the periphery of the left upper lobe, currently measuring 2.5 x 2.0 x 3.3 cm, with internal air bronchograms. The possibility of a slow-growing neoplasm such as a primary bronchogenic adenocarcinoma should be considered. Further evaluation with nonemergent PET-CT is recommended in the near future. 3. Aortic atherosclerosis, in addition to left main and 3 vessel coronary artery disease. Assessment for  potential risk factor modification, dietary therapy or pharmacologic therapy may be warranted, if clinically indicated. Aortic Atherosclerosis (ICD10-I70.0). Electronically Signed   By: Vinnie Langton M.D.   On: 05/14/2018 19:55    Chart Review:    Specialty Problems      Pulmonary Problems   INTERSTITIAL LUNG DISEASE    Followed in Pulmonary clinic/ [facility]/ [facility]      -  - PFT's August 23, 2010 VC 82% DLCO 62 > 129 corrected         - Walked 3 laps @ 185 ft each stopped due to end of study, no desats 08/23/10        - Alpha one sent 10/02/2010 >>>  MS genotype, nl levels        Chronic rhinitis     - Sinus CT ordered August 23, 2010 > Pos L sinusitis Rx Avelox x 10 days > repeat ordered 10/02/2010  - Allergy profile sent 08/23/10 > IgE 21 neg profile       ILD (interstitial lung disease) (HCC)   Chronic cough   IPF (idiopathic pulmonary fibrosis) (HCC)      Allergies  Allergen Reactions  . Penicillins Diarrhea    Has patient had a PCN reaction causing immediate rash, facial/tongue/throat swelling, SOB or lightheadedness with hypotension: Unknown Has patient had a PCN reaction causing severe rash involving mucus membranes or skin necrosis: Unknown  Has patient had a PCN reaction that required hospitalization: No  Has patient had a PCN reaction occurring within the last 10 years: No  If all of the above answers are "NO", then may proceed with Cephalosporin use.   . Influenza Vaccines Other (See Comments)    Severe nausea, vomiting and body aches-md told her not to take it anymore  . Clarithromycin     Unknown reaction   . Cortisone Other (See Comments)    "Made her feel like ice water was running through her veins"   . Povidone-Iodine     Unknown reaction     Immunization History  Administered Date(s) Administered  . Influenza Split 05/11/2014    Past Medical History:  Diagnosis Date  . Bronchiectasis    PFTs August 23, 2010 VC 82%   dlco 62 > 129 CORRECTED   . Chronic diarrhea   . Chronic rhinitis    sinus CT ordered August 23, 2010  . Diabetes mellitus ([place])   . Hyperplastic colon polyp   . IBS (irritable bowel syndrome)   . ILD (interstitial lung disease) ([place])   . Stenosis of rectum and anus     Tobacco History: Social History   Tobacco Use  Smoking Status Former Smoker  . Packs/day: 3.00  . Years: 15.00  . Pack years: 45.00  . Types: Cigarettes  . Last attempt to quit: 06/11/1976  . Years since quitting: 41.9  Smokeless Tobacco Never Used   Counseling given: Yes  Continue to not smoke  Outpatient Encounter Medications as of 05/22/2018  Medication Sig  . acetaminophen (TYLENOL) 500 MG tablet Take 1,000 mg by mouth at bedtime.  [name] albuterol (PROVENTIL HFA;VENTOLIN HFA) 108 (90 Base) MCG/ACT inhaler Inhale 2 puffs into the lungs every 6 (six) hours as needed for wheezing or shortness of breath.  . Ascorbic Acid (VITAMIN C) 500 MG tablet Take 500 mg by mouth at bedtime.   [name] BIOTIN PO Take 1 tablet by mouth daily.  . Calcium Carbonate-Vitamin D (CALCIUM 600-D) 600-400 MG-UNIT per tablet Take 1 tablet by mouth at bedtime.   . clorazepate (TRANXENE) 3.75 MG tablet TAKE 1 TABLET BY MOUTH DAILY.MAY TAKE UP TO 2 DAILY IF NEEDED (Patient taking differently: Take 3.75 mg by mouth daily. )  . cyclobenzaprine (FLEXERIL) 10 MG tablet Take 10 mg by mouth at bedtime.   [name] glucosamine-chondroitin 500-400 MG tablet Take 1 tablet by mouth 2 (two) times daily. Triple strength  . levothyroxine (SYNTHROID, LEVOTHROID) 88 MCG tablet Take 88 mcg by mouth daily.    . Multiple Vitamin (MULTIVITAMIN) tablet Take 1 tablet by mouth daily.    . pioglitazone (ACTOS) 15 MG tablet Take 15 mg by mouth daily.  . pravastatin (PRAVACHOL) 20 MG tablet Take 20 mg by mouth every evening.  . verapamil (CALAN) 120 MG tablet Take 60 mg by mouth 2 (two) times daily.   . vitamin B-12 (CYANOCOBALAMIN) 1000 MCG tablet Take 1 tablet (1,000 mcg total) by mouth daily.  [name]  dexlansoprazole (DEXILANT) 60 MG capsule Take one capsule every morning before breakfast (Patient not taking: Reported on 06/20/2017)  . montelukast (SINGULAIR) 10 MG tablet Take one tablet once daily as directed (Patient not taking: Reported on 06/20/2017)  . ranitidine (ZANTAC) 300 MG tablet Take one tablet every evening as directed (Patient not taking: Reported on 05/30/2017)   No facility-administered encounter medications on file as of 05/22/2018.      Review of Systems  Review of Systems  Constitutional: Positive  for fatigue. Negative for chills, fever and unexpected weight change.  HENT: Negative for congestion, ear pain, postnasal drip, sinus pressure and sinus pain.   Respiratory: Positive for cough. Negative for chest tightness, shortness of breath and wheezing.   Cardiovascular: Negative for chest pain and palpitations.  Gastrointestinal: Negative for diarrhea, nausea and vomiting.  Musculoskeletal: Negative for arthralgias.  Skin: Negative for color change.  Allergic/Immunologic: Negative for environmental allergies and food allergies.  Neurological: Negative for dizziness, light-headedness and headaches.  Psychiatric/Behavioral: Negative for dysphoric mood. The patient is not nervous/anxious.   All other systems reviewed and are negative.    Physical Exam  BP 120/72 (BP Location: Right Arm, Cuff Size: Normal)   Pulse 72   Ht 5' 5.5" (1.664 m)   Wt 188 lb (85.3 kg)   SpO2 95%   BMI 30.81 kg/m   Wt Readings from Last 5 Encounters:  05/22/18 188 lb (85.3 kg)  05/13/18 184 lb (83.5 kg)  06/20/17 107 lb (48.5 kg)  05/30/17 185 lb (83.9 kg)  10/23/16 194 lb 6.4 oz (88.2 kg)    Physical Exam  Constitutional: She is oriented to person, place, and time and well-developed, well-nourished, and in no distress. No distress.  HENT:  Head: Normocephalic and atraumatic.  Right Ear: Hearing, tympanic membrane, external ear and ear canal normal.  Left Ear: Hearing, tympanic  membrane, external ear and ear canal normal.  Nose: Mucosal edema present. Right sinus exhibits no maxillary sinus tenderness and no frontal sinus tenderness. Left sinus exhibits no maxillary sinus tenderness and no frontal sinus tenderness.  Mouth/Throat: Uvula is midline and oropharynx is clear and moist. No oropharyngeal exudate.  +healing nasal mucosa, +PND  Eyes: Pupils are equal, round, and reactive to light.  Neck: Normal range of motion. Neck supple.  Cardiovascular: Normal rate, regular rhythm and normal heart sounds.  Pulmonary/Chest: Effort normal. No accessory muscle usage. No respiratory distress. She has no decreased breath sounds. She has no wheezes. She has no rhonchi. She has rales (L > R, BB rales).  Musculoskeletal: Normal range of motion.        General: No edema.     Comments: In wheelchair  Lymphadenopathy:    She has no cervical adenopathy.  Neurological: She is alert and oriented to person, place, and time.  Skin: Skin is warm and dry. She is not diaphoretic.  Psychiatric: Mood, memory, affect and judgment normal.  Nursing note and vitals reviewed.     Lab Results:  CBC    Component Value Date/Time   WBC 5.3 05/14/2018 1016   RBC 3.48 (L) 05/14/2018 1016   HGB 10.6 (L) 05/14/2018 1016   HCT 32.4 (L) 05/14/2018 1016   PLT 322 05/14/2018 1016   MCV 93.1 05/14/2018 1016   MCH 30.5 05/14/2018 1016   MCHC 32.7 05/14/2018 1016   RDW 12.8 05/14/2018 1016   LYMPHSABS 1.8 05/13/2018 2053   MONOABS 0.4 05/13/2018 2053   EOSABS 0.1 05/13/2018 2053   BASOSABS 0.0 05/13/2018 2053    BMET    Component Value Date/Time   NA 137 05/14/2018 1016   K 3.7 05/14/2018 1016   CL 104 05/14/2018 1016   CO2 23 05/14/2018 1016   GLUCOSE 278 (H) 05/14/2018 1016   BUN 10 05/14/2018 1016   CREATININE 0.77 05/14/2018 1016   CALCIUM 8.0 (L) 05/14/2018 1016   GFRNONAA >60 05/14/2018 1016   GFRAA >60 05/14/2018 1016    BNP    Component Value Date/Time  BNP 82.1  05/14/2018 1016    ProBNP No results found for: PROBNP    Assessment & Plan:   Pleasant 77 year old female patient completed hospital follow-up with our office today.  Will have patient return back to our office next month in ILD clinic to see Dr. [name].  Will get lab work today.  Will convert high-res CT to super D chest and I will talk with Dr. Valeta Harms regarding patient's mass to see if the patient is a candidate for EBUS.   Addendum: will order PET scan d/t lung mass on CT   ILD (interstitial lung disease) Labwork today   Complete ILD questionnaire before leaving today   Continue oxygen therapy as prescribed  We will need to get you scheduled with Dr. [name] in ILD clinic as soon as possible     IPF (idiopathic pulmonary fibrosis) ([place]) Most recent CT favoring chronic hypersensitivity pneumonitis  We will repeat autoimmune panel  Patient to complete ILD questionnaire  I do not believe the patient would be a good candidate for anti-fibrotic's as patient continues to struggle with diarrhea  Patient is still against the idea of lung biopsy for further evaluation for IPF  Abnormal finding on lung imaging We will convert high-res CT to super D chest  We will discuss case with Dr. Valeta Harms to see if patient would be a candidate for an EBUS   This appointment was 42 minutes along with over 50% of the time direct face-to-face patient care, plan of care discussion, follow-up  Lauraine Rinne, NP 05/22/2018

## 2018-05-22 ENCOUNTER — Telehealth: Payer: Self-pay | Admitting: Internal Medicine

## 2018-05-22 ENCOUNTER — Ambulatory Visit: Payer: Medicare Other | Admitting: Pulmonary Disease

## 2018-05-22 ENCOUNTER — Encounter: Payer: Self-pay | Admitting: Pulmonary Disease

## 2018-05-22 ENCOUNTER — Telehealth: Payer: Self-pay | Admitting: Cardiovascular Disease

## 2018-05-22 VITALS — BP 120/72 | HR 72 | Ht 65.5 in | Wt 188.0 lb

## 2018-05-22 DIAGNOSIS — J84112 Idiopathic pulmonary fibrosis: Secondary | ICD-10-CM

## 2018-05-22 DIAGNOSIS — J8410 Pulmonary fibrosis, unspecified: Secondary | ICD-10-CM | POA: Diagnosis not present

## 2018-05-22 DIAGNOSIS — J849 Interstitial pulmonary disease, unspecified: Secondary | ICD-10-CM | POA: Diagnosis not present

## 2018-05-22 DIAGNOSIS — R918 Other nonspecific abnormal finding of lung field: Secondary | ICD-10-CM | POA: Diagnosis not present

## 2018-05-22 DIAGNOSIS — R911 Solitary pulmonary nodule: Secondary | ICD-10-CM

## 2018-05-22 NOTE — Telephone Encounter (Signed)
Yes was reading about her last night. Will work her up. Thanks for reaching out.   Aaron Edelman

## 2018-05-22 NOTE — Telephone Encounter (Signed)
Aaron Edelman  You are seeing her 05/22/2018  1 lung mass - needs Super D made of recent CT just in case RB/BI want to do ENB and need  PET scan   2. ILD - [facility] is considering HP. In 2018 HP panel was negativ ebut ANA 1:320. Plan - . Repeat Serum: ESR, ACE, ANA, DS-DNA, RF, anti-CCP,  ANCA screen, MPO, PR-3, Total CK,  Aldolase,  ENP Panel ( ensure includes -> scl-70, ssA, ssB, anti-RNP, anti-JO-1, anti-smith antibody,) , Myositis Panel,   3. Administer ILD questionnaire - you can give this to her and she can fill it in lobby. Later you can include it in notes.

## 2018-05-22 NOTE — Assessment & Plan Note (Signed)
Labwork today   Complete ILD questionnaire before leaving today   Continue oxygen therapy as prescribed  We will need to get you scheduled with Dr. [name] in ILD clinic as soon as possible

## 2018-05-22 NOTE — Assessment & Plan Note (Signed)
We will convert high-res CT to super D chest  We will discuss case with Dr. Valeta Harms to see if patient would be a candidate for an EBUS

## 2018-05-22 NOTE — Assessment & Plan Note (Signed)
Most recent CT favoring chronic hypersensitivity pneumonitis  We will repeat autoimmune panel  Patient to complete ILD questionnaire  I do not believe the patient would be a good candidate for anti-fibrotic's as patient continues to struggle with diarrhea  Patient is still against the idea of lung biopsy for further evaluation for IPF

## 2018-05-22 NOTE — Telephone Encounter (Signed)
Received records from [place] at [facility] on 05/22/18, Appt on 07/18/18 @ 2:00PM.[place]

## 2018-05-22 NOTE — Patient Instructions (Addendum)
Labwork today   Complete ILD questionnaire before leaving today   Continue oxygen therapy as prescribed  We will need to get you scheduled with Dr. [name] in ILD clinic as soon as possible      It is flu season:   >>>Remember to be washing your hands regularly, using hand sanitizer, be careful to use around herself with has contact with people who are sick will increase her chances of getting sick yourself. >>> Best ways to protect herself from the flu: Receive the yearly flu vaccine, practice good hand hygiene washing with soap and also using hand sanitizer when available, eat a nutritious meals, get adequate rest, hydrate appropriately   Please contact the office if your symptoms worsen or you have concerns that you are not improving.   Thank you for choosing  [facility] for your healthcare, and for allowing [place] to partner with you on your healthcare journey. I am thankful to be able to provide care to you today.   Wyn Quaker FNP-C

## 2018-05-23 ENCOUNTER — Telehealth: Payer: Self-pay | Admitting: Pulmonary Disease

## 2018-05-23 LAB — SEDIMENTATION RATE: Sed Rate: 53 mm/hr — ABNORMAL HIGH (ref 0–30)

## 2018-05-23 NOTE — Addendum Note (Signed)
Addended by: Lauraine Rinne on: 05/23/2018 04:10 PM   Modules accepted: Orders

## 2018-05-23 NOTE — Telephone Encounter (Signed)
Called and spoke with patient, she said she would wait for a call to schedule the PET scan.   Nothing further needed at this time.

## 2018-05-23 NOTE — Telephone Encounter (Signed)
05/23/18 1610  Can we please contact the patient let them know that I have ordered a PET scan to follow the lung mass found on her CT.  I hope to have this completed within the next week.   Wyn Quaker FNP

## 2018-05-23 NOTE — Telephone Encounter (Signed)
05/23/18 0915  [facility] Integrated ILD questionnaire  Symptoms: Shortness of breath began gradually.  Patient has had shortness of breath for the last 12 years but has worsened over the last 3 to 4 weeks.  Cough started 11 to 12 years ago and it is still the same.  It is a moderate cough.  Do not cough at night.  To bring up phlegm.  Phlegm color is clear. Past medical history: Type 2 diabetes.  Thyroid disease.  One episode of pneumonia.  One episode of pleurisy Review of symptoms: Patient endorses: Fatigue, occasional joint stiffness, nausea, heartburn Family history: Patient denies any family history to pulmonary fibrosis, COPD, asthma, sarcoidosis, cystic fibrosis, hypersensitivity pneumonitis, autoimmune diseases. Exposure history: Former tobacco smoker quit 1977.  45-pack-year smoking history.  Denies cigar, pipe, vaping.  Denies non-tobacco use or illicit drug use. Home and hobby details: Single-family home.  [place].  Have lived at home since 1975.  Age of home is 40.5 years.  This is not a damp living environment.  There is multi-mildew in shower curtain.  Bathroom does have mold and mildew.  Patient does not use humidifier or CPAP or nebulizer.  Patient does not seem iron.  Patient does not have a Jacuzzi or misting found.  Patient did have a pet parakeet as a teenager.  Patient does not have any pet gerbils hamsters rabbits or rodents in the house.  Patient does have feather pillows but there are 3 pillows not to sleep on.  There is not mold in her [facility] duct system.  She does not play any wind instruments.  Patient does like to garden (flowers).  Patient does have mulch in her outdoor gardens.  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  Patient had a parakeet as a teenager.  Patient patient has a feather duvet.  Patient does paint.  Patient sews.  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  Patient has cats and dogs at home. Medication history: Patient denies any medication exposures.     Physical copy of ILD questionnaire handed to [facility], CMA to be put in Dr. [name] look at folder.  Will route to Dr. [name] as an Juluis Rainier.    Wyn Quaker FNP

## 2018-05-26 LAB — ANA: Anti Nuclear Antibody(ANA): NEGATIVE

## 2018-05-26 LAB — CYCLIC CITRUL PEPTIDE ANTIBODY, IGG: Cyclic Citrullin Peptide Ab: <16 UNITS

## 2018-05-26 LAB — MPO/PR-3 (ANCA) ANTIBODIES
Myeloperoxidase Abs: <1 AI
Serine Protease 3: <1 AI

## 2018-05-26 LAB — ANTI-DNA ANTIBODY, DOUBLE-STRANDED: ds DNA Ab: 1 IU/mL

## 2018-05-26 LAB — SJOGREN'S SYNDROME ANTIBODS(SSA + SSB)
SSA (Ro) (ENA) Antibody, IgG: <1 AI
SSB (La) (ENA) Antibody, IgG: <1 AI

## 2018-05-26 LAB — RHEUMATOID FACTOR: Rheumatoid fact SerPl-aCnc: 32 IU/mL — ABNORMAL HIGH (ref <14)

## 2018-05-26 LAB — ANTI-SMITH ANTIBODY: ENA SM AB SER-ACNC: NEGATIVE AI

## 2018-05-26 LAB — CK TOTAL AND CKMB (NOT AT ARMC)
CK TOTAL: 23 U/L — AB (ref 29–143)
CK, MB: <0.7 ng/mL (ref 0–5.0)

## 2018-05-26 LAB — ANTI-SCLERODERMA ANTIBODY: Scleroderma (Scl-70) (ENA) Antibody, IgG: <1 AI

## 2018-05-26 LAB — ANCA SCREEN W REFLEX TITER

## 2018-05-26 LAB — ANGIOTENSIN CONVERTING ENZYME: Angiotensin-Converting Enzyme: 36 U/L (ref 9–67)

## 2018-05-26 LAB — ALDOLASE: Aldolase: 3.5 U/L (ref <=8.1)

## 2018-05-27 ENCOUNTER — Telehealth: Payer: Self-pay | Admitting: Internal Medicine

## 2018-05-27 NOTE — Progress Notes (Signed)
Lab work has been stable. Minor elevation in Rh factor and sed rate. We can monitor these in the future. Keep scheduled appt for PET scan and with Dr. [name] In jan 2020.   No further changes in plan of care at this time  [place]

## 2018-05-27 NOTE — Telephone Encounter (Signed)
Yes patient does have ILD, it does not appear to be driven by an autoimmune process.   Wyn Quaker FNP

## 2018-05-27 NOTE — Telephone Encounter (Signed)
Called and spoke with pt letting her know the information stated by Aaron Edelman. Pt expressed understanding.nothing further needed.

## 2018-05-27 NOTE — Telephone Encounter (Signed)
Notes recorded by Lauraine Rinne, NP on 05/27/2018 at 9:05 AM EST Lab work has been stable. Minor elevation in Rh factor and sed rate. We can monitor these in the future. Keep scheduled appt for PET scan and with Dr. [name] In jan 2020.   No further changes in plan of care at this time  [place] and spoke with pt in regards to [facility] and also that Aaron Edelman stated for her to keep PET scan appt as well as appt with MR. Pt wanted to have some more clarifications in regards to her labwork, especially with the labs that showed some minor elevation. Pt wants to know if she indeed does have ILD based on the results of the labwork.  Pt would like a call back in regards to this. Aaron Edelman, please advise for pt.thanks!

## 2018-06-02 ENCOUNTER — Telehealth: Payer: Self-pay | Admitting: Internal Medicine

## 2018-06-02 LAB — MYOSITIS PANEL III: RNP: 50.1 EU/ml — AB

## 2018-06-02 LAB — RNP ANTIBODIES: ENA RNP Ab: <0.2 AI (ref 0.0–0.9)

## 2018-06-02 LAB — ANTI-JO 1 ANTIBODY, IGG: Anti JO-1: <0.2 AI (ref 0.0–0.9)

## 2018-06-02 NOTE — Telephone Encounter (Signed)
Called and spoke with Patient.  Patient was questioning which lobe of her lung, Aaron Edelman was concerned with, from CT scan.  According to CT lung, 05/14/18, left upper lobe. Patient stated understanding.  Nothing further at this time.

## 2018-06-05 ENCOUNTER — Encounter (HOSPITAL_COMMUNITY)
Admission: RE | Admit: 2018-06-05 | Discharge: 2018-06-05 | Disposition: A | Discharge status: Home / Self Care | Payer: Medicare Other | Source: Ambulatory Visit | Attending: Pulmonary Disease | Admitting: Pulmonary Disease

## 2018-06-05 ENCOUNTER — Encounter (HOSPITAL_COMMUNITY): Payer: Medicare Other

## 2018-06-05 DIAGNOSIS — R911 Solitary pulmonary nodule: Secondary | ICD-10-CM | POA: Diagnosis not present

## 2018-06-05 DIAGNOSIS — R918 Other nonspecific abnormal finding of lung field: Secondary | ICD-10-CM | POA: Insufficient documentation

## 2018-06-05 LAB — GLUCOSE, CAPILLARY: Glucose-Capillary: 137 mg/dL — ABNORMAL HIGH (ref 70–99)

## 2018-06-05 MED ORDER — FLUDEOXYGLUCOSE F - 18 (FDG) INJECTION
9.4000 | Freq: Once | INTRAVENOUS | Status: AC
  Administered 2018-06-05: 9.4 via INTRAVENOUS

## 2018-06-06 ENCOUNTER — Other Ambulatory Visit: Payer: Self-pay

## 2018-06-06 ENCOUNTER — Telehealth: Payer: Self-pay | Admitting: Pulmonary Disease

## 2018-06-06 DIAGNOSIS — R918 Other nonspecific abnormal finding of lung field: Secondary | ICD-10-CM

## 2018-06-06 NOTE — Progress Notes (Signed)
Patient was contacted.  See telephone note from 06/06/2018.  Patient to have scheduled follow-up with Dr. Valeta Harms on January/02/2019.  Patient to plan to have a EBUS bronchoscopy procedure on January/15/19.  Patient to keep scheduled follow-up with Dr. [name] towards the end of January as planned.  All questions for patient were answered.  Wyn Quaker, FNP

## 2018-06-06 NOTE — Progress Notes (Signed)
Order placed for EBUS. Nothing further is needed at this time.

## 2018-06-06 NOTE — Telephone Encounter (Signed)
06/06/18 1102  Patient was contacted I discussed PET scan results with patient.  Patient scheduled for 06/19/2018 appointment at 1415 with Dr. Valeta Harms to discuss potential EBUS on 06/25/2018.   Patient instructed to present to our office on 06/19/2018 at 1400 15 minutes prior to her office visit with Dr. Valeta Harms.  Patient also instructed to keep 06/25/2018 scheduled clear as she will most likely have an EBUS on that date.  Patient instructed to keep scheduled follow-up with Dr. [name] in January/2020 to review EBUS as well as further follow-up on pulmonary needs as patient has not seen Dr. [name] in quite some time.  I explained to the patient that another procedure that we will discuss with her is the envista test to be completed while Dr. Valeta Harms is doing the EBUS.  I have coordinated this with Dr. [name] and Dr. Valeta Harms.  The first envista test is being completed January/13/2020.  Patient reports she still has a persistent dry cough.  Patient wants to know why this is happening.  Explained to the patient that she has interstitial lung disease (IPF versus HP), bronchiectasis on CT, and mucosal thickening of left sinuses all of which could be contributing to her persistent cough.  Patient reports frustration as she wants more specific diagnosis regarding interstitial lung disease.  I explained to the patient that performing the envista test is the best option to help differentiate between the interstitial lung disease patterns that we are seeing.  Will route to Dr. Valeta Harms, Dr. [name], and [place] LPN as Juluis Rainier.  Wyn Quaker FNP

## 2018-06-19 ENCOUNTER — Ambulatory Visit: Payer: Medicare Other | Admitting: Pulmonary Disease

## 2018-06-19 ENCOUNTER — Encounter: Payer: Self-pay | Admitting: Pulmonary Disease

## 2018-06-19 VITALS — BP 128/76 | HR 86 | Wt 186.4 lb

## 2018-06-19 DIAGNOSIS — R590 Localized enlarged lymph nodes: Secondary | ICD-10-CM

## 2018-06-19 DIAGNOSIS — R918 Other nonspecific abnormal finding of lung field: Secondary | ICD-10-CM | POA: Diagnosis not present

## 2018-06-19 DIAGNOSIS — R942 Abnormal results of pulmonary function studies: Secondary | ICD-10-CM | POA: Diagnosis not present

## 2018-06-19 DIAGNOSIS — J849 Interstitial pulmonary disease, unspecified: Secondary | ICD-10-CM

## 2018-06-19 NOTE — Patient Instructions (Addendum)
Thank you for visiting Dr. Valeta Harms at [facility].  Today we recommend the following:  Your procedure is 06/25/2018 at  [facility] at 8:15am. You will need to arrive by 7am. Go to admitting and check in. Nothing to eat after midnight. Pre-op will call you with any other directions.   Return in about 4 weeks (around 07/17/2018), or if symptoms worsen or fail to improve.

## 2018-06-19 NOTE — Pre-Procedure Instructions (Signed)
[name]  06/19/2018      [id number] [name], [place] [address] Phone: [phone] Fax: [phone]    Your procedure is scheduled on Wednesday January 15th.  Report to [facility] Admitting at [place].  Call this number if you have problems the morning of surgery:  [phone]   Remember:  Do not eat or drink after midnight.    Take these medicines the morning of surgery with A SIP OF WATER  albuterol (PROVENTIL HFA;VENTOLIN HFA) if needed. Bring inhaler with you to surgery clorazepate (TRANXENE) if needed levothyroxine (SYNTHROID, LEVOTHROID)   7 days prior to surgery STOP taking any Aspirin (unless otherwise instructed by your surgeon), Aleve, Naproxen, Ibuprofen, Motrin, Advil, Goody's, BC's, all herbal medications, fish oil, and all vitamins.   WHAT DO I DO ABOUT MY DIABETES MEDICATION?   [name] Do not take oral diabetes medicines (pills) the morning of surgery: pioglitazone (ACTOS).  . The day of surgery, do not take other diabetes injectables, including Byetta (exenatide), Bydureon (exenatide ER), Victoza (liraglutide), or Trulicity (dulaglutide).  . If your CBG is greater than 220 mg/dL, you may take  of your sliding scale (correction) dose of insulin.   How to Manage Your Diabetes Before and After Surgery  Why is it important to control my blood sugar before and after surgery? . Improving blood sugar levels before and after surgery helps healing and can limit problems. . A way of improving blood sugar control is eating a healthy diet by: o  Eating less sugar and carbohydrates o  Increasing activity/exercise o  Talking with your doctor about reaching your blood sugar goals . High blood sugars (greater than 180 mg/dL) can raise your risk of infections and slow your recovery, so you will need to focus on controlling your diabetes during the weeks before surgery. . Make sure that the doctor who  takes care of your diabetes knows about your planned surgery including the date and location.  How do I manage my blood sugar before surgery? . Check your blood sugar at least 4 times a day, starting 2 days before surgery, to make sure that the level is not too high or low. o Check your blood sugar the morning of your surgery when you wake up and every 2 hours until you get to the Short Stay unit. . If your blood sugar is less than 70 mg/dL, you will need to treat for low blood sugar: o Do not take insulin. o Treat a low blood sugar (less than 70 mg/dL) with  cup of clear juice (cranberry or apple), 4 glucose tablets, OR glucose gel. o Recheck blood sugar in 15 minutes after treatment (to make sure it is greater than 70 mg/dL). If your blood sugar is not greater than 70 mg/dL on recheck, call [phone] for further instructions. . Report your blood sugar to the short stay nurse when you get to Short Stay.  . If you are admitted to the hospital after surgery: o Your blood sugar will be checked by the staff and you will probably be given insulin after surgery (instead of oral diabetes medicines) to make sure you have good blood sugar levels. o The goal for blood sugar control after surgery is 80-180 mg/dL.    Do not wear jewelry, make-up or nail polish.  Do not wear lotions, powders, or perfumes, or deodorant.  Do not shave 48 hours prior to surgery.  Men may shave face and neck.  Do not bring valuables to the hospital.  [facility] is not responsible for any belongings or valuables.  Contacts, dentures or bridgework may not be worn into surgery.  Leave your suitcase in the car.  After surgery it may be brought to your room.  For patients admitted to the hospital, discharge time will be determined by your treatment team.  Patients discharged the day of surgery will not be allowed to drive home.    [place]- Preparing For Surgery  Before surgery, you can play an important role.  Because skin is not sterile, your skin needs to be as free of germs as possible. You can reduce the number of germs on your skin by washing with CHG (chlorahexidine gluconate) Soap before surgery.  CHG is an antiseptic cleaner which kills germs and bonds with the skin to continue killing germs even after washing.    Oral Hygiene is also important to reduce your risk of infection.  Remember - BRUSH YOUR TEETH THE MORNING OF SURGERY WITH YOUR REGULAR TOOTHPASTE  Please do not use if you have an allergy to CHG or antibacterial soaps. If your skin becomes reddened/irritated stop using the CHG.  Do not shave (including legs and underarms) for at least 48 hours prior to first CHG shower. It is OK to shave your face.  Please follow these instructions carefully.   1. Shower the NIGHT BEFORE SURGERY and the MORNING OF SURGERY with CHG.   2. If you chose to wash your hair, wash your hair first as usual with your normal shampoo.  3. After you shampoo, rinse your hair and body thoroughly to remove the shampoo.  4. Use CHG as you would any other liquid soap. You can apply CHG directly to the skin and wash gently with a scrungie or a clean washcloth.   5. Apply the CHG Soap to your body ONLY FROM THE NECK DOWN.  Do not use on open wounds or open sores. Avoid contact with your eyes, ears, mouth and genitals (private parts). Wash Face and genitals (private parts)  with your normal soap.  6. Wash thoroughly, paying special attention to the area where your surgery will be performed.  7. Thoroughly rinse your body with warm water from the neck down.  8. DO NOT shower/wash with your normal soap after using and rinsing off the CHG Soap.  9. Pat yourself dry with a CLEAN TOWEL.  10. Wear CLEAN PAJAMAS to bed the night before surgery, wear comfortable clothes the morning of surgery  11. Place CLEAN SHEETS on your bed the night of your first shower and DO NOT SLEEP WITH PETS.    Day of Surgery:  Do not  apply any deodorants/lotions.  Please wear clean clothes to the hospital/surgery center.   Remember to brush your teeth WITH YOUR REGULAR TOOTHPASTE.    Please read over the following fact sheets that you were given.

## 2018-06-19 NOTE — Progress Notes (Signed)
Synopsis: Referred in January 2020 for PET avid left upper lobe lung mass by Dr. [name].  Subjective:   PATIENT ID: [name] GENDER: female DOB: 1940/06/28, MRN: [id number]  Chief Complaint  Patient presents with  . Follow-up    Patient needs to discuss Bronch.     Ms. Stolp is a 78 year old female with a past medical history of interstitial lung disease.  Most recent CT imaging with significant traction bronchiectasis and parenchymal destruction.  She has not had a clear etiology defined for her ILD.  She does complain of significant persistent cough.  Patient is very frustrated about her cough.  She does not feel like anybody has ever tried to make it any better.  She is also very confused as to why she is even seeing me today.  She understands that she has had imaging and she does not really understand why she has had this imaging done.  She is very upset that she is not sure we have been following her lung disease appropriately.  I reassured her that she has seen multiple providers and has been followed closely and appropriately by Dr. [name].  He has been following her and has seen the enlarging nodule in her significantly diseased lung and has referred her to see me to discuss biopsy.  She does not understand why she needs a biopsy.  I reviewed her PET images today with her in the office.  And explained that I am concerned she may have a cancer.   Past Medical History:  Diagnosis Date  . Bronchiectasis    PFTs August 23, 2010 VC 82%   dlco 62 > 129 CORRECTED  . Chronic diarrhea   . Chronic rhinitis    sinus CT ordered August 23, 2010  . Diabetes mellitus ([place])   . Hyperplastic colon polyp   . IBS (irritable bowel syndrome)   . ILD (interstitial lung disease) ([place])   . Stenosis of rectum and anus      Family History  Problem Relation Age of Onset  . Colon polyps Sister   . Diabetes Sister   . Heart disease Mother   . Colon cancer Neg Hx      Past Surgical  History:  Procedure Laterality Date  . CHOLECYSTECTOMY    . DILATION AND CURETTAGE OF UTERUS    . HAND SURGERY  1991   left hand; Baker's Cyst removed  . migraine    . tear duct surgery      Social History   Socioeconomic History  . Marital status: Married    Spouse name: Not on file  . Number of children: Not on file  . Years of education: Not on file  . Highest education level: Not on file  Occupational History  . Occupation: retired  Scientific laboratory technician  . Financial resource strain: Not on file  . Food insecurity:    Worry: Not on file    Inability: Not on file  . Transportation needs:    Medical: Not on file    Non-medical: Not on file  Tobacco Use  . Smoking status: Former Smoker    Packs/day: 3.00    Years: 15.00    Pack years: 45.00    Types: Cigarettes    Last attempt to quit: 06/11/1976    Years since quitting: 42.0  . Smokeless tobacco: Never Used  Substance and Sexual Activity  . Alcohol use: No  . Drug use: No  . Sexual activity: Not on file  Lifestyle  . Physical activity:    Days per week: Not on file    Minutes per session: Not on file  . Stress: Not on file  Relationships  . Social connections:    Talks on phone: Not on file    Gets together: Not on file    Attends religious service: Not on file    Active member of club or organization: Not on file    Attends meetings of clubs or organizations: Not on file    Relationship status: Not on file  . Intimate partner violence:    Fear of current or ex partner: Not on file    Emotionally abused: Not on file    Physically abused: Not on file    Forced sexual activity: Not on file  Other Topics Concern  . Not on file  Social History Narrative   Daily caffeine use     Allergies  Allergen Reactions  . Penicillins Diarrhea    Has patient had a PCN reaction causing immediate rash, facial/tongue/throat swelling, SOB or lightheadedness with hypotension: Unknown Has patient had a PCN reaction causing severe  rash involving mucus membranes or skin necrosis: Unknown Has patient had a PCN reaction that required hospitalization: No  Has patient had a PCN reaction occurring within the last 10 years: No  If all of the above answers are "NO", then may proceed with Cephalosporin use.   . Influenza Vaccines Other (See Comments)    Severe nausea, vomiting and body aches-md told her not to take it anymore  . Clarithromycin     Unknown reaction   . Cortisone Other (See Comments)    "Made her feel like ice water was running through her veins"   . Povidone-Iodine     Unknown reaction      Outpatient Medications Prior to Visit  Medication Sig Dispense Refill  . acetaminophen (TYLENOL) 500 MG tablet Take 1,000 mg by mouth at bedtime.    [name] albuterol (PROVENTIL HFA;VENTOLIN HFA) 108 (90 Base) MCG/ACT inhaler Inhale 2 puffs into the lungs every 6 (six) hours as needed for wheezing or shortness of breath. 1 Inhaler 2  . Ascorbic Acid (VITAMIN C) 1000 MG tablet Take 1,000 mg by mouth at bedtime.     . Biotin 10000 MCG TABS Take 10,000 mcg by mouth daily.     . Calcium-Magnesium-Vitamin D (CALCIUM 1200+D3 PO) Take 1 tablet by mouth at bedtime.    . clorazepate (TRANXENE) 3.75 MG tablet TAKE 1 TABLET BY MOUTH DAILY.MAY TAKE UP TO 2 DAILY IF NEEDED (Patient taking differently: Take 3.75 mg by mouth daily. ) 60 tablet 1  . cyclobenzaprine (FLEXERIL) 10 MG tablet Take 10 mg by mouth at bedtime.     . Gluc-Chonn-MSM-Boswellia-Vit D (GLUCOSAMINE CHOND TRIPLE/VIT D) TABS Take 1 tablet by mouth 2 (two) times daily.    [name] levothyroxine (SYNTHROID, LEVOTHROID) 88 MCG tablet Take 88 mcg by mouth daily before breakfast.     . Multiple Vitamin (MULTIVITAMIN) tablet Take 1 tablet by mouth daily.      . pioglitazone (ACTOS) 15 MG tablet Take 15 mg by mouth daily.    . pravastatin (PRAVACHOL) 20 MG tablet Take 20 mg by mouth every evening.  1  . verapamil (CALAN) 120 MG tablet Take 60 mg by mouth 2 (two) times daily.     [name]  dexlansoprazole (DEXILANT) 60 MG capsule Take one capsule every morning before breakfast (Patient not taking: Reported on 06/19/2018) 30 capsule 5  . montelukast (  SINGULAIR) 10 MG tablet Take one tablet once daily as directed (Patient not taking: Reported on 06/19/2018) 30 tablet 5  . ranitidine (ZANTAC) 300 MG tablet Take one tablet every evening as directed (Patient not taking: Reported on 06/19/2018) 30 tablet 5   No facility-administered medications prior to visit.     Review of Systems  Constitutional: Negative for chills, fever, malaise/fatigue and weight loss.  HENT: Negative for hearing loss, sore throat and tinnitus.   Eyes: Negative for blurred vision and double vision.  Respiratory: Positive for cough, sputum production and shortness of breath. Negative for hemoptysis, wheezing and stridor.   Cardiovascular: Negative for chest pain, palpitations, orthopnea, leg swelling and PND.  Gastrointestinal: Negative for abdominal pain, constipation, diarrhea, heartburn, nausea and vomiting.  Genitourinary: Negative for dysuria, hematuria and urgency.  Musculoskeletal: Negative for joint pain and myalgias.  Skin: Negative for itching and rash.  Neurological: Negative for dizziness, tingling, weakness and headaches.  Endo/Heme/Allergies: Negative for environmental allergies. Does not bruise/bleed easily.  Psychiatric/Behavioral: Negative for depression. The patient is not nervous/anxious and does not have insomnia.   All other systems reviewed and are negative.    Objective:  Physical Exam Vitals signs reviewed.  Constitutional:      General: She is not in acute distress.    Appearance: She is well-developed.  HENT:     Head: Normocephalic and atraumatic.  Eyes:     General: No scleral icterus.    Conjunctiva/sclera: Conjunctivae normal.     Pupils: Pupils are equal, round, and reactive to light.  Neck:     Musculoskeletal: Neck supple.     Vascular: No JVD.     Trachea: No tracheal  deviation.  Cardiovascular:     Rate and Rhythm: Normal rate and regular rhythm.     Heart sounds: Normal heart sounds. No murmur.  Pulmonary:     Effort: Pulmonary effort is normal. No tachypnea, accessory muscle usage or respiratory distress.     Breath sounds: No stridor. No wheezing, rhonchi or rales.     Comments: Bilateral crackles Abdominal:     General: Bowel sounds are normal. There is no distension.     Palpations: Abdomen is soft.     Tenderness: There is no abdominal tenderness.  Musculoskeletal:        General: No tenderness.  Lymphadenopathy:     Cervical: No cervical adenopathy.  Skin:    General: Skin is warm and dry.     Capillary Refill: Capillary refill takes less than 2 seconds.     Findings: No rash.  Neurological:     Mental Status: She is alert and oriented to person, place, and time.  Psychiatric:        Behavior: Behavior normal.      Vitals:   06/19/18 1423  BP: 128/76  Pulse: 86  SpO2: 99%  Weight: 186 lb 6.4 oz (84.6 kg)   99% on RA BMI Readings from Last 3 Encounters:  06/19/18 30.55 kg/m  05/22/18 30.81 kg/m  05/13/18 30.15 kg/m   Wt Readings from Last 3 Encounters:  06/19/18 186 lb 6.4 oz (84.6 kg)  05/22/18 188 lb (85.3 kg)  05/13/18 184 lb (83.5 kg)     CBC    Component Value Date/Time   WBC 5.3 05/14/2018 1016   RBC 3.48 (L) 05/14/2018 1016   HGB 10.6 (L) 05/14/2018 1016   HCT 32.4 (L) 05/14/2018 1016   PLT 322 05/14/2018 1016   MCV 93.1 05/14/2018 1016  MCH 30.5 05/14/2018 1016   MCHC 32.7 05/14/2018 1016   RDW 12.8 05/14/2018 1016   LYMPHSABS 1.8 05/13/2018 2053   MONOABS 0.4 05/13/2018 2053   EOSABS 0.1 05/13/2018 2053   BASOSABS 0.0 05/13/2018 2053    Chest Imaging: 06/05/2018: Nuclear medicine PET/CT SUV 14 left upper lobe nodule with mediastinal adenopathy and associated PET positivity. Overall concerning for a bronchogenic carcinoma with potential mediastinal spread. The patient's images have been  independently reviewed by me.    Pulmonary Functions Testing Results: PFT Results Latest Ref Rng & Units 07/15/2014 10/30/2013  FVC-Pre L 2.44 2.41  FVC-Predicted Pre % 81 79  FVC-Post L 2.46 2.40  FVC-Predicted Post % 81 79  Pre FEV1/FVC % % 87 85  Post FEV1/FCV % % 88 87  FEV1-Pre L 2.13 2.05  FEV1-Predicted Pre % 94 89  FEV1-Post L 2.17 2.10  DLCO UNC% % 60 61  DLCO COR %Predicted % 90 91  TLC L 3.68 3.81  TLC % Predicted % 70 73  RV % Predicted % 52 51     FeNO: None   Pathology: None   Echocardiogram: None   Heart Catheterization: None     Assessment & Plan:   Mass of upper lobe of left lung  Mediastinal adenopathy  ILD (interstitial lung disease) (HCC)  Abnormal PET of left lung  Discussion:  This is a 78 year old female with a history of chronic interstitial lung disease with significant parenchymal involvement areas of traction bronchiectasis and honeycombing.  She has been followed by her interstitial lung disease clinic.  In the past she has declined open lung biopsy for further evaluation per documentation.  On subsequent serial imaging of the chest she has been found to have a enlarging left upper lobe lung mass now with some associated small mediastinal adenopathy concerning for a primary bronchogenic carcinoma due to had significant PET avid uptake.  We will recommend the following:  Today in the office we discussed the risks, benefits and alternatives of proceeding with bronchoscopy for further evaluation of the mediastinal adenopathy as well as the lung lesion.  I do believe that she is very high risk for navigational bronchoscopy due to the significant parenchymal involvement of her ILD.  She has very poor lung compliance and would be at risk for the development of pneumothorax.  She was counseled upon the risk of development of pneumothorax as well as the potential need for hospitalization following a bronchoscopy like this in a patient with significant  lung disease.  Patient states that she is unsure that she wants to do anything about this.  And she would like to think about it until tomorrow and make her decision.  She states that she will call us if she decides she wants to undergo bronchoscopy for further evaluation.  I did inform her that if in fact she does have a cancer and it has spread to the mediastinum and we choose to do nothing at all regarding this then she would likely pass away from advancement of this underlying disease.  If she does decide that she does not want to pursue bronchoscopy she may consider percutaneous needle biopsy however when this was presented to her as well she was unsure of her willingness to do this.  At this point she currently is scheduled for a bronchoscopy next Wednesday.  If she is not willing to proceed we will need to cancel this procedure.  I discussed this with the patient's primary pulmonologist Dr. [name] today.  Greater than 50% of this patient's 40-minute office was spent face-to-face discussing the recommendation treatment plan as well as going over the risk benefits as described above.   Current Outpatient Medications:  .  acetaminophen (TYLENOL) 500 MG tablet, Take 1,000 mg by mouth at bedtime., Disp: , Rfl:  .  albuterol (PROVENTIL HFA;VENTOLIN HFA) 108 (90 Base) MCG/ACT inhaler, Inhale 2 puffs into the lungs every 6 (six) hours as needed for wheezing or shortness of breath., Disp: 1 Inhaler, Rfl: 2 .  Ascorbic Acid (VITAMIN C) 1000 MG tablet, Take 1,000 mg by mouth at bedtime. , Disp: , Rfl:  .  Biotin 10000 MCG TABS, Take 10,000 mcg by mouth daily. , Disp: , Rfl:  .  Calcium-Magnesium-Vitamin D (CALCIUM 1200+D3 PO), Take 1 tablet by mouth at bedtime., Disp: , Rfl:  .  clorazepate (TRANXENE) 3.75 MG tablet, TAKE 1 TABLET BY MOUTH DAILY.MAY TAKE UP TO 2 DAILY IF NEEDED (Patient taking differently: Take 3.75 mg by mouth daily. ), Disp: 60 tablet, Rfl: 1 .  cyclobenzaprine (FLEXERIL) 10  MG tablet, Take 10 mg by mouth at bedtime. , Disp: , Rfl:  .  Gluc-Chonn-MSM-Boswellia-Vit D (GLUCOSAMINE CHOND TRIPLE/VIT D) TABS, Take 1 tablet by mouth 2 (two) times daily., Disp: , Rfl:  .  levothyroxine (SYNTHROID, LEVOTHROID) 88 MCG tablet, Take 88 mcg by mouth daily before breakfast. , Disp: , Rfl:  .  Multiple Vitamin (MULTIVITAMIN) tablet, Take 1 tablet by mouth daily.  , Disp: , Rfl:  .  pioglitazone (ACTOS) 15 MG tablet, Take 15 mg by mouth daily., Disp: , Rfl:  .  pravastatin (PRAVACHOL) 20 MG tablet, Take 20 mg by mouth every evening., Disp: , Rfl: 1 .  verapamil (CALAN) 120 MG tablet, Take 60 mg by mouth 2 (two) times daily. , Disp: , Rfl:  .  dexlansoprazole (DEXILANT) 60 MG capsule, Take one capsule every morning before breakfast (Patient not taking: Reported on 06/19/2018), Disp: 30 capsule, Rfl: 5   Garner Nash, DO [facility] 06/19/2018 3:14 PM

## 2018-06-20 ENCOUNTER — Inpatient Hospital Stay (HOSPITAL_COMMUNITY)
Admission: RE | Admit: 2018-06-20 | Discharge: 2018-06-20 | Disposition: A | Discharge status: Home / Self Care | Payer: Medicare Other | Source: Ambulatory Visit

## 2018-06-20 ENCOUNTER — Telehealth: Payer: Self-pay | Admitting: Pulmonary Disease

## 2018-06-20 NOTE — Telephone Encounter (Signed)
Patient was seen by Dr. Valeta Harms on 06/19/2018.    We will close this note.  Sylvia Quaker, FNP

## 2018-06-20 NOTE — Telephone Encounter (Signed)
Called and spoke with pt letting her know this information. Pt stated to me she does not want to cancel the current scheduled procedure date of 06/25/2018 but does want [place] to see if we can rearrange BI's schedule on either 2/5 or 2/12 and if we are able to do so, and if we can get the date of her procedure changed to one of those dates to call and let her know. Otherwise, pt stated if we were not able to rearrange the schedules, she will keep her current scheduled procedure date.

## 2018-06-20 NOTE — Telephone Encounter (Signed)
PCCM:  If the patient is unwilling for her current scheduled date; then, my next available date would be Feb. 5th or maybe 12th pending re-scheduling of any clinic patients in the morning. Maybe Dr. Lamonte Sakai would consider doing her procedure, ENB/EBUS (ILD patient of Dr. [name]). I believe at minimum and EBUS could be scheduled with multiple providers for mediastinal sampling which may be enough for diagnosis.   CC: Rob Rod Holler, DO [facility] 06/20/2018 4:06 PM

## 2018-06-20 NOTE — Telephone Encounter (Signed)
Sending to Triage the doctor will have to give some more dates

## 2018-06-20 NOTE — Telephone Encounter (Signed)
Agree with what stated above.  I would encourage the patient to keep already scheduled date.Wyn Quaker, FNP

## 2018-06-20 NOTE — Telephone Encounter (Signed)
Pt's current procedure date is 06/25/2018.    Called and spoke with pt who stated that she has a lot going on right now and is wanting to know if the date for her procedure can be changed.   Pt stated due to pt having family out of town and with what is going on, she wants to know if the procedure could be postponed until the following week instead of having this done 06/25/2018. Pt stated she will keep the current scheduled date if it could not be changed but she wanted Dr. Valeta Harms to review this to see if he was fine with it being postponed until the week after, and if so, what days would work with him for this to be done.  Dr. Valeta Harms please advise on this for pt. Thanks!

## 2018-06-23 NOTE — Telephone Encounter (Signed)
LMTCB. Patient needs to be made aware this procedure is important and it is in her best interest to keep the scheduled date. As stated in the previous message she would be willing to keep the appt date for the 15th so we will keep that appt moving forward. Will await patient call back.

## 2018-06-23 NOTE — Telephone Encounter (Signed)
PCCM:  Unfortunately, we are not able to accommodate. I am currently scheduled for procedures to care for other patients during my next available procedure times. It is not simple to just change dates.   Therefore, the patient is aware of potential for disease progression with any delay. She was informed of this during her office visit with me.   I advise that she needs to meet with her primary pulmonologist Dr. [name] to discuss how they would like to proceed.   If she would like to meet another physician in the practice that offers this procedure we can arrange this. (Drs Lamonte Sakai or Ander Slade can offer ENB/EBUS).   If she would like a referral outside of the [facility] practice to meet with Cardiothoracic Surgery we can arrange this as well. Thirdly, she can be referred outside the [facility] system to [facility] or [facility].  [facility] 06/23/2018 10:37 AM

## 2018-06-23 NOTE — Telephone Encounter (Signed)
Pt was scheduled for a sooner appt than 07/17/2018 with MR 07/01/2018. Nothing further needed.

## 2018-06-23 NOTE — Telephone Encounter (Signed)
Spoke with patient. She is aware of BI's respond and is still not willing to have the procedure on the 15th. She is demanding that this procedure be changed to a different date.   Spoke with [place], she is aware of the situation. Will route to her for follow up.

## 2018-06-23 NOTE — Telephone Encounter (Signed)
Patient returned call, CB is [phone].

## 2018-06-23 NOTE — Telephone Encounter (Signed)
Call made to patient to make aware of BI recommendations. The patient states she is emotionally not ready to do the procedure and she will let us know when she is ready. She states too much happened too fast and she just does not feel good about it. Patient states she would rather speak with MR about it at her next appointment. Confirmed appt date and time. Nothing further is needed at this time.

## 2018-06-23 NOTE — Telephone Encounter (Signed)
Please change appt 07/17/2018 from Dr Valeta Harms to Dr Treasa School -0 first avail ILD clinic

## 2018-06-24 ENCOUNTER — Inpatient Hospital Stay (HOSPITAL_COMMUNITY): Admission: RE | Admit: 2018-06-24 | Payer: Medicare Other | Source: Ambulatory Visit

## 2018-06-24 ENCOUNTER — Encounter (HOSPITAL_COMMUNITY): Payer: Self-pay | Admitting: Certified Registered"

## 2018-06-24 NOTE — Progress Notes (Addendum)
I  was informed that [name] is cancelling surgery.  I called [name] and she reports that she called Dr Juline Patch office and told them that "it is moving too fast". [name] is seeking a second opinion.

## 2018-06-25 ENCOUNTER — Ambulatory Visit (HOSPITAL_COMMUNITY): Admission: RE | Admit: 2018-06-25 | Payer: Medicare Other | Source: Home / Self Care | Admitting: Pulmonary Disease

## 2018-06-25 ENCOUNTER — Encounter (HOSPITAL_COMMUNITY): Admission: RE | Payer: Self-pay | Source: Home / Self Care

## 2018-06-25 SURGERY — BRONCHOSCOPY, WITH EBUS
Anesthesia: General

## 2018-07-01 ENCOUNTER — Encounter: Payer: Self-pay | Admitting: Internal Medicine

## 2018-07-01 ENCOUNTER — Ambulatory Visit: Payer: Medicare Other | Admitting: Internal Medicine

## 2018-07-01 VITALS — BP 120/74 | HR 87 | Ht 65.5 in | Wt 187.2 lb

## 2018-07-01 DIAGNOSIS — R590 Localized enlarged lymph nodes: Secondary | ICD-10-CM | POA: Diagnosis not present

## 2018-07-01 DIAGNOSIS — R918 Other nonspecific abnormal finding of lung field: Secondary | ICD-10-CM

## 2018-07-01 DIAGNOSIS — J849 Interstitial pulmonary disease, unspecified: Secondary | ICD-10-CM | POA: Diagnosis not present

## 2018-07-01 NOTE — Progress Notes (Signed)
05/23/18 0915  Sylvia [name] Integrated ILD questionnaire  Symptoms: Shortness of breath began gradually.  Patient has had shortness of breath for the last 12 years but has worsened over the last 3 to 4 weeks.  Cough started 11 to 12 years ago and it is still the same.  It is [name] moderate cough.  Do not cough at night.  To bring up phlegm.  Phlegm color is clear. Past medical history: Type 2 diabetes.  Thyroid disease.  One episode of pneumonia.  One episode of pleurisy Review of symptoms: Patient endorses: Fatigue, occasional joint stiffness, nausea, heartburn Family history: Patient denies any family history to [name] fibrosis, COPD, asthma, sarcoidosis, cystic fibrosis, hypersensitivity pneumonitis, autoimmune diseases. Exposure history: Former tobacco smoker quit 1977.  45-pack-year smoking history.  Denies cigar, pipe, vaping.  Denies non-tobacco use or illicit drug use. Home and hobby details: Single-family home.  [place] residence.  Have lived at home since 1975.  Age of home is 40.5 years.  This is not [name] damp living environment.  There is multi-mildew in shower curtain.  Bathroom does have mold and mildew.  Patient does not use humidifier or CPAP or nebulizer.  Patient does not seem iron.  Patient does not have [name] Jacuzzi or misting found.  Patient did have [name] pet parakeet as [name] teenager.  Patient does not have any pet gerbils hamsters rabbits or rodents in the house.  Patient does have feather pillows but there are 3 pillows not to sleep on.  There is not mold in her [facility] duct system.  She does not play any wind instruments.  Patient does like to garden (flowers).  Patient does have mulch in her outdoor gardens.  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  Patient had [name] parakeet as [name] teenager.  Patient patient has [name] feather duvet.  Patient does paint.  Patient sews.   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  Patient has cats and dogs at home. Medication history: Patient denies any medication exposures.  Serology - dec 2019 - negative, trace positie RNP and RF  OV 07/01/2018  Subjective:  Patient ID: Sylvia [name], female , DOB: 05-13-1941 , age 78 y.o. , MRN: [id number] , ADDRESS: [place] [id number]   07/01/2018 -   Chief Complaint  Patient presents with  . Follow-up    Pt states she is about the same since last visit. States she still has some SOB, has an occ cough with clear mucus. Denies any CP.   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 -   Presents for follow-up of the above issues.  I personally not seen her since 2016.  Back then because of her indeterminate nature of her ILD and offered surgical lung biopsy but she had refused.  Then she was lost to follow-up.  She tells me that around Thanksgiving 2019 she developed [name] viral infection and after that  she has had worsening shortness of breath and respiratory symptoms.  She slowly improved since then.  She says she still needs oxygen with walking.  She is working with physical therapy.  In fact walking desaturation test 185 feet x 3 laps on room air: She was unable to do 1 lap.  She had good gait.  She desaturated to 88% and had to stop.  This then resulted in recovery of oxygen.  Review of her scans show that even in 2018 her ILD was getting worse.  Subsequent scan that I personally visualized and done in December 2019 that showed progressive ILD  since 2015/2016.  I agree with the radiologist that its not consistent with [name] UIP pattern with upper lobe disease and more honeycombing in the upper lobe with air trapping.  It is very suspicious for indeterminate or alternative diagnostic category on radiology for 2018 ATS criteria.  The alternative diagnosis on radiology that would fit in most of this disease would be hypersensitive pneumonitis.  Review of her ILD questionnaire done in December 2019 by nurse practitioner shows she is [name] high risk candidate December 2019 and mold exposure the house.  The other consideration is autoimmune interstitial lung disease but her serology have been waxing and waning and most recently nearly negative  The other issue was the discovery of [name] left upper lobe lung mass greater than 2 cm.  She had [name] PET scan that shows this to be PET active.  Is also possible reactive mediastinal adenopathy.  She met with Dr. June Leap and was offered an navigational bronchoscopy with endobronchial ultrasound.  She needed some time to think about this because she felt overwhelmed.  She was not ready to do this last week.  After this the schedule was not available.  Therefore the procedure was not done.  She is telling me today that her husband sister 1 is Sylvia [name] might in 2014 had surgical lung resection of stage I adenocarcinoma by Dr. Erasmo Leventhal and is doing really well.  [I reviewed the chart and confirmed the same].  She is wanting to see Dr. Erasmo Leventhal.  I reminded her that this was the original plan in 2015/2016 to get lung biopsy.  For her ILD       Results for Sylvia [name], Sylvia [name] (MRN [id number]) as of 07/01/2018 16:25  Ref. Range 10/30/2013 10:43 07/15/2014 15:43  FVC-Pre Latest Units: L 2.41 2.44  FVC-%Pred-Pre Latest Units: % 79 81   Results for Sylvia [name], Sylvia [name] (MRN [id number]) as of 07/01/2018 16:25  Ref. Range 10/30/2013 10:43 07/15/2014 15:43  DLCO unc Latest Units: ml/min/mmHg 15.85 15.61  DLCO unc % pred  Latest Units: % 61 60   ROS - per Sylvia     has [name] past medical history of Bronchiectasis, Chronic diarrhea, Chronic rhinitis, Diabetes mellitus ([place]), Hyperplastic colon polyp, IBS (irritable bowel syndrome), ILD (interstitial lung disease) ([place]), and Stenosis of rectum and anus.   reports that she quit smoking about 42 years ago. Her smoking use included cigarettes. She has [name] 45.00 pack-year smoking history. She has never used smokeless tobacco.  Past Surgical History:  Procedure Laterality Date  . CHOLECYSTECTOMY    . DILATION AND CURETTAGE OF UTERUS    . HAND SURGERY  1991   left hand; Baker's Cyst removed  . migraine    . tear duct surgery      Allergies  Allergen Reactions  . Penicillins Diarrhea    Has patient had [name] PCN reaction causing  immediate rash, facial/tongue/throat swelling, SOB or lightheadedness with hypotension: Unknown Has patient had [name] PCN reaction causing severe rash involving mucus membranes or skin necrosis: Unknown Has patient had [name] PCN reaction that required hospitalization: No  Has patient had [name] PCN reaction occurring within the last 10 years: No  If all of the above answers are "NO", then may proceed with Cephalosporin use.   . Influenza Vaccines Other (See Comments)    Severe nausea, vomiting and body aches-md told her not to take it anymore  . Clarithromycin     Unknown reaction   . Cortisone Other (See Comments)    "Made her feel like ice water was running through her veins"   . Povidone-Iodine     Unknown reaction     Immunization History  Administered Date(s) Administered  . Influenza Split 05/11/2014    Family History  Problem Relation Age of Onset  . Colon polyps Sister   . Diabetes Sister   . Heart disease Mother   . Colon cancer Neg Hx      Current Outpatient Medications:  .  acetaminophen (TYLENOL) 500 MG tablet, Take 1,000 mg by mouth at bedtime., Disp: , Rfl:  .  Ascorbic Acid (VITAMIN C) 1000 MG tablet, Take 1,000 mg by  mouth at bedtime. , Disp: , Rfl:  .  Biotin 10000 MCG TABS, Take 10,000 mcg by mouth daily. , Disp: , Rfl:  .  Calcium-Magnesium-Vitamin D (CALCIUM 1200+D3 PO), Take 1 tablet by mouth at bedtime., Disp: , Rfl:  .  clorazepate (TRANXENE) 3.75 MG tablet, TAKE 1 TABLET BY MOUTH DAILY.MAY TAKE UP TO 2 DAILY IF NEEDED (Patient taking differently: Take 3.75 mg by mouth daily. ), Disp: 60 tablet, Rfl: 1 .  cyclobenzaprine (FLEXERIL) 10 MG tablet, Take 10 mg by mouth at bedtime. , Disp: , Rfl:  .  Gluc-Chonn-MSM-Boswellia-Vit D (GLUCOSAMINE CHOND TRIPLE/VIT D) TABS, Take 1 tablet by mouth 2 (two) times daily., Disp: , Rfl:  .  levothyroxine (SYNTHROID, LEVOTHROID) 88 MCG tablet, Take 88 mcg by mouth daily before breakfast. , Disp: , Rfl:  .  Multiple Vitamin (MULTIVITAMIN) tablet, Take 1 tablet by mouth daily.  , Disp: , Rfl:  .  pioglitazone (ACTOS) 15 MG tablet, Take 15 mg by mouth daily., Disp: , Rfl:  .  pravastatin (PRAVACHOL) 20 MG tablet, Take 20 mg by mouth every evening., Disp: , Rfl: 1 .  verapamil (CALAN) 120 MG tablet, Take 60 mg by mouth 2 (two) times daily. , Disp: , Rfl:  .  albuterol (PROVENTIL HFA;VENTOLIN HFA) 108 (90 Base) MCG/ACT inhaler, Inhale 2 puffs into the lungs every 6 (six) hours as needed for wheezing or shortness of breath. (Patient not taking: Reported on 07/01/2018), Disp: 1 Inhaler, Rfl: 2      Objective:   Vitals:   07/01/18 1602  BP: 120/74  Pulse: 87  SpO2: 100%  Weight: 187 lb 3.2 oz (84.9 kg)  Height: 5' 5.5" (1.664 m)    Estimated body mass index is 30.68 kg/m as calculated from the following:   Height as of this encounter: 5' 5.5" (1.664 m).   Weight as of this encounter: 187 lb 3.2 oz (84.9 kg).  _0 @  Filed Weights   07/01/18 1602  Weight: 187 lb 3.2 oz (84.9 kg)     Physical Exam  General Appearance:    Alert, cooperative, no distress, appears stated age - yes , Deconditioned looking - no , OBESE  - yes, Sitting on Wheelchair -  Yes  but walks fine (this is just for o2 need)  Head:    Normocephalic, without obvious abnormality, atraumatic  Eyes:    PERRL, conjunctiva/corneas clear,  Ears:    Normal TM's and external ear canals, both ears  Nose:   Nares normal, septum midline, mucosa normal, no drainage    or sinus tenderness. OXYGEN ON  - no . Patient is @ ra   Throat:   Lips, mucosa, and tongue normal; teeth and gums normal. Cyanosis on lips - no  Neck:   Supple, symmetrical, trachea midline, no adenopathy;    thyroid:  no enlargement/tenderness/nodules; no carotid   bruit or JVD  Back:     Symmetric, no curvature, ROM normal, no CVA tenderness  Lungs:     Distress - no , Wheeze no, Barrell Chest - no, Purse lip breathing - no, Crackles - scatteredyes   Chest Wall:    No tenderness or deformity.    Heart:    Regular rate and rhythm, S1 and S2 normal, no rub   or gallop, Murmur - no  Breast Exam:    NOT DONE  Abdomen:     Soft, non-tender, bowel sounds active all four quadrants,    no masses, no organomegaly. Visceral obesity - yes  Genitalia:   NOT DONE  Rectal:   NOT DONE  Extremities:   Extremities - normal, Has Cane - no, Clubbing - no, Edema - no  Pulses:   2+ and symmetric all extremities  Skin:   Stigmata of Connective Tissue Disease - no  Lymph nodes:   Cervical, supraclavicular, and axillary nodes normal  Psychiatric:  Neurologic:   Pleasant - yes, Anxious - yes, Flat affect - no  CAm-ICU - neg, Alert and Oriented x 3 - yes, Moves all 4s - yes, Speech - normal, Cognition - intact           Assessment:       ICD-10-CM   1. ILD (interstitial lung disease) (HCC) J84.9 [name] function test  2. Mass of upper lobe of left lung R91.8   3. Mediastinal adenopathy R59.0    In terms of interstitial lung disease diagnosis:   -  the leading candidate here is chronic hypersensitivity pneumonitis.  It is definitely progressive.  I think [name] pretest probably is high enough that [name] simple bronchoscopy with  lavage and cell count with transbronchial biopsies for histopathology and also RNA genomic classifier called ENVISIA test might be sufficient enough to clench the diagnosis.  However if she is having lung mass resection and there is no added risks she might as well have [name] surgical lung biopsy  In terms of therapies for interstitial lung disease  -Given the fact she has progressive hypersensitive pneumonitis or progressive ILD anti-fibrotic therapy is now indicated given the new emerging literature on this.  If she has hypersensitivity pneumonitis or NSIP then prednisone would be indicated.  Hopefully if she has some kind of [name] lung biopsy we can make some decisions about this  -At this point in time I recommended no use of feather blankets which is gotten rid of.  No birds in the house which she is confirmed.  I also recommended that she get rid of the mold and mildew in the house as soon as possible.  She is working on this   In terms of lung mass and mediastinal nodes:   -Given the schedule difficulty we had she is going to see Dr. Roxan Hockey.  I support  this fully.  I have advised her that he will have to do [name] complete preoperative risk evaluation and if it is deemed safe then very likely he would start with [name] bronchoscopy endobronchial ultrasound versus mediastinoscopy and if this is free of cancer he would proceed to [name] left upper lobe resection.  However, I have advised her that he will make the final decision on this.  -Certainly if this will be [name] lung mass resection then getting lung tissue for interstitial lung disease would be helpful  - she would need pft before she sees him on 07/09/2018      Plan:     Patient Instructions  ILD (interstitial lung disease) ([place]) - this has progressed over time - the viral infection thanksgiiving 2019 functioned like [name] hit and run whereby your underlying fibrosis is permanently worse - you need lung biopsy ideally or at [name] minimum bronchoscopy with  lavage and bronchoscopic biopsy   Plan - get rid of feather duvet   - do spirometry pre and post BD and dlco - do this before you see Dr Roxan Hockey 07/09/2018 - based on outcome of Dr Roxan Hockey visit we can decide on best biopsy method  - possible that Dr Roxan Hockey can himself do it when addressing mass issue   Mass of upper lobe of left lung Mediastinal adenopathy  - the reason Dr Valeta Harms wanted to do bronchoscopy was because of mediastinal nodes that are somewhat reactive  - agree good idea to see Dr Roxan Hockey and see how he wants to approach the issue  - is possible he does bronchoscopy first and if mediastinal nodes are not cancer he moves to resecting your mass (just like for your sister in law)   - of course he needs to feel your lungs, heart and body can handle surgery  Followup  - PFT test as above in < 1 week  -r eturn to see Dr [name] in ILD clinic in 4 weeks or sooner if needed  > 50% of this > 40 min visit spent in face to face counseling or/and coordination of care - by this undersigned MD - Dr Brand Males. This includes one or more of the following documented above: discussion of test results, diagnostic or treatment recommendations, prognosis, risks and benefits of management options, instructions, education, compliance or risk-factor reduction    SIGNATURE    Dr. Brand Males, M.D., F.C.C.P,  [name] and Critical Care Medicine Staff Physician, [place] Director - Interstitial Lung Disease  Program  [name] [place] at [place], [place], [address]  Pager: [phone], If no answer or between  15:00h - 7:00h: call [phone] Telephone: [phone]  5:24 PM 07/01/2018

## 2018-07-01 NOTE — Patient Instructions (Addendum)
ILD (interstitial lung disease) ([place]) - this has progressed over time - the viral infection thanksgiiving 2019 functioned like a hit and run whereby your underlying fibrosis is permanently worse - you need lung biopsy ideally or at a minimum bronchoscopy with lavage and bronchoscopic biopsy   Plan - get rid of feather duvet   - do spirometry pre and post BD and dlco - do this before you see Dr Roxan Hockey 07/09/2018 - based on outcome of Dr Roxan Hockey visit we can decide on best biopsy method  - possible that Dr Roxan Hockey can himself do it when addressing mass issue   Mass of upper lobe of left lung Mediastinal adenopathy  - the reason Dr Valeta Harms wanted to do bronchoscopy was because of mediastinal nodes that are somewhat reactive  - agree good idea to see Dr Roxan Hockey and see how he wants to approach the issue  - is possible he does bronchoscopy first and if mediastinal nodes are not cancer he moves to resecting your mass (just like for your sister in law)   - of course he needs to feel your lungs, heart and body can handle surgery  Followup  - PFT test as above in < 1 week  -r eturn to see Dr [name] in ILD clinic in 4 weeks or sooner if needed

## 2018-07-07 ENCOUNTER — Ambulatory Visit (INDEPENDENT_AMBULATORY_CARE_PROVIDER_SITE_OTHER): Payer: Medicare Other | Admitting: Internal Medicine

## 2018-07-07 DIAGNOSIS — J849 Interstitial pulmonary disease, unspecified: Secondary | ICD-10-CM

## 2018-07-07 LAB — PULMONARY FUNCTION TEST
DL/VA % pred: 84 %
DL/VA: 4.14 ml/min/mmHg/L
DLCO UNC: 11.23 ml/min/mmHg
DLCO unc % pred: 43 %
FEF 25-75 Pre: 1.87 L/sec
FEF2575-%Pred-Pre: 115 %
FEV1-%Pred-Pre: 67 %
FEV1-Pre: 1.44 L
FEV1FVC-%Pred-Pre: 115 %
FEV6-%Pred-Pre: 61 %
FEV6-Pre: 1.67 L
FEV6FVC-%Pred-Pre: 105 %
FVC-%PRED-PRE: 58 %
FVC-Pre: 1.67 L
PRE FEV6/FVC RATIO: 100 %
Pre FEV1/FVC ratio: 86 %

## 2018-07-07 NOTE — Progress Notes (Signed)
B&A spirometry and DLCO performed today.

## 2018-07-09 ENCOUNTER — Other Ambulatory Visit: Payer: Self-pay | Admitting: *Deleted

## 2018-07-09 ENCOUNTER — Institutional Professional Consult (permissible substitution): Payer: Medicare Other | Admitting: Thoracic Surgery (Cardiothoracic Vascular Surgery)

## 2018-07-09 VITALS — BP 135/72 | HR 60 | Resp 20 | Ht 65.5 in | Wt 187.0 lb

## 2018-07-09 DIAGNOSIS — R911 Solitary pulmonary nodule: Secondary | ICD-10-CM

## 2018-07-09 NOTE — Progress Notes (Signed)
PCP is [name], MD Referring Provider is [name], MD  Chief Complaint  Patient presents with  . Lung Lesion    Surgical eval, Chest CT 05/14/18, PET Scan 06/05/18, PFT's 07/07/18    HPI: [name] is sent for consultation regarding a left upper lobe lung mass  [name] is a 78 year old woman with a history of interstitial lung disease and pulmonary fibrosis, bronchiectasis, remote tobacco abuse, irritable bowel syndrome, chronic diarrhea, hyperlipidemia, hypothyroidism, and type 2 diabetes.  She smoked about 3 packs of cigarettes a day for 15 years but quit in 1977.  She says she has had a cough for about 10 or 11 years.  About 9 years ago she was diagnosed with interstitial lung disease.  At one point she was referred to [place] for a lung biopsy but did not want to have it done and never came to the office.    She says that around Thanksgiving she had a virus.  From the chart she was admitted in early December.  It started with a flareup of her IBS and she had had significant diarrhea and became very weak.  On the day of admission she became short of breath and her inhalers did not help.  On arrival to the ER her sat was in the 53s.  She was admitted with acute on chronic respiratory failure.  She improved and was discharged home the following day.  She was discharged on 2 L nasal cannula to use with ambulation.  She was not using it at rest.  A CT showed progression of her interstitial lung disease and pulmonary fibrosis.  There also was a left upper lobe mass.  She was seen by pulmonary and follow-up.  A PET/CT was performed and it showed the mass was hypermetabolic with an SUV of 14.  She also had some enlarged and mildly hypermetabolic mediastinal lymph nodes, SUV max of 5.  There was low-level hypermetabolism diffusely associated with her interstitial lung disease.  She saw Dr. Valeta Harms.  He had schedule her for a navigational bronchoscopy and endobronchial ultrasound, but she was not  comfortable with that interaction and canceled the procedure.  She saw Dr. [name] and requested a referral here for possible surgical resection.  He is not sure how far she can walk but her husband thinks she can go about 100 feet but might have to stop.  She has not tried walking up a flight of stairs.  She has been using oxygen when walking at home.  She has a chronic cough.  Frequent wheezing.  She is lost about 10 pounds over the past 3 months, most of that around the time she was ill and hospitalized.  She complains of a loss of appetite and decreased energy.  Zubrod Score: At the time of surgery this patient's most appropriate activity status/level should be described as: []     0    Normal activity, no symptoms []     1    Restricted in physical strenuous activity but ambulatory, able to do out light work [x]     2    Ambulatory and capable of self care, unable to do work activities, up and about >50 % of waking hours                              []     3    Only limited self care, in bed greater than 50% of waking hours []   4    Completely disabled, no self care, confined to bed or chair []     5    Moribund  Past Medical History:  Diagnosis Date  . Bronchiectasis    PFTs August 23, 2010 VC 82%   dlco 62 > 129 CORRECTED  . Chronic diarrhea   . Chronic rhinitis    sinus CT ordered August 23, 2010  . Diabetes mellitus ([place])   . Hyperplastic colon polyp   . IBS (irritable bowel syndrome)   . ILD (interstitial lung disease) ([place])   . Stenosis of rectum and anus     Past Surgical History:  Procedure Laterality Date  . CHOLECYSTECTOMY    . DILATION AND CURETTAGE OF UTERUS    . HAND SURGERY  1991   left hand; Baker's Cyst removed  . migraine    . tear duct surgery      Family History  Problem Relation Age of Onset  . Colon polyps Sister   . Diabetes Sister   . Heart disease Mother   . Colon cancer Neg Hx     Social History Social History   Tobacco Use  . Smoking  status: Former Smoker    Packs/day: 3.00    Years: 15.00    Pack years: 45.00    Types: Cigarettes    Last attempt to quit: 06/11/1976    Years since quitting: 42.1  . Smokeless tobacco: Never Used  Substance Use Topics  . Alcohol use: No  . Drug use: No    Current Outpatient Medications  Medication Sig Dispense Refill  . acetaminophen (TYLENOL) 500 MG tablet Take 1,000 mg by mouth at bedtime.    [name] albuterol (PROVENTIL HFA;VENTOLIN HFA) 108 (90 Base) MCG/ACT inhaler Inhale 2 puffs into the lungs every 6 (six) hours as needed for wheezing or shortness of breath. 1 Inhaler 2  . Ascorbic Acid (VITAMIN C) 1000 MG tablet Take 1,000 mg by mouth at bedtime.     . Biotin 10000 MCG TABS Take 10,000 mcg by mouth daily.     . Calcium-Magnesium-Vitamin D (CALCIUM 1200+D3 PO) Take 1 tablet by mouth at bedtime.    . clorazepate (TRANXENE) 3.75 MG tablet TAKE 1 TABLET BY MOUTH DAILY.MAY TAKE UP TO 2 DAILY IF NEEDED (Patient taking differently: Take 3.75 mg by mouth daily. ) 60 tablet 1  . cyclobenzaprine (FLEXERIL) 10 MG tablet Take 10 mg by mouth at bedtime.     . Gluc-Chonn-MSM-Boswellia-Vit D (GLUCOSAMINE CHOND TRIPLE/VIT D) TABS Take 1 tablet by mouth 2 (two) times daily.    [name] levothyroxine (SYNTHROID, LEVOTHROID) 88 MCG tablet Take 88 mcg by mouth daily before breakfast.     . Multiple Vitamin (MULTIVITAMIN) tablet Take 1 tablet by mouth daily.      . pioglitazone (ACTOS) 15 MG tablet Take 15 mg by mouth daily.    . pravastatin (PRAVACHOL) 20 MG tablet Take 20 mg by mouth every evening.  1  . verapamil (CALAN) 120 MG tablet Take 60 mg by mouth 2 (two) times daily.      No current facility-administered medications for this visit.     Allergies  Allergen Reactions  . Penicillins Diarrhea    Has patient had a PCN reaction causing immediate rash, facial/tongue/throat swelling, SOB or lightheadedness with hypotension: Unknown Has patient had a PCN reaction causing severe rash involving mucus  membranes or skin necrosis: Unknown Has patient had a PCN reaction that required hospitalization: No  Has patient had a PCN reaction occurring  within the last 10 years: No  If all of the above answers are "NO", then may proceed with Cephalosporin use.   . Influenza Vaccines Other (See Comments)    Severe nausea, vomiting and body aches-md told her not to take it anymore  . Clarithromycin     Unknown reaction   . Cortisone Other (See Comments)    "Made her feel like ice water was running through her veins"   . Povidone-Iodine     Unknown reaction     Review of Systems  Constitutional: Positive for activity change, appetite change, fatigue and unexpected weight change (Lost 10 pounds in 3 months).  HENT: Positive for dental problem (Dentures). Negative for trouble swallowing and voice change.   Eyes: Positive for visual disturbance (Blurry).  Respiratory: Positive for cough (For 11 years), shortness of breath and wheezing.   Cardiovascular: Negative for chest pain, palpitations and leg swelling.  Gastrointestinal: Positive for abdominal pain (Heartburn).  Genitourinary: Positive for frequency. Negative for dysuria.  Musculoskeletal: Negative for arthralgias and myalgias.  Neurological: Negative for seizures, syncope and headaches.  Psychiatric/Behavioral: The patient is nervous/anxious.   All other systems reviewed and are negative.   BP 135/72   Pulse 60   Resp 20   Ht 5' 5.5" (1.664 m)   Wt 187 lb (84.8 kg)   SpO2 92%   BMI 30.65 kg/m  Physical Exam Vitals signs reviewed.  Constitutional:      General: She is not in acute distress.    Appearance: She is obese.  HENT:     Head: Normocephalic and atraumatic.  Eyes:     General: No scleral icterus.    Extraocular Movements: Extraocular movements intact.  Neck:     Vascular: No carotid bruit.  Cardiovascular:     Rate and Rhythm: Normal rate and regular rhythm.     Pulses: Normal pulses.     Heart sounds: No murmur. No  friction rub. No gallop.   Pulmonary:     Effort: Pulmonary effort is normal. No respiratory distress.     Breath sounds: Rales (Bilateral) present. No wheezing.  Abdominal:     General: There is no distension.     Palpations: Abdomen is soft.     Tenderness: There is no abdominal tenderness.  Musculoskeletal:        General: No swelling.  Lymphadenopathy:     Cervical: No cervical adenopathy.  Skin:    General: Skin is warm and dry.  Neurological:     General: No focal deficit present.     Mental Status: She is alert and oriented to person, place, and time.     Cranial Nerves: No cranial nerve deficit.     Motor: No weakness.     Coordination: Coordination normal.    Diagnostic Tests: CT CHEST WITHOUT CONTRAST  TECHNIQUE: Multidetector CT imaging of the chest was performed following the standard protocol without intravenous contrast. High resolution imaging of the lungs, as well as inspiratory and expiratory imaging, was performed.  COMPARISON:  Chest CT 05/10/2017.  FINDINGS: Cardiovascular: Heart size is normal. There is no significant pericardial fluid, thickening or pericardial calcification. There is aortic atherosclerosis, as well as atherosclerosis of the great vessels of the mediastinum and the coronary arteries, including calcified atherosclerotic plaque in the left main, left anterior descending, left circumflex and right coronary arteries.  Mediastinum/Nodes: No pathologically enlarged mediastinal or hilar lymph nodes. Please note that accurate exclusion of hilar adenopathy is limited on noncontrast CT scans.  Esophagus is unremarkable in appearance. No axillary lymphadenopathy.  Lungs/Pleura: High-resolution images demonstrate widespread but patchy multifocal ground-glass attenuation throughout the lungs bilaterally with scattered areas of septal thickening, thickening of the peribronchovascular interstitium and some rare areas of mild cylindrical  bronchiectasis. Peripheral bronchiolectasis and several scattered areas of peripheral honeycombing are noted. These findings have no definitive craniocaudal gradient, and the honeycombing is most evident in the upper lobes of the lungs bilaterally. Overall, there has been mild progression of disease compared to the prior study 05/10/2017, most notably in terms of the extent of ground-glass attenuation. Inspiratory and expiratory imaging demonstrates moderate air trapping indicative of small airways disease. In the periphery of the left upper lobe there is a enlarging mass-like appearing area which has internal air bronchograms. This is highly irregular in shape and therefore difficult to measure, however, the most focal portion of this is best demonstrated on axial image 55 of series 4 and coronal image 46 of series 7, where this lesion currently measures 2.5 x 2.0 x 3.3 cm.  Upper Abdomen: Aortic atherosclerosis.  Status post cholecystectomy.  Musculoskeletal: Small sclerotic lesion with narrow zone of transition in the left side of the T3 vertebral body, stable compared to the prior study, most compatible with a small bone island. There are no other larger more aggressive appearing lytic or blastic lesions noted in the visualized portions of the skeleton.  IMPRESSION: 1. Persistent evidence of interstitial lung disease with notable progression compared to the prior examination from 05/10/2017. 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 2. Enlarging mass-like area in the periphery of the left upper lobe, currently measuring 2.5 x 2.0 x 3.3 cm, with internal air bronchograms. The possibility of a slow-growing neoplasm such as  a primary bronchogenic adenocarcinoma should be considered. Further evaluation with nonemergent PET-CT is recommended in the near future. 3. Aortic atherosclerosis, in addition to left main and 3 vessel coronary artery disease. Assessment for potential risk factor modification, dietary therapy or pharmacologic therapy may be warranted, if clinically indicated.  Aortic Atherosclerosis (ICD10-I70.0).   Electronically Signed   By: Vinnie Langton M.D.   On: 05/14/2018 19:55  NUCLEAR MEDICINE PET SKULL BASE TO THIGH  TECHNIQUE: 9.4 mCi F-18 FDG was injected intravenously. Full-ring PET imaging was performed from the skull base to thigh after the radiotracer. CT data was obtained and used for attenuation correction and anatomic localization.  Fasting blood glucose: 137 mg/dl  COMPARISON:  05/14/2018 high-resolution chest CT.  FINDINGS: Mediastinal blood pool activity: SUV max 2.7  NECK: No areas of abnormal hypermetabolism.  Incidental CT findings: Cerebral atrophy with prominence of the extra-axial spaces adjacent the frontal lobes. Intracranial atherosclerosis. Mucosal thickening of left maxillary sinus. Left carotid atherosclerosis. No cervical adenopathy.  CHEST: Low-level hypermetabolism corresponding to prominent prevascular and mildly enlarged middle mediastinal nodes. A low right paratracheal/precarinal node measures 1.4 cm and a S.U.V. max of 5.0 on image 64/4. This node measures 1.3 cm on 05/10/2017.  Hypermetabolism corresponding to the posterior left upper lobe soft tissue density lesion. This measures 2.3 x 2.0 cm and a S.U.V. max of 14.1 on image 23/8.  More diffuse low-level hypermetabolism is peripheral predominant and corresponds to interstitial lung disease.  Incidental CT findings: Aortic and coronary artery atherosclerosis. Tiny hiatal hernia.  ABDOMEN/PELVIS: No abdominopelvic parenchymal or nodal  hypermetabolism.  Incidental CT  findings: Normal adrenal glands. Cholecystectomy. Abdominal aortic atherosclerosis.  SKELETON: No abnormal marrow activity.  Incidental CT findings: none  IMPRESSION: 1. Hypermetabolism corresponding to the pleural-based left upper lobe soft tissue density lesion, suspicious for primary bronchogenic carcinoma. 2. Low-level hypermetabolism within prevascular and bilateral mediastinal nodes. Given concurrent interstitial lung disease, distribution, and relative stability over 13 months, reactive etiology is favored. Metastatic disease cannot be excluded. 3. No evidence of extrathoracic metastatic. 4. Aortic atherosclerosis (ICD10-I70.0), coronary artery atherosclerosis and emphysema (ICD10-J43.9).   Electronically Signed   By: Abigail Miyamoto M.D.   On: 06/05/2018 14:10  Pulmonary function testing 06/30/2018 FVC 1.67 (58% FEV1 1.44 (67%) FEV1/FVC 0.86 DLCO 11.23 (43%) down from 60% of predicted in 2016  Impression: [name] is a 78 year old woman with a remote history of tobacco abuse, interstitial lung disease, pulmonary fibrosis, irritable bowel syndrome, chronic diarrhea, hypothyroidism, hypertension, and type 2 diabetes.  She recently was hospitalized with acute on chronic respiratory failure.  During her stay she had a high resolution CT scan.  It showed significant progression of her interstitial lung disease and pulmonary fibrosis.  She also had a left upper lobe mass that had progressed since her previous scan.  She had a PET CT which showed the mass was hypermetabolic with an SUV of 14.  She also has some mediastinal adenopathy with moderately elevated activity with an SUV of 5.  She has diffuse low-grade metabolic activity throughout the areas of interstitial lung disease.  The left upper lobe mass is worrisome for a new primary bronchogenic carcinoma and has to be considered that unless it can be proven otherwise.  There is a possibility that this represents some type of  infectious or inflammatory process such as BOOP.  Her mediastinal adenopathy could be metastatic disease or may just be reactive given the severity of her underlying lung disease and the diffuse activity associated with that.  She strongly desires to have surgical resection.  I think she is at best a very high risk candidate and more likely not a candidate at all.  Her PFTs are not completely prohibitive although her diffusion capacity would preclude her from having a lobectomy.  I will order cardiopulmonary stress testing to get an idea of her respiratory capacity.  Given the need to assess the mediastinal lymph nodes I think the best option for her would be to do a navigational bronchoscopy and endobronchial ultrasound.  We would also be prepared to do a mediastinoscopy if needed to clarify the status of the mediastinal lymph nodes.  I had a long discussion with the patient and her husband regarding this procedure.  They understand it is diagnostic and not therapeutic.  We would plan to do it on an outpatient basis.  I informed them of the indications, risk, benefits, and alternatives.  They understand the risks include, but not limited to death, MI, DVT, PE, stroke, bleeding, pneumothorax, failure to make a diagnosis, hoarseness, as well as possibility of other unforeseeable complications.  She has a relatively high risk for pneumothorax given the peripheral location of the nodule.  She had numerous questions related to her interstitial lung disease and its prognosis.  I referred her back to [facility].  I did discuss with her and her husband that her disease has progressed significantly over the past couple of years.  She is uncertain whether she wants to proceed with biopsies.  Plan: Cardiopulmonary stress testing Electromagnetic navigational bronchoscopy with endobronchial ultrasound and possible mediastinoscopy.  Patient will call to schedule if she decides to proceed.  Melrose Nakayama,  MD [facility] [phone]

## 2018-07-10 ENCOUNTER — Telehealth (HOSPITAL_COMMUNITY): Payer: Self-pay | Admitting: *Deleted

## 2018-07-10 NOTE — Telephone Encounter (Signed)
Called patient to schedule CPX per [facility] in [facility] and under Dr. Leonarda Salon orders. No answer, left message for patient to return call to schedule ASAP.    Landis Martins, MS, ACSM-RCEP Clinical Exercise Physiologist

## 2018-07-17 ENCOUNTER — Ambulatory Visit: Payer: Medicare Other | Admitting: Pulmonary Disease

## 2018-07-18 ENCOUNTER — Encounter: Payer: Self-pay | Admitting: Cardiovascular Disease

## 2018-07-18 ENCOUNTER — Ambulatory Visit: Payer: Medicare Other | Admitting: Cardiovascular Disease

## 2018-07-18 VITALS — BP 106/64 | HR 84 | Ht 65.5 in | Wt 189.0 lb

## 2018-07-18 DIAGNOSIS — E1169 Type 2 diabetes mellitus with other specified complication: Secondary | ICD-10-CM

## 2018-07-18 DIAGNOSIS — E7800 Pure hypercholesterolemia, unspecified: Secondary | ICD-10-CM | POA: Diagnosis not present

## 2018-07-18 DIAGNOSIS — J84112 Idiopathic pulmonary fibrosis: Secondary | ICD-10-CM

## 2018-07-18 DIAGNOSIS — I2510 Atherosclerotic heart disease of native coronary artery without angina pectoris: Secondary | ICD-10-CM

## 2018-07-18 DIAGNOSIS — I10 Essential (primary) hypertension: Secondary | ICD-10-CM | POA: Diagnosis not present

## 2018-07-18 DIAGNOSIS — E669 Obesity, unspecified: Secondary | ICD-10-CM

## 2018-07-18 MED ORDER — BENZONATATE 100 MG PO CAPS: 100.0000 mg | ORAL_CAPSULE | Freq: Three times a day (TID) | ORAL | 1 refills | Status: DC | PRN

## 2018-07-18 MED ORDER — PRAVASTATIN SODIUM 40 MG PO TABS: 40.0000 mg | ORAL_TABLET | Freq: Every evening | ORAL | 1 refills | Status: DC

## 2018-07-18 NOTE — Patient Instructions (Signed)
Medication Instructions:  Start Tessalon Pearls 100 mg. Increase Pravastatin 40 mg. If you need a refill on your cardiac medications before your next appointment, please call your pharmacy.    Follow-Up: At [facility], you and your health needs are our priority.  As part of our continuing mission to provide you with exceptional heart care, we have created designated Provider Care Teams.  These Care Teams include your primary Cardiologist (physician) and Advanced Practice Providers (APPs -  Physician Assistants and Nurse Practitioners) who all work together to provide you with the care you need, when you need it. You will need a follow up appointment in 6 months.  Please call our office 2 months in advance to schedule this appointment.  You may see Sanda Klein, MD or one of the following Advanced Practice Providers on your designated Care Team: [place], [place] . Fabian Sharp, PA-C

## 2018-07-18 NOTE — Progress Notes (Signed)
Cardiology Consultation Note:    Date:  07/20/2018   ID:  [name], DOB 11-03-1940, MRN [id number]  PCP:  [name], MD  Cardiologist:  Sanda Klein, MD  Electrophysiologist:  None   Referring MD: [name], MD   Chief Complaint  Patient presents with  . Coronary Artery Disease    Coronary calcifications on chest CT    History of Present Illness:    [name] is a 78 y.o. female with a hx of interstitial lung disease, followed by Dr. [name], referred after chest CT showed evidence of extensive coronary artery calcification.  She denies angina pectoris.  She is relatively sedentary.  NYHA functional class III.  Bathing and clothing sometimes make her short of breath.  She denies leg edema, claudication, orthopnea, PND, palpitations, dizziness or syncope.  She quit smoking about 15 years ago.  Her mother died at age 59 of a myocardial infarction.  She has mild hyperlipidemia with a cholesterol is 86 on a low-dose of pravastatin.  She has type II diabetes with a hemoglobin A1c of 6%.  She does not have known peripheral carotid artery disease.  She has ago treated hypertension.  She does complain of a dry, nonproductive cough.  Interestingly it is most prominent when she is lying in bed in the morning and resolves once she stands up and walks around.  It does not occur when she lies down in the evening.  Her recent work-up shows metabolic evidence of active inflammation in her lungs.  She has a mildly positive ANA at 1: 160 p-ANCA positive at 1: 640.  She has negative PR-3 and myeloperoxidase antibodies, negative CCP, rheumatoid factor, scleroderma 70.  Active treatment was recommended in 2016 although she declined.  Chest x-ray shows progression in interstitial pulmonary fibrosis in the last few years.  She has recently received high-dose steroids.  Currently weaned off steroids.  She developed hypoxia with light activity.  She uses oxygen as needed.  She was referred  to Dr. Roxan Hockey for a hypermetabolic lung nodule in the left upper lobe.  The mass measures a maximum diameter of 3.3 cm has internal air bronchograms and is enlarging.  Dr. Roxan Hockey expressed great concern that she would not be a surgical candidate due to the underlying lung disease.  He offered electromagnetic navigational bronchoscopy with endobronchial ultrasound and possible mediastinoscopy but she was uncertain whether to go ahead with this.  Cardiopulmonary exercise test has been ordered but not yet performed.  Pulmonary function test performed on June 30, 2018 show FVC 1.7 L (58% predicted) and DLCO 43% of predicted.  Past Medical History:  Diagnosis Date  . Bronchiectasis    PFTs August 23, 2010 VC 82%   dlco 62 > 129 CORRECTED  . Chronic diarrhea   . Chronic rhinitis    sinus CT ordered August 23, 2010  . Diabetes mellitus ([place])   . Hyperplastic colon polyp   . IBS (irritable bowel syndrome)   . ILD (interstitial lung disease) ([place])   . Stenosis of rectum and anus   . Thyroid disease     Past Surgical History:  Procedure Laterality Date  .  gallbaldder    . CHOLECYSTECTOMY    . DILATION AND CURETTAGE OF UTERUS    . HAND SURGERY  1991   left hand; Baker's Cyst removed  . migraine    . tear duct surgery      Current Medications: Current Meds  Medication Sig  . acetaminophen (TYLENOL)  500 MG tablet Take 1,000 mg by mouth at bedtime.  . Ascorbic Acid (VITAMIN C) 1000 MG tablet Take 1,000 mg by mouth at bedtime.   . Biotin 10000 MCG TABS Take 10,000 mcg by mouth daily.   . Calcium-Magnesium-Vitamin D (CALCIUM 1200+D3 PO) Take 1 tablet by mouth at bedtime.  . clorazepate (TRANXENE) 3.75 MG tablet TAKE 1 TABLET BY MOUTH DAILY.MAY TAKE UP TO 2 DAILY IF NEEDED (Patient taking differently: Take 3.75 mg by mouth daily. )  . cyclobenzaprine (FLEXERIL) 10 MG tablet Take 10 mg by mouth at bedtime.   . Gluc-Chonn-MSM-Boswellia-Vit D (GLUCOSAMINE CHOND TRIPLE/VIT D) TABS Take  1 tablet by mouth 2 (two) times daily.  [name] levothyroxine (SYNTHROID, LEVOTHROID) 88 MCG tablet Take 88 mcg by mouth daily before breakfast.   . Multiple Vitamin (MULTIVITAMIN) tablet Take 1 tablet by mouth daily.    . pioglitazone (ACTOS) 15 MG tablet Take 15 mg by mouth daily.  . pravastatin (PRAVACHOL) 40 MG tablet Take 1 tablet (40 mg total) by mouth every evening.  . verapamil (CALAN) 120 MG tablet Take 60 mg by mouth 2 (two) times daily.   . [DISCONTINUED] pravastatin (PRAVACHOL) 20 MG tablet Take 20 mg by mouth every evening.     Allergies:   Penicillins; Influenza vaccines; Clarithromycin; Cortisone; and Povidone-iodine   Social History   Socioeconomic History  . Marital status: Married    Spouse name: Not on file  . Number of children: Not on file  . Years of education: Not on file  . Highest education level: Not on file  Occupational History  . Occupation: retired  Scientific laboratory technician  . Financial resource strain: Not on file  . Food insecurity:    Worry: Not on file    Inability: Not on file  . Transportation needs:    Medical: Not on file    Non-medical: Not on file  Tobacco Use  . Smoking status: Former Smoker    Packs/day: 3.00    Years: 15.00    Pack years: 45.00    Types: Cigarettes    Last attempt to quit: 06/11/1976    Years since quitting: 42.1  . Smokeless tobacco: Never Used  Substance and Sexual Activity  . Alcohol use: No  . Drug use: No  . Sexual activity: Not on file  Lifestyle  . Physical activity:    Days per week: Not on file    Minutes per session: Not on file  . Stress: Not on file  Relationships  . Social connections:    Talks on phone: Not on file    Gets together: Not on file    Attends religious service: Not on file    Active member of club or organization: Not on file    Attends meetings of clubs or organizations: Not on file    Relationship status: Not on file  Other Topics Concern  . Not on file  Social History Narrative   Daily  caffeine use     Family History: The patient's family history includes Colon polyps in her sister; Diabetes in her sister; Heart disease in her mother and sister. There is no history of Colon cancer.  ROS:   Please see the history of present illness.     All other systems reviewed and are negative.  EKGs/Labs/Other Studies Reviewed:    The following studies were reviewed today: Notes from PCP and from pulmonology , radiological studies, serologies  EKG:  EKG is  ordered today.  The ekg  ordered today demonstrates normal sinus rhythm, left anterior fascicular block and poor R wave progression, no ischemic repolarization abnormalities  Recent Labs: 05/14/2018: ALT 16; B Natriuretic Peptide 82.1; BUN 10; Creatinine, Ser 0.77; Hemoglobin 10.6; Platelets 322; Potassium 3.7; Sodium 137  Recent Lipid Panel No results found for: CHOL, TRIG, HDL, CHOLHDL, VLDL, LDLCALC, LDLDIRECT  Physical Exam:    VS:  BP 106/64   Pulse 84   Ht 5' 5.5" (1.664 m)   Wt 189 lb (85.7 kg)   BMI 30.97 kg/m     Wt Readings from Last 3 Encounters:  07/18/18 189 lb (85.7 kg)  07/09/18 187 lb (84.8 kg)  07/01/18 187 lb 3.2 oz (84.9 kg)     GEN:  Well nourished, well developed in no acute distress HEENT: Normal NECK: No JVD; No carotid bruits LYMPHATICS: No lymphadenopathy CARDIAC: RRR, no murmurs, rubs, gallops RESPIRATORY: She has distinctive Velcro-like crackles throughout all the posterior lung fields, without rales, wheezing or rhonchi  ABDOMEN: Soft, non-tender, non-distended MUSCULOSKELETAL:  No edema; No deformity  SKIN: Warm and dry NEUROLOGIC:  Alert and oriented x 3 PSYCHIATRIC:  Normal affect   ASSESSMENT:    1. Coronary artery calcification seen on CAT scan   2. Diabetes mellitus type 2 in obese ([place])   3. Hypercholesterolemia   4. Essential hypertension   5. Idiopathic pulmonary fibrosis ([place])    PLAN:    In order of problems listed above:  1. CAD: Asymptomatic coronary  calcifications were incidentally identified on chest CT.  She has numerous coronary risk factors that include type 2 diabetes, hyperlipidemia, treated hypertension and a family history of early onset CAD in a female relative.  She is scheduled for cardiopulmonary stress test which should allow [place] to see if there is evidence of ischemia.  If on that stress test cardiac limitations are worse than the pulmonary ones, she should undergo right and left cardiac catheterization.  If however, as I expect, the pulmonary limitations are greater, there is little reason to pursue cardiac catheterization. 2. DM: Excellent glycemic control.  She is on max loss but does not have any signs of heart failure. 3. HLP: Her lipid profile is not at all bad, but ideally will treat her LDL down to 70 or less.  Increase pravastatin to 40 mg at bedtime daily.  Recheck labs in 3-6 months. 4. HTN: Excellent control.  5. ILD: This is advanced and precludes many treatments including coronary bypass surgery, should we discover that she has extensive CAD.  Since she does not have angina, cardiac catheterization and percutaneous revascularization would offer little advantage, outside of acute coronary syndrome.  Her cough is a major complaint.  I offered her some Tessalon as a test to see if it will help.  Obviously, she would benefit more from treatment of the underlying disorder, if this is possible. 6. 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  Marginal candidate for resection.   Medication Adjustments/Labs and Tests Ordered: Current medicines are reviewed at length with the patient today.  Concerns regarding medicines are outlined above.  No orders of the defined types were placed in this encounter.  Meds ordered this encounter  Medications  . benzonatate (TESSALON PERLES) 100 MG capsule    Sig: Take 1 capsule (100 mg total) by mouth every 8 (eight) hours as needed for cough.     Dispense:  30 capsule    Refill:  1  . pravastatin (PRAVACHOL) 40 MG  tablet    Sig: Take 1 tablet (40 mg total) by mouth every evening.    Dispense:  90 tablet    Refill:  1    Patient Instructions  Medication Instructions:  Start Tessalon [place] 100 mg. Increase Pravastatin 40 mg. If you need a refill on your cardiac medications before your next appointment, please call your pharmacy.    Follow-Up: At [facility], you and your health needs are our priority.  As part of our continuing mission to provide you with exceptional heart care, we have created designated Provider Care Teams.  These Care Teams include your primary Cardiologist (physician) and Advanced Practice Providers (APPs -  Physician Assistants and Nurse Practitioners) who all work together to provide you with the care you need, when you need it. You will need a follow up appointment in 6 months.  Please call our office 2 months in advance to schedule this appointment.  You may see Sanda Klein, MD or one of the following Advanced Practice Providers on your designated Care Team: [name], [name] . Sylvia Sharp, PA-C        Signed, Sanda Klein, MD  07/20/2018 6:00 PM    [place]

## 2018-07-20 ENCOUNTER — Encounter: Payer: Self-pay | Admitting: Cardiovascular Disease

## 2018-07-20 DIAGNOSIS — E1169 Type 2 diabetes mellitus with other specified complication: Secondary | ICD-10-CM | POA: Insufficient documentation

## 2018-07-20 DIAGNOSIS — I2510 Atherosclerotic heart disease of native coronary artery without angina pectoris: Secondary | ICD-10-CM | POA: Insufficient documentation

## 2018-07-20 DIAGNOSIS — E7800 Pure hypercholesterolemia, unspecified: Secondary | ICD-10-CM | POA: Insufficient documentation

## 2018-07-20 DIAGNOSIS — E785 Hyperlipidemia, unspecified: Secondary | ICD-10-CM | POA: Insufficient documentation

## 2018-07-20 DIAGNOSIS — E669 Obesity, unspecified: Secondary | ICD-10-CM

## 2018-07-20 DIAGNOSIS — I10 Essential (primary) hypertension: Secondary | ICD-10-CM | POA: Insufficient documentation

## 2018-07-22 ENCOUNTER — Ambulatory Visit (HOSPITAL_COMMUNITY): Payer: Medicare Other | Attending: Thoracic Surgery (Cardiothoracic Vascular Surgery)

## 2018-07-22 ENCOUNTER — Other Ambulatory Visit (HOSPITAL_COMMUNITY): Payer: Self-pay | Admitting: *Deleted

## 2018-07-22 DIAGNOSIS — R918 Other nonspecific abnormal finding of lung field: Secondary | ICD-10-CM | POA: Diagnosis present

## 2018-07-22 DIAGNOSIS — R911 Solitary pulmonary nodule: Secondary | ICD-10-CM

## 2018-07-31 ENCOUNTER — Ambulatory Visit: Payer: Medicare Other | Admitting: Internal Medicine

## 2018-07-31 ENCOUNTER — Telehealth: Payer: Self-pay | Admitting: Internal Medicine

## 2018-07-31 ENCOUNTER — Encounter: Payer: Self-pay | Admitting: Internal Medicine

## 2018-07-31 VITALS — BP 132/72 | HR 86 | Ht 65.5 in | Wt 187.6 lb

## 2018-07-31 DIAGNOSIS — R942 Abnormal results of pulmonary function studies: Secondary | ICD-10-CM

## 2018-07-31 DIAGNOSIS — R590 Localized enlarged lymph nodes: Secondary | ICD-10-CM | POA: Diagnosis not present

## 2018-07-31 DIAGNOSIS — J849 Interstitial pulmonary disease, unspecified: Secondary | ICD-10-CM

## 2018-07-31 DIAGNOSIS — R918 Other nonspecific abnormal finding of lung field: Secondary | ICD-10-CM

## 2018-07-31 DIAGNOSIS — Z01811 Encounter for preprocedural respiratory examination: Secondary | ICD-10-CM

## 2018-07-31 NOTE — Progress Notes (Signed)
07/24/17 0915  [place] [facility] ILD questionnaire  Symptoms: Shortness of breath began gradually.  Patient has had shortness of breath for the last 12 years but has worsened over the last 3 to 4 weeks.  Cough started 11 to 12 years ago and it is still the same.  It is a moderate cough.  Do not cough at night.  To bring up phlegm.  Phlegm color is clear. Past medical history: Type 2 diabetes.  Thyroid disease.  One episode of pneumonia.  One episode of pleurisy Review of symptoms: Patient endorses: Fatigue, occasional joint stiffness, nausea, heartburn Family history: Patient denies any family history to pulmonary fibrosis, COPD, asthma, sarcoidosis, cystic fibrosis, hypersensitivity pneumonitis, autoimmune diseases. Exposure history: Former tobacco smoker quit 1977.  45-pack-year smoking history.  Denies cigar, pipe, vaping.  Denies non-tobacco use or illicit drug use. Home and hobby details: Single-family home.  [place] residence.  Have lived at home since 1975.  Age of home is 40.5 years.  This is not a damp living environment.  There is multi-mildew in shower curtain.  Bathroom does have mold and mildew.  Patient does not use humidifier or CPAP or nebulizer.  Patient does not seem iron.  Patient does not have a Jacuzzi or misting found.  Patient did have a pet parakeet as a teenager.  Patient does not have any pet gerbils hamsters rabbits or rodents in the house.  Patient does have feather pillows but there are 3 pillows not to sleep on.  There is not mold in her [facility] duct system.  She does not play any wind instruments.  Patient does like to garden (flowers).  Patient does have mulch in her outdoor gardens.  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  Patient had a parakeet as a teenager.  Patient patient has a feather duvet.  Patient does paint.  Patient sews.   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  Patient has cats and dogs at home. Medication history: Patient denies any medication exposures.  Serology - dec 2019 - negative, trace positie RNP and RF  OV 07/01/2018  Subjective:  Patient ID: [name], female , DOB: [birth year] , age 78 y.o. , MRN: [id number] , ADDRESS: [place] [id number]   07/01/2018 -   Chief Complaint  Patient presents with  . Follow-up    Pt states she is about the same since last visit. States she still has some SOB, has an occ cough with clear mucus. Denies any CP.   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 -   Presents for follow-up of the above issues.  I personally not seen her since 2016.  Back then because of her indeterminate nature of her ILD and offered surgical lung biopsy but she had refused.  Then she was lost to follow-up.  She tells me that around Thanksgiving 2019 she developed a viral infection and after that she  has had worsening shortness of breath and respiratory symptoms.  She slowly improved since then.  She says she still needs oxygen with walking.  She is working with physical therapy.  In fact walking desaturation test 185 feet x 3 laps on room air: She was unable to do 1 lap.  She had good gait.  She desaturated to 88% and had to stop.  This then resulted in recovery of oxygen.  Review of her scans show that even in 2018 her ILD was getting worse.  Subsequent scan that I personally visualized and done in December 2019 that showed progressive ILD  since 2015/2016.  I agree with the radiologist that its not consistent with a UIP pattern with upper lobe disease and more honeycombing in the upper lobe with air trapping.  It is very suspicious for indeterminate or alternative diagnostic category on radiology for 2018 ATS criteria.  The alternative diagnosis on radiology that would fit in most of this disease would be hypersensitive pneumonitis.  Review of her ILD questionnaire done in December 2019 by nurse practitioner shows she is a high risk candidate December 2019 and mold exposure the house.  The other consideration is autoimmune interstitial lung disease but her serology have been waxing and waning and most recently nearly negative  The other issue was the discovery of a left upper lobe lung mass greater than 2 cm.  She had a PET scan that shows this to be PET active.  Is also possible reactive mediastinal adenopathy.  She met with Dr. June Leap and was offered an navigational bronchoscopy with endobronchial ultrasound.  She needed some time to think about this because she felt overwhelmed.  She was not ready to do this last week.  After this the schedule was not available.  Therefore the procedure was not done.  She is telling me today that her husband sister 1 is [name] might in 2014 had surgical lung resection of stage I adenocarcinoma by Dr. Erasmo Leventhal and is doing really well.  [I reviewed the chart and confirmed the same].  She is wanting to see Dr. Erasmo Leventhal.  I reminded her that this was the original plan in 2015/2016 to get lung biopsy.  For her ILD     ROS  OV 07/31/2018  Subjective:  Patient ID: [name], female , DOB: September 25, 1940 , age 78 y.o. , MRN: [id number] , ADDRESS: [place] [id number]   07/31/2018 -   Chief Complaint  Patient presents with  . Follow-up    PFT performed 1/27. Pt did have consultation with Dr. Roxan Hockey 1/29.  Pt had a CPST 2/11. Pt states she is about the same as  last visit and states she still becomes SOB when she exerts herself, has an occ cough with clear mucus. Denies any complaints of CP or chest tightness.   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 -returns for follow-up to discuss ongoing work-up for the above.  After my last visit she did see Dr. Erasmo Leventhal with thoracic surgery.  He was worried about surgical risk with left upper lobe lobectomy.  Also she had coronary artery calcification.  She saw Dr. see in cardiology on July 18, 2018.  He wanted to see the results of the CPS to before he decided whether he would do further cardiac  testing.  She has not had an echocardiogram at this point.  Then on July 22, 2018 she underwent cardiopulmonary stress testing.  In a nutshell the final report was believed that she had a mixture of circulatory and respiratory impairment.  Interestingly despite her ILD she only desaturated to 89% on a full maximal stress test. Exercise testing with gas exchange demonstrates a mildly reduced peak VO2 of 10.2 ml/kg/min (75% of the age/gender/weight matched sedentary norms). The RER of 1.14 indicates a maximal effort. When adjusted to the patient's ideal body weight of 144 lb (65.3 kg) the peak VO2 is 13.3 ml/kg (ibw)/min (83% of the ibw-adjusted predicted.  At this point in time she does not have follow-up with either Dr. Loletha Grayer or Dr. Roxan Hockey.  Her understanding is that they will review these results and then get back to her about surgical risk.  It appears that she is quite keen on getting the lung mass resected.  In terms of the ILD she had pulmonary function testing that shows progressive ILD.  She continues have significant shortness of breath and cough.  She is here with her husband to discuss all this.  She keeps asking circular questions about her  ILD and lung mass and treatment options.      SYMPTOM SCALE - ILD 07/31/2018   O2 use RA*  Shortness of Breath 0 -> 5 scale with 5 being worst (score 6 If unable to do)  At rest 0  Simple tasks - showers, clothes change, eating, shaving 3.5  Household (dishes, doing bed, laundry) 4  Shopping unable  Walking level at own pace 3  Walking keeping up with others of same age unable  Walking up Stairs unable  Walking up [place] unable  Total (40 - 48) Dyspnea Score 34.5  How bad is your cough? 5  How bad is your fatigue 4       Results for Dost, CLEOLA PERRYMAN (MRN [id number]) as of 07/31/2018 15:57  Ref. Range 10/30/2013 10:43 07/15/2014 15:43 07/07/2018 15:50  FVC-Pre Latest Units: L 2.41 2.44 1.67  FVC-%Pred-Pre Latest Units: % 79 81 58   Results for Bendavid, Noor A (MRN [id number]) as of 07/31/2018 17:56  Ref. Range 07/07/2018 15:50  FEV1-Pre Latest Units: L 1.44  FEV1-%Pred-Pre Latest Units: % 67    Results for Hartgrove, Tanayah A (MRN [id number]) as of 07/31/2018 15:57  Ref. Range 10/30/2013 10:43 07/15/2014 15:43 07/07/2018 15:50  DLCO unc Latest Units: ml/min/mmHg 15.85 15.61 11.23  DLCO unc % pred Latest Units: % 61 60 43   ROS - per HPI     has a past medical history of Bronchiectasis, Chronic diarrhea, Chronic rhinitis, Diabetes mellitus ([place]), Hyperplastic colon polyp, IBS (irritable bowel syndrome), ILD (interstitial lung disease) ([place]), Stenosis of rectum and anus, and Thyroid disease.   reports that she quit smoking about 42 years ago. Her smoking use included cigarettes. She has a 45.00 pack-year smoking history. She has never used smokeless tobacco.  Past Surgical History:  Procedure Laterality Date  .  gallbaldder    . CHOLECYSTECTOMY    . DILATION AND CURETTAGE OF UTERUS    . HAND SURGERY  1991   left hand; Baker's Cyst removed  . migraine    . tear duct surgery      Allergies  Allergen Reactions  . Penicillins Diarrhea    Has patient had a PCN reaction causing  immediate rash, facial/tongue/throat swelling, SOB or lightheadedness with hypotension: Unknown Has patient had a PCN reaction causing  severe rash involving mucus membranes or skin necrosis: Unknown Has patient had a PCN reaction that required hospitalization: No  Has patient had a PCN reaction occurring within the last 10 years: No  If all of the above answers are "NO", then may proceed with Cephalosporin use.   . Influenza Vaccines Other (See Comments)    Severe nausea, vomiting and body aches-md told her not to take it anymore  . Clarithromycin     Unknown reaction   . Cortisone Other (See Comments)    "Made her feel like ice water was running through her veins"   . Povidone-Iodine     Unknown reaction     Immunization History  Administered Date(s) Administered  . Influenza Split 05/11/2014    Family History  Problem Relation Age of Onset  . Colon polyps Sister   . Heart disease Sister   . Diabetes Sister   . Heart disease Mother   . Colon cancer Neg Hx      Current Outpatient Medications:  .  acetaminophen (TYLENOL) 500 MG tablet, Take 1,000 mg by mouth at bedtime., Disp: , Rfl:  .  Ascorbic Acid (VITAMIN C) 1000 MG tablet, Take 1,000 mg by mouth at bedtime. , Disp: , Rfl:  .  benzonatate (TESSALON PERLES) 100 MG capsule, Take 1 capsule (100 mg total) by mouth every 8 (eight) hours as needed for cough., Disp: 30 capsule, Rfl: 1 .  Biotin 10000 MCG TABS, Take 10,000 mcg by mouth daily. , Disp: , Rfl:  .  Calcium-Magnesium-Vitamin D (CALCIUM 1200+D3 PO), Take 1 tablet by mouth at bedtime., Disp: , Rfl:  .  clorazepate (TRANXENE) 3.75 MG tablet, TAKE 1 TABLET BY MOUTH DAILY.MAY TAKE UP TO 2 DAILY IF NEEDED (Patient taking differently: Take 3.75 mg by mouth daily. ), Disp: 60 tablet, Rfl: 1 .  cyclobenzaprine (FLEXERIL) 10 MG tablet, Take 10 mg by mouth at bedtime. , Disp: , Rfl:  .  Gluc-Chonn-MSM-Boswellia-Vit D (GLUCOSAMINE CHOND TRIPLE/VIT D) TABS, Take 1 tablet by mouth 2  (two) times daily., Disp: , Rfl:  .  levothyroxine (SYNTHROID, LEVOTHROID) 88 MCG tablet, Take 88 mcg by mouth daily before breakfast. , Disp: , Rfl:  .  Multiple Vitamin (MULTIVITAMIN) tablet, Take 1 tablet by mouth daily.  , Disp: , Rfl:  .  pioglitazone (ACTOS) 15 MG tablet, Take 15 mg by mouth daily., Disp: , Rfl:  .  pravastatin (PRAVACHOL) 40 MG tablet, Take 1 tablet (40 mg total) by mouth every evening., Disp: 90 tablet, Rfl: 1 .  verapamil (CALAN) 120 MG tablet, Take 60 mg by mouth 2 (two) times daily. , Disp: , Rfl:       Objective:   Vitals:   07/31/18 1509  BP: 132/72  Pulse: 86  SpO2: 96%  Weight: 187 lb 9.6 oz (85.1 kg)  Height: 5' 5.5" (1.664 m)    Estimated body mass index is 30.74 kg/m as calculated from the following:   Height as of this encounter: 5' 5.5" (1.664 m).   Weight as of this encounter: 187 lb 9.6 oz (85.1 kg).  @WEIGHTCHANGE @  [employer]   07/31/18 1509  Weight: 187 lb 9.6 oz (85.1 kg)     Physical Exam  General Appearance:    Alert, cooperative, no distress, appears stated age - yes , Deconditioned looking - yes , OBESE  - y, Sitting on Wheelchair -  Yes for dyspnea  Head:    Normocephalic, without obvious abnormality, atraumatic  Eyes:  PERRL, conjunctiva/corneas clear,  Ears:    Normal TM's and external ear canals, both ears  Nose:   Nares normal, septum midline, mucosa normal, no drainage    or sinus tenderness. OXYGEN ON  - no . Patient is @ ra   Throat:   Lips, mucosa, and tongue normal; teeth and gums normal. Cyanosis on lips - no  Neck:   Supple, symmetrical, trachea midline, no adenopathy;    thyroid:  no enlargement/tenderness/nodules; no carotid   bruit or JVD  Back:     Symmetric, no curvature, ROM normal, no CVA tenderness  Lungs:     Distress - no , Wheeze no, Barrell Chest - no, Purse lip breathing - no, Crackles - yes   Chest Wall:    No tenderness or deformity.    Heart:    Regular rate and rhythm, S1 and S2 normal, no  rub   or gallop, Murmur - no  Breast Exam:    NOT DONE  Abdomen:     Soft, non-tender, bowel sounds active all four quadrants,    no masses, no organomegaly. Visceral obesity - yes  Genitalia:   NOT DONE  Rectal:   NOT DONE  Extremities:   Extremities - normal, Has Cane - no, Clubbing - no, Edema - no  Pulses:   2+ and symmetric all extremities  Skin:   Stigmata of Connective Tissue Disease - no  Lymph nodes:   Cervical, supraclavicular, and axillary nodes normal  Psychiatric:  Neurologic:   Pleasant - yes, Anxious - yes, Flat affect - no  CAm-ICU - neg, Alert and Oriented x 3 - yes, Moves all 4s - yes, Speech - normal, Cognition - intact           Assessment:       ICD-10-CM   1. ILD (interstitial lung disease) ([place]) J84.9   2. Mass of upper lobe of left lung R91.8   3. Mediastinal adenopathy R59.0   4. Abnormal PET of left lung R94.2   5. Preop respiratory exam Z01.811        Preop resp risk eval for lobectomy   Preop fev1 1.44L/67% - Postop fev1 following LUL lobectomy - > 1.14L /53% - Post op DLCO follpwing LUL Lobectomy ->  8.87/33.4% - CPST 07/22/18 -> Exercise testing with gas exchange demonstrates a mildly reduced peak VO2 of 10.2 ml/kg/min (75% of the age/gender/weight matched sedentary norms). The RER of 1.14 indicates a maximal effort. When adjusted to the patient's ideal body weight of 144 lb (65.3 kg) the peak VO2 is 13.3 ml/kg (ibw)/min (83% of the ibw-adjusted predicted   Plan:     Patient Instructions  ILD (interstitial lung disease) ([place])  - progressive - like to address Rx options depending on outcome of surgical evaluation of below - ideally like to have lung biopsy for ILD as well -the type of biopsy either bronchoscopy with genomic classifier or surgical lung biopsy can be decided based on how we addressed the issue of the lung mass and mediastinal adenopathy.  Mass of upper lobe of left lung Mediastinal adenopathy Abnormal PET of left  lung Preop respiratory exam  - reviewed CPST test results 07/22/2018 -I personally believe she is at intermediate risk for complications following lung resection.  It is a little more complicated than resecting a patient with COPD because she is got progressive ILD and in addition there is always concerned that surgical resection of the lung can flareup the ILD based on anecdotal reports  although this might not necessarily be true.  If there is associated circulatory impairment and this can be improved upon then she would be a better candidate handle resection.  However if there is no circulatory lesion that can be fixed and her VO2 might be improved upon then she should at least undergo diagnostic work-up through bronchoscopy and endobronchial ultrasound so that we can get a diagnosis.    - have sent message to Dr C to see if he wants to consider cardiac tests based on bike test results - based on Dr C input will ask Dr Roxan Hockey if he thinks you are safe candidate for resection  Followup 6-8 weeks ILD clinic; walk test on RA at followup (not 40md)    After long conversation she and her husband understanding   > 50% of this > 25 min visit spent in face to face counseling or coordination of care - by this undersigned MD - Dr MBrand Males This includes one or more of the following documented above: discussion of test results, diagnostic or treatment recommendations, prognosis, risks and benefits of management options, instructions, education, compliance or risk-factor reduction   SIGNATURE    Dr. MBrand Males M.D., F.C.C.P,  Pulmonary and Critical Care Medicine Staff Physician, CDoradoDirector - Interstitial Lung Disease  Program  [place] [address] Pager: [phone] If no answer or between  15:00h - 7:00h: call [phone] Telephone: [phone]  5:58 PM 07/31/2018

## 2018-07-31 NOTE — Telephone Encounter (Signed)
Hi Mihai  Shamari A Karen had CPST - DAn B thinsks there is circulatory limitation. Please see and do the needful as you see fit. If you are not going to do cardiac workup then I can let Roxan Hockey know about risk for lung resection  Thanks  M

## 2018-07-31 NOTE — Patient Instructions (Addendum)
ILD (interstitial lung disease) ([place])  - progressive - like to address Rx options depending on outcome of surgical evaluation of below - ideally like to have lung biopsy for ILD as well   Mass of upper lobe of left lung Mediastinal adenopathy Abnormal PET of left lung Preop respiratory exam  - reviewed CPST test results 07/22/2018 - have sent message to Dr C to see if he wants to consider cardiac tests based on bike test results - based on Dr C input will ask Dr Roxan Hockey if he thinks you are safe candidate for resection  Followup 6-8 weeks ILD clinic; walk test on RA at followup (not 98mwd)

## 2018-08-01 ENCOUNTER — Telehealth: Payer: Self-pay | Admitting: *Deleted

## 2018-08-01 NOTE — Telephone Encounter (Signed)
Spoke with pt, reviewed results with patient. She is on the verapamil because of headaches but is willing to try the amlodipine. She would like to wait to make any changes after her surgery. She will give [place] a call after surgery to get the new prescription. All questions answered.   Notes recorded by Sanda Klein, MD on 08/01/2018 at 12:23 PM EST Multiple factors are contributing to her exercise limitation. Lung disease, chronotropic incompetence (inability to increase the heart rate sufficiently with exercise), maybe also some evidence of underlying heart disease. There were no ECG changes during exercise that would suggest severe coronary blockages (although she only reached 80% of the maximum predicted heart rate). Overall, I do not think there is any reason to delay her surgery any further. I do not think a cardiac catheterization is necessary at this time.  - To prevent disease progression, it is very important to aggressively lower the LDL cholesterol (target less than 70). We increased the statin at her last appointment and I suspect it will get [place] there. - Because of the chronotropic incompetence, she might feel better taking amlodipine 5 mg daily rather than verapamil. I recommend that she makes that change. If she agrees, we will send in the appropriate prescription. - After her surgery, would recommend she take aspirin 81 mg daily.

## 2018-08-29 ENCOUNTER — Telehealth: Payer: Self-pay | Admitting: Internal Medicine

## 2018-08-29 NOTE — Telephone Encounter (Signed)
Spoke with the pt  She states that her spouse is having a biopsy next wk and she is concerned that he will bring home germs  I advised that she should practice good hand hygiene and stay away from anyone who is sick, needs to stay home and isolate  She verbalized understanding and wants to know if MR has any other recs for her  She would like for him to call and speak with her directly  She is aware of his return date to the office

## 2018-09-02 ENCOUNTER — Ambulatory Visit: Payer: Medicare Other | Admitting: Thoracic Surgery (Cardiothoracic Vascular Surgery)

## 2018-09-02 NOTE — Telephone Encounter (Signed)
[name]: your advice is what I would give  As to me calling: I wish I can but right now lot of front line work  For the pandemic and I wished I can call and talk to her. Wish her my best

## 2018-09-02 NOTE — Telephone Encounter (Signed)
Called and spoke with patient regarding MR recommendations below Advised pt to use good hygiene, good hand washing and take extra measure to be germ free Pt advised that she understood MR recommendations She advised that she will stay home, and be ready to clean up pt once home Pt expressed and verbalized understanding Nothing further needed

## 2018-09-20 ENCOUNTER — Other Ambulatory Visit: Payer: Self-pay | Admitting: Cardiovascular Disease

## 2018-11-16 ENCOUNTER — Other Ambulatory Visit: Payer: Self-pay | Admitting: Cardiovascular Disease

## 2018-11-24 ENCOUNTER — Telehealth: Payer: Self-pay | Admitting: Thoracic Surgery (Cardiothoracic Vascular Surgery)

## 2018-11-24 NOTE — Telephone Encounter (Signed)
Office contacted [name]. She refused a follow up appointment.  I called her to discuss the issues.   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   I stressed the importance of looking after her own health. She does not want to have any surgical procedures as she can't leave her husband  I discussed an alternative of a CT guided needle biopsy and radiation.  She will consider it and talk with her family. She will let us know if she would like to pursue a workup.   She will need a new CT chest if she decides to move forward  Remo Lipps C. Roxan Hockey, MD [facility] [phone]

## 2019-01-12 ENCOUNTER — Other Ambulatory Visit: Payer: Self-pay | Admitting: Cardiovascular Disease

## 2019-01-15 ENCOUNTER — Other Ambulatory Visit: Payer: Self-pay | Admitting: Cardiovascular Disease

## 2019-04-25 ENCOUNTER — Other Ambulatory Visit: Payer: Self-pay | Admitting: Cardiovascular Disease

## 2019-07-15 ENCOUNTER — Telehealth: Payer: Self-pay | Admitting: Internal Medicine

## 2019-07-15 ENCOUNTER — Other Ambulatory Visit: Payer: Self-pay

## 2019-07-15 ENCOUNTER — Ambulatory Visit (INDEPENDENT_AMBULATORY_CARE_PROVIDER_SITE_OTHER): Payer: Medicare Other | Admitting: Internal Medicine

## 2019-07-15 DIAGNOSIS — R053 Chronic cough: Secondary | ICD-10-CM

## 2019-07-15 DIAGNOSIS — Z7185 Encounter for immunization safety counseling: Secondary | ICD-10-CM

## 2019-07-15 DIAGNOSIS — Z7189 Other specified counseling: Secondary | ICD-10-CM

## 2019-07-15 DIAGNOSIS — R05 Cough: Secondary | ICD-10-CM

## 2019-07-15 DIAGNOSIS — Z87891 Personal history of nicotine dependence: Secondary | ICD-10-CM

## 2019-07-15 DIAGNOSIS — R918 Other nonspecific abnormal finding of lung field: Secondary | ICD-10-CM

## 2019-07-15 DIAGNOSIS — J849 Interstitial pulmonary disease, unspecified: Secondary | ICD-10-CM

## 2019-07-15 DIAGNOSIS — R590 Localized enlarged lymph nodes: Secondary | ICD-10-CM

## 2019-07-15 MED ORDER — HYDROCODONE-HOMATROPINE 5-1.5 MG/5ML PO SYRP: 5.0000 mL | ORAL_SOLUTION | Freq: Every day | ORAL | 0 refills | Status: DC | PRN

## 2019-07-15 NOTE — Telephone Encounter (Signed)
Called spoke with patient who stated that she had just finished speaking with Dr [name] himself and there is nothing further needed.  Will sign off.

## 2019-07-15 NOTE — Progress Notes (Addendum)
07/24/17 0915  [facility] ILD questionnaire  Symptoms: Shortness of breath began gradually.  Patient has had shortness of breath for the last 12 years but has worsened over the last 3 to 4 weeks.  Cough started 11 to 12 years ago and it is still the same.  It is a moderate cough.  Do not cough at night.  To bring up phlegm.  Phlegm color is clear. Past medical history: Type 2 diabetes.  Thyroid disease.  One episode of pneumonia.  One episode of pleurisy Review of symptoms: Patient endorses: Fatigue, occasional joint stiffness, nausea, heartburn Family history: Patient denies any family history to pulmonary fibrosis, COPD, asthma, sarcoidosis, cystic fibrosis, hypersensitivity pneumonitis, autoimmune diseases. Exposure history: Former tobacco smoker quit 1977.  45-pack-year smoking history.  Denies cigar, pipe, vaping.  Denies non-tobacco use or illicit drug use. Home and hobby details: Single-family home.  [place] residence.  Have lived at home since 1975.  Age of home is 40.5 years.  This is not a damp living environment.  There is multi-mildew in shower curtain.  Bathroom does have mold and mildew.  Patient does not use humidifier or CPAP or nebulizer.  Patient does not seem iron.  Patient does not have a Jacuzzi or misting found.  Patient did have a pet parakeet as a teenager.  Patient does not have any pet gerbils hamsters rabbits or rodents in the house.  Patient does have feather pillows but there are 3 pillows not to sleep on.  There is not mold in her [facility] duct system.  She does not play any wind instruments.  Patient does like to garden (flowers).  Patient does have mulch in her outdoor gardens.  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  Patient had a parakeet as a teenager.  Patient patient has a feather duvet.  Patient does paint.  Patient sews.   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  Patient has cats and dogs at home. Medication history: Patient denies any medication exposures.  Serology - dec 2019 - negative, trace positie RNP and RF  OV 07/01/2018  Subjective:  Patient ID: [name], female , DOB: 1940/10/29 , age 79 y.o. , MRN: [id number] , ADDRESS: [place] [id number]   07/01/2018 -   Chief Complaint  Patient presents with  . Follow-up    Pt states she is about the same since last visit. States she still has some SOB, has an occ cough with clear mucus. Denies any CP.   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 -   Presents for follow-up of the above issues.  I personally not seen her since 2016.  Back then because of her indeterminate nature of her ILD and offered surgical lung biopsy but she had refused.  Then she was lost to follow-up.  She tells me that around Thanksgiving 2019 she developed a viral infection and after  that she has had worsening shortness of breath and respiratory symptoms.  She slowly improved since then.  She says she still needs oxygen with walking.  She is working with physical therapy.  In fact walking desaturation test 185 feet x 3 laps on room air: She was unable to do 1 lap.  She had good gait.  She desaturated to 88% and had to stop.  This then resulted in recovery of oxygen.  Review of her scans show that even in 2018 her ILD was getting worse.  Subsequent scan that I personally visualized and done in December 2019 that showed progressive ILD  since 2015/2016.  I agree with the radiologist that its not consistent with a UIP pattern with upper lobe disease and more honeycombing in the upper lobe with air trapping.  It is very suspicious for indeterminate or alternative diagnostic category on radiology for 2018 ATS criteria.  The alternative diagnosis on radiology that would fit in most of this disease would be hypersensitive pneumonitis.  Review of her ILD questionnaire done in December 2019 by nurse practitioner shows she is a high risk candidate December 2019 and mold exposure the house.  The other consideration is autoimmune interstitial lung disease but her serology have been waxing and waning and most recently nearly negative  The other issue was the discovery of a left upper lobe lung mass greater than 2 cm.  She had a PET scan that shows this to be PET active.  Is also possible reactive mediastinal adenopathy.  She met with Dr. June Leap and was offered an navigational bronchoscopy with endobronchial ultrasound.  She needed some time to think about this because she felt overwhelmed.  She was not ready to do this last week.  After this the schedule was not available.  Therefore the procedure was not done.  She is telling me today that her husband sister 1 is [name] might in 2014 had surgical lung resection of stage I adenocarcinoma by Dr. Erasmo [name] and is doing really well.  [I reviewed the chart and confirmed the same].  She is wanting to see Dr. Erasmo [name].  I reminded her that this was the original plan in 2015/2016 to get lung biopsy.  For her ILD     ROS  OV 07/31/2018  Subjective:  Patient ID: [name], female , DOB: 19-Sep-1940 , age 36 y.o. , MRN: [id number] , ADDRESS: [place] [id number]   07/31/2018 -   Chief Complaint  Patient presents with  . Follow-up    PFT performed 1/27. Pt did have consultation with Dr. Roxan Hockey 1/29.  Pt had a CPST 2/11. Pt states she is about the same as  last visit and states she still becomes SOB when she exerts herself, has an occ cough with clear mucus. Denies any complaints of CP or chest tightness.   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 -returns for follow-up to discuss ongoing work-up for the above.  After my last visit she did see Dr. Erasmo [name] with thoracic surgery.  He was worried about surgical risk with left upper lobe lobectomy.  Also she had coronary artery calcification.  She saw Dr. see in cardiology on July 18, 2018.  He wanted to see the results of the CPS to before he decided whether he would do  further cardiac testing.  She has not had an echocardiogram at this point.  Then on July 22, 2018 she underwent cardiopulmonary stress testing.  In a nutshell the final report was believed that she had a mixture of circulatory and respiratory impairment.  Interestingly despite her ILD she only desaturated to 89% on a full maximal stress test. Exercise testing with gas exchange demonstrates a mildly reduced peak VO2 of 10.2 ml/kg/min (75% of the age/gender/weight matched sedentary norms). The RER of 1.14 indicates a maximal effort. When adjusted to the patient's ideal body weight of 144 lb (65.3 kg) the peak VO2 is 13.3 ml/kg (ibw)/min (83% of the ibw-adjusted predicted.  At this point in time she does not have follow-up with either Dr. Loletha Grayer or Dr. Roxan Hockey.  Her understanding is that they will review these results and then get back to her about surgical risk.  It appears that she is quite keen on getting the lung mass resected.  In terms of the ILD she had pulmonary function testing that shows progressive ILD.  She continues have significant shortness of breath and cough.  She is here with her husband to discuss all this.  She keeps asking circular questions about her  ILD and lung mass and treatment options.       OV 07/15/2019  Subjective:  Patient ID: [name], female , DOB: 1941-02-19 , age 78 y.o. 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  Patient identified with 2 person identifier.  Risks, benefits and limitations of telephone visit explained.  HPI Tenicia Gural Harold 79 y.o. -has been a year since his last saw patient.  In the interim overall she is stable although the cough is deteriorated significantly.  It is severe.  Over-the-counter medications are not helping.  She want something stronger.  She has had hydrocodone before for something else and has tolerated it well.  She has a lot of questions about getting Covid vaccine because she has history of allergies to several medications including flu shot and penicillin.  Upon closer questioning the seem like side effects of these medications which she sees numerous allergies.  She says primary care has deferred the decision on Covid vaccine to me.  I explained to her that with Covid vaccine with a history of other allergies this 8 fold high risk of getting anaphylaxis but overall risk is still less than 2%.  Based on this she is going to defer getting Covid vaccine.  She is fully aware that there is the Covid monoclonal antibody treatment to treat her acute Covid less than 10 days of symptoms and high risk individuals.  She is opted for that plan.  Of note in terms of her mediastinal adenopathy and her mass of the left upper lobe and interstitial lung disease she is completely not had any follow-up in 1 year.  She is not had biopsy.  This because her husband deceased.  In addition she has a cat who is diabetic.  Therefore she does not want to leave  the house in addition the COVID-19 pandemic has waiting into her social distancing and isolation.  In fact she does not want to come in to get a CT scan for at least another  few months.  She is asking for palliative relief of her severe cough.       SYMPTOM SCALE - ILD 07/31/2018   O2 use RA*  Shortness of Breath 0 -> 5 scale with 5 being worst (score 6 If unable to do)  At rest 0  Simple tasks - showers, clothes change, eating, shaving 3.5  Household (dishes, doing bed, laundry) 4  Shopping unable  Walking level at own pace 3  Walking keeping up with others of same age unable  Walking up Stairs unable  Walking up [place] unable  Total (40 - 48) Dyspnea Score 34.5  How bad is your cough? 5  How bad is your fatigue 4       Results for Grine, UCHENNA SEUFERT (MRN [id number]) as of 07/31/2018 15:57  Ref. Range 10/30/2013 10:43 07/15/2014 15:43 07/07/2018 15:50  FVC-Pre Latest Units: L 2.41 2.44 1.67  FVC-%Pred-Pre Latest Units: % 79 81 58   Results for Roediger, Lovene A (MRN [id number]) as of 07/31/2018 17:56  Ref. Range 07/07/2018 15:50  FEV1-Pre Latest Units: L 1.44  FEV1-%Pred-Pre Latest Units: % 67    Results for Fils, Leonela A (MRN [id number]) as of 07/31/2018 15:57  Ref. Range 10/30/2013 10:43 07/15/2014 15:43 07/07/2018 15:50  DLCO unc Latest Units: ml/min/mmHg 15.85 15.61 11.23  DLCO unc % pred Latest Units: % 61 60 43       ROS - per HPI     has a past medical history of Bronchiectasis, Chronic diarrhea, Chronic rhinitis, Diabetes mellitus ([place]), Hyperplastic colon polyp, IBS (irritable bowel syndrome), ILD (interstitial lung disease) ([place]), Stenosis of rectum and anus, and Thyroid disease.   reports that she quit smoking about 43 years ago. Her smoking use included cigarettes. She has a 45.00 pack-year smoking history. She has never used smokeless tobacco.  Past Surgical History:  Procedure Laterality Date  .  gallbaldder    . CHOLECYSTECTOMY    . DILATION AND CURETTAGE OF  UTERUS    . HAND SURGERY  1991   left hand; Baker's Cyst removed  . migraine    . tear duct surgery      Allergies  Allergen Reactions  . Penicillins Diarrhea    Has patient had a PCN reaction causing immediate rash, facial/tongue/throat swelling, SOB or lightheadedness with hypotension: Unknown Has patient had a PCN reaction causing severe rash involving mucus membranes or skin necrosis: Unknown Has patient had a PCN reaction that required hospitalization: No  Has patient had a PCN reaction occurring within the last 10 years: No  If all of the above answers are "NO", then may proceed with Cephalosporin use.   . Influenza Vaccines Other (See Comments)    Severe nausea, vomiting and body aches-md told her not to take it anymore  . Clarithromycin     Unknown reaction   . Cortisone Other (See Comments)    "Made her feel like ice water was running through her veins"   . Povidone-Iodine     Unknown reaction     Immunization History  Administered Date(s) Administered  . Influenza Split 05/11/2014    Family History  Problem Relation Age of Onset  . Colon polyps Sister   . Heart disease Sister   . Diabetes Sister   . Heart disease Mother   . Colon cancer Neg Hx      Current Outpatient Medications:  .  acetaminophen (TYLENOL) 500 MG tablet, Take 1,000 mg by mouth at bedtime., Disp: , Rfl:  .  Ascorbic Acid (VITAMIN C) 1000 MG tablet, Take 1,000 mg by mouth at bedtime. , Disp: , Rfl:  .  benzonatate (TESSALON) 100 MG capsule, TAKE 1 CAPSULE (100 MG TOTAL) BY MOUTH EVERY 8 (EIGHT) HOURS AS NEEDED FOR COUGH., Disp: 30 capsule, Rfl: 1 .  Biotin 10000 MCG TABS, Take 10,000 mcg by mouth daily. , Disp: , Rfl:  .  Calcium-Magnesium-Vitamin D (CALCIUM 1200+D3 PO), Take 1 tablet by mouth at bedtime., Disp: , Rfl:  .  clorazepate (TRANXENE) 3.75 MG tablet, TAKE 1 TABLET BY MOUTH DAILY.MAY TAKE UP TO 2 DAILY IF NEEDED (Patient taking differently: Take 3.75 mg by mouth daily. ), Disp: 60  tablet, Rfl: 1 .  cyclobenzaprine (FLEXERIL) 10 MG tablet, Take 10 mg by mouth at bedtime. , Disp: , Rfl:  .  Gluc-Chonn-MSM-Boswellia-Vit D (GLUCOSAMINE CHOND TRIPLE/VIT D) TABS, Take 1 tablet by mouth 2 (two) times daily., Disp: , Rfl:  .  levothyroxine (SYNTHROID, LEVOTHROID) 88 MCG tablet, Take 88 mcg by mouth daily before breakfast. , Disp: , Rfl:  .  Multiple Vitamin (MULTIVITAMIN) tablet, Take 1 tablet by mouth daily.  , Disp: , Rfl:  .  pioglitazone (ACTOS) 15 MG tablet, Take 15 mg by mouth daily., Disp: , Rfl:  .  pravastatin (PRAVACHOL) 40 MG tablet, Take 1 tablet (40 mg total) by mouth every evening. Please make annual appt in February for future refills. Thank you, Disp: 90 tablet, Rfl: 0 .  verapamil (CALAN) 120 MG tablet, Take 60 mg by mouth 2 (two) times daily. , Disp: , Rfl:       Objective:   There were no vitals filed for this visit.  Estimated body mass index is 30.74 kg/m as calculated from the following:   Height as of 07/31/18: 5' 5.5" (1.664 m).   Weight as of 07/31/18: 187 lb 9.6 oz (85.1 kg).  @WEIGHTCHANGE @  There were no vitals filed for this visit.   Physical Exam Sounded normal         Assessment:       ICD-10-CM   1. ILD (interstitial lung disease) ([place])  J84.9   2. Mass of upper lobe of left lung  R91.8   3. Mediastinal adenopathy  R59.0   4. Chronic cough  R05   5. Vaccine counseling  Z71.89        Plan:     Patient Instructions  ILD (interstitial lung disease) ([place])  - progressive but possibly stable since early 2020 based on history  plan - respect decision to defer biopsy due to husband death and cat being alone at home - repeat HRCT in 3 months  Vaccine counseling - new - support deferring covid vaccine due to history of allergies to many medications and ijnectiables - instead opt for Mab infusion if you end up with covid  Mass of upper lobe of left lung Mediastinal adenopathy Abnormal PET of left lung Preop respiratory  exam  - respect decision to defer biopsy due to above reasons  Plan  - repeat HRCT in 3 months  Severe cough - new/worse  - due to above  Plan  - hycodan cough syrup 56m once daily as needed - palliative measure  Followup 3 months but after HRCT For face to face encounter     SIGNATURE    Dr. MBrand Males M.D., F.C.C.P,  Pulmonary and Critical Care Medicine Staff Physician, CNew BrocktonDirector - Interstitial Lung Disease  Program  [facility]  at [facility], [place], [address]  Pager: [phone], If no answer or between  15:00h - 7:00h: call [phone] Telephone: [phone]  1:12 PM 07/15/2019

## 2019-07-15 NOTE — Telephone Encounter (Signed)
Pt calling about her televisit appt that was scheduled for 11:15 am with Dr. [name].

## 2019-07-15 NOTE — Addendum Note (Signed)
Addended by: Lorretta Harp on: 07/15/2019 01:16 PM   Modules accepted: Orders

## 2019-07-15 NOTE — Addendum Note (Signed)
Addended by: Brand Males on: 07/15/2019 01:19 PM   Modules accepted: Orders

## 2019-07-15 NOTE — Patient Instructions (Addendum)
ILD (interstitial lung disease) ([place])  - progressive but possibly stable since early 2020 based on history  plan - respect decision to defer biopsy due to husband death and cat being alone at home - repeat HRCT in 3 months  Vaccine counseling - new - support deferring covid vaccine due to history of allergies to many medications and ijnectiables - instead opt for Mab infusion if you end up with covid  Mass of upper lobe of left lung Mediastinal adenopathy Abnormal PET of left lung Preop respiratory exam  - respect decision to defer biopsy due to above reasons  Plan  - repeat HRCT in 3 months  Severe cough - new/worse  - due to above  Plan  - hycodan cough syrup 70mL once daily as needed - palliative measure  Followup 3 months but after HRCT For face to face encounter

## 2019-07-30 ENCOUNTER — Other Ambulatory Visit: Payer: Self-pay | Admitting: Cardiovascular Disease

## 2019-08-04 ENCOUNTER — Telehealth: Payer: Medicare Other | Admitting: Cardiovascular Disease

## 2019-08-10 ENCOUNTER — Telehealth: Payer: Self-pay | Admitting: Internal Medicine

## 2019-08-10 NOTE — Telephone Encounter (Signed)
[name] is not listed on the patient DPR. Only her husband [name] [phone]).  Dr. [name] discussed this with the patient based on office visit note dated 07/15/19.  "Vaccine counseling - new - support deferring covid vaccine due to history of allergies to many medications and ijnectiables - instead opt for Mab infusion if you end up with covid"  Nothing further needed at this time.

## 2019-08-14 ENCOUNTER — Other Ambulatory Visit: Payer: Self-pay | Admitting: Cardiovascular Disease

## 2019-08-14 ENCOUNTER — Encounter: Payer: Self-pay | Admitting: Cardiovascular Disease

## 2019-08-14 ENCOUNTER — Telehealth (INDEPENDENT_AMBULATORY_CARE_PROVIDER_SITE_OTHER): Payer: Medicare Other | Admitting: Cardiovascular Disease

## 2019-08-14 DIAGNOSIS — E7800 Pure hypercholesterolemia, unspecified: Secondary | ICD-10-CM

## 2019-08-14 DIAGNOSIS — I10 Essential (primary) hypertension: Secondary | ICD-10-CM

## 2019-08-14 DIAGNOSIS — I2510 Atherosclerotic heart disease of native coronary artery without angina pectoris: Secondary | ICD-10-CM

## 2019-08-14 DIAGNOSIS — J849 Interstitial pulmonary disease, unspecified: Secondary | ICD-10-CM | POA: Diagnosis not present

## 2019-08-14 NOTE — Patient Instructions (Signed)

## 2019-08-14 NOTE — Progress Notes (Signed)
Cardiology Office Note:    Date:  08/15/2019   ID:  [name], DOB 1941/01/11, MRN [id number]  PCP:  [name], MD  Cardiologist:  Sanda Klein, MD  Electrophysiologist:  None   Referring MD: [name], MD   No chief complaint on file.   History of Present Illness:    [name] is a 79 y.o. female with a hx of interstitial lung disease, followed by Dr. [name], incidentally found to have extensive coronary artery calcification on chest CT.  She has hyperlipidemia and type 2 diabetes mellitus and treated essential hypertension.  She does not have angina pectoris.  Cardiopulmonary stress testing in February 2020 showed abnormal but low risk findings and evidence of impaired exercise capacity due to both pulmonary and cardiac issues including a mild degree of chronotropic incompetence.  Peak VO2 was roughly 75 percentile.  She continues to deny angina pectoris.  She continues to have shortness of breath, which has been a problem for about 12 years, and has NYHA functional class III shortness of breath (at times she becomes short of breath with bathing and clothing).  Mostly she feels poorly when she coughs, but between bouts of coughing she does reasonably well.  Oxygen saturations are 97-99% on room air at home.  Her typical heart rate is in the 70s.  She denies palpitations, dizziness, syncope, leg edema, claudication, orthopnea or PND.  Unfortunately, her husband [name] who is also my patient died in 01-May-2023 from metastatic cancer.  She is still grieving his loss.  They were married for almost 60 years.  She quit smoking about in 1977 but has a 45-pack-year history of smoking.  Her mother died at age 7 of a myocardial infarction.  She has mild hyperlipidemia with a cholesterol is 86 on a low-dose of pravastatin.  She has type II diabetes with a hemoglobin A1c of 6%.  She does not have known peripheral carotid artery disease.  She has well treated hypertension.  Her  work-up shows metabolic evidence of active inflammation in her lungs.  She has a mildly positive ANA at 1: 160 p-ANCA positive at 1: 640.  She has negative PR-3 and myeloperoxidase antibodies, negative CCP, rheumatoid factor, scleroderma 70.  Active treatment was recommended in 2016 although she declined.  Chest x-ray shows progression in interstitial pulmonary fibrosis in the last few years.  She has recently received high-dose steroids.  Currently weaned off steroids.  She developed hypoxia with light activity.  She uses oxygen as needed.  She was referred to Dr. Roxan Hockey for a hypermetabolic lung nodule in the left upper lobe.  The mass measures a maximum diameter of 3.3 cm has internal air bronchograms and is enlarging.  Dr. Roxan Hockey expressed great concern that she would not be a surgical candidate due to the underlying lung disease.  He offered electromagnetic navigational bronchoscopy with endobronchial ultrasound and possible mediastinoscopy but she was uncertain whether to go ahead with this.  Pulmonary function test performed on June 30, 2018 show FVC 1.7 L (58% predicted) and DLCO 43% of predicted.  Past Medical History:  Diagnosis Date  . Bronchiectasis    PFTs August 23, 2010 VC 82%   dlco 62 > 129 CORRECTED  . Chronic diarrhea   . Chronic rhinitis    sinus CT ordered August 23, 2010  . Diabetes mellitus ([place])   . Hyperplastic colon polyp   . IBS (irritable bowel syndrome)   . ILD (interstitial lung disease) ([place])   .  Stenosis of rectum and anus   . Thyroid disease     Past Surgical History:  Procedure Laterality Date  .  gallbaldder    . CHOLECYSTECTOMY    . DILATION AND CURETTAGE OF UTERUS    . HAND SURGERY  1991   left hand; Baker's Cyst removed  . migraine    . tear duct surgery      Current Medications: Current Meds  Medication Sig  . acetaminophen (TYLENOL) 500 MG tablet Take 1,000 mg by mouth at bedtime.  . Ascorbic Acid (VITAMIN C) 1000 MG tablet Take  1,000 mg by mouth at bedtime.   . benzonatate (TESSALON) 100 MG capsule TAKE 1 CAPSULE (100 MG TOTAL) BY MOUTH EVERY 8 (EIGHT) HOURS AS NEEDED FOR COUGH.  . Biotin 10000 MCG TABS Take 10,000 mcg by mouth daily.   . Calcium-Magnesium-Vitamin D (CALCIUM 1200+D3 PO) Take 1 tablet by mouth at bedtime.  . clorazepate (TRANXENE) 3.75 MG tablet TAKE 1 TABLET BY MOUTH DAILY.MAY TAKE UP TO 2 DAILY IF NEEDED (Patient taking differently: Take 3.75 mg by mouth daily. )  . cyclobenzaprine (FLEXERIL) 10 MG tablet Take 10 mg by mouth at bedtime.   . Gluc-Chonn-MSM-Boswellia-Vit D (GLUCOSAMINE CHOND TRIPLE/VIT D) TABS Take 1 tablet by mouth 2 (two) times daily.  [name] HYDROcodone-homatropine (HYCODAN) 5-1.5 MG/5ML syrup Take 5 mLs by mouth daily as needed for cough.  . levothyroxine (SYNTHROID, LEVOTHROID) 88 MCG tablet Take 88 mcg by mouth daily before breakfast.   . Multiple Vitamin (MULTIVITAMIN) tablet Take 1 tablet by mouth daily.    . pioglitazone (ACTOS) 15 MG tablet Take 15 mg by mouth daily.  . verapamil (CALAN) 120 MG tablet Take 60 mg by mouth 2 (two) times daily.   . [DISCONTINUED] pravastatin (PRAVACHOL) 40 MG tablet Take 1 tablet (40 mg total) by mouth every evening. Please make annual appt in February for future refills. Thank you     Allergies:   Penicillins, Influenza vaccines, Clarithromycin, Cortisone, and Povidone-iodine   Social History   Socioeconomic History  . Marital status: Married    Spouse name: Not on file  . Number of children: Not on file  . Years of education: Not on file  . Highest education level: Not on file  Occupational History  . Occupation: retired  Tobacco Use  . Smoking status: Former Smoker    Packs/day: 3.00    Years: 15.00    Pack years: 45.00    Types: Cigarettes    Quit date: 06/11/1976    Years since quitting: 43.2  . Smokeless tobacco: Never Used  Substance and Sexual Activity  . Alcohol use: No  . Drug use: No  . Sexual activity: Not on file  Other  Topics Concern  . Not on file  Social History Narrative   Daily caffeine use   Social Determinants of Health   Financial Resource Strain:   . Difficulty of Paying Living Expenses: Not on file  Food Insecurity:   . Worried About Charity fundraiser in the Last Year: Not on file  . Ran Out of Food in the Last Year: Not on file  Transportation Needs:   . Lack of Transportation (Medical): Not on file  . Lack of Transportation (Non-Medical): Not on file  Physical Activity:   . Days of Exercise per Week: Not on file  . Minutes of Exercise per Session: Not on file  Stress:   . Feeling of Stress : Not on file  Social Connections:   .  Frequency of Communication with Friends and Family: Not on file  . Frequency of Social Gatherings with Friends and Family: Not on file  . Attends Religious Services: Not on file  . Active Member of Clubs or Organizations: Not on file  . Attends Archivist Meetings: Not on file  . Marital Status: Not on file     Family History: The patient's family history includes Colon polyps in her sister; Diabetes in her sister; Heart disease in her mother and sister. There is no history of Colon cancer.  ROS:   Please see the history of present illness.     All other systems reviewed and are negative.  EKGs/Labs/Other Studies Reviewed:    The following studies were reviewed today: Notes from PCP and from pulmonology , radiological studies, serologies  EKG:  EKG is  ordered today.  The ekg ordered today demonstrates normal sinus rhythm, left anterior fascicular block and poor R wave progression, no ischemic repolarization abnormalities  Recent Labs: No results found for requested labs within last 8760 hours.  Recent Lipid Panel No results found for: CHOL, TRIG, HDL, CHOLHDL, VLDL, LDLCALC, LDLDIRECT 07/22/2019 Total cholesterol 119, HDL 40, LDL 58, triglycerides 111 Hemoglobin A1c 6.1% Hemoglobin 11.5 Creatinine 0.66, potassium 3.9, TSH 2.11, normal  liver function tests Physical Exam:    VS:  Ht 5' 5.5" (1.664 m)   Wt 160 lb (72.6 kg)   BMI 26.22 kg/m     Wt Readings from Last 3 Encounters:  08/14/19 160 lb (72.6 kg)  07/31/18 187 lb 9.6 oz (85.1 kg)  07/18/18 189 lb (85.7 kg)    Vital signs reviewed, unable to examine  ASSESSMENT:    1. Coronary artery calcification seen on CAT scan   2. Essential hypertension   3. Hypercholesterolemia   4. ILD (interstitial lung disease) ([place])    PLAN:    In order of problems listed above:  1. CAD: I do not think should be a candidate for surgical revascularization, even if we did identify extensive CAD.  She does not have angina pectoris and therefore is unlikely to benefit from percutaneous revascularization.  She has not had an echocardiogram to establish LVEF.  Asymptomatic coronary calcifications were incidentally identified on chest CT.  She has numerous coronary risk factors that include type 2 diabetes, hyperlipidemia, treated hypertension and a family history of early onset CAD in a female relative.  It is more likely that her limitations are related to her chronic lung disease and pulmonary artery hypertension.  We will schedule for an echocardiogram. 2. DM: Good glycemic control.  She is on pioglitazone and does not have evidence of edema or heart failure. 3. HLP: Excellent lipid profile on the current statin regimen. 4. HTN: Excellent control.  5. ILD: This is advanced and precludes many treatments including coronary bypass surgery, should we discover that she has extensive CAD.  Since she does not have angina, cardiac catheterization and percutaneous revascularization would offer little advantage, outside of acute coronary syndrome.  Her cough is a major complaint.   6. 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  Marginal candidate for resection.   Medication Adjustments/Labs and Tests Ordered: Current medicines are reviewed at  length with the patient today.  Concerns regarding medicines are outlined above.  No orders of the defined types were placed in this encounter.  No orders of the defined types were placed in this encounter.   Patient Instructions  Medication Instructions:  No changes *If you need a refill on your cardiac medications before your next appointment, please call your pharmacy*   Lab Work: None ordered If you have labs (blood work) drawn today and your tests are completely normal, you will receive your results only by: [name] MyChart Message (if you have MyChart) OR . A paper copy in the mail If you have any lab test that is abnormal or we need to change your treatment, we will call you to review the results.   Testing/Procedures: None ordered   Follow-Up: At [facility], you and your health needs are our priority.  As part of our continuing mission to provide you with exceptional heart care, we have created designated Provider Care Teams.  These Care Teams include your primary Cardiologist (physician) and Advanced Practice Providers (APPs -  Physician Assistants and Nurse Practitioners) who all work together to provide you with the care you need, when you need it.  We recommend signing up for the patient portal called "MyChart".  Sign up information is provided on this After Visit Summary.  MyChart is used to connect with patients for Virtual Visits (Telemedicine).  Patients are able to view lab/test results, encounter notes, upcoming appointments, etc.  Non-urgent messages can be sent to your provider as well.   To learn more about what you can do with MyChart, go to [email or web address].    Your next appointment:   12 month(s)  The format for your next appointment:   In Person  Provider:   You may see Sanda Klein, MD or one of the following Advanced Practice Providers on your designated Care Team:    Almyra Deforest, PA-C  Fabian Sharp, [place] or   Roby Lofts, PA-C        Signed, Sanda Klein, MD  08/15/2019 2:26 PM    [place]

## 2019-08-19 ENCOUNTER — Telehealth: Payer: Self-pay | Admitting: *Deleted

## 2019-08-19 NOTE — Telephone Encounter (Signed)
Left a message for the patient to call back.  

## 2019-08-19 NOTE — Telephone Encounter (Signed)
-----   Message from Sanda Klein, MD sent at 08/15/2019  2:27 PM EST ----- Please tell her that I spoke with Dr. [name] and we both think she should have an echocardiogram to take a closer look at heart pumping function and try to evaluate for pulmonary hypertension.  We had planned this before, but it never happened.  It is not an emergency.

## 2019-08-25 NOTE — Telephone Encounter (Signed)
Spoke with the patient. She stated at this time she does not want to have the echo completed. She stated that she was advised to not leave the house due to covid and was also advised to not get the vaccine.   She stated that she would think about it and let us know if she decides to have the echo completed. She has a follow up in 2-3 months with Dr. [name] and stated that she will discuss it with him then.

## 2019-08-26 NOTE — Telephone Encounter (Signed)
Ok

## 2019-09-21 ENCOUNTER — Telehealth: Payer: Self-pay | Admitting: Internal Medicine

## 2019-09-21 NOTE — Telephone Encounter (Signed)
Patient son [name] called on Friday to question why Dr. [name] recommended that the patient not get the COVID vaccine. Since he was not on the DPR no information was given to him. Called and spoke to [name] today, he advised that he is the medical POA for the patient. I told him if brought the [place] paperwork, we can make a copy and scan to the patient's chart, have him sign a new DPR to include himself, and other necessary parties. Once the new DPR is complete we can place a message back to our triage department to follow up on his concerns and questions in regards to the patient not getting the COVID vaccine. Adam understood and will bring the POA paperwork to the office at his earliest opportunity. -pr

## 2019-09-28 ENCOUNTER — Telehealth: Payer: Self-pay | Admitting: Internal Medicine

## 2019-09-28 NOTE — Telephone Encounter (Signed)
Per Anabel Halon w/ [facility], pt is refusing to scheduling ordered CT until speaking with MR per the following:  Margretta Ditty, NT sent to Renato Gails I have talked to this pt and she does not want to speak to anyone else but Dr. [name]. She states that she's not coming to take a test until she speaks to him.

## 2019-09-28 NOTE — Telephone Encounter (Signed)
Dr. [name] please advise.    Patient Instructions by Brand Males, MD at 07/15/2019 11:15 AM Author: Brand Males, MD Author Type: Physician Filed: 07/15/2019 1:10 PM  Note Status: Addendum Mickle Mallory: Cosign Not Required Encounter Date: 07/15/2019  Editor: Brand Males, MD (Physician)  Prior Versions: 1. Brand Males, MD (Physician) at 07/15/2019 1:09 PM - Addendum   2. Brand Males, MD (Physician) at 07/15/2019 12:55 PM - Signed    ILD (interstitial lung disease) [facility])  - progressive but possibly stable since early 2020 based on history  plan - respect decision to defer biopsy due to husband death and cat being alone at home - repeat HRCT in 3 months  Vaccine counseling - new - support deferring covid vaccine due to history of allergies to many medications and ijnectiables - instead opt for Mab infusion if you end up with covid  Mass of upper lobe of left lung Mediastinal adenopathy Abnormal PET of left lung Preop respiratory exam  - respect decision to defer biopsy due to above reasons  Plan  - repeat HRCT in 3 months  Severe cough - new/worse  - due to above  Plan  - hycodan cough syrup 27mL once daily as needed - palliative measure  Followup 3 months but after HRCT For face to face encounter    Instructions  ILD (interstitial lung disease) ([place])  - progressive but possibly stable since early 2020 based on history  plan - respect decision to defer biopsy due to husband death and cat being alone at home - repeat HRCT in 3 months  Vaccine counseling - new - support deferring covid vaccine due to history of allergies to many medications and ijnectiables - instead opt for Mab infusion if you end up with covid  Mass of upper lobe of left lung Mediastinal adenopathy Abnormal PET of left lung Preop respiratory exam  - respect decision to defer biopsy due to above reasons  Plan  - repeat HRCT in 3 months  Severe cough -  new/worse  - due to above  Plan  - hycodan cough syrup 58mL once daily as needed - palliative measure  Followup 3 months but after HRCT For face to face encounter       After Visit Summary (Printed 07/15/2019)

## 2019-09-29 NOTE — Telephone Encounter (Signed)
lmtcb

## 2019-09-29 NOTE — Telephone Encounter (Signed)
Thanks. Please set upa a 15 min telephone or video visit in next few to several weeks to discuss  Thanks    SIGNATURE    Dr. Brand Males, M.D., F.C.C.P,  Pulmonary and Critical Care Medicine Staff Physician, [place] Director - Interstitial Lung Disease  Program  Pulmonary [place] [facility] [place], [place], [address]  Pager: [phone], If no answer or between  15:00h - 7:00h: call [phone] Telephone: [phone]  5:11 AM 09/29/2019

## 2019-09-29 NOTE — Telephone Encounter (Signed)
Left message for patient to call back  

## 2019-09-30 NOTE — Telephone Encounter (Signed)
LMTCB and will close per protocol  

## 2019-10-01 ENCOUNTER — Telehealth: Payer: Self-pay | Admitting: Internal Medicine

## 2019-10-01 NOTE — Telephone Encounter (Signed)
Spoke with patient. I offered her a sooner appt in one of the afternoon ILD spots in May. She stated that she would keep her appt for May 26th. Advised her to call us back if she needed anything.   Nothing further needed at time of call.

## 2019-11-03 ENCOUNTER — Emergency Department (HOSPITAL_COMMUNITY): Payer: Medicare Other

## 2019-11-03 ENCOUNTER — Encounter (HOSPITAL_COMMUNITY): Payer: Self-pay | Admitting: Emergency Medicine

## 2019-11-03 ENCOUNTER — Inpatient Hospital Stay (HOSPITAL_COMMUNITY)
Admission: EM | Admit: 2019-11-03 | Discharge: 2019-11-14 | DRG: 522 | Disposition: A | Discharge status: Skilled Nursing Facility | Payer: Medicare Other | Attending: Internal Medicine | Admitting: Internal Medicine

## 2019-11-03 ENCOUNTER — Telehealth: Payer: Self-pay | Admitting: Internal Medicine

## 2019-11-03 ENCOUNTER — Other Ambulatory Visit: Payer: Self-pay

## 2019-11-03 DIAGNOSIS — J84112 Idiopathic pulmonary fibrosis: Secondary | ICD-10-CM | POA: Diagnosis present

## 2019-11-03 DIAGNOSIS — Z87891 Personal history of nicotine dependence: Secondary | ICD-10-CM

## 2019-11-03 DIAGNOSIS — W07XXXA Fall from chair, initial encounter: Secondary | ICD-10-CM | POA: Diagnosis present

## 2019-11-03 DIAGNOSIS — Z833 Family history of diabetes mellitus: Secondary | ICD-10-CM | POA: Diagnosis not present

## 2019-11-03 DIAGNOSIS — Y92009 Unspecified place in unspecified non-institutional (private) residence as the place of occurrence of the external cause: Secondary | ICD-10-CM

## 2019-11-03 DIAGNOSIS — J9611 Chronic respiratory failure with hypoxia: Secondary | ICD-10-CM | POA: Diagnosis present

## 2019-11-03 DIAGNOSIS — S82122A Displaced fracture of lateral condyle of left tibia, initial encounter for closed fracture: Secondary | ICD-10-CM

## 2019-11-03 DIAGNOSIS — Z79899 Other long term (current) drug therapy: Secondary | ICD-10-CM | POA: Diagnosis not present

## 2019-11-03 DIAGNOSIS — D62 Acute posthemorrhagic anemia: Secondary | ICD-10-CM | POA: Diagnosis not present

## 2019-11-03 DIAGNOSIS — R0600 Dyspnea, unspecified: Secondary | ICD-10-CM | POA: Diagnosis not present

## 2019-11-03 DIAGNOSIS — Z8249 Family history of ischemic heart disease and other diseases of the circulatory system: Secondary | ICD-10-CM | POA: Diagnosis not present

## 2019-11-03 DIAGNOSIS — Z79890 Hormone replacement therapy (postmenopausal): Secondary | ICD-10-CM

## 2019-11-03 DIAGNOSIS — Z7984 Long term (current) use of oral hypoglycemic drugs: Secondary | ICD-10-CM

## 2019-11-03 DIAGNOSIS — K6289 Other specified diseases of anus and rectum: Secondary | ICD-10-CM

## 2019-11-03 DIAGNOSIS — J8410 Pulmonary fibrosis, unspecified: Secondary | ICD-10-CM | POA: Diagnosis not present

## 2019-11-03 DIAGNOSIS — R296 Repeated falls: Secondary | ICD-10-CM | POA: Diagnosis not present

## 2019-11-03 DIAGNOSIS — Z8371 Family history of colonic polyps: Secondary | ICD-10-CM | POA: Diagnosis not present

## 2019-11-03 DIAGNOSIS — Z881 Allergy status to other antibiotic agents status: Secondary | ICD-10-CM

## 2019-11-03 DIAGNOSIS — Z96641 Presence of right artificial hip joint: Secondary | ICD-10-CM

## 2019-11-03 DIAGNOSIS — Y92018 Other place in single-family (private) house as the place of occurrence of the external cause: Secondary | ICD-10-CM

## 2019-11-03 DIAGNOSIS — K589 Irritable bowel syndrome without diarrhea: Secondary | ICD-10-CM | POA: Diagnosis not present

## 2019-11-03 DIAGNOSIS — Z880 Allergy status to penicillin: Secondary | ICD-10-CM | POA: Diagnosis not present

## 2019-11-03 DIAGNOSIS — Z20822 Contact with and (suspected) exposure to covid-19: Secondary | ICD-10-CM | POA: Diagnosis present

## 2019-11-03 DIAGNOSIS — S72001A Fracture of unspecified part of neck of right femur, initial encounter for closed fracture: Secondary | ICD-10-CM | POA: Diagnosis present

## 2019-11-03 DIAGNOSIS — R590 Localized enlarged lymph nodes: Secondary | ICD-10-CM | POA: Diagnosis not present

## 2019-11-03 DIAGNOSIS — K219 Gastro-esophageal reflux disease without esophagitis: Secondary | ICD-10-CM | POA: Diagnosis present

## 2019-11-03 DIAGNOSIS — E039 Hypothyroidism, unspecified: Secondary | ICD-10-CM | POA: Diagnosis present

## 2019-11-03 DIAGNOSIS — Z888 Allergy status to other drugs, medicaments and biological substances status: Secondary | ICD-10-CM | POA: Diagnosis not present

## 2019-11-03 DIAGNOSIS — Z9981 Dependence on supplemental oxygen: Secondary | ICD-10-CM

## 2019-11-03 DIAGNOSIS — R918 Other nonspecific abnormal finding of lung field: Secondary | ICD-10-CM | POA: Diagnosis present

## 2019-11-03 DIAGNOSIS — E785 Hyperlipidemia, unspecified: Secondary | ICD-10-CM | POA: Diagnosis present

## 2019-11-03 DIAGNOSIS — C3492 Malignant neoplasm of unspecified part of left bronchus or lung: Secondary | ICD-10-CM | POA: Diagnosis present

## 2019-11-03 DIAGNOSIS — Z7189 Other specified counseling: Secondary | ICD-10-CM | POA: Diagnosis not present

## 2019-11-03 DIAGNOSIS — E1169 Type 2 diabetes mellitus with other specified complication: Secondary | ICD-10-CM | POA: Diagnosis present

## 2019-11-03 DIAGNOSIS — Z887 Allergy status to serum and vaccine status: Secondary | ICD-10-CM | POA: Diagnosis not present

## 2019-11-03 DIAGNOSIS — J849 Interstitial pulmonary disease, unspecified: Secondary | ICD-10-CM | POA: Diagnosis not present

## 2019-11-03 DIAGNOSIS — W19XXXA Unspecified fall, initial encounter: Secondary | ICD-10-CM | POA: Diagnosis not present

## 2019-11-03 LAB — CBC WITH DIFFERENTIAL/PLATELET
Abs Immature Granulocytes: 0.03 10*3/uL (ref 0.00–0.07)
Basophils Absolute: 0 10*3/uL (ref 0.0–0.1)
Basophils Relative: 0 %
Eosinophils Absolute: 0 10*3/uL (ref 0.0–0.5)
Eosinophils Relative: 0 %
HCT: 32.2 % — ABNORMAL LOW (ref 36.0–46.0)
Hemoglobin: 10 g/dL — ABNORMAL LOW (ref 12.0–15.0)
Immature Granulocytes: 0 %
Lymphocytes Relative: 14 %
Lymphs Abs: 1.2 10*3/uL (ref 0.7–4.0)
MCH: 27.5 pg (ref 26.0–34.0)
MCHC: 31.1 g/dL (ref 30.0–36.0)
MCV: 88.7 fL (ref 80.0–100.0)
Monocytes Absolute: 0.4 10*3/uL (ref 0.1–1.0)
Monocytes Relative: 4 %
Neutro Abs: 7.4 10*3/uL (ref 1.7–7.7)
Neutrophils Relative %: 82 %
Platelets: 313 10*3/uL (ref 150–400)
RBC: 3.63 MIL/uL — ABNORMAL LOW (ref 3.87–5.11)
RDW: 14.7 % (ref 11.5–15.5)
WBC: 9 10*3/uL (ref 4.0–10.5)
nRBC: 0 % (ref 0.0–0.2)

## 2019-11-03 LAB — COMPREHENSIVE METABOLIC PANEL
ALT: 14 U/L (ref 0–44)
AST: 22 U/L (ref 15–41)
Albumin: 2.9 g/dL — ABNORMAL LOW (ref 3.5–5.0)
Alkaline Phosphatase: 79 U/L (ref 38–126)
Anion gap: 8 (ref 5–15)
BUN: 11 mg/dL (ref 8–23)
CO2: 27 mmol/L (ref 22–32)
Calcium: 9.3 mg/dL (ref 8.9–10.3)
Chloride: 102 mmol/L (ref 98–111)
Creatinine, Ser: 0.61 mg/dL (ref 0.44–1.00)
GFR calc Af Amer: >60 mL/min (ref >60)
GFR calc non Af Amer: >60 mL/min (ref >60)
Glucose, Bld: 142 mg/dL — ABNORMAL HIGH (ref 70–99)
Potassium: 3.9 mmol/L (ref 3.5–5.1)
Sodium: 137 mmol/L (ref 135–145)
Total Bilirubin: 0.4 mg/dL (ref 0.3–1.2)
Total Protein: 7.8 g/dL (ref 6.5–8.1)

## 2019-11-03 LAB — TYPE AND SCREEN
ABO/RH(D): B POS
Antibody Screen: NEGATIVE

## 2019-11-03 LAB — PROTIME-INR
INR: 1.1 (ref 0.8–1.2)
Prothrombin Time: 14.2 seconds (ref 11.4–15.2)

## 2019-11-03 LAB — CK: Total CK: 147 U/L (ref 38–234)

## 2019-11-03 LAB — SARS CORONAVIRUS 2 BY RT PCR (HOSPITAL ORDER, PERFORMED IN ~~LOC~~ HOSPITAL LAB): SARS Coronavirus 2: NEGATIVE

## 2019-11-03 MED ORDER — LEVOTHYROXINE SODIUM 88 MCG PO TABS
88.0000 ug | ORAL_TABLET | Freq: Every day | ORAL | Status: DC
  Administered 2019-11-04 – 2019-11-14 (×10): 88 ug via ORAL
  Filled 2019-11-03 (×11): qty 1

## 2019-11-03 MED ORDER — ASCORBIC ACID 500 MG PO TABS
1000.0000 mg | ORAL_TABLET | Freq: Every day | ORAL | Status: DC
  Administered 2019-11-04 – 2019-11-13 (×10): 1000 mg via ORAL
  Filled 2019-11-03 (×10): qty 2

## 2019-11-03 MED ORDER — BIOTIN 10000 MCG PO TABS: 10000.0000 ug | ORAL_TABLET | Freq: Every day | ORAL | Status: DC

## 2019-11-03 MED ORDER — PRAVASTATIN SODIUM 20 MG PO TABS
40.0000 mg | ORAL_TABLET | Freq: Every day | ORAL | Status: DC
  Administered 2019-11-05 – 2019-11-14 (×9): 40 mg via ORAL
  Filled 2019-11-03 (×10): qty 2

## 2019-11-03 MED ORDER — ADULT MULTIVITAMIN W/MINERALS CH
1.0000 | ORAL_TABLET | Freq: Every day | ORAL | Status: DC
  Administered 2019-11-05 – 2019-11-14 (×9): 1 via ORAL
  Filled 2019-11-03 (×9): qty 1

## 2019-11-03 MED ORDER — BENZONATATE 100 MG PO CAPS
100.0000 mg | ORAL_CAPSULE | Freq: Three times a day (TID) | ORAL | Status: DC | PRN
  Administered 2019-11-08 – 2019-11-13 (×7): 100 mg via ORAL
  Filled 2019-11-03 (×7): qty 1

## 2019-11-03 MED ORDER — TRAMADOL HCL 50 MG PO TABS: 50.0000 mg | ORAL_TABLET | Freq: Four times a day (QID) | ORAL | Status: DC | PRN

## 2019-11-03 MED ORDER — HYDROMORPHONE HCL 1 MG/ML IJ SOLN
0.5000 mg | Freq: Once | INTRAMUSCULAR | Status: AC
  Administered 2019-11-03: 0.5 mg via INTRAVENOUS
  Filled 2019-11-03: qty 1

## 2019-11-03 MED ORDER — HEPARIN SODIUM (PORCINE) 5000 UNIT/ML IJ SOLN
5000.0000 [IU] | Freq: Three times a day (TID) | INTRAMUSCULAR | Status: DC
  Administered 2019-11-04: 5000 [IU] via SUBCUTANEOUS
  Filled 2019-11-03: qty 1

## 2019-11-03 MED ORDER — DOCUSATE SODIUM 100 MG PO CAPS: 100.0000 mg | ORAL_CAPSULE | Freq: Two times a day (BID) | ORAL | Status: DC

## 2019-11-03 MED ORDER — SODIUM CHLORIDE 0.9 % IV BOLUS
1000.0000 mL | Freq: Once | INTRAVENOUS | Status: AC
  Administered 2019-11-03: 1000 mL via INTRAVENOUS

## 2019-11-03 MED ORDER — ONE-DAILY MULTI VITAMINS PO TABS: 1.0000 | ORAL_TABLET | Freq: Every day | ORAL | Status: DC

## 2019-11-03 MED ORDER — FENTANYL CITRATE (PF) 100 MCG/2ML IJ SOLN
50.0000 ug | Freq: Once | INTRAMUSCULAR | Status: AC
  Administered 2019-11-03: 50 ug via INTRAVENOUS
  Filled 2019-11-03: qty 2

## 2019-11-03 MED ORDER — MORPHINE SULFATE (PF) 2 MG/ML IV SOLN
1.0000 mg | INTRAVENOUS | Status: DC | PRN
  Administered 2019-11-03 – 2019-11-04 (×2): 1 mg via INTRAVENOUS
  Filled 2019-11-03 (×2): qty 1

## 2019-11-03 NOTE — ED Notes (Signed)
Patient given diet soda.

## 2019-11-03 NOTE — Progress Notes (Signed)
Talked with patient's son whose son had hip surgery by Dr. Marcelino Scot. He had called Dr. Marcelino Scot about care for his Mother. Nice discussion with him, he will call be or text me and let me know if he would like me to provide care for his Mother or Dr. Marcelino Scot.

## 2019-11-03 NOTE — Telephone Encounter (Signed)
Spoke with patient's daughter [name]. She stated that her mother fell sometime during the night or early morning. She stayed in the same spot on the floor until she was discovered by her son around noon today. EMS was called because they could not get her off of the floor due to the pain. EMS took her to [facility] where she will be admitted.   [name] stated that the patient was adamant that MR is aware that she is at [facility]. I advised [name] I would route this message over to him.   While on the phone, she stated that her mother is scheduled for a televisit tomorrow with MR and this will need to be cancelled. Advised her I would take care of this and we will be in contact once she has been discharged.   Routing to MR as a FYI.

## 2019-11-03 NOTE — ED Triage Notes (Signed)
Per EMS, patient from home, reports trip and fall last night getting out of chair. Down until approximately 45 minutes ago. C/o back pain and pain to right leg and ankle.  2L [place] at baseline.   18g R AC 100 mcg Fentanyl with EMS

## 2019-11-03 NOTE — ED Notes (Signed)
Transport called to take patient to assigned room. 

## 2019-11-03 NOTE — Progress Notes (Signed)

## 2019-11-03 NOTE — ED Provider Notes (Signed)
[facility] DEPT Provider Note   CSN: [id number] Arrival date & time: 11/03/19  1531     History Chief Complaint  Patient presents with  . Fall    [name] is a 79 y.o. female with a past medical history of diabetes, IBS, ILD, presenting to the ED with a chief complaint of right hip pain after fall that occurred approximately 12 hours ago.  States that she was at home getting out of her chair at approximately 2 AM when she fell.  States that she laid there for about 12 hours until her son found her just prior to arrival.  States that she was unable to get up due to pain in her right leg.  She denies any head injury, loss of consciousness or anticoagulant use.  States that she did not fall due to dizziness or lightheadedness but she also does not endorse a mechanical fall.  States a similar thing happened to her 2 years ago but she never knew why.  She denies any vision changes, vomiting, numbness in arms or legs, saddle anesthesia, chest pain, shortness of breath, loss of bowel or bladder function. She lives at home by herself.  She usually ambulates without assistance but will sometimes use a cane per her kids request.  HPI     Past Medical History:  Diagnosis Date  . Bronchiectasis    PFTs August 23, 2010 VC 82%   dlco 62 > 129 CORRECTED  . Chronic diarrhea   . Chronic rhinitis    sinus CT ordered August 23, 2010  . Diabetes mellitus ([place])   . Hyperplastic colon polyp   . IBS (irritable bowel syndrome)   . ILD (interstitial lung disease) ([place])   . Stenosis of rectum and anus   . Thyroid disease     Patient Active Problem List   Diagnosis Date Noted  . Essential hypertension 07/20/2018  . Hypercholesterolemia 07/20/2018  . Diabetes mellitus type 2 in obese ([place]) 07/20/2018  . Coronary artery calcification seen on CAT scan 07/20/2018  . Abnormal finding on lung imaging 05/22/2018  . Hypokalemia 05/13/2018  . Hypoxemia 05/13/2018  .  Diabetes mellitus without complication ([place]) 70/35/0093  . Environmental allergies 02/07/2015  . IPF (idiopathic pulmonary fibrosis) ([place]) 07/15/2014  . Chronic cough 02/11/2014  . ILD (interstitial lung disease) ([place]) 11/18/2013  . Chronic rhinitis 10/02/2010  . GERD (gastroesophageal reflux disease) 10/02/2010  . INTERSTITIAL LUNG DISEASE 07/10/2010  . DEPRESSION/ANXIETY 04/12/2008  . Irritable bowel syndrome 10/09/2007  . STENOSIS OF RECTUM AND ANUS 10/09/2007  . DIARRHEA, CHRONIC 10/09/2007    Past Surgical History:  Procedure Laterality Date  .  gallbaldder    . CHOLECYSTECTOMY    . DILATION AND CURETTAGE OF UTERUS    . HAND SURGERY  1991   left hand; Baker's Cyst removed  . migraine    . tear duct surgery       OB History   No obstetric history on file.     Family History  Problem Relation Age of Onset  . Colon polyps Sister   . Heart disease Sister   . Diabetes Sister   . Heart disease Mother   . Colon cancer Neg Hx     Social History   Tobacco Use  . Smoking status: Former Smoker    Packs/day: 3.00    Years: 15.00    Pack years: 45.00    Types: Cigarettes    Quit date: 06/11/1976    Years  since quitting: 43.4  . Smokeless tobacco: Never Used  Substance Use Topics  . Alcohol use: No  . Drug use: No    Home Medications Prior to Admission medications   Medication Sig Start Date End Date Taking? Authorizing Provider  acetaminophen (TYLENOL) 500 MG tablet Take 1,000 mg by mouth at bedtime.   Yes [provider]  Ascorbic Acid (VITAMIN C) 1000 MG tablet Take 1,000 mg by mouth at bedtime.    Yes [provider]  benzonatate (TESSALON) 100 MG capsule TAKE 1 CAPSULE (100 MG TOTAL) BY MOUTH EVERY 8 (EIGHT) HOURS AS NEEDED FOR COUGH. 11/18/18  Yes Croitoru, Mihai, MD  Biotin 10000 MCG TABS Take 10,000 mcg by mouth daily.    Yes [provider]  Calcium-Magnesium-Vitamin D (CALCIUM 1200+D3 PO) Take 1 tablet by mouth at bedtime.   Yes  [provider]  clorazepate (TRANXENE) 3.75 MG tablet TAKE 1 TABLET BY MOUTH DAILY.MAY TAKE UP TO 2 DAILY IF NEEDED Patient taking differently: Take 3.75 mg by mouth daily.  05/10/15  Yes Nandigam, Venia Minks, MD  cyclobenzaprine (FLEXERIL) 10 MG tablet Take 10 mg by mouth at bedtime.    Yes [provider]  levothyroxine (SYNTHROID, LEVOTHROID) 88 MCG tablet Take 88 mcg by mouth daily before breakfast.    Yes [provider]  Multiple Vitamin (MULTIVITAMIN WITH MINERALS) TABS tablet Take 1 tablet by mouth daily.   Yes [provider]  Multiple Vitamin (MULTIVITAMIN) tablet Take 1 tablet by mouth daily.     Yes [provider]  pioglitazone (ACTOS) 15 MG tablet Take 15 mg by mouth daily.   Yes [provider]  pravastatin (PRAVACHOL) 40 MG tablet TAKE 1 TABLET (40 MG TOTAL) BY MOUTH EVERY EVENING. Patient taking differently: Take 40 mg by mouth daily.  08/14/19  Yes Croitoru, Mihai, MD  verapamil (CALAN) 120 MG tablet Take 60 mg by mouth 2 (two) times daily.    Yes [provider]  HYDROcodone-homatropine (HYCODAN) 5-1.5 MG/5ML syrup Take 5 mLs by mouth daily as needed for cough. Patient not taking: Reported on 11/03/2019 07/15/19   Brand Males, MD    Allergies    Penicillins, Influenza vaccines, Clarithromycin, Cortisone, and Povidone-iodine  Review of Systems   Review of Systems  Constitutional: Negative for appetite change, chills and fever.  HENT: Negative for ear pain, rhinorrhea, sneezing and sore throat.   Eyes: Negative for photophobia and visual disturbance.  Respiratory: Negative for cough, chest tightness, shortness of breath and wheezing.   Cardiovascular: Negative for chest pain and palpitations.  Gastrointestinal: Negative for abdominal pain, blood in stool, constipation, diarrhea, nausea and vomiting.  Genitourinary: Negative for dysuria, hematuria and urgency.  Musculoskeletal: Positive for arthralgias and back  pain. Negative for myalgias.  Skin: Negative for rash.  Neurological: Negative for dizziness, weakness and light-headedness.    Physical Exam Updated Vital Signs BP 129/74   Pulse 89   Temp 98.5 F (36.9 C) (Oral)   Resp (!) 27   SpO2 100%   Physical Exam Vitals and nursing note reviewed.  Constitutional:      General: She is not in acute distress.    Appearance: She is well-developed.  HENT:     Head: Normocephalic and atraumatic.     Nose: Nose normal.  Eyes:     General: No scleral icterus.       Right eye: No discharge.        Left eye: No discharge.  Conjunctiva/sclera: Conjunctivae normal.     Pupils: Pupils are equal, round, and reactive to light.  Cardiovascular:     Rate and Rhythm: Normal rate and regular rhythm.     Heart sounds: Normal heart sounds. No murmur. No friction rub. No gallop.   Pulmonary:     Effort: Pulmonary effort is normal. No respiratory distress.     Breath sounds: Normal breath sounds.  Abdominal:     General: Bowel sounds are normal. There is no distension.     Palpations: Abdomen is soft.     Tenderness: There is no abdominal tenderness. There is no guarding.  Musculoskeletal:        General: Normal range of motion.     Cervical back: Normal range of motion and neck supple.     Comments: R leg externally rotated, pain with movement. TTP of the R hip minimally but pain worse with movement.  2+ DP pulses palpated bilaterally.  Normal range of motion of left lower extremity.  Skin:    General: Skin is warm and dry.     Findings: No rash.  Neurological:     General: No focal deficit present.     Mental Status: She is alert and oriented to person, place, and time.     Cranial Nerves: No cranial nerve deficit.     Sensory: No sensory deficit.     Motor: No weakness or abnormal muscle tone.     Coordination: Coordination normal.     ED Results / Procedures / Treatments   Labs (all labs ordered are listed, but only abnormal results  are displayed) Labs Reviewed  COMPREHENSIVE METABOLIC PANEL - Abnormal; Notable for the following components:      Result Value   Glucose, Bld 142 (*)    Albumin 2.9 (*)    All other components within normal limits  CBC WITH DIFFERENTIAL/PLATELET - Abnormal; Notable for the following components:   RBC 3.63 (*)    Hemoglobin 10.0 (*)    HCT 32.2 (*)    All other components within normal limits  SARS CORONAVIRUS 2 BY RT PCR (HOSPITAL ORDER, [facility])  CK  URINALYSIS, ROUTINE W REFLEX MICROSCOPIC    EKG None  Radiology DG Pelvis Portable  Result Date: 11/03/2019 CLINICAL DATA:  Status post fall. EXAM: PORTABLE PELVIS 1-2 VIEWS COMPARISON:  None. FINDINGS: Acute fracture deformity is seen extending through the neck of the proximal right femur. No significant displacement of the proximal and distal fracture sites is seen. There is no evidence of dislocation. No pelvic bone lesions are seen. IMPRESSION: Acute fracture of the proximal right femur. Electronically Signed   By: Virgina Norfolk M.D.   On: 11/03/2019 17:01   DG Chest Portable 1 View  Result Date: 11/03/2019 CLINICAL DATA:  Status post fall. EXAM: PORTABLE CHEST 1 VIEW COMPARISON:  May 13, 2018 FINDINGS: Mild-to-moderate severity diffuse, chronic appearing increased interstitial lung markings are seen. Mild biapical pleural thickening is noted. Mild atelectasis and/or infiltrate is seen within the left lung base and along the periphery of the right upper lobe. A 5.6 cm x 6.2 cm round patchy opacity is seen overlying the mid to upper left lung. This represents a new finding when compared to the prior exam. There is no evidence of a pleural effusion or pneumothorax. The heart size and mediastinal contours are within normal limits. The visualized skeletal structures are unremarkable. IMPRESSION: 1. Mild atelectasis and/or infiltrate within the left lung base and  along the periphery of the right upper  lobe. 2. New 5.6 cm x 6.2 cm round patchy opacity overlying the mid to upper left lung. While this may be infectious in etiology, the presence of a large left lung mass cannot be excluded. Correlation with chest CT is recommended. Electronically Signed   By: Virgina Norfolk M.D.   On: 11/03/2019 17:05    Procedures Procedures (including critical care time)  Medications Ordered in ED Medications  sodium chloride 0.9 % bolus 1,000 mL (has no administration in time range)  fentaNYL (SUBLIMAZE) injection 50 mcg (50 mcg Intravenous Given 11/03/19 1745)  HYDROmorphone (DILAUDID) injection 0.5 mg (0.5 mg Intravenous Given 11/03/19 1844)    ED Course  I have reviewed the triage vital signs and the nursing notes.  Pertinent labs & imaging results that were available during my care of the patient were reviewed by me and considered in my medical decision making (see chart for details).    MDM Rules/Calculators/A&P                      79 year old female with a past medical history of diabetes, IBS, ILD presenting to the ED with a chief complaint of right hip pain after fall that occurred approximately 12 hours ago.  Was getting out of her chair at approximately 2 AM when she fell.  States that she laid there until her son found her 12 hours later.  Reports pain in her right hip radiating down her right leg.  Denies any head injury, loss of consciousness or anticoagulant use.  She has tenderness to palpation of the right hip area with limited range of motion.  Normal sensation noted.  No bruising noted.  Normal neurological exam.  Work appears significant for pelvic x-ray showing right femoral neck fracture.  There is an incidental finding of lung mass.  CK and remainder of labs unremarkable.  Patient's pain controlled in the ED.  Spoke to Dr. Lorin Mercy, orthopedics who request patient be n.p.o. after midnight for possible repair tomorrow.  Will admit to hospitalist service.  Patient and her son updated on  plan and are agreeable.  All imaging, if done today, including plain films, CT scans, and ultrasounds, independently reviewed by me, and interpretations confirmed via formal radiology reads.  Portions of this note were generated with [employer]. Dictation errors may occur despite best attempts at proofreading.  Final Clinical Impression(s) / ED Diagnoses Final diagnoses:  Fall in home, initial encounter  Closed fracture of neck of right femur, initial encounter [facility])    Rx / [place] Orders ED Discharge Orders    None       Delia Heady, PA-C 11/03/19 1920    Quintella Reichert, MD 11/05/19 [phone]

## 2019-11-04 ENCOUNTER — Encounter (HOSPITAL_COMMUNITY)
Admission: EM | Disposition: A | Discharge status: Skilled Nursing Facility | Payer: Self-pay | Source: Home / Self Care | Attending: Internal Medicine

## 2019-11-04 ENCOUNTER — Inpatient Hospital Stay (HOSPITAL_COMMUNITY): Payer: Medicare Other | Admitting: Certified Registered Nurse Anesthetist

## 2019-11-04 ENCOUNTER — Inpatient Hospital Stay (HOSPITAL_COMMUNITY): Payer: Medicare Other

## 2019-11-04 ENCOUNTER — Ambulatory Visit: Payer: Medicare Other | Admitting: Internal Medicine

## 2019-11-04 ENCOUNTER — Encounter (HOSPITAL_COMMUNITY): Payer: Self-pay | Admitting: Family Medicine

## 2019-11-04 DIAGNOSIS — E039 Hypothyroidism, unspecified: Secondary | ICD-10-CM | POA: Diagnosis present

## 2019-11-04 DIAGNOSIS — E1169 Type 2 diabetes mellitus with other specified complication: Secondary | ICD-10-CM

## 2019-11-04 DIAGNOSIS — R296 Repeated falls: Secondary | ICD-10-CM

## 2019-11-04 DIAGNOSIS — R590 Localized enlarged lymph nodes: Secondary | ICD-10-CM

## 2019-11-04 DIAGNOSIS — E785 Hyperlipidemia, unspecified: Secondary | ICD-10-CM

## 2019-11-04 HISTORY — PX: TOTAL HIP ARTHROPLASTY: SHX124

## 2019-11-04 LAB — CBC
HCT: 32.2 % — ABNORMAL LOW (ref 36.0–46.0)
Hemoglobin: 9.7 g/dL — ABNORMAL LOW (ref 12.0–15.0)
MCH: 27.2 pg (ref 26.0–34.0)
MCHC: 30.1 g/dL (ref 30.0–36.0)
MCV: 90.2 fL (ref 80.0–100.0)
Platelets: 247 10*3/uL (ref 150–400)
RBC: 3.57 MIL/uL — ABNORMAL LOW (ref 3.87–5.11)
RDW: 15.1 % (ref 11.5–15.5)
WBC: 8.1 10*3/uL (ref 4.0–10.5)
nRBC: 0 % (ref 0.0–0.2)

## 2019-11-04 LAB — HEMOGLOBIN A1C
Hgb A1c MFr Bld: 6.2 % — ABNORMAL HIGH (ref 4.8–5.6)
Mean Plasma Glucose: 131.24 mg/dL

## 2019-11-04 LAB — ABO/RH: ABO/RH(D): B POS

## 2019-11-04 LAB — SURGICAL PCR SCREEN
MRSA, PCR: NEGATIVE
Staphylococcus aureus: NEGATIVE

## 2019-11-04 LAB — BASIC METABOLIC PANEL
Anion gap: 7 (ref 5–15)
BUN: 11 mg/dL (ref 8–23)
CO2: 25 mmol/L (ref 22–32)
Calcium: 8.5 mg/dL — ABNORMAL LOW (ref 8.9–10.3)
Chloride: 102 mmol/L (ref 98–111)
Creatinine, Ser: 0.59 mg/dL (ref 0.44–1.00)
GFR calc Af Amer: >60 mL/min (ref >60)
GFR calc non Af Amer: >60 mL/min (ref >60)
Glucose, Bld: 186 mg/dL — ABNORMAL HIGH (ref 70–99)
Potassium: 3.7 mmol/L (ref 3.5–5.1)
Sodium: 134 mmol/L — ABNORMAL LOW (ref 135–145)

## 2019-11-04 LAB — GLUCOSE, CAPILLARY: Glucose-Capillary: 234 mg/dL — ABNORMAL HIGH (ref 70–99)

## 2019-11-04 SURGERY — ARTHROPLASTY, HIP, TOTAL, ANTERIOR APPROACH
Anesthesia: Spinal | Site: Hip | Laterality: Right

## 2019-11-04 MED ORDER — FERROUS SULFATE 325 (65 FE) MG PO TABS
325.0000 mg | ORAL_TABLET | Freq: Three times a day (TID) | ORAL | Status: DC
  Administered 2019-11-05 – 2019-11-14 (×25): 325 mg via ORAL
  Filled 2019-11-04 (×25): qty 1

## 2019-11-04 MED ORDER — FENTANYL CITRATE (PF) 100 MCG/2ML IJ SOLN
25.0000 ug | INTRAMUSCULAR | Status: DC | PRN
  Administered 2019-11-04: 25 ug via INTRAVENOUS

## 2019-11-04 MED ORDER — STERILE WATER FOR IRRIGATION IR SOLN
Status: DC | PRN
  Administered 2019-11-04: 2000 mL

## 2019-11-04 MED ORDER — HYDROCODONE-ACETAMINOPHEN 7.5-325 MG PO TABS: 1.0000 | ORAL_TABLET | ORAL | Status: DC | PRN

## 2019-11-04 MED ORDER — ONDANSETRON HCL 4 MG/2ML IJ SOLN
INTRAMUSCULAR | Status: AC
  Filled 2019-11-04: qty 6

## 2019-11-04 MED ORDER — DEXAMETHASONE SODIUM PHOSPHATE 10 MG/ML IJ SOLN
INTRAMUSCULAR | Status: DC | PRN
  Administered 2019-11-04: 10 mg via INTRAVENOUS

## 2019-11-04 MED ORDER — FENTANYL CITRATE (PF) 100 MCG/2ML IJ SOLN
INTRAMUSCULAR | Status: AC
  Filled 2019-11-04: qty 2

## 2019-11-04 MED ORDER — ONDANSETRON HCL 4 MG PO TABS
4.0000 mg | ORAL_TABLET | Freq: Four times a day (QID) | ORAL | Status: DC | PRN
  Administered 2019-11-11: 4 mg via ORAL
  Filled 2019-11-04: qty 1

## 2019-11-04 MED ORDER — CEFAZOLIN SODIUM-DEXTROSE 2-4 GM/100ML-% IV SOLN
2.0000 g | INTRAVENOUS | Status: AC
  Administered 2019-11-04: 2 g via INTRAVENOUS
  Filled 2019-11-04: qty 100

## 2019-11-04 MED ORDER — HYPROMELLOSE (GONIOSCOPIC) 2.5 % OP SOLN: 1.0000 [drp] | Freq: Three times a day (TID) | OPHTHALMIC | Status: DC | PRN

## 2019-11-04 MED ORDER — CHLORHEXIDINE GLUCONATE 4 % EX LIQD
60.0000 mL | Freq: Once | CUTANEOUS | Status: AC
  Administered 2019-11-04: 4 via TOPICAL

## 2019-11-04 MED ORDER — LACTATED RINGERS IV SOLN: INTRAVENOUS | Status: DC

## 2019-11-04 MED ORDER — PROPOFOL 500 MG/50ML IV EMUL
INTRAVENOUS | Status: DC | PRN
  Administered 2019-11-04: 75 ug/kg/min via INTRAVENOUS
  Administered 2019-11-04: 20 mg via INTRAVENOUS

## 2019-11-04 MED ORDER — PHENOL 1.4 % MT LIQD: 1.0000 | OROMUCOSAL | Status: DC | PRN

## 2019-11-04 MED ORDER — INSULIN ASPART 100 UNIT/ML ~~LOC~~ SOLN
0.0000 [IU] | Freq: Three times a day (TID) | SUBCUTANEOUS | Status: DC
  Administered 2019-11-05 (×2): 2 [IU] via SUBCUTANEOUS
  Administered 2019-11-05 – 2019-11-09 (×8): 1 [IU] via SUBCUTANEOUS
  Administered 2019-11-09: 2 [IU] via SUBCUTANEOUS
  Administered 2019-11-10 – 2019-11-12 (×5): 1 [IU] via SUBCUTANEOUS
  Administered 2019-11-13: 5 [IU] via SUBCUTANEOUS
  Administered 2019-11-13: 7 [IU] via SUBCUTANEOUS
  Administered 2019-11-14: 2 [IU] via SUBCUTANEOUS

## 2019-11-04 MED ORDER — DEXAMETHASONE SODIUM PHOSPHATE 10 MG/ML IJ SOLN
INTRAMUSCULAR | Status: AC
  Filled 2019-11-04: qty 2

## 2019-11-04 MED ORDER — ENSURE ENLIVE PO LIQD
237.0000 mL | Freq: Two times a day (BID) | ORAL | Status: DC
  Administered 2019-11-04 – 2019-11-10 (×4): 237 mL via ORAL
  Filled 2019-11-04 (×3): qty 237

## 2019-11-04 MED ORDER — HYDROCODONE-ACETAMINOPHEN 5-325 MG PO TABS
1.0000 | ORAL_TABLET | ORAL | Status: DC | PRN
  Administered 2019-11-04 – 2019-11-10 (×7): 1 via ORAL
  Filled 2019-11-04 (×8): qty 1

## 2019-11-04 MED ORDER — METOCLOPRAMIDE HCL 5 MG/ML IJ SOLN: 5.0000 mg | Freq: Three times a day (TID) | INTRAMUSCULAR | Status: DC | PRN

## 2019-11-04 MED ORDER — PHENYLEPHRINE HCL-NACL 10-0.9 MG/250ML-% IV SOLN
INTRAVENOUS | Status: DC | PRN
  Administered 2019-11-04: 50 ug/min via INTRAVENOUS

## 2019-11-04 MED ORDER — HYDROMORPHONE HCL 1 MG/ML IJ SOLN
0.5000 mg | INTRAMUSCULAR | Status: DC | PRN
  Administered 2019-11-04: 1 mg via INTRAVENOUS
  Filled 2019-11-04: qty 1

## 2019-11-04 MED ORDER — ONDANSETRON HCL 4 MG/2ML IJ SOLN
INTRAMUSCULAR | Status: DC | PRN
  Administered 2019-11-04: 4 mg via INTRAVENOUS

## 2019-11-04 MED ORDER — FENTANYL CITRATE (PF) 100 MCG/2ML IJ SOLN
INTRAMUSCULAR | Status: DC | PRN
  Administered 2019-11-04: 50 ug via INTRAVENOUS

## 2019-11-04 MED ORDER — ONDANSETRON HCL 4 MG/2ML IJ SOLN: 4.0000 mg | Freq: Once | INTRAMUSCULAR | Status: DC | PRN

## 2019-11-04 MED ORDER — PHENYLEPHRINE 40 MCG/ML (10ML) SYRINGE FOR IV PUSH (FOR BLOOD PRESSURE SUPPORT)
PREFILLED_SYRINGE | INTRAVENOUS | Status: DC | PRN
  Administered 2019-11-04: 120 ug via INTRAVENOUS

## 2019-11-04 MED ORDER — SODIUM CHLORIDE 0.9 % IR SOLN
Status: DC | PRN
  Administered 2019-11-04: 1000 mL

## 2019-11-04 MED ORDER — MENTHOL 3 MG MT LOZG: 1.0000 | LOZENGE | OROMUCOSAL | Status: DC | PRN

## 2019-11-04 MED ORDER — CHLORHEXIDINE GLUCONATE CLOTH 2 % EX PADS
6.0000 | MEDICATED_PAD | Freq: Every day | CUTANEOUS | Status: DC
  Administered 2019-11-04 – 2019-11-08 (×2): 6 via TOPICAL

## 2019-11-04 MED ORDER — ACETAMINOPHEN 325 MG PO TABS: 325.0000 mg | ORAL_TABLET | Freq: Four times a day (QID) | ORAL | Status: DC | PRN

## 2019-11-04 MED ORDER — PROPOFOL 1000 MG/100ML IV EMUL
INTRAVENOUS | Status: AC
  Filled 2019-11-04: qty 100

## 2019-11-04 MED ORDER — KETAMINE HCL 10 MG/ML IJ SOLN
INTRAMUSCULAR | Status: AC
  Filled 2019-11-04: qty 1

## 2019-11-04 MED ORDER — METOCLOPRAMIDE HCL 5 MG PO TABS: 5.0000 mg | ORAL_TABLET | Freq: Three times a day (TID) | ORAL | Status: DC | PRN

## 2019-11-04 MED ORDER — CELECOXIB 200 MG PO CAPS
200.0000 mg | ORAL_CAPSULE | Freq: Two times a day (BID) | ORAL | Status: DC
  Administered 2019-11-04 – 2019-11-14 (×18): 200 mg via ORAL
  Filled 2019-11-04 (×18): qty 1

## 2019-11-04 MED ORDER — LIDOCAINE 2% (20 MG/ML) 5 ML SYRINGE
INTRAMUSCULAR | Status: AC
  Filled 2019-11-04: qty 5

## 2019-11-04 MED ORDER — ONDANSETRON HCL 4 MG/2ML IJ SOLN: 4.0000 mg | Freq: Four times a day (QID) | INTRAMUSCULAR | Status: DC | PRN

## 2019-11-04 MED ORDER — LIDOCAINE 2% (20 MG/ML) 5 ML SYRINGE
INTRAMUSCULAR | Status: DC | PRN
  Administered 2019-11-04: 60 mg via INTRAVENOUS

## 2019-11-04 MED ORDER — KETAMINE HCL 10 MG/ML IJ SOLN
INTRAMUSCULAR | Status: DC | PRN
  Administered 2019-11-04: 20 mg via INTRAVENOUS

## 2019-11-04 MED ORDER — BUPIVACAINE IN DEXTROSE 0.75-8.25 % IT SOLN
INTRATHECAL | Status: DC | PRN
  Administered 2019-11-04: 1.8 mL via INTRATHECAL

## 2019-11-04 MED ORDER — METHOCARBAMOL 1000 MG/10ML IJ SOLN
500.0000 mg | Freq: Four times a day (QID) | INTRAVENOUS | Status: DC | PRN
  Filled 2019-11-04: qty 5

## 2019-11-04 MED ORDER — DOCUSATE SODIUM 100 MG PO CAPS
100.0000 mg | ORAL_CAPSULE | Freq: Two times a day (BID) | ORAL | Status: DC
  Administered 2019-11-04 – 2019-11-14 (×4): 100 mg via ORAL
  Filled 2019-11-04 (×12): qty 1

## 2019-11-04 MED ORDER — CHLORHEXIDINE GLUCONATE 0.12 % MT SOLN
15.0000 mL | Freq: Once | OROMUCOSAL | Status: AC
  Administered 2019-11-04: 15 mL via OROMUCOSAL

## 2019-11-04 MED ORDER — ASPIRIN EC 325 MG PO TBEC: 325.0000 mg | DELAYED_RELEASE_TABLET | Freq: Two times a day (BID) | ORAL | Status: DC

## 2019-11-04 MED ORDER — POLYVINYL ALCOHOL 1.4 % OP SOLN
1.0000 [drp] | Freq: Three times a day (TID) | OPHTHALMIC | Status: DC | PRN
  Administered 2019-11-04: 1 [drp] via OPHTHALMIC
  Filled 2019-11-04 (×2): qty 15

## 2019-11-04 MED ORDER — LIDOCAINE 2% (20 MG/ML) 5 ML SYRINGE
INTRAMUSCULAR | Status: AC
  Filled 2019-11-04: qty 10

## 2019-11-04 MED ORDER — METHOCARBAMOL 500 MG PO TABS
500.0000 mg | ORAL_TABLET | Freq: Four times a day (QID) | ORAL | Status: DC | PRN
  Administered 2019-11-04 – 2019-11-12 (×8): 500 mg via ORAL
  Filled 2019-11-04 (×8): qty 1

## 2019-11-04 MED ORDER — SODIUM CHLORIDE 0.9 % IV SOLN: INTRAVENOUS | Status: AC | PRN

## 2019-11-04 MED ORDER — CEFAZOLIN SODIUM-DEXTROSE 2-4 GM/100ML-% IV SOLN
2.0000 g | Freq: Four times a day (QID) | INTRAVENOUS | Status: AC
  Administered 2019-11-04 – 2019-11-05 (×2): 2 g via INTRAVENOUS
  Filled 2019-11-04 (×2): qty 100

## 2019-11-04 SURGICAL SUPPLY — 48 items
ADH SKN CLS APL DERMABOND .7 (GAUZE/BANDAGES/DRESSINGS) ×1
BAG DECANTER FOR FLEXI CONT (MISCELLANEOUS) IMPLANT
BAG SPEC THK2 15X12 ZIP CLS (MISCELLANEOUS)
BAG ZIPLOCK 12X15 (MISCELLANEOUS) IMPLANT
BLADE SAG 18X100X1.27 (BLADE) ×3 IMPLANT
BLADE SURG SZ10 CARB STEEL (BLADE) ×6 IMPLANT
COVER PERINEAL POST (MISCELLANEOUS) ×3 IMPLANT
COVER SURGICAL LIGHT HANDLE (MISCELLANEOUS) ×3 IMPLANT
COVER WAND RF STERILE (DRAPES) IMPLANT
CUP ACETBLR 52 OD PINNACLE (Hips) ×2 IMPLANT
DERMABOND ADVANCED (GAUZE/BANDAGES/DRESSINGS) ×2
DERMABOND ADVANCED .7 DNX12 (GAUZE/BANDAGES/DRESSINGS) ×1 IMPLANT
DRAPE STERI IOBAN 125X83 (DRAPES) ×3 IMPLANT
DRAPE U-SHAPE 47X51 STRL (DRAPES) ×6 IMPLANT
DRESSING AQUACEL AG SP 3.5X10 (GAUZE/BANDAGES/DRESSINGS) ×1 IMPLANT
DRSG AQUACEL AG SP 3.5X10 (GAUZE/BANDAGES/DRESSINGS) ×3
DURAPREP 26ML APPLICATOR (WOUND CARE) ×3 IMPLANT
ELECT REM PT RETURN 15FT ADLT (MISCELLANEOUS) ×3 IMPLANT
ELIMINATOR HOLE APEX DEPUY (Hips) ×2 IMPLANT
GLOVE BIO SURGEON STRL SZ 6 (GLOVE) ×6 IMPLANT
GLOVE BIOGEL PI IND STRL 6.5 (GLOVE) ×1 IMPLANT
GLOVE BIOGEL PI IND STRL 7.5 (GLOVE) ×1 IMPLANT
GLOVE BIOGEL PI IND STRL 8.5 (GLOVE) ×1 IMPLANT
GLOVE BIOGEL PI INDICATOR 6.5 (GLOVE) ×2
GLOVE BIOGEL PI INDICATOR 7.5 (GLOVE) ×2
GLOVE BIOGEL PI INDICATOR 8.5 (GLOVE) ×2
GLOVE ECLIPSE 8.0 STRL XLNG CF (GLOVE) ×6 IMPLANT
GLOVE ORTHO TXT STRL SZ7.5 (GLOVE) ×6 IMPLANT
GOWN STRL REUS W/TWL LRG LVL3 (GOWN DISPOSABLE) ×6 IMPLANT
GOWN STRL REUS W/TWL XL LVL3 (GOWN DISPOSABLE) ×3 IMPLANT
HEAD M SROM 36MM PLUS 1.5 (Hips) ×1 IMPLANT
HOLDER FOLEY CATH W/STRAP (MISCELLANEOUS) ×3 IMPLANT
KIT TURNOVER KIT A (KITS) IMPLANT
LINER NEUTRAL 52X36MM PLUS 4 (Liner) ×2 IMPLANT
PACK ANTERIOR HIP CUSTOM (KITS) ×3 IMPLANT
PENCIL SMOKE EVACUATOR (MISCELLANEOUS) IMPLANT
SCREW 6.5MMX30MM (Screw) ×2 IMPLANT
SROM M HEAD 36MM PLUS 1.5 (Hips) ×3 IMPLANT
STEM FEMORAL SZ 6MM STD ACTIS (Stem) ×3 IMPLANT
SUT MNCRL AB 4-0 PS2 18 (SUTURE) ×3 IMPLANT
SUT STRATAFIX 0 PDS 27 VIOLET (SUTURE) ×3
SUT VIC AB 1 CT1 36 (SUTURE) ×9 IMPLANT
SUT VIC AB 2-0 CT1 27 (SUTURE) ×6
SUT VIC AB 2-0 CT1 TAPERPNT 27 (SUTURE) ×2 IMPLANT
SUTURE STRATFX 0 PDS 27 VIOLET (SUTURE) ×1 IMPLANT
TRAY FOLEY MTR SLVR 16FR STAT (SET/KITS/TRAYS/PACK) IMPLANT
WATER STERILE IRR 1000ML POUR (IV SOLUTION) ×3 IMPLANT
YANKAUER SUCT BULB TIP 10FT TU (MISCELLANEOUS) IMPLANT

## 2019-11-04 NOTE — Progress Notes (Signed)
Orthopedic Tech Progress Note Patient Details:  [name] 1941/04/17 [id number]  Ortho Devices Ortho Device/Splint Location: Trapeze bar Ortho Device/Splint Interventions: Application   Post Interventions Patient Tolerated: Well Instructions Provided: Care of device   [name] 11/04/2019, 8:36 AM

## 2019-11-04 NOTE — Progress Notes (Signed)
PT Cancellation Note  Patient Details Name: [name] MRN: [id number] DOB: 04-29-1941   Cancelled Treatment:    Reason Eval/Treat Not Completed: Medical issues which prohibited therapy Pelvic x-ray showing right femoral neck fracture.  Possible surgery today.  Will await further plan of care.   LEMYRE,KATHrine E 11/04/2019, 8:36 AM Jannette Spanner PT, DPT Acute Rehabilitation Services Office: [phone]

## 2019-11-04 NOTE — Telephone Encounter (Signed)
   I spoke please tell the patient/family that I did speak with the bedside physician and to let them know that she is under the excellent care of orthopedic and hospitalist service.  We wish her the best for a speedy recovery  xxxxxxxxxxxxxxxxxxxxxxxxxxxxxxxxx # D/w Dr Teryl Lucy bedside MD - she can use the below pre-op risk assessment scores and if concnered can call pulm consult   1) RISK FOR PROLONGED MECHANICAL VENTILAION - > 48h  1A) Arozullah - Prolonged mech ventilation risk Arozullah Postperative Pulmonary Risk Score - for mech ventilation dependence >48h [employer], [place] [facility] 2000, major non-cardiac surgery) Comment Score  Type of surgery - abd ao aneurysm (27), thoracic (21), neurosurgery / upper abdominal / vascular (21), neck (11)  *  Emergency Surgery - (11)    ALbumin < 3 or poor nutritional state - (9)    BUN > 30 -  (8)    Partial or completely dependent functional status - (7)    COPD -  (6)    Age - 94 to 91 (4), > 70  (6)    TOTAL    Risk Stratifcation scores  - < 10 (0.5%), 11-19 (1.8%), 20-27 (4.2%), 28-40 (10.1%), >40 (26.6%)        1B) GUPTA - Prolonged Mech Vent Risk Score source Risk  Guptal post op prolonged mech ventilation > 48h or reintubation < 30 days - ACS 2007-2008 dataset - http://lewis-perez.info/     2) RISK FOR POST OP PNEUMONIA Score source Risk  Lyndel Safe - Post Op Pnemounia risk  [employer].co.za     R3) ISK FOR ANY POST-OP PULMONARY COMPLICATION Score source Risk  CANET/ARISCAT Score - risk for ANY/ALl pulmonary complications - > risk of in-hospital post-op pulmonary complications (composite including respiratory failure, respiratory infection, pleural effusion, atelectasis, pneumothorax, bronchospasm, aspiration pneumonitis) [employer] - based on age, anemia, pulse ox, resp infection prior 30d,  incision site, duration of surgery, and emergency v elective surgery

## 2019-11-04 NOTE — Op Note (Signed)
NAME:  DHRUTI GHUMAN NO.: [id number]      MEDICAL RECORD NO.: [id number]      FACILITY:  [facility]      PHYSICIAN:  Mauri Pole  DATE OF BIRTH:  1940-10-30     DATE OF PROCEDURE:  11/04/2019                                 OPERATIVE REPORT         PREOPERATIVE DIAGNOSIS: Right hip displaced femoral neck fracture.      POSTOPERATIVE DIAGNOSIS:  Right hip displaced femoral neck fracture.      PROCEDURE:  Right total hip replacement through an anterior approach   utilizing DePuy THR system, component size 52 mm pinnacle cup, a size 36+4 neutral   Altrex liner, a size 6 standard Actis stem with a 36+1.5 Articuleze metal head ball.      SURGEON:  Pietro Cassis. Alvan Dame, M.D.      ASSISTANT:  Danae Orleans, PA-C     ANESTHESIA:  Spinal.      SPECIMENS:  None.      COMPLICATIONS:  None.      BLOOD LOSS:  200 cc     DRAINS:  None.      INDICATION OF THE PROCEDURE:  [name] is a 79 y.o. female who had   presented to office for evaluation of right hip pain.  She unfortunately had a ground level fall on to her right hip with immediate onset of pain, shortening, and inability to bear weight.  She was brought to the ER where right hip fracture was diagnosed.  Based on a history of helping her husband in the past she and her family requested I get involved to treat the femoral neck fracture.  We discussed the indication for the surgery and then options of hemi versus total hp replacement.  Pros and cons of both focused on the potential for persistent pain related to the hemiarthroplasty ball versus progression of arthritis related to cartilage breakdown.  Consent was obtained for   benefit of pain relief and performing a total hip replacement.  Specific risks of infection, DVT, component   failure, dislocation, neurovascular injury, and need for revision surgery were reviewed in the office as well discussion of   the anterior versus posterior  approach were reviewed.     PROCEDURE IN DETAIL:  The patient was brought to operative theater.   Once adequate anesthesia, preoperative antibiotics, 2 gm of Ancef, 1 gm of Tranexamic Acid, and 10 mg of Decadron were administered, the patient was positioned supine on the [employer] table.  Once the patient was safely positioned with adequate padding of boney prominences we predraped out the hip, and used fluoroscopy to confirm orientation of the pelvis.      The right hip was then prepped and draped from proximal iliac crest to   mid thigh with a shower curtain technique.      Time-out was performed identifying the patient, planned procedure, and the appropriate extremity.     An incision was then made 2 cm lateral to the   anterior superior iliac spine extending over the orientation of the   tensor fascia lata muscle and sharp dissection was carried down to the   fascia of the muscle.  The fascia was then incised.  The muscle belly was identified and swept   laterally and retractor placed along the superior neck.  Following   cauterization of the circumflex vessels and removing some pericapsular   fat, a second cobra retractor was placed on the inferior neck.  A T-capsulotomy was made along the line of the   superior neck to the trochanteric fossa, then extended proximally and   distally.  Tag sutures were placed and the retractors were then placed   intracapsular.  We then identified the trochanteric fossa and   orientation of my neck cut and then made a neck osteotomy with the femur on traction.  The fracture femoral neck segments and the femoral   head were removed without difficulty or complication.  Traction was let   off and retractors were placed posterior and anterior around the   acetabulum.      The labrum and foveal tissue were debrided.  I began reaming with a 43 mm   reamer and reamed up to 51 mm reamer with good bony bed preparation and a 52 mm  cup was chosen.  The  final 52 mm Pinnacle cup was then impacted under fluoroscopy to confirm the depth of penetration and orientation with respect to   Abduction and forward flexion.  A screw was placed into the ilium followed by the hole eliminator.  The final   36+4 neutral Altrex liner was impacted with good visualized rim fit.  The cup was positioned anatomically within the acetabular portion of the pelvis.      At this point, the femur was rolled to 100 degrees.  Further capsule was   released off the inferior aspect of the femoral neck.  I then   released the superior capsule proximally.  With the leg in a neutral position the hook was placed laterally   along the femur under the vastus lateralis origin and elevated manually and then held in position using the hook attachment on the bed.  The leg was then extended and adducted with the leg rolled to 100   degrees of external rotation.  Retractors were placed along the medial calcar and posteriorly over the greater trochanter.  Once the proximal femur was fully   exposed, I used a box osteotome to set orientation.  I then began   broaching with the starting chili pepper broach and passed this by hand and then broached up to 6.  With the 6 broach in place I chose a standard neck and did several trial reductions.  The offset was appropriate, leg lengths   appeared to be equal best matched with the +1.5 head ball trial confirmed radiographically.   Given these findings, I went ahead and dislocated the hip, repositioned all   retractors and positioned the right hip in the extended and abducted position.  The final 6 standard Actis stem was   chosen and it was impacted down to the level of neck cut.  Based on this   and the trial reductions, a final 36+1.6 Articuleze metal head ball was chosen and   impacted onto a clean and dry trunnion, and the hip was reduced.  The   hip had been irrigated throughout the case again at this point.  I did   reapproximate the superior  capsular leaflet to the anterior leaflet   using #1 Vicryl.  The fascia of the   tensor fascia lata muscle was then reapproximated using #1 Vicryl and #0 Stratafix sutures.  The   remaining wound was closed with 2-0 Vicryl and running 4-0 Monocryl.   The hip was cleaned, dried, and dressed sterilely using Dermabond and   Aquacel dressing.  The patient was then brought   to recovery room in stable condition tolerating the procedure well.    Danae Orleans, PA-C was present for the entirety of the case involved from   preoperative positioning, perioperative retractor management, general   facilitation of the case, as well as primary wound closure as assistant.            Pietro Cassis Alvan Dame, M.D.        11/04/2019 12:12 PM

## 2019-11-04 NOTE — H&P (View-Only) (Signed)
Reason for Consult:  Right femoral neck fracture Referring Physician:  ED Provider  [name] is an 79 y.o. female.  HPI: [name] is a 79 y.o. female presented to the ER with a chief complaint of right hip pain after fall that occurred yesterday.  States that she was at home getting out of her chair at approximately 2 AM when she fell.  States that she laid there for about 12 hours until her son found her just prior to arrival to the ER.  States that she was unable to get up due to pain in her right leg.  She denies any head injury, loss of consciousness or anticoagulant use.  States that she did not fall due to dizziness or lightheadedness but she also does not endorse a mechanical fall.  States a similar thing happened to her 2 years ago, but she never knew why.  She denies any vision changes, vomiting, numbness in arms or legs, saddle anesthesia, chest pain, shortness of breath, loss of bowel or bladder function. She lives at home by herself.  She usually ambulates without assistance, but will sometimes use a cane per her kids request.  Ortho consulted and Dr. Alvan Dame discussed the procedure, risks, benefits and expectations.  She wishes to proceed with a right total hip arthroplasty per Dr. Alvan Dame.  Past Medical History:  Diagnosis Date  . Bronchiectasis    PFTs August 23, 2010 VC 82%   dlco 62 > 129 CORRECTED  . Chronic diarrhea   . Chronic rhinitis    sinus CT ordered August 23, 2010  . Diabetes mellitus ([place])   . Hyperplastic colon polyp   . IBS (irritable bowel syndrome)   . ILD (interstitial lung disease) ([place])   . Stenosis of rectum and anus   . Thyroid disease     Past Surgical History:  Procedure Laterality Date  .  gallbaldder    . CHOLECYSTECTOMY    . DILATION AND CURETTAGE OF UTERUS    . HAND SURGERY  1991   left hand; Baker's Cyst removed  . migraine    . tear duct surgery      Family History  Problem Relation Age of Onset  . Colon polyps Sister   . Heart  disease Sister   . Diabetes Sister   . Heart disease Mother   . Colon cancer Neg Hx     Social History:  reports that she quit smoking about 43 years ago. Her smoking use included cigarettes. She has a 45.00 pack-year smoking history. She has never used smokeless tobacco. She reports that she does not drink alcohol or use drugs.  Allergies:  Allergies  Allergen Reactions  . Penicillins Diarrhea    Has patient had a PCN reaction causing immediate rash, facial/tongue/throat swelling, SOB or lightheadedness with hypotension: Unknown Has patient had a PCN reaction causing severe rash involving mucus membranes or skin necrosis: Unknown Has patient had a PCN reaction that required hospitalization: No  Has patient had a PCN reaction occurring within the last 10 years: No  If all of the above answers are "NO", then may proceed with Cephalosporin use.   . Influenza Vaccines Other (See Comments)    Severe nausea, vomiting and body aches-md told her not to take it anymore  . Clarithromycin     Unknown reaction   . Cortisone Other (See Comments)    "Made her feel like ice water was running through her veins"   . Povidone-Iodine  Unknown reaction     Medications: I have reviewed the patient's current medications.  Results for orders placed or performed during the hospital encounter of 11/03/19 (from the past 48 hour(s))  Comprehensive metabolic panel     Status: Abnormal   Collection Time: 11/03/19  4:13 PM  Result Value Ref Range   Sodium 137 135 - 145 mmol/L   Potassium 3.9 3.5 - 5.1 mmol/L   Chloride 102 98 - 111 mmol/L   CO2 27 22 - 32 mmol/L   Glucose, Bld 142 (H) 70 - 99 mg/dL    Comment: Glucose reference range applies only to samples taken after fasting for at least 8 hours.   BUN 11 8 - 23 mg/dL   Creatinine, Ser 0.61 0.44 - 1.00 mg/dL   Calcium 9.3 8.9 - 10.3 mg/dL   Total Protein 7.8 6.5 - 8.1 g/dL   Albumin 2.9 (L) 3.5 - 5.0 g/dL   AST 22 15 - 41 U/L   ALT 14 0 - 44  U/L   Alkaline Phosphatase 79 38 - 126 U/L   Total Bilirubin 0.4 0.3 - 1.2 mg/dL   GFR calc non Af Amer >60 >60 mL/min   GFR calc Af Amer >60 >60 mL/min   Anion gap 8 5 - 15    Comment: Performed at [facility], [place] [address], [place], [place] [address]  CBC with Differential     Status: Abnormal   Collection Time: 11/03/19  4:13 PM  Result Value Ref Range   WBC 9.0 4.0 - 10.5 K/uL   RBC 3.63 (L) 3.87 - 5.11 MIL/uL   Hemoglobin 10.0 (L) 12.0 - 15.0 g/dL   HCT 32.2 (L) 36.0 - 46.0 %   MCV 88.7 80.0 - 100.0 fL   MCH 27.5 26.0 - 34.0 pg   MCHC 31.1 30.0 - 36.0 g/dL   RDW 14.7 11.5 - 15.5 %   Platelets 313 150 - 400 K/uL   nRBC 0.0 0.0 - 0.2 %   Neutrophils Relative % 82 %   Neutro Abs 7.4 1.7 - 7.7 K/uL   Lymphocytes Relative 14 %   Lymphs Abs 1.2 0.7 - 4.0 K/uL   Monocytes Relative 4 %   Monocytes Absolute 0.4 0.1 - 1.0 K/uL   Eosinophils Relative 0 %   Eosinophils Absolute 0.0 0.0 - 0.5 K/uL   Basophils Relative 0 %   Basophils Absolute 0.0 0.0 - 0.1 K/uL   Immature Granulocytes 0 %   Abs Immature Granulocytes 0.03 0.00 - 0.07 K/uL    Comment: Performed at [facility], [place] [address] [place], [place] [address]  CK     Status: None   Collection Time: 11/03/19  4:13 PM  Result Value Ref Range   Total CK 147 38 - 234 U/L    Comment: Performed at [facility], [place] [address], [place], [place] [address]  SARS Coronavirus 2 by RT PCR (hospital order, performed in [facility] hospital lab) Nasopharyngeal Nasopharyngeal Swab     Status: None   Collection Time: 11/03/19  7:30 PM   Specimen: Nasopharyngeal Swab  Result Value Ref Range   SARS Coronavirus 2 NEGATIVE NEGATIVE    Comment: (NOTE) SARS-CoV-2 target nucleic acids are NOT DETECTED. The SARS-CoV-2 RNA is generally detectable in upper and lower respiratory specimens during the acute phase of infection. The lowest concentration of SARS-CoV-2 viral copies this assay  can detect is 250 copies / mL. A negative result does  not preclude SARS-CoV-2 infection and should not be used as the sole basis for treatment or other patient management decisions.  A negative result may occur with improper specimen collection / handling, submission of specimen other than nasopharyngeal swab, presence of viral mutation(s) within the areas targeted by this assay, and inadequate number of viral copies (<250 copies / mL). A negative result must be combined with clinical observations, patient history, and epidemiological information. Fact Sheet for Patients:   StrictlyIdeas.no Fact Sheet for Healthcare Providers: BankingDealers.co.za This test is not yet approved or cleared  by the [place] FDA and has been authorized for detection and/or diagnosis of SARS-CoV-2 by FDA under an Emergency Use Authorization (EUA).  This EUA will remain in effect (meaning this test can be used) for the duration of the COVID-19 declaration under Section 564(b)(1) of the Act, 21 U.S.C. section 360bbb-3(b)(1), unless the authorization is terminated or revoked sooner. Performed at [facility], [place] [address] [place], [place] [address]   Protime-INR     Status: None   Collection Time: 11/03/19  9:26 PM  Result Value Ref Range   Prothrombin Time 14.2 11.4 - 15.2 seconds   INR 1.1 0.8 - 1.2    Comment: (NOTE) INR goal varies based on device and disease states. Performed at [facility], [place] [address], [place], [place] [address]   Type and screen [place]     Status: None   Collection Time: 11/03/19  9:26 PM  Result Value Ref Range   ABO/RH(D) B POS    Antibody Screen NEG    Sample Expiration      11/06/2019,2359 Performed at [facility], [place] [address] [place], [place] [address]   ABO/Rh     Status: None   Collection Time: 11/03/19  9:26 PM  Result  Value Ref Range   ABO/RH(D)      B POS Performed at [facility], [place] [address] [place], [place] [address]   CBC     Status: Abnormal   Collection Time: 11/04/19  3:13 AM  Result Value Ref Range   WBC 8.1 4.0 - 10.5 K/uL   RBC 3.57 (L) 3.87 - 5.11 MIL/uL   Hemoglobin 9.7 (L) 12.0 - 15.0 g/dL   HCT 32.2 (L) 36.0 - 46.0 %   MCV 90.2 80.0 - 100.0 fL   MCH 27.2 26.0 - 34.0 pg   MCHC 30.1 30.0 - 36.0 g/dL   RDW 15.1 11.5 - 15.5 %   Platelets 247 150 - 400 K/uL   nRBC 0.0 0.0 - 0.2 %    Comment: Performed at [facility], [place] [address], [place],  [address]  Basic metabolic panel     Status: Abnormal   Collection Time: 11/04/19  3:13 AM  Result Value Ref Range   Sodium 134 (L) 135 - 145 mmol/L   Potassium 3.7 3.5 - 5.1 mmol/L   Chloride 102 98 - 111 mmol/L   CO2 25 22 - 32 mmol/L   Glucose, Bld 186 (H) 70 - 99 mg/dL    Comment: Glucose reference range applies only to samples taken after fasting for at least 8 hours.   BUN 11 8 - 23 mg/dL   Creatinine, Ser 0.59 0.44 - 1.00 mg/dL   Calcium 8.5 (L) 8.9 - 10.3 mg/dL   GFR calc non Af Amer >60 >60 mL/min   GFR calc Af Amer >60 >60 mL/min   Anion gap  7 5 - 15    Comment: Performed at [facility], [place] [address], [place], [place] [address]    DG Pelvis Portable  Result Date: 11/03/2019 CLINICAL DATA:  Status post fall. EXAM: PORTABLE PELVIS 1-2 VIEWS COMPARISON:  None. FINDINGS: Acute fracture deformity is seen extending through the neck of the proximal right femur. No significant displacement of the proximal and distal fracture sites is seen. There is no evidence of dislocation. No pelvic bone lesions are seen. IMPRESSION: Acute fracture of the proximal right femur. Electronically Signed   By: Virgina Norfolk M.D.   On: 11/03/2019 17:01   DG Chest Portable 1 View  Result Date: 11/03/2019 CLINICAL DATA:  Status post fall. EXAM: PORTABLE CHEST 1 VIEW COMPARISON:   May 13, 2018 FINDINGS: Mild-to-moderate severity diffuse, chronic appearing increased interstitial lung markings are seen. Mild biapical pleural thickening is noted. Mild atelectasis and/or infiltrate is seen within the left lung base and along the periphery of the right upper lobe. A 5.6 cm x 6.2 cm round patchy opacity is seen overlying the mid to upper left lung. This represents a new finding when compared to the prior exam. There is no evidence of a pleural effusion or pneumothorax. The heart size and mediastinal contours are within normal limits. The visualized skeletal structures are unremarkable. IMPRESSION: 1. Mild atelectasis and/or infiltrate within the left lung base and along the periphery of the right upper lobe. 2. New 5.6 cm x 6.2 cm round patchy opacity overlying the mid to upper left lung. While this may be infectious in etiology, the presence of a large left lung mass cannot be excluded. Correlation with chest CT is recommended. Electronically Signed   By: Virgina Norfolk M.D.   On: 11/03/2019 17:05    Review of Systems  Constitutional: Negative.   HENT: Negative.   Eyes: Negative.   Respiratory: Negative.   Cardiovascular: Negative.   Gastrointestinal: Positive for diarrhea.  Genitourinary: Negative.   Musculoskeletal: Positive for joint pain.  Skin: Negative.   Neurological: Negative.   Endo/Heme/Allergies: Positive for environmental allergies.  Psychiatric/Behavioral: Negative.    Blood pressure 129/65, pulse 87, temperature 98.3 F (36.8 C), resp. rate 18, height 5\' 6"  (1.676 m), weight 70.7 kg, SpO2 91 %. Physical Exam  Constitutional: She is oriented to person, place, and time. She appears well-developed.  HENT:  Head: Normocephalic.  Eyes: Pupils are equal, round, and reactive to light.  Neck: No JVD present. No tracheal deviation present. No thyromegaly present.  Cardiovascular: Normal rate, regular rhythm and intact distal pulses.  Respiratory: Effort normal  and breath sounds normal. No respiratory distress. She has no wheezes.  GI: Soft. There is no abdominal tenderness. There is no guarding.  Musculoskeletal:     Cervical back: Neck supple.     Right hip: Swelling, tenderness and bony tenderness present. Decreased range of motion. Decreased strength.  Lymphadenopathy:    She has no cervical adenopathy.  Neurological: She is alert and oriented to person, place, and time.  Skin: Skin is warm and dry.  Psychiatric: She has a normal mood and affect.    Assessment/Plan: Right femoral neck fracture   Admitted to medicine  NPO now  Plan for OR today for a right THA to repair the femoral neck fx  Urgent order set placed for Dr. Alvan Dame to preform the surgery  Risks, benefits and expectations were discussed with the patient.  Risks including but not limited to the risk of anesthesia, blood clots, nerve  damage, blood vessel damage, failure of the prosthesis, infection and up to and including death.  Patient understand the risks, benefits and expectations and wishes to proceed with surgery.    Lucille Passy [facility] 11/04/2019, 8:48 AM

## 2019-11-04 NOTE — Telephone Encounter (Signed)
Patient son Shonica Weier - on patient's health care POA came to the office asking to speak to a manager. I spoke with him and he was upset stating that no one called him back prior to his mother going into surgery. He stated he wanted to know if there were any risk factors regarding putting his mother to sleep for the surgery. He said its too late now, pt was currently in surgery. He does want to talk to Dr. [name] about patient's lack of vaccination for COVID, he said that his mother allergic reactions to other vaccines, may not be true reactions, and he would like to speak to Dr. [name] to determine if there were any medical reasons why his mother shouldn't get the vaccine. Also, he states that patient was to have a biopsy, and was to have testing or imaging done for this procedure, he is wanting to know if any of those tests or images are still needed can it be done while the patient is currently inpatient at the hospital. He can be reached at [phone]. -pr

## 2019-11-04 NOTE — Anesthesia Preprocedure Evaluation (Addendum)
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 on medications + CAD  Normal cardiovascular exam Rhythm:Regular Rate:Normal     Neuro/Psych PSYCHIATRIC DISORDERS Depression negative neurological ROS     GI/Hepatic Neg liver ROS, GERD  ,  Endo/Other  diabetes, Type 2, Oral Hypoglycemic AgentsHypothyroidism   Renal/GU negative Renal ROS     Musculoskeletal right femoral neck fracture   Abdominal   Peds  Hematology  (+) Blood dyscrasia, anemia , Plt 247k   Anesthesia Other Findings   Reproductive/Obstetrics                            Anesthesia Physical Anesthesia Plan  ASA: IV  Anesthesia Plan: Spinal   Post-op Pain Management:    Induction: Intravenous  PONV Risk Score and Plan: 2 and Propofol infusion and Treatment may vary due to age or medical condition  Airway Management Planned: Natural Airway and Nasal Cannula  Additional Equipment:   Intra-op Plan:   Post-operative Plan:   Informed Consent: I have reviewed the patients History and Physical, chart, labs and discussed the procedure including the risks, benefits and alternatives for the proposed anesthesia with the patient or authorized representative who has indicated his/her understanding and acceptance.     Dental advisory given  Plan Discussed with: CRNA  Anesthesia Plan Comments:         Anesthesia Quick Evaluation

## 2019-11-04 NOTE — Progress Notes (Signed)
Initial Nutrition Assessment  INTERVENTION:   -Ensure Enlive po BID, each supplement provides 350 kcal and 20 grams of protein  NUTRITION DIAGNOSIS:   Increased nutrient needs related to hip fracture, post-op healing as evidenced by estimated needs.  GOAL:   Patient will meet greater than or equal to 90% of their needs  MONITOR:   Supplement acceptance, Labs, PO intake, Weight trends, I & O's  REASON FOR ASSESSMENT:   Malnutrition Screening Tool    ASSESSMENT:   79 y.o. female presented to the ER with a chief complaint of right hip pain after fall that occurred yesterday.  States that she was at home getting out of her chair at approximately 2 AM when she fell.  States that she laid there for about 12 hours until her son found her just prior to arrival to the ER. Admitted for hip fracture.  Patient in OR currently for planned total hip arthroplasty. NPO. Per chart review, pt admitted after a fall. Pt will benefit from nutritional supplements following surgery, will order Ensure for additional kcals and protein.  Per chart, pt reported 53 lb weight loss since Nov 2019. Per weight records,  Pt has lost 32 lbs since February 2020 (17% wt loss x >1 year which is insignificant for time frame).   I/Os: -300 ml since admit UOP: 400 ml x 24 hrs   Medications: vitamin C, colace, Multivitamin with minerals daily, Lactated ringers infusion Labs reviewed: Low Na   NUTRITION - FOCUSED PHYSICAL EXAM:  Pt in surgery.  Diet Order:   Diet Order            Diet NPO time specified  Diet effective now              EDUCATION NEEDS:   No education needs have been identified at this time  Skin:  Skin Assessment: Reviewed RN Assessment  Last BM:  5/24  Height:   Ht Readings from Last 1 Encounters:  11/04/19 5\' 6"  (1.676 m)    Weight:   Wt Readings from Last 1 Encounters:  11/04/19 70.7 kg    Ideal Body Weight:     BMI:  Body mass index is 25.16 kg/m.  Estimated  Nutritional Needs:   Kcal:  1700-1900  Protein:  75-85g  Fluid:  1.7L/day  Clayton Bibles, MS, RD, LDN Inpatient Clinical Dietitian Contact information available via Amion

## 2019-11-04 NOTE — Anesthesia Postprocedure Evaluation (Signed)
Anesthesia Post Note  Patient: [name]  Procedure(s) Performed: TOTAL HIP ARTHROPLASTY ANTERIOR APPROACH (Right Hip)     Patient location during evaluation: PACU Anesthesia Type: Spinal Level of consciousness: oriented and awake and alert Pain management: pain level controlled Vital Signs Assessment: post-procedure vital signs reviewed and stable Respiratory status: spontaneous breathing, respiratory function stable and patient connected to nasal cannula oxygen Cardiovascular status: blood pressure returned to baseline and stable Postop Assessment: no headache, no backache, no apparent nausea or vomiting and spinal receding Anesthetic complications: no    Last Vitals:  Vitals:   11/04/19 1554 11/04/19 1702  BP: (!) 112/58 111/66  Pulse: 88 90  Resp: 16 14  Temp: 36.5 C 36.7 C  SpO2: 100% 100%    Last Pain:  Vitals:   11/04/19 1702  TempSrc: Oral  PainSc:                  Catalina Gravel

## 2019-11-04 NOTE — Interval H&P Note (Signed)
History and Physical Interval Note:  11/04/2019 11:49 AM  [name]  has presented today for surgery, with the diagnosis of right femoral neck fracture.  The various methods of treatment have been discussed with the patient and family. After consideration of risks, benefits and other options for treatment, the patient has consented to  Procedure(s): TOTAL HIP ARTHROPLASTY ANTERIOR APPROACH (Right) as a surgical intervention.  The patient's history has been reviewed, patient examined, no change in status, stable for surgery.  I have reviewed the patient's chart and labs.  Questions were answered to the patient's satisfaction.     Mauri Pole

## 2019-11-04 NOTE — Progress Notes (Signed)
[facility]  PROGRESS NOTE  [name] DXI:[id number] DOB: Mar 22, 1941 DOA: 11/03/2019 PCP: [name], MD Admit date - 11/03/2019   Admitting Physician Harvie Bridge, DO  Outpatient Primary MD for the patient is [name], MD  LOS - 1 Brief Narrative   [name] is a 79 y.o. year old female with medical history significant for hypothyroidism, chronic interstitial lung disease who presented on 11/03/2019 with right hip pain after falling while getting out of her chair and landing on her right hip with immediate onset of pain, shortening and inability to bear weight.  Denies any preceding symptoms of chest pain, palpitation, shortness of breath, vision changes.    In the ED, patient was afebrile hemodynamically stable, Covid test negative, lab work notable for hemoglobin of 10, CK 147.  Chest x-ray showed mild atelectasis with infiltrate within the left lung base and periphery right upper lobe, new mild patchy opacity overlying the mid to upper left lung.  Pelvic x-ray showed acute fracture of proximal right femur.  Patient was admitted for right femur neck fracture under [facility] management.  Patient underwent right hip replacement by Dr. Alvan Dame on 5/26. Subjective  Today feels right hip pain is stable.  Denies any shortness of breath.  Hesitant to do any therapy, does not want to go to rehab facility [facility]   Right hip displaced femoral neck fracture after ground-level fall.  Status post right hip replacement by Dr. Alvan Dame on 5/26 -IV Dilaudid as needed severe breakthrough pain -Norco/Vicodin every 4 hours as needed moderate pain -Hydrocodone/acetaminophen every 4 hours as needed severe pain-Robaxin as needed muscle spasms -Antiemetics as needed -Colace twice daily  Unwitnessed fall from chair.  Patient was down for at least 12 hours per her report.  CK unremarkable.  Status post right hip repair.  Denied any preceding symptoms reports prior history of similar fall. -PT/OT eval  after surgery  --Check TSH   Hypothyroidism --Check TSH -Continue Synthroid  Left lung base mass measuring 7.1 x 5.9 cm with mediastinal adenophathyCXR on admission showed concern for potential left upper lobe mass.  CT chest shows interval development of left upper lobe opacity extending into the left hilum concerning for either pneumonia versus neoplasm with interval development of mediastinal lymphadenopathy with background of chronic interstitial lung disease. -Followed by Dr. [name] (pulmonology) outpatient, have discussed biopsy at that time patient wantedto defer due to patient's husband's recent death  Chronic interstitial lung disease. Uses 2 L [place] O2 intermittently at home per family. -continue O2 here -closely monitor  Type 2 diabetes. CBGs  140s-180s -Holding home pioglitazone --check A1C, change to diabetic diet -CBG with sliding scale as needed.   Hyperlipidemia, stable -Continue pravastatin     Family Communication  :  Updated daughter and son at bedside  Code Status :  FULL  Disposition Plan  :  Patient is from home. Anticipated d/c date: 24-48 hours. Barriers to d/c or necessity for inpatient status: PT/OT eval given fracture repair, pain control Consults  :  ortho  Procedures  :  Total right hip arthroplasty by Dr. Alvan Dame on 5/26  DVT Prophylaxis  :  Defer to ortho, currently on aspirin   Lab Results  Component Value Date   PLT 247 11/04/2019    Diet :  Diet Order            Diet regular Room service appropriate? Yes; Fluid consistency: Thin  Diet effective now  Inpatient Medications Scheduled Meds: . vitamin C  1,000 mg Oral QHS  . [START ON 11/05/2019] aspirin EC  325 mg Oral BID  . celecoxib  200 mg Oral BID  . Chlorhexidine Gluconate Cloth  6 each Topical Daily  . docusate sodium  100 mg Oral BID  . feeding supplement (ENSURE ENLIVE)  237 mL Oral BID BM  . [START ON 11/05/2019] ferrous sulfate  325 mg Oral TID PC  .  levothyroxine  88 mcg Oral QAC breakfast  . multivitamin with minerals  1 tablet Oral Daily  . pravastatin  40 mg Oral Daily   Continuous Infusions: .  ceFAZolin (ANCEF) IV    . methocarbamol (ROBAXIN) IV     PRN Meds:.[START ON 11/05/2019] acetaminophen, benzonatate, HYDROcodone-acetaminophen, HYDROcodone-acetaminophen, HYDROmorphone (DILAUDID) injection, menthol-cetylpyridinium **OR** phenol, methocarbamol **OR** methocarbamol (ROBAXIN) IV, metoCLOPramide **OR** metoCLOPramide (REGLAN) injection, ondansetron **OR** ondansetron (ZOFRAN) IV  Antibiotics  :   Anti-infectives (From admission, onward)   Start     Dose/Rate Route Frequency Ordered Stop   11/04/19 1800  ceFAZolin (ANCEF) IVPB 2g/100 mL premix     2 g 200 mL/hr over 30 Minutes Intravenous Every 6 hours 11/04/19 1515 11/05/19 0559   11/04/19 1115  ceFAZolin (ANCEF) IVPB 2g/100 mL premix     2 g 200 mL/hr over 30 Minutes Intravenous On call to O.R. 11/04/19 1106 11/04/19 1158       Objective   Vitals:   11/04/19 1500 11/04/19 1511 11/04/19 1554 11/04/19 1702  BP: (!) 127/56 116/67 (!) 112/58 111/66  Pulse: 83 82 88 90  Resp: 16 13 16 14   Temp:  97.6 F (36.4 C) 97.7 F (36.5 C) 98.1 F (36.7 C)  TempSrc:  Oral Oral Oral  SpO2: 99% 100% 100% 100%  Weight:      Height:        SpO2: 100 % O2 Flow Rate (L/min): 2 L/min  Wt Readings from Last 3 Encounters:  11/04/19 70.7 kg  08/14/19 72.6 kg  07/31/18 85.1 kg     Intake/Output Summary (Last 24 hours) at 11/04/2019 1712 Last data filed at 11/04/2019 1316 Gross per 24 hour  Intake 1100 ml  Output 900 ml  Net 200 ml    Physical Exam:     Resting in bed comfortably No new F.N deficits,  [place].AT, Regular rate and rhythm, no peripheral edema Abdomen soft, nondistended, nontender Normal respiratory effort on 2 L [place], no wheezing or rhonchi Right hip with postoperative dressing in place (clear, dry, intact)     I have personally reviewed the following:     Data Reviewed:  CBC Recent Labs  Lab 11/03/19 1613 11/04/19 0313  WBC 9.0 8.1  HGB 10.0* 9.7*  HCT 32.2* 32.2*  PLT 313 247  MCV 88.7 90.2  MCH 27.5 27.2  MCHC 31.1 30.1  RDW 14.7 15.1  LYMPHSABS 1.2  --   MONOABS 0.4  --   EOSABS 0.0  --   BASOSABS 0.0  --     Chemistries  Recent Labs  Lab 11/03/19 1613 11/04/19 0313  NA 137 134*  K 3.9 3.7  CL 102 102  CO2 27 25  GLUCOSE 142* 186*  BUN 11 11  CREATININE 0.61 0.59  CALCIUM 9.3 8.5*  AST 22  --   ALT 14  --   ALKPHOS 79  --   BILITOT 0.4  --    ------------------------------------------------------------------------------------------------------------------ No results for input(s): CHOL, HDL, LDLCALC, TRIG, CHOLHDL, LDLDIRECT in the last 72 hours.  No results found for: HGBA1C ------------------------------------------------------------------------------------------------------------------ No results for input(s): TSH, T4TOTAL, T3FREE, THYROIDAB in the last 72 hours.  Invalid input(s): FREET3 ------------------------------------------------------------------------------------------------------------------ No results for input(s): VITAMINB12, FOLATE, FERRITIN, TIBC, IRON, RETICCTPCT in the last 72 hours.  Coagulation profile Recent Labs  Lab 11/03/19 2126  INR 1.1    No results for input(s): DDIMER in the last 72 hours.  Cardiac Enzymes No results for input(s): CKMB, TROPONINI, MYOGLOBIN in the last 168 hours.  Invalid input(s): CK ------------------------------------------------------------------------------------------------------------------    Component Value Date/Time   BNP 82.1 05/14/2018 1016    Micro Results Recent Results (from the past 240 hour(s))  SARS Coronavirus 2 by RT PCR (hospital order, performed in [facility] hospital lab) Nasopharyngeal Nasopharyngeal Swab     Status: None   Collection Time: 11/03/19  7:30 PM   Specimen: Nasopharyngeal Swab  Result Value Ref Range  Status   SARS Coronavirus 2 NEGATIVE NEGATIVE Final    Comment: (NOTE) SARS-CoV-2 target nucleic acids are NOT DETECTED. The SARS-CoV-2 RNA is generally detectable in upper and lower respiratory specimens during the acute phase of infection. The lowest concentration of SARS-CoV-2 viral copies this assay can detect is 250 copies / mL. A negative result does not preclude SARS-CoV-2 infection and should not be used as the sole basis for treatment or other patient management decisions.  A negative result may occur with improper specimen collection / handling, submission of specimen other than nasopharyngeal swab, presence of viral mutation(s) within the areas targeted by this assay, and inadequate number of viral copies (<250 copies / mL). A negative result must be combined with clinical observations, patient history, and epidemiological information. Fact Sheet for Patients:   StrictlyIdeas.no Fact Sheet for Healthcare Providers: BankingDealers.co.za This test is not yet approved or cleared  by the [place] FDA and has been authorized for detection and/or diagnosis of SARS-CoV-2 by FDA under an Emergency Use Authorization (EUA).  This EUA will remain in effect (meaning this test can be used) for the duration of the COVID-19 declaration under Section 564(b)(1) of the Act, 21 U.S.C. section 360bbb-3(b)(1), unless the authorization is terminated or revoked sooner. Performed at [facility], [place] [address], [place], [place] [address]   Surgical PCR screen     Status: None   Collection Time: 11/04/19  8:50 AM   Specimen: Nasal Mucosa; Nasal Swab  Result Value Ref Range Status   MRSA, PCR NEGATIVE NEGATIVE Final   Staphylococcus aureus NEGATIVE NEGATIVE Final    Comment: (NOTE) The Xpert SA Assay (FDA approved for NASAL specimens in patients 94 years of age and older), is one component of a comprehensive surveillance  program. It is not intended to diagnose infection nor to guide or monitor treatment. Performed at [facility], [place] [address] [place], [place] [address]     Radiology Reports CT CHEST WO CONTRAST  Result Date: 11/04/2019 CLINICAL DATA:  New left upper lung opacity on chest x-ray. EXAM: CT CHEST WITHOUT CONTRAST TECHNIQUE: Multidetector CT imaging of the chest was performed following the standard protocol without IV contrast. COMPARISON:  Chest x-ray 11/03/2019. Chest CT 05/14/2018. FINDINGS: Cardiovascular: The heart size is normal. No substantial pericardial effusion. Coronary artery calcification is evident. Atherosclerotic calcification is noted in the wall of the thoracic aorta. Mediastinum/Nodes: Mediastinal lymphadenopathy is progressive in the interval. 15 mm short axis precarinal node on 54/3 was 10 mm when I remeasure in a similar fashion on the prior study. 12 mm short axis subcarinal node  on 62/3 was only about 8 mm short axis when I remeasure on the prior exam. 13 mm short axis prevascular node on 48/3 has increased from 9 mm previously. The esophagus has normal imaging features. There is no axillary lymphadenopathy. Lungs/Pleura: Subpleural reticulation noted bilaterally consistent with underlying chronic interstitial/fibrotic lung disease. Interval development of a 7.1 x 5.9 cm left upper lobe masslike opacity extending into the left hilum. Areas of peripheral consolidative opacity are similar to prior with a new 2.2 cm air cyst in the posterior right upper lobe. No substantial pleural effusion. Upper Abdomen: Unremarkable. Musculoskeletal: No worrisome lytic or sclerotic osseous abnormality. IMPRESSION: 1. Interval development of a 7.1 x 5.9 cm left upper lobe masslike opacity extending into the left hilum. While this could possibly represent pneumonia, imaging features are concerning for neoplasm. 2. Interval development of mediastinal lymphadenopathy. Metastatic  disease not excluded. 3. Underlying chronic interstitial/fibrotic lung disease. 4. Aortic Atherosclerosis (ICD10-I70.0). Electronically Signed   By: Misty Stanley M.D.   On: 11/04/2019 09:52   Pelvis Portable  Result Date: 11/04/2019 CLINICAL DATA:  79 year old female status post right hip replacement. EXAM: PORTABLE PELVIS 1-2 VIEWS COMPARISON:  Intraoperative images earlier today. Portable pelvis radiograph yesterday. FINDINGS: Portable AP view at 1417 hours. New right total hip arthroplasty. Hardware appears intact with normal AP alignment. No unexpected osseous changes identified. Postoperative soft tissue gas in the region. No new osseous abnormality. Visible bowel-gas pattern within normal limits. IMPRESSION: Right total hip arthroplasty with no adverse features. Electronically Signed   By: Genevie Ann M.D.   On: 11/04/2019 14:46   DG Pelvis Portable  Result Date: 11/03/2019 CLINICAL DATA:  Status post fall. EXAM: PORTABLE PELVIS 1-2 VIEWS COMPARISON:  None. FINDINGS: Acute fracture deformity is seen extending through the neck of the proximal right femur. No significant displacement of the proximal and distal fracture sites is seen. There is no evidence of dislocation. No pelvic bone lesions are seen. IMPRESSION: Acute fracture of the proximal right femur. Electronically Signed   By: Virgina Norfolk M.D.   On: 11/03/2019 17:01   DG Chest Portable 1 View  Result Date: 11/03/2019 CLINICAL DATA:  Status post fall. EXAM: PORTABLE CHEST 1 VIEW COMPARISON:  May 13, 2018 FINDINGS: Mild-to-moderate severity diffuse, chronic appearing increased interstitial lung markings are seen. Mild biapical pleural thickening is noted. Mild atelectasis and/or infiltrate is seen within the left lung base and along the periphery of the right upper lobe. A 5.6 cm x 6.2 cm round patchy opacity is seen overlying the mid to upper left lung. This represents a new finding when compared to the prior exam. There is no evidence  of a pleural effusion or pneumothorax. The heart size and mediastinal contours are within normal limits. The visualized skeletal structures are unremarkable. IMPRESSION: 1. Mild atelectasis and/or infiltrate within the left lung base and along the periphery of the right upper lobe. 2. New 5.6 cm x 6.2 cm round patchy opacity overlying the mid to upper left lung. While this may be infectious in etiology, the presence of a large left lung mass cannot be excluded. Correlation with chest CT is recommended. Electronically Signed   By: Virgina Norfolk M.D.   On: 11/03/2019 17:05   DG C-Arm 1-60 Min-No Report  Result Date: 11/04/2019 Fluoroscopy was utilized by the requesting physician.  No radiographic interpretation.   DG HIP OPERATIVE UNILAT W OR W/O PELVIS RIGHT  Result Date: 11/04/2019 CLINICAL DATA:  Right hip replacement EXAM: OPERATIVE RIGHT HIP WITH  PELVIS COMPARISON:  None. FLUOROSCOPY TIME:  Radiation Exposure Index (as provided by the fluoroscopic device): Not available If the device does not provide the exposure index: Fluoroscopy Time:  10 seconds Number of Acquired Images:  5 FINDINGS: Multiple images were obtained during right hip replacement. No acute bony or soft tissue abnormality is noted. IMPRESSION: Status post right hip replacement Electronically Signed   By: Inez Catalina M.D.   On: 11/04/2019 15:08     Time Spent in minutes  30     Desiree Hane M.D on 11/04/2019 at 5:12 PM  To [place] go to [email or web address] - password [facility]

## 2019-11-04 NOTE — Telephone Encounter (Signed)
Routing to MR.  MR, please call pt's son Wille Glaser at [phone] as he is very upset and wants to speak with you about pt.

## 2019-11-04 NOTE — Transfer of Care (Signed)
Immediate Anesthesia Transfer of Care Note  Patient: [name]  Procedure(s) Performed: Procedure(s): TOTAL HIP ARTHROPLASTY ANTERIOR APPROACH (Right)  Patient Location: PACU  Anesthesia Type:Spinal  Level of Consciousness: awake, alert  and oriented  Airway & Oxygen Therapy: Patient Spontanous Breathing  Post-op Assessment: Report given to RN and Post -op Vital signs reviewed and stable  Post vital signs: Reviewed and stable  Last Vitals:  Vitals:   11/04/19 1037 11/04/19 1124  BP: 124/66 (!) 145/72  Pulse: 89 90  Resp: 16 16  Temp: (!) 36.4 C (!) 36.4 C  SpO2: 59% 93%    Complications: No apparent anesthesia complications

## 2019-11-04 NOTE — Consult Note (Signed)
Reason for Consult:  Right femoral neck fracture Referring Physician:  ED Provider  [name] is an 79 y.o. female.  HPI: [name] is a 79 y.o. female presented to the ER with a chief complaint of right hip pain after fall that occurred yesterday.  States that she was at home getting out of her chair at approximately 2 AM when she fell.  States that she laid there for about 12 hours until her son found her just prior to arrival to the ER.  States that she was unable to get up due to pain in her right leg.  She denies any head injury, loss of consciousness or anticoagulant use.  States that she did not fall due to dizziness or lightheadedness but she also does not endorse a mechanical fall.  States a similar thing happened to her 2 years ago, but she never knew why.  She denies any vision changes, vomiting, numbness in arms or legs, saddle anesthesia, chest pain, shortness of breath, loss of bowel or bladder function. She lives at home by herself.  She usually ambulates without assistance, but will sometimes use a cane per her kids request.  Ortho consulted and Dr. Alvan Dame discussed the procedure, risks, benefits and expectations.  She wishes to proceed with a right total hip arthroplasty per Dr. Alvan Dame.  Past Medical History:  Diagnosis Date  . Bronchiectasis    PFTs August 23, 2010 VC 82%   dlco 62 > 129 CORRECTED  . Chronic diarrhea   . Chronic rhinitis    sinus CT ordered August 23, 2010  . Diabetes mellitus ([place])   . Hyperplastic colon polyp   . IBS (irritable bowel syndrome)   . ILD (interstitial lung disease) ([place])   . Stenosis of rectum and anus   . Thyroid disease     Past Surgical History:  Procedure Laterality Date  .  gallbaldder    . CHOLECYSTECTOMY    . DILATION AND CURETTAGE OF UTERUS    . HAND SURGERY  1991   left hand; Baker's Cyst removed  . migraine    . tear duct surgery      Family History  Problem Relation Age of Onset  . Colon polyps Sister   . Heart  disease Sister   . Diabetes Sister   . Heart disease Mother   . Colon cancer Neg Hx     Social History:  reports that she quit smoking about 43 years ago. Her smoking use included cigarettes. She has a 45.00 pack-year smoking history. She has never used smokeless tobacco. She reports that she does not drink alcohol or use drugs.  Allergies:  Allergies  Allergen Reactions  . Penicillins Diarrhea    Has patient had a PCN reaction causing immediate rash, facial/tongue/throat swelling, SOB or lightheadedness with hypotension: Unknown Has patient had a PCN reaction causing severe rash involving mucus membranes or skin necrosis: Unknown Has patient had a PCN reaction that required hospitalization: No  Has patient had a PCN reaction occurring within the last 10 years: No  If all of the above answers are "NO", then may proceed with Cephalosporin use.   . Influenza Vaccines Other (See Comments)    Severe nausea, vomiting and body aches-md told her not to take it anymore  . Clarithromycin     Unknown reaction   . Cortisone Other (See Comments)    "Made her feel like ice water was running through her veins"   . Povidone-Iodine  Unknown reaction     Medications: I have reviewed the patient's current medications.  Results for orders placed or performed during the hospital encounter of 11/03/19 (from the past 48 hour(s))  Comprehensive metabolic panel     Status: Abnormal   Collection Time: 11/03/19  4:13 PM  Result Value Ref Range   Sodium 137 135 - 145 mmol/L   Potassium 3.9 3.5 - 5.1 mmol/L   Chloride 102 98 - 111 mmol/L   CO2 27 22 - 32 mmol/L   Glucose, Bld 142 (H) 70 - 99 mg/dL    Comment: Glucose reference range applies only to samples taken after fasting for at least 8 hours.   BUN 11 8 - 23 mg/dL   Creatinine, Ser 0.61 0.44 - 1.00 mg/dL   Calcium 9.3 8.9 - 10.3 mg/dL   Total Protein 7.8 6.5 - 8.1 g/dL   Albumin 2.9 (L) 3.5 - 5.0 g/dL   AST 22 15 - 41 U/L   ALT 14 0 - 44  U/L   Alkaline Phosphatase 79 38 - 126 U/L   Total Bilirubin 0.4 0.3 - 1.2 mg/dL   GFR calc non Af Amer >60 >60 mL/min   GFR calc Af Amer >60 >60 mL/min   Anion gap 8 5 - 15    Comment: Performed at [facility], [place] [address], [place], [place] [address]  CBC with Differential     Status: Abnormal   Collection Time: 11/03/19  4:13 PM  Result Value Ref Range   WBC 9.0 4.0 - 10.5 K/uL   RBC 3.63 (L) 3.87 - 5.11 MIL/uL   Hemoglobin 10.0 (L) 12.0 - 15.0 g/dL   HCT 32.2 (L) 36.0 - 46.0 %   MCV 88.7 80.0 - 100.0 fL   MCH 27.5 26.0 - 34.0 pg   MCHC 31.1 30.0 - 36.0 g/dL   RDW 14.7 11.5 - 15.5 %   Platelets 313 150 - 400 K/uL   nRBC 0.0 0.0 - 0.2 %   Neutrophils Relative % 82 %   Neutro Abs 7.4 1.7 - 7.7 K/uL   Lymphocytes Relative 14 %   Lymphs Abs 1.2 0.7 - 4.0 K/uL   Monocytes Relative 4 %   Monocytes Absolute 0.4 0.1 - 1.0 K/uL   Eosinophils Relative 0 %   Eosinophils Absolute 0.0 0.0 - 0.5 K/uL   Basophils Relative 0 %   Basophils Absolute 0.0 0.0 - 0.1 K/uL   Immature Granulocytes 0 %   Abs Immature Granulocytes 0.03 0.00 - 0.07 K/uL    Comment: Performed at [facility], [place] [address], [place], [place] [address]  CK     Status: None   Collection Time: 11/03/19  4:13 PM  Result Value Ref Range   Total CK 147 38 - 234 U/L    Comment: Performed at [facility], [place] [address], [place], [place] [address]  SARS Coronavirus 2 by RT PCR (hospital order, performed in [facility] hospital lab) Nasopharyngeal Nasopharyngeal Swab     Status: None   Collection Time: 11/03/19  7:30 PM   Specimen: Nasopharyngeal Swab  Result Value Ref Range   SARS Coronavirus 2 NEGATIVE NEGATIVE    Comment: (NOTE) SARS-CoV-2 target nucleic acids are NOT DETECTED. The SARS-CoV-2 RNA is generally detectable in upper and lower respiratory specimens during the acute phase of infection. The lowest concentration of SARS-CoV-2 viral copies this assay  can detect is 250 copies / mL. A negative result does  not preclude SARS-CoV-2 infection and should not be used as the sole basis for treatment or other patient management decisions.  A negative result may occur with improper specimen collection / handling, submission of specimen other than nasopharyngeal swab, presence of viral mutation(s) within the areas targeted by this assay, and inadequate number of viral copies (<250 copies / mL). A negative result must be combined with clinical observations, patient history, and epidemiological information. Fact Sheet for Patients:   StrictlyIdeas.no Fact Sheet for Healthcare Providers: BankingDealers.co.za This test is not yet approved or cleared  by the [place] FDA and has been authorized for detection and/or diagnosis of SARS-CoV-2 by FDA under an Emergency Use Authorization (EUA).  This EUA will remain in effect (meaning this test can be used) for the duration of the COVID-19 declaration under Section 564(b)(1) of the Act, 21 U.S.C. section 360bbb-3(b)(1), unless the authorization is terminated or revoked sooner. Performed at [facility], [place] [address], [place], [place] [address]   Protime-INR     Status: None   Collection Time: 11/03/19  9:26 PM  Result Value Ref Range   Prothrombin Time 14.2 11.4 - 15.2 seconds   INR 1.1 0.8 - 1.2    Comment: (NOTE) INR goal varies based on device and disease states. Performed at [facility], [place] [address], [place], [place] [address]   Type and screen [place]     Status: None   Collection Time: 11/03/19  9:26 PM  Result Value Ref Range   ABO/RH(D) B POS    Antibody Screen NEG    Sample Expiration      11/06/2019,2359 Performed at [facility], [place] [address], [place], [place] [address]   ABO/Rh     Status: None   Collection Time: 11/03/19  9:26 PM  Result  Value Ref Range   ABO/RH(D)      B POS Performed at [facility], [place] [address], [place], [place] [address]   CBC     Status: Abnormal   Collection Time: 11/04/19  3:13 AM  Result Value Ref Range   WBC 8.1 4.0 - 10.5 K/uL   RBC 3.57 (L) 3.87 - 5.11 MIL/uL   Hemoglobin 9.7 (L) 12.0 - 15.0 g/dL   HCT 32.2 (L) 36.0 - 46.0 %   MCV 90.2 80.0 - 100.0 fL   MCH 27.2 26.0 - 34.0 pg   MCHC 30.1 30.0 - 36.0 g/dL   RDW 15.1 11.5 - 15.5 %   Platelets 247 150 - 400 K/uL   nRBC 0.0 0.0 - 0.2 %    Comment: Performed at [facility], [place] [address] [place],  [address]  Basic metabolic panel     Status: Abnormal   Collection Time: 11/04/19  3:13 AM  Result Value Ref Range   Sodium 134 (L) 135 - 145 mmol/L   Potassium 3.7 3.5 - 5.1 mmol/L   Chloride 102 98 - 111 mmol/L   CO2 25 22 - 32 mmol/L   Glucose, Bld 186 (H) 70 - 99 mg/dL    Comment: Glucose reference range applies only to samples taken after fasting for at least 8 hours.   BUN 11 8 - 23 mg/dL   Creatinine, Ser 0.59 0.44 - 1.00 mg/dL   Calcium 8.5 (L) 8.9 - 10.3 mg/dL   GFR calc non Af Amer >60 >60 mL/min   GFR calc Af Amer >60 >60 mL/min   Anion gap  7 5 - 15    Comment: Performed at [facility], [place] [address] [place], [place] [address]    DG Pelvis Portable  Result Date: 11/03/2019 CLINICAL DATA:  Status post fall. EXAM: PORTABLE PELVIS 1-2 VIEWS COMPARISON:  None. FINDINGS: Acute fracture deformity is seen extending through the neck of the proximal right femur. No significant displacement of the proximal and distal fracture sites is seen. There is no evidence of dislocation. No pelvic bone lesions are seen. IMPRESSION: Acute fracture of the proximal right femur. Electronically Signed   By: Virgina Norfolk M.D.   On: 11/03/2019 17:01   DG Chest Portable 1 View  Result Date: 11/03/2019 CLINICAL DATA:  Status post fall. EXAM: PORTABLE CHEST 1 VIEW COMPARISON:   May 13, 2018 FINDINGS: Mild-to-moderate severity diffuse, chronic appearing increased interstitial lung markings are seen. Mild biapical pleural thickening is noted. Mild atelectasis and/or infiltrate is seen within the left lung base and along the periphery of the right upper lobe. A 5.6 cm x 6.2 cm round patchy opacity is seen overlying the mid to upper left lung. This represents a new finding when compared to the prior exam. There is no evidence of a pleural effusion or pneumothorax. The heart size and mediastinal contours are within normal limits. The visualized skeletal structures are unremarkable. IMPRESSION: 1. Mild atelectasis and/or infiltrate within the left lung base and along the periphery of the right upper lobe. 2. New 5.6 cm x 6.2 cm round patchy opacity overlying the mid to upper left lung. While this may be infectious in etiology, the presence of a large left lung mass cannot be excluded. Correlation with chest CT is recommended. Electronically Signed   By: Virgina Norfolk M.D.   On: 11/03/2019 17:05    Review of Systems  Constitutional: Negative.   HENT: Negative.   Eyes: Negative.   Respiratory: Negative.   Cardiovascular: Negative.   Gastrointestinal: Positive for diarrhea.  Genitourinary: Negative.   Musculoskeletal: Positive for joint pain.  Skin: Negative.   Neurological: Negative.   Endo/Heme/Allergies: Positive for environmental allergies.  Psychiatric/Behavioral: Negative.    Blood pressure 129/65, pulse 87, temperature 98.3 F (36.8 C), resp. rate 18, height 5\' 6"  (1.676 m), weight 70.7 kg, SpO2 91 %. Physical Exam  Constitutional: She is oriented to person, place, and time. She appears well-developed.  HENT:  Head: Normocephalic.  Eyes: Pupils are equal, round, and reactive to light.  Neck: No JVD present. No tracheal deviation present. No thyromegaly present.  Cardiovascular: Normal rate, regular rhythm and intact distal pulses.  Respiratory: Effort normal  and breath sounds normal. No respiratory distress. She has no wheezes.  GI: Soft. There is no abdominal tenderness. There is no guarding.  Musculoskeletal:     Cervical back: Neck supple.     Right hip: Swelling, tenderness and bony tenderness present. Decreased range of motion. Decreased strength.  Lymphadenopathy:    She has no cervical adenopathy.  Neurological: She is alert and oriented to person, place, and time.  Skin: Skin is warm and dry.  Psychiatric: She has a normal mood and affect.    Assessment/Plan: Right femoral neck fracture   Admitted to medicine  NPO now  Plan for OR today for a right THA to repair the femoral neck fx  Urgent order set placed for Dr. Alvan Dame to preform the surgery  Risks, benefits and expectations were discussed with the patient.  Risks including but not limited to the risk of anesthesia, blood clots, nerve  damage, blood vessel damage, failure of the prosthesis, infection and up to and including death.  Patient understand the risks, benefits and expectations and wishes to proceed with surgery.    [name] [facility] 11/04/2019, 8:48 AM

## 2019-11-04 NOTE — Telephone Encounter (Signed)
Son calling to make sure that pt os ok with having surgery and was wanting to see if the biopsy can be ordered while pt is already going in for surgery. Son can be reached at [phone].

## 2019-11-04 NOTE — Anesthesia Procedure Notes (Signed)
Spinal  Patient location during procedure: OR Start time: 11/04/2019 12:02 PM End time: 11/04/2019 12:08 PM Staffing Performed: anesthesiologist  Anesthesiologist: Catalina Gravel, MD Preanesthetic Checklist Completed: patient identified, IV checked, risks and benefits discussed, surgical consent, monitors and equipment checked, pre-op evaluation and timeout performed Spinal Block Patient position: right lateral decubitus Prep: DuraPrep and site prepped and draped Patient monitoring: continuous pulse ox and blood pressure Approach: midline Location: L3-4 Injection technique: single-shot Needle Needle type: Pencan  Needle gauge: 24 G Assessment Sensory level: T8 Additional Notes Functioning IV was confirmed and monitors were applied. Sterile prep and drape, including hand hygiene, mask and sterile gloves were used. The patient was positioned and the spine was prepped. The skin was anesthetized with lidocaine.  Free flow of clear CSF was obtained prior to injecting local anesthetic into the CSF.  The spinal needle aspirated freely following injection.  The needle was carefully withdrawn.  The patient tolerated the procedure well. Consent was obtained prior to procedure with all questions answered and concerns addressed. Risks including but not limited to bleeding, infection, nerve damage, paralysis, failed block, inadequate analgesia, allergic reaction, high spinal, itching and headache were discussed and the patient wished to proceed.   Hoy Morn, MD

## 2019-11-05 ENCOUNTER — Inpatient Hospital Stay (HOSPITAL_COMMUNITY): Payer: Medicare Other

## 2019-11-05 ENCOUNTER — Encounter: Payer: Self-pay | Admitting: *Deleted

## 2019-11-05 DIAGNOSIS — R0600 Dyspnea, unspecified: Secondary | ICD-10-CM

## 2019-11-05 LAB — BASIC METABOLIC PANEL
Anion gap: 7 (ref 5–15)
BUN: 11 mg/dL (ref 8–23)
CO2: 27 mmol/L (ref 22–32)
Calcium: 9.2 mg/dL (ref 8.9–10.3)
Chloride: 101 mmol/L (ref 98–111)
Creatinine, Ser: 0.58 mg/dL (ref 0.44–1.00)
GFR calc Af Amer: >60 mL/min (ref >60)
GFR calc non Af Amer: >60 mL/min (ref >60)
Glucose, Bld: 190 mg/dL — ABNORMAL HIGH (ref 70–99)
Potassium: 4.3 mmol/L (ref 3.5–5.1)
Sodium: 135 mmol/L (ref 135–145)

## 2019-11-05 LAB — ECHOCARDIOGRAM COMPLETE
Height: 66 in
Weight: 2493.84 oz

## 2019-11-05 LAB — CBC
HCT: 31.2 % — ABNORMAL LOW (ref 36.0–46.0)
Hemoglobin: 9.5 g/dL — ABNORMAL LOW (ref 12.0–15.0)
MCH: 27.6 pg (ref 26.0–34.0)
MCHC: 30.4 g/dL (ref 30.0–36.0)
MCV: 90.7 fL (ref 80.0–100.0)
Platelets: 204 10*3/uL (ref 150–400)
RBC: 3.44 MIL/uL — ABNORMAL LOW (ref 3.87–5.11)
RDW: 14.2 % (ref 11.5–15.5)
WBC: 8 10*3/uL (ref 4.0–10.5)
nRBC: 0 % (ref 0.0–0.2)

## 2019-11-05 LAB — GLUCOSE, CAPILLARY
Glucose-Capillary: 134 mg/dL — ABNORMAL HIGH (ref 70–99)
Glucose-Capillary: 172 mg/dL — ABNORMAL HIGH (ref 70–99)
Glucose-Capillary: 151 mg/dL — ABNORMAL HIGH (ref 70–99)
Glucose-Capillary: 107 mg/dL — ABNORMAL HIGH (ref 70–99)

## 2019-11-05 LAB — TSH: TSH: 0.363 u[IU]/mL (ref 0.350–4.500)

## 2019-11-05 MED ORDER — HYDROCODONE-ACETAMINOPHEN 5-325 MG PO TABS: 1.0000 | ORAL_TABLET | Freq: Four times a day (QID) | ORAL | 0 refills | Status: DC | PRN

## 2019-11-05 MED ORDER — FERROUS SULFATE 325 (65 FE) MG PO TABS: 325.0000 mg | ORAL_TABLET | Freq: Three times a day (TID) | ORAL | 0 refills | Status: DC

## 2019-11-05 MED ORDER — CELECOXIB 200 MG PO CAPS: 200.0000 mg | ORAL_CAPSULE | Freq: Two times a day (BID) | ORAL | 0 refills | Status: DC

## 2019-11-05 MED ORDER — DOCUSATE SODIUM 100 MG PO CAPS: 100.0000 mg | ORAL_CAPSULE | Freq: Two times a day (BID) | ORAL | 0 refills | Status: AC

## 2019-11-05 MED ORDER — ASPIRIN 325 MG PO TBEC: 325.0000 mg | DELAYED_RELEASE_TABLET | Freq: Two times a day (BID) | ORAL | 0 refills | Status: DC

## 2019-11-05 MED ORDER — RIVAROXABAN 10 MG PO TABS
10.0000 mg | ORAL_TABLET | Freq: Every day | ORAL | Status: DC
  Administered 2019-11-05 – 2019-11-10 (×6): 10 mg via ORAL
  Filled 2019-11-05 (×6): qty 1

## 2019-11-05 NOTE — Telephone Encounter (Signed)
Patient has called back again requesting urgent call from Dr. [name], please advise.

## 2019-11-05 NOTE — Evaluation (Signed)
Physical Therapy Evaluation Patient Details Name: [name] MRN: [id number] DOB: 08/24/1940 Today's Date: 11/05/2019   History of Present Illness  R hip fx, s/p AA-THA on 11/04/19; PMH of DM, interstitial lung disease (uses 2L O2 prn at baseline)  Clinical Impression  Pt is s/p THA resulting in the deficits listed below (see PT Problem List). Pt ambulated 35' x 2 with seated rest break. She ambulated with RW and 2L O2 (which she uses when walking at baseline), SaO2 100%, HR 121 max walking. Pt stated she does not have 24* assistance available at home. ST-SNF recommended.  Pt will benefit from skilled PT to increase their independence and safety with mobility to allow discharge to the venue listed below.      Follow Up Recommendations SNF;Supervision/Assistance - 24 hour;Supervision for mobility/OOB    Equipment Recommendations  Rolling walker with 5" wheels;3in1 (PT)    Recommendations for Other Services       Precautions / Restrictions Precautions Precautions: Fall Restrictions Weight Bearing Restrictions: No RLE Weight Bearing: Weight bearing as tolerated      Mobility  Bed Mobility               General bed mobility comments: up in recliner  Transfers Overall transfer level: Needs assistance Equipment used: Rolling walker (2 wheeled) Transfers: Sit to/from Stand Sit to Stand: Min guard         General transfer comment: VCs hand placement, sit to stand x 3 trials  Ambulation/Gait Ambulation/Gait assistance: Min guard Gait Distance (Feet): 35 Feet Assistive device: Rolling walker (2 wheeled) Gait Pattern/deviations: Step-to pattern Gait velocity: decr   General Gait Details: 35' x 2 with seated rest break. VCs for sequencing. Ambulated with 2L O2, SaO2 100%, HR 121 max.  Stairs            Wheelchair Mobility    Modified Rankin (Stroke Patients Only)       Balance Overall balance assessment: History of Falls;Needs assistance   Sitting  balance-Leahy Scale: Good       Standing balance-Leahy Scale: Fair                               Pertinent Vitals/Pain Pain Assessment: No/denies pain    Home Living Family/patient expects to be discharged to:: Unsure Living Arrangements: Alone               Additional Comments: pt does not have 24* assistance available from family    Prior Function Level of Independence: Independent with assistive device(s)         Comments: used cane PRN     Hand Dominance        Extremity/Trunk Assessment   Upper Extremity Assessment Upper Extremity Assessment: Defer to OT evaluation    Lower Extremity Assessment Lower Extremity Assessment: RLE deficits/detail RLE Deficits / Details: knee ext at least 3/5 RLE Sensation: WNL RLE Coordination: WNL    Cervical / Trunk Assessment Cervical / Trunk Assessment: Normal  Communication   Communication: No difficulties  Cognition Arousal/Alertness: Awake/alert Behavior During Therapy: WFL for tasks assessed/performed Overall Cognitive Status: Within Functional Limits for tasks assessed                                 General Comments: seems to lack some insight into her deficits      General Comments  Exercises     Assessment/Plan    PT Assessment Patient needs continued PT services  PT Problem List Decreased mobility;Decreased activity tolerance;Pain;Decreased knowledge of use of DME       PT Treatment Interventions Gait training;Functional mobility training;Therapeutic exercise;Therapeutic activities;Patient/family education    PT Goals (Current goals can be found in the Care Plan section)  Acute Rehab PT Goals Patient Stated Goal: DC home PT Goal Formulation: With patient Time For Goal Achievement: 11/19/19 Potential to Achieve Goals: Good    Frequency Min 5X/week   Barriers to discharge        Co-evaluation               AM-PAC PT "6 Clicks" Mobility  Outcome  Measure Help needed turning from your back to your side while in a flat bed without using bedrails?: A Little Help needed moving from lying on your back to sitting on the side of a flat bed without using bedrails?: A Little Help needed moving to and from a bed to a chair (including a wheelchair)?: A Little Help needed standing up from a chair using your arms (e.g., wheelchair or bedside chair)?: A Little Help needed to walk in hospital room?: A Little Help needed climbing 3-5 steps with a railing? : A Lot 6 Click Score: 17    End of Session Equipment Utilized During Treatment: Gait belt Activity Tolerance: Patient tolerated treatment well Patient left: in chair;with call bell/phone within reach;with family/visitor present Nurse Communication: Mobility status PT Visit Diagnosis: Difficulty in walking, not elsewhere classified (R26.2);History of falling (Z91.81)    Time: 3009-2330 PT Time Calculation (min) (ACUTE ONLY): 35 min   Charges:   PT Evaluation $PT Eval Low Complexity: 1 Low PT Treatments $Gait Training: 8-22 mins       Blondell Reveal Kistler PT 11/05/2019  Acute Rehabilitation Services Pager [phone] Office [phone]

## 2019-11-05 NOTE — Progress Notes (Signed)
[facility]  PROGRESS NOTE  [name] ZHY:[id number] DOB: 10/12/1940 DOA: 11/03/2019 PCP: [name], MD Admit date - 11/03/2019   Admitting Physician Harvie Bridge, DO  Outpatient Primary MD for the patient is [name], MD  LOS - 2 Brief Narrative   [name] is a 79 y.o. year old female with medical history significant for hypothyroidism, chronic interstitial lung disease who presented on 11/03/2019 with right hip pain after falling while getting out of her chair and landing on her right hip with immediate onset of pain, shortening and inability to bear weight.  Denies any preceding symptoms of chest pain, palpitation, shortness of breath, vision changes.    In the ED, patient was afebrile hemodynamically stable, Covid test negative, lab work notable for hemoglobin of 10, CK 147.  Chest x-ray showed mild atelectasis with infiltrate within the left lung base and periphery right upper lobe, new mild patchy opacity overlying the mid to upper left lung.  Pelvic x-ray showed acute fracture of proximal right femur.  Patient was admitted for right femur neck fracture under TR[facility] management.  Patient underwent right hip replacement by Dr. Alvan Dame on 5/26.  Subjective   Reports was able to ambulate some overnight with assistance from nursing.  Reports pain in hip well controlled.  A & P   Right hip displaced femoral neck fracture after ground-level fall.  Status post right hip replacement by Dr. Alvan Dame on 5/26 -IV Dilaudid as needed severe breakthrough pain -Norco/Vicodin every 4 hours as needed moderate pain -Hydrocodone/acetaminophen every 4 hours as needed severe pain-Robaxin as needed muscle spasms -Antiemetics as needed -Colace twice daily -DVT prophylaxis with aspirin 81 mg twice daily x4 weeks, weightbearing as tolerated per Ortho recommendations -PT/OT recommends continuing skilled PT at SNF to increase her independence and safety with mobility to eventually allow  discharge to home  Unwitnessed fall from chair.  Patient was down for at least 12 hours per her report.  CK unremarkable.  Status post right hip repair.  Denied any preceding symptoms reports prior history of similar fall. -PT/OT eval commend SNF --Check TSH  Hypothyroidism, stable TSH within normal limits -Continue Synthroid  Left lung base mass measuring 7.1 x 5.9 cm with mediastinal adenopathy CXR on admission showed concern for potential left upper lobe mass.  CT chest shows interval development of left upper lobe opacity extending into the left hilum concerning for either pneumonia versus neoplasm with interval development of mediastinal lymphadenopathy with background of chronic interstitial lung disease.  TTE shows preserved EF with grade 1 diastolic dysfunction and moderately elevated Pulmonary artery systolic pressure -Followed by Dr. [name] (pulmonology) outpatient, have discussed biopsy at that time patient wanted to defer due to patient's husband's recent death - Patient and family wanting biopsy after discussion today  -Maintained normal oxygen saturation with ambulation while working with PT   Chronic interstitial lung disease. Uses 2 L [place] O2 intermittently at home per family. -continue O2 here -closely monitor  Type 2 diabetes. CBGs  140s-180s -Holding home pioglitazone --check A1C, change to diabetic diet -CBG with sliding scale as needed.   Hyperlipidemia, stable -Continue pravastatin   Family Communication  :  Updated daughter and son at bedside  Code Status :  FULL  Disposition Plan  :  Patient is from home. Anticipated d/c date: 24-48 hours. Barriers to d/c or necessity for inpatient status: PT/OT  Recommend SNF, pain control, will need to determine if potential biopsy can happen as inpatient as patient/family now  amenable  Consults  :  ortho  Procedures  :  Total right hip arthroplasty by Dr. Alvan Dame on 5/26  DVT Prophylaxis  :  Defer to ortho who  recommended aspirin, patient states can't take so on xarelto   Lab Results  Component Value Date   PLT 204 11/05/2019    Diet :  Diet Order            Diet Carb Modified Fluid consistency: Thin; Room service appropriate? Yes  Diet effective now               Inpatient Medications Scheduled Meds:  vitamin C  1,000 mg Oral QHS   celecoxib  200 mg Oral BID   Chlorhexidine Gluconate Cloth  6 each Topical Daily   docusate sodium  100 mg Oral BID   feeding supplement (ENSURE ENLIVE)  237 mL Oral BID BM   ferrous sulfate  325 mg Oral TID PC   insulin aspart  0-9 Units Subcutaneous TID WC   levothyroxine  88 mcg Oral QAC breakfast   multivitamin with minerals  1 tablet Oral Daily   pravastatin  40 mg Oral Daily   rivaroxaban  10 mg Oral Daily   Continuous Infusions:  sodium chloride     methocarbamol (ROBAXIN) IV     PRN Meds:.sodium chloride, acetaminophen, benzonatate, HYDROcodone-acetaminophen, HYDROcodone-acetaminophen, HYDROmorphone (DILAUDID) injection, menthol-cetylpyridinium **OR** phenol, methocarbamol **OR** methocarbamol (ROBAXIN) IV, metoCLOPramide **OR** metoCLOPramide (REGLAN) injection, ondansetron **OR** ondansetron (ZOFRAN) IV, polyvinyl alcohol  Antibiotics  :   Anti-infectives (From admission, onward)   Start     Dose/Rate Route Frequency Ordered Stop   11/04/19 1800  ceFAZolin (ANCEF) IVPB 2g/100 mL premix     2 g 200 mL/hr over 30 Minutes Intravenous Every 6 hours 11/04/19 1515 11/05/19 0059   11/04/19 1115  ceFAZolin (ANCEF) IVPB 2g/100 mL premix     2 g 200 mL/hr over 30 Minutes Intravenous On call to O.R. 11/04/19 1106 11/04/19 1158       Objective   Vitals:   11/05/19 0142 11/05/19 0436 11/05/19 0945 11/05/19 1340  BP: (!) 102/59 105/88 113/67 107/69  Pulse: 77 79 (!) 109 (!) 108  Resp: 16 16 17 17   Temp: 98.3 F (36.8 C) 98.2 F (36.8 C) 97.7 F (36.5 C) (!) 97.5 F (36.4 C)  TempSrc:    Oral  SpO2: 100% 100% 92% 100%    Weight:      Height:        SpO2: 100 % O2 Flow Rate (L/min): 2 L/min  Wt Readings from Last 3 Encounters:  11/04/19 70.7 kg  08/14/19 72.6 kg  07/31/18 85.1 kg     Intake/Output Summary (Last 24 hours) at 11/05/2019 1716 Last data filed at 11/05/2019 1513 Gross per 24 hour  Intake 710 ml  Output 1900 ml  Net -1190 ml    Physical Exam:     Resting in bed comfortably No new F.N deficits,  [place].AT, Regular rate and rhythm, no peripheral edema Abdomen soft, nondistended, nontender Normal respiratory effort on 2 L [place], no wheezing or rhonchi Right hip with postoperative dressing in place (clear, dry, intact)     I have personally reviewed the following:   Data Reviewed:  CBC Recent Labs  Lab 11/03/19 1613 11/04/19 0313 11/05/19 0352  WBC 9.0 8.1 8.0  HGB 10.0* 9.7* 9.5*  HCT 32.2* 32.2* 31.2*  PLT 313 247 204  MCV 88.7 90.2 90.7  MCH 27.5 27.2 27.6  MCHC 31.1 30.1  30.4  RDW 14.7 15.1 14.2  LYMPHSABS 1.2  --   --   MONOABS 0.4  --   --   EOSABS 0.0  --   --   BASOSABS 0.0  --   --     Chemistries  Recent Labs  Lab 11/03/19 1613 11/04/19 0313 11/05/19 0352  NA 137 134* 135  K 3.9 3.7 4.3  CL 102 102 101  CO2 27 25 27   GLUCOSE 142* 186* 190*  BUN 11 11 11   CREATININE 0.61 0.59 0.58  CALCIUM 9.3 8.5* 9.2  AST 22  --   --   ALT 14  --   --   ALKPHOS 79  --   --   BILITOT 0.4  --   --    ------------------------------------------------------------------------------------------------------------------ No results for input(s): CHOL, HDL, LDLCALC, TRIG, CHOLHDL, LDLDIRECT in the last 72 hours.  Lab Results  Component Value Date   HGBA1C 6.2 (H) 11/04/2019   ------------------------------------------------------------------------------------------------------------------ Recent Labs    11/05/19 0352  TSH 0.363   ------------------------------------------------------------------------------------------------------------------ No results for  input(s): VITAMINB12, FOLATE, FERRITIN, TIBC, IRON, RETICCTPCT in the last 72 hours.  Coagulation profile Recent Labs  Lab 11/03/19 2126  INR 1.1    No results for input(s): DDIMER in the last 72 hours.  Cardiac Enzymes No results for input(s): CKMB, TROPONINI, MYOGLOBIN in the last 168 hours.  Invalid input(s): CK ------------------------------------------------------------------------------------------------------------------    Component Value Date/Time   BNP 82.1 05/14/2018 1016    Micro Results Recent Results (from the past 240 hour(s))  SARS Coronavirus 2 by RT PCR (hospital order, performed in [facility] hospital lab) Nasopharyngeal Nasopharyngeal Swab     Status: None   Collection Time: 11/03/19  7:30 PM   Specimen: Nasopharyngeal Swab  Result Value Ref Range Status   SARS Coronavirus 2 NEGATIVE NEGATIVE Final    Comment: (NOTE) SARS-CoV-2 target nucleic acids are NOT DETECTED. The SARS-CoV-2 RNA is generally detectable in upper and lower respiratory specimens during the acute phase of infection. The lowest concentration of SARS-CoV-2 viral copies this assay can detect is 250 copies / mL. A negative result does not preclude SARS-CoV-2 infection and should not be used as the sole basis for treatment or other patient management decisions.  A negative result may occur with improper specimen collection / handling, submission of specimen other than nasopharyngeal swab, presence of viral mutation(s) within the areas targeted by this assay, and inadequate number of viral copies (<250 copies / mL). A negative result must be combined with clinical observations, patient history, and epidemiological information. Fact Sheet for Patients:   StrictlyIdeas.no Fact Sheet for Healthcare Providers: BankingDealers.co.za This test is not yet approved or cleared  by the [place] FDA and has been authorized for detection and/or  diagnosis of SARS-CoV-2 by FDA under an Emergency Use Authorization (EUA).  This EUA will remain in effect (meaning this test can be used) for the duration of the COVID-19 declaration under Section 564(b)(1) of the Act, 21 U.S.C. section 360bbb-3(b)(1), unless the authorization is terminated or revoked sooner. Performed at [facility], [place] [address], [place], [place] [address]   Surgical PCR screen     Status: None   Collection Time: 11/04/19  8:50 AM   Specimen: Nasal Mucosa; Nasal Swab  Result Value Ref Range Status   MRSA, PCR NEGATIVE NEGATIVE Final   Staphylococcus aureus NEGATIVE NEGATIVE Final    Comment: (NOTE) The Xpert SA Assay (FDA approved for NASAL specimens in patients 22  years of age and older), is one component of a comprehensive surveillance program. It is not intended to diagnose infection nor to guide or monitor treatment. Performed at [facility], [place] [address] [place], [place] [address]     Radiology Reports CT CHEST WO CONTRAST  Result Date: 11/04/2019 CLINICAL DATA:  New left upper lung opacity on chest x-ray. EXAM: CT CHEST WITHOUT CONTRAST TECHNIQUE: Multidetector CT imaging of the chest was performed following the standard protocol without IV contrast. COMPARISON:  Chest x-ray 11/03/2019. Chest CT 05/14/2018. FINDINGS: Cardiovascular: The heart size is normal. No substantial pericardial effusion. Coronary artery calcification is evident. Atherosclerotic calcification is noted in the wall of the thoracic aorta. Mediastinum/Nodes: Mediastinal lymphadenopathy is progressive in the interval. 15 mm short axis precarinal node on 54/3 was 10 mm when I remeasure in a similar fashion on the prior study. 12 mm short axis subcarinal node on 62/3 was only about 8 mm short axis when I remeasure on the prior exam. 13 mm short axis prevascular node on 48/3 has increased from 9 mm previously. The esophagus has normal imaging features.  There is no axillary lymphadenopathy. Lungs/Pleura: Subpleural reticulation noted bilaterally consistent with underlying chronic interstitial/fibrotic lung disease. Interval development of a 7.1 x 5.9 cm left upper lobe masslike opacity extending into the left hilum. Areas of peripheral consolidative opacity are similar to prior with a new 2.2 cm air cyst in the posterior right upper lobe. No substantial pleural effusion. Upper Abdomen: Unremarkable. Musculoskeletal: No worrisome lytic or sclerotic osseous abnormality. IMPRESSION: 1. Interval development of a 7.1 x 5.9 cm left upper lobe masslike opacity extending into the left hilum. While this could possibly represent pneumonia, imaging features are concerning for neoplasm. 2. Interval development of mediastinal lymphadenopathy. Metastatic disease not excluded. 3. Underlying chronic interstitial/fibrotic lung disease. 4. Aortic Atherosclerosis (ICD10-I70.0). Electronically Signed   By: Misty Stanley M.D.   On: 11/04/2019 09:52   Pelvis Portable  Result Date: 11/04/2019 CLINICAL DATA:  79 year old female status post right hip replacement. EXAM: PORTABLE PELVIS 1-2 VIEWS COMPARISON:  Intraoperative images earlier today. Portable pelvis radiograph yesterday. FINDINGS: Portable AP view at 1417 hours. New right total hip arthroplasty. Hardware appears intact with normal AP alignment. No unexpected osseous changes identified. Postoperative soft tissue gas in the region. No new osseous abnormality. Visible bowel-gas pattern within normal limits. IMPRESSION: Right total hip arthroplasty with no adverse features. Electronically Signed   By: Genevie Ann M.D.   On: 11/04/2019 14:46   DG Pelvis Portable  Result Date: 11/03/2019 CLINICAL DATA:  Status post fall. EXAM: PORTABLE PELVIS 1-2 VIEWS COMPARISON:  None. FINDINGS: Acute fracture deformity is seen extending through the neck of the proximal right femur. No significant displacement of the proximal and distal fracture  sites is seen. There is no evidence of dislocation. No pelvic bone lesions are seen. IMPRESSION: Acute fracture of the proximal right femur. Electronically Signed   By: Virgina Norfolk M.D.   On: 11/03/2019 17:01   DG Chest Portable 1 View  Result Date: 11/03/2019 CLINICAL DATA:  Status post fall. EXAM: PORTABLE CHEST 1 VIEW COMPARISON:  May 13, 2018 FINDINGS: Mild-to-moderate severity diffuse, chronic appearing increased interstitial lung markings are seen. Mild biapical pleural thickening is noted. Mild atelectasis and/or infiltrate is seen within the left lung base and along the periphery of the right upper lobe. A 5.6 cm x 6.2 cm round patchy opacity is seen overlying the mid to upper left lung. This represents a new  finding when compared to the prior exam. There is no evidence of a pleural effusion or pneumothorax. The heart size and mediastinal contours are within normal limits. The visualized skeletal structures are unremarkable. IMPRESSION: 1. Mild atelectasis and/or infiltrate within the left lung base and along the periphery of the right upper lobe. 2. New 5.6 cm x 6.2 cm round patchy opacity overlying the mid to upper left lung. While this may be infectious in etiology, the presence of a large left lung mass cannot be excluded. Correlation with chest CT is recommended. Electronically Signed   By: Virgina Norfolk M.D.   On: 11/03/2019 17:05   DG C-Arm 1-60 Min-No Report  Result Date: 11/04/2019 Fluoroscopy was utilized by the requesting physician.  No radiographic interpretation.   ECHOCARDIOGRAM COMPLETE  Result Date: 11/05/2019    ECHOCARDIOGRAM REPORT   Patient Name:   [name] Date of Exam: 11/05/2019 Medical Rec #:  [id number]      Height:       66.0 in Accession #:    [id number]     Weight:       155.9 lb Date of Birth:  January 11, 1941       BSA:          1.799 m Patient Age:    65 years       BP:           107/69 mmHg Patient Gender: F              HR:           98 bpm. Exam  Location:  Inpatient Procedure: 2D Echo, Color Doppler and Cardiac Doppler Indications:    R06.9 DOE  History:        Patient has no prior history of Echocardiogram examinations.                 Risk Factors:Hypertension, Diabetes and Dyslipidemia.  Sonographer:    Raquel Sarna Senior RDCS Referring Phys: [id number] ALEXIS HUGELMEYER  Sonographer Comments: Scanned supine, 1 day post hip-surgery IMPRESSIONS  1. Left ventricular ejection fraction, by estimation, is 65 to 70%. The left ventricle has normal function. The left ventricle has no regional wall motion abnormalities. Left ventricular diastolic parameters are consistent with Grade I diastolic dysfunction (impaired relaxation).  2. Right ventricular systolic function is normal. The right ventricular size is normal. There is moderately elevated pulmonary artery systolic pressure. The estimated right ventricular systolic pressure is 81.4 mmHg.  3. The mitral valve is grossly normal. Trivial mitral valve regurgitation.  4. The aortic valve is tricuspid. Aortic valve regurgitation is not visualized.  5. The inferior vena cava is normal in size with greater than 50% respiratory variability, suggesting right atrial pressure of 3 mmHg. FINDINGS  Left Ventricle: Left ventricular ejection fraction, by estimation, is 65 to 70%. The left ventricle has normal function. The left ventricle has no regional wall motion abnormalities. The left ventricular internal cavity size was normal in size. There is  no left ventricular hypertrophy. Left ventricular diastolic parameters are consistent with Grade I diastolic dysfunction (impaired relaxation). Indeterminate filling pressures. Right Ventricle: The right ventricular size is normal. No increase in right ventricular wall thickness. Right ventricular systolic function is normal. There is moderately elevated pulmonary artery systolic pressure. The tricuspid regurgitant velocity is 3.37 m/s, and with an assumed right atrial pressure of 3  mmHg, the estimated right ventricular systolic pressure is 48.1 mmHg. Left Atrium: Left atrial size was normal in size. Right Atrium:  Right atrial size was normal in size. Pericardium: There is no evidence of pericardial effusion. Mitral Valve: The mitral valve is grossly normal. Trivial mitral valve regurgitation. Tricuspid Valve: The tricuspid valve is grossly normal. Tricuspid valve regurgitation is trivial. Aortic Valve: The aortic valve is tricuspid. Aortic valve regurgitation is not visualized. Pulmonic Valve: The pulmonic valve was normal in structure. Pulmonic valve regurgitation is not visualized. Aorta: The aortic root and ascending aorta are structurally normal, with no evidence of dilitation. Venous: The inferior vena cava is normal in size with greater than 50% respiratory variability, suggesting right atrial pressure of 3 mmHg. IAS/Shunts: No atrial level shunt detected by color flow Doppler.  LEFT VENTRICLE PLAX 2D LVIDd:         3.70 cm  Diastology LVIDs:         2.20 cm  LV e' lateral: 10.20 cm/s LV PW:         0.90 cm LV IVS:        0.90 cm LVOT diam:     1.80 cm LV SV:         35 LV SV Index:   20 LVOT Area:     2.54 cm  RIGHT VENTRICLE RV S prime:     18.20 cm/s TAPSE (M-mode): 2.2 cm LEFT ATRIUM             Index       RIGHT ATRIUM           Index LA diam:        2.00 cm 1.11 cm/m  RA Area:     14.20 cm LA Vol (A2C):   39.8 ml 22.13 ml/m RA Volume:   39.10 ml  21.74 ml/m LA Vol (A4C):   46.1 ml 25.63 ml/m LA Biplane Vol: 47.7 ml 26.52 ml/m  AORTIC VALVE LVOT Vmax:   81.60 cm/s LVOT Vmean:  60.500 cm/s LVOT VTI:    0.138 m  AORTA Ao Root diam: 3.40 cm Ao Asc diam:  3.20 cm TRICUSPID VALVE TR Peak grad:   45.4 mmHg TR Vmax:        337.00 cm/s  SHUNTS Systemic VTI:  0.14 m Systemic Diam: 1.80 cm Lyman Bishop MD Electronically signed by Lyman Bishop MD Signature Date/Time: 11/05/2019/3:48:12 PM    Final    DG HIP OPERATIVE UNILAT W OR W/O PELVIS RIGHT  Result Date: 11/04/2019 CLINICAL  DATA:  Right hip replacement EXAM: OPERATIVE RIGHT HIP WITH PELVIS COMPARISON:  None. FLUOROSCOPY TIME:  Radiation Exposure Index (as provided by the fluoroscopic device): Not available If the device does not provide the exposure index: Fluoroscopy Time:  10 seconds Number of Acquired Images:  5 FINDINGS: Multiple images were obtained during right hip replacement. No acute bony or soft tissue abnormality is noted. IMPRESSION: Status post right hip replacement Electronically Signed   By: Inez Catalina M.D.   On: 11/04/2019 15:08     Time Spent in minutes  30     Desiree Hane M.D on 11/05/2019 at 5:16 PM  To [place] go to [email or web address] - password [facility]

## 2019-11-05 NOTE — Telephone Encounter (Signed)
Patient's daughter called back during downtime of Epic with additional concerns.  Dr. [name] has not family's call.  Please call at [phone].  Patient's family is upset.

## 2019-11-05 NOTE — Evaluation (Signed)
Occupational Therapy Evaluation Patient Details Name: [name] MRN: [id number] DOB: 01-Dec-1940 Today's Date: 11/05/2019    History of Present Illness R hip fx, s/p AA-THA on 11/04/19; PMH of DM, interstitial lung disease (uses 2L O2 prn at baseline)   Clinical Impression   Patient with functional deficits listed below impacting safety and independence with self care. Patient requires max cues for sequencing body mechanics during functional transfers and ambulation with walker as patient is tangential/distractible requiring redirection. Patient min A for transfer to recliner for safety, mod A for LB dressing to don R sock. Recommend SNF to maximize patient safety, further transfer and ADL training prior to returning home alone as patient does not have 24/7 assistance at discharge.    Follow Up Recommendations  SNF    Equipment Recommendations  Other (comment)(defer to next venue)       Precautions / Restrictions Precautions Precautions: Fall Restrictions Weight Bearing Restrictions: Yes RLE Weight Bearing: Weight bearing as tolerated      Mobility Bed Mobility Overal bed mobility: Needs Assistance Bed Mobility: Supine to Sit     Supine to sit: Min guard;HOB elevated     General bed mobility comments: cues for sequencing  Transfers Overall transfer level: Needs assistance Equipment used: Rolling walker (2 wheeled) Transfers: Sit to/from Stand Sit to Stand: Min assist         General transfer comment: max cues to sequence body mechanics    Balance Overall balance assessment: History of Falls;Needs assistance Sitting-balance support: No upper extremity supported;Feet supported Sitting balance-Leahy Scale: Good     Standing balance support: Bilateral upper extremity supported;During functional activity Standing balance-Leahy Scale: Poor Standing balance comment: reliant on B UE support                           ADL either performed or assessed with  clinical judgement   ADL Overall ADL's : Needs assistance/impaired     Grooming: Set up;Sitting   Upper Body Bathing: Set up;Sitting   Lower Body Bathing: Moderate assistance;Sitting/lateral leans;Sit to/from stand   Upper Body Dressing : Set up;Sitting   Lower Body Dressing: Moderate assistance;Sitting/lateral leans;Sit to/from stand Lower Body Dressing Details (indicate cue type and reason): pt able to don L sock, unable to don R Toilet Transfer: Minimal assistance;Cueing for safety;Cueing for sequencing;BSC;RW;Ambulation Toilet Transfer Details (indicate cue type and reason): simulated to recliner, max cues to redirect to body mechanics  as patient is distractible Toileting- Clothing Manipulation and Hygiene: Minimal assistance;Moderate assistance;Sitting/lateral lean;Sit to/from stand       Functional mobility during ADLs: Minimal assistance;Cueing for safety;Cueing for sequencing;Rolling walker                    Pertinent Vitals/Pain Pain Assessment: Faces Faces Pain Scale: Hurts a little bit Pain Location: R hip Pain Descriptors / Indicators: Aching Pain Intervention(s): Monitored during session     Hand Dominance Right   Extremity/Trunk Assessment Upper Extremity Assessment Upper Extremity Assessment: Generalized weakness   Lower Extremity Assessment Lower Extremity Assessment: Defer to PT evaluation RLE Deficits / Details: knee ext at least 3/5 RLE Sensation: WNL RLE Coordination: WNL   Cervical / Trunk Assessment Cervical / Trunk Assessment: Normal   Communication Communication Communication: No difficulties   Cognition Arousal/Alertness: Awake/alert Behavior During Therapy: WFL for tasks assessed/performed Overall Cognitive Status: Within Functional Limits for tasks assessed  General Comments: patient lacking insight into her deficits, patient is very tangential and distractible often getting off  topic requiring cues to redirect   General Comments  pt 94% on room air            [place] expects to be discharged to:: Private residence Living Arrangements: Alone Available Help at Discharge: Family;Available PRN/intermittently Type of Home: House Home Access: Stairs to enter [name] of Steps: 1 step patio-1 step sun room- 1 step into bathroom   Home Layout: One level     Bathroom Shower/Tub: Teacher, early years/pre: Handicapped height Bathroom Accessibility: Yes How Accessible: Accessible via walker Home Equipment: [name];Shower seat;Cane - single point   Additional Comments: pt does not have 24* assistance available from family      Prior Functioning/Environment Level of Independence: Independent with assistive device(s)        Comments: used cane/walker PRN        OT Problem List: Decreased activity tolerance;Impaired balance (sitting and/or standing);Decreased strength;Decreased safety awareness;Decreased knowledge of use of DME or AE;Pain      OT Treatment/Interventions: Self-care/ADL training;Therapeutic exercise;DME and/or AE instruction;Therapeutic activities;Patient/family education;Balance training;Cognitive remediation/compensation    OT Goals(Current goals can be found in the care plan section) Acute Rehab OT Goals Patient Stated Goal: DC home OT Goal Formulation: With patient Time For Goal Achievement: 11/19/19 Potential to Achieve Goals: Good  OT Frequency: Min 2X/week    AM-PAC OT "6 Clicks" Daily Activity     Outcome Measure Help from another person eating meals?: None Help from another person taking care of personal grooming?: A Little Help from another person toileting, which includes using toliet, bedpan, or urinal?: A Lot Help from another person bathing (including washing, rinsing, drying)?: A Lot Help from another person to put on and taking off regular upper body clothing?: A  Little Help from another person to put on and taking off regular lower body clothing?: A Lot 6 Click Score: 16   End of Session Equipment Utilized During Treatment: Rolling walker Nurse Communication: Mobility status  Activity Tolerance: Patient tolerated treatment well Patient left: in chair;with call bell/phone within reach;with chair alarm set;with family/visitor present  OT Visit Diagnosis: Other abnormalities of gait and mobility (R26.89);History of falling (Z91.81);Pain Pain - Right/Left: Right Pain - part of body: Hip                Time: 2010-0712 OT Time Calculation (min): 38 min Charges:  OT General Charges $OT Visit: 1 Visit OT Evaluation $OT Eval Moderate Complexity: 1 Mod OT Treatments $Self Care/Home Management : 23-37 mins  Delbert Phenix OT Pager: [phone]  Rosemary Holms 11/05/2019, 2:44 PM

## 2019-11-05 NOTE — Progress Notes (Signed)
Echocardiogram 2D Echocardiogram has been performed.  Oneal Deputy Senior 11/05/2019, 2:52 PM

## 2019-11-05 NOTE — Progress Notes (Signed)
   Subjective: 1 Day Post-Op Procedure(s) (LRB): TOTAL HIP ARTHROPLASTY ANTERIOR APPROACH (Right) Patient reports pain as mild.   Patient seen in rounds by Dr. Alvan Dame. Patient is well, and has had no acute complaints or problems other than discomfort in the right hip. No acute events overnight. Foley catheter removed.  We will start therapy today.   Objective: Vital signs in last 24 hours: Temp:  [97.4 F (36.3 C)-98.3 F (36.8 C)] 98.2 F (36.8 C) (05/27 0436) Pulse Rate:  [77-90] 79 (05/27 0436) Resp:  [12-29] 16 (05/27 0436) BP: (102-145)/(55-88) 105/88 (05/27 0436) SpO2:  [93 %-100 %] 100 % (05/27 0436) Weight:  [70.7 kg] 70.7 kg (05/26 1124)  Intake/Output from previous day:  Intake/Output Summary (Last 24 hours) at 11/05/2019 0945 Last data filed at 11/05/2019 4680 Gross per 24 hour  Intake 1720 ml  Output 2700 ml  Net -980 ml     Intake/Output this shift: Total I/O In: 120 [P.O.:120] Out: -   Labs: Recent Labs    11/03/19 1613 11/04/19 0313 11/05/19 0352  HGB 10.0* 9.7* 9.5*   Recent Labs    11/04/19 0313 11/05/19 0352  WBC 8.1 8.0  RBC 3.57* 3.44*  HCT 32.2* 31.2*  PLT 247 204   Recent Labs    11/04/19 0313 11/05/19 0352  NA 134* 135  K 3.7 4.3  CL 102 101  CO2 25 27  BUN 11 11  CREATININE 0.59 0.58  GLUCOSE 186* 190*  CALCIUM 8.5* 9.2   Recent Labs    11/03/19 2126  INR 1.1    Exam: General - Patient is Alert and Oriented Extremity - Neurologically intact Sensation intact distally Intact pulses distally Dorsiflexion/Plantar flexion intact Dressing - dressing C/D/I Motor Function - intact, moving foot and toes well on exam.   Past Medical History:  Diagnosis Date  . Bronchiectasis    PFTs August 23, 2010 VC 82%   dlco 62 > 129 CORRECTED  . Chronic diarrhea   . Chronic rhinitis    sinus CT ordered August 23, 2010  . Diabetes mellitus ([place])   . Hyperplastic colon polyp   . IBS (irritable bowel syndrome)   . ILD (interstitial  lung disease) ([place])   . Stenosis of rectum and anus   . Thyroid disease     Assessment/Plan: 1 Day Post-Op Procedure(s) (LRB): TOTAL HIP ARTHROPLASTY ANTERIOR APPROACH (Right) Active Problems:   INTERSTITIAL LUNG DISEASE   Mass of left lung   Type 2 diabetes mellitus with hyperlipidemia (H[facility])   Fracture of femoral neck, right ([facility])   Unwitnessed fall   Mediastinal adenopathy   Hypothyroidism  Estimated body mass index is 25.16 kg/m as calculated from the following:   Height as of this encounter: 5\' 6"  (1.676 m).   Weight as of this encounter: 70.7 kg. Advance diet Up with therapy  DVT Prophylaxis - Aspirin 81 mg BID x4 weeks Weight bearing as tolerated.  Patient to work with therapy today. Discharge disposition based on progression with therapy. When she is meeting her goals with therapy and medically stable she will be ready for discharge from our standpoint.   We appreciate the assistance of the medical team in caring for this patient.   Griffith Citron, PA-C Orthopedic Surgery [phone] 11/05/2019, 9:45 AM

## 2019-11-05 NOTE — Discharge Instructions (Signed)

## 2019-11-06 ENCOUNTER — Other Ambulatory Visit: Payer: Self-pay | Admitting: Internal Medicine

## 2019-11-06 ENCOUNTER — Telehealth: Payer: Self-pay | Admitting: Internal Medicine

## 2019-11-06 ENCOUNTER — Ambulatory Visit (INDEPENDENT_AMBULATORY_CARE_PROVIDER_SITE_OTHER): Payer: Medicare Other | Admitting: Internal Medicine

## 2019-11-06 DIAGNOSIS — J849 Interstitial pulmonary disease, unspecified: Secondary | ICD-10-CM | POA: Diagnosis not present

## 2019-11-06 DIAGNOSIS — Z7185 Encounter for immunization safety counseling: Secondary | ICD-10-CM

## 2019-11-06 DIAGNOSIS — Z7189 Other specified counseling: Secondary | ICD-10-CM

## 2019-11-06 DIAGNOSIS — J8410 Pulmonary fibrosis, unspecified: Secondary | ICD-10-CM

## 2019-11-06 DIAGNOSIS — R590 Localized enlarged lymph nodes: Secondary | ICD-10-CM | POA: Diagnosis not present

## 2019-11-06 DIAGNOSIS — R918 Other nonspecific abnormal finding of lung field: Secondary | ICD-10-CM

## 2019-11-06 DIAGNOSIS — E039 Hypothyroidism, unspecified: Secondary | ICD-10-CM

## 2019-11-06 LAB — BASIC METABOLIC PANEL
Anion gap: 8 (ref 5–15)
BUN: 16 mg/dL (ref 8–23)
CO2: 28 mmol/L (ref 22–32)
Calcium: 8.7 mg/dL — ABNORMAL LOW (ref 8.9–10.3)
Chloride: 102 mmol/L (ref 98–111)
Creatinine, Ser: 0.5 mg/dL (ref 0.44–1.00)
GFR calc Af Amer: >60 mL/min (ref >60)
GFR calc non Af Amer: >60 mL/min (ref >60)
Glucose, Bld: 128 mg/dL — ABNORMAL HIGH (ref 70–99)
Potassium: 3.4 mmol/L — ABNORMAL LOW (ref 3.5–5.1)
Sodium: 138 mmol/L (ref 135–145)

## 2019-11-06 LAB — CBC
HCT: 26.8 % — ABNORMAL LOW (ref 36.0–46.0)
Hemoglobin: 8.2 g/dL — ABNORMAL LOW (ref 12.0–15.0)
MCH: 27.6 pg (ref 26.0–34.0)
MCHC: 30.6 g/dL (ref 30.0–36.0)
MCV: 90.2 fL (ref 80.0–100.0)
Platelets: 247 10*3/uL (ref 150–400)
RBC: 2.97 MIL/uL — ABNORMAL LOW (ref 3.87–5.11)
RDW: 14.5 % (ref 11.5–15.5)
WBC: 8.2 10*3/uL (ref 4.0–10.5)
nRBC: 0 % (ref 0.0–0.2)

## 2019-11-06 LAB — GLUCOSE, CAPILLARY
Glucose-Capillary: 96 mg/dL (ref 70–99)
Glucose-Capillary: 169 mg/dL — ABNORMAL HIGH (ref 70–99)
Glucose-Capillary: 117 mg/dL — ABNORMAL HIGH (ref 70–99)
Glucose-Capillary: 112 mg/dL — ABNORMAL HIGH (ref 70–99)

## 2019-11-06 LAB — MAGNESIUM: Magnesium: 1.7 mg/dL (ref 1.7–2.4)

## 2019-11-06 MED ORDER — MAGIC MOUTHWASH
1.0000 mL | Freq: Three times a day (TID) | ORAL | Status: DC | PRN
  Administered 2019-11-06 – 2019-11-08 (×2): 1 mL via ORAL
  Filled 2019-11-06 (×6): qty 5

## 2019-11-06 MED ORDER — POTASSIUM CHLORIDE 20 MEQ PO PACK
40.0000 meq | PACK | ORAL | Status: DC
  Filled 2019-11-06: qty 2

## 2019-11-06 MED ORDER — RIVAROXABAN 10 MG PO TABS: 10.0000 mg | ORAL_TABLET | Freq: Every day | ORAL | 0 refills | Status: DC

## 2019-11-06 MED ORDER — POTASSIUM CHLORIDE CRYS ER 20 MEQ PO TBCR
40.0000 meq | EXTENDED_RELEASE_TABLET | ORAL | Status: AC
  Administered 2019-11-06 (×2): 40 meq via ORAL
  Filled 2019-11-06 (×2): qty 2

## 2019-11-06 NOTE — Plan of Care (Signed)
  Problem: Health Behavior/Discharge Planning: Goal: Ability to manage health-related needs will improve Outcome: Progressing   Problem: Clinical Measurements: Goal: Ability to maintain clinical measurements within normal limits will improve Outcome: Progressing Goal: Will remain free from infection Outcome: Progressing Goal: Diagnostic test results will improve Outcome: Progressing Goal: Respiratory complications will improve Outcome: Progressing Goal: Cardiovascular complication will be avoided Outcome: Progressing   Problem: Activity: Goal: Risk for activity intolerance will decrease Outcome: Progressing   Problem: Nutrition: Goal: Adequate nutrition will be maintained Outcome: Progressing   Problem: Coping: Goal: Level of anxiety will decrease Outcome: Progressing   Problem: Elimination: Goal: Will not experience complications related to bowel motility Outcome: Progressing Goal: Will not experience complications related to urinary retention Outcome: Progressing   Problem: Pain Managment: Goal: General experience of comfort will improve Outcome: Progressing   Problem: Safety: Goal: Ability to remain free from injury will improve Outcome: Progressing   Problem: Skin Integrity: Goal: Risk for impaired skin integrity will decrease Outcome: Progressing   Problem: Education: Goal: Verbalization of understanding the information provided (i.e., activity precautions, restrictions, etc) will improve Outcome: Progressing   Problem: Activity: Goal: Ability to ambulate and perform ADLs will improve Outcome: Progressing   Problem: Clinical Measurements: Goal: Postoperative complications will be avoided or minimized Outcome: Progressing   Problem: Self-Concept: Goal: Ability to maintain and perform role responsibilities to the fullest extent possible will improve Outcome: Progressing   Problem: Pain Management: Goal: Pain level will decrease Outcome: Progressing

## 2019-11-06 NOTE — Progress Notes (Signed)
Physical Therapy Treatment Patient Details Name: [name] MRN: [id number] DOB: 1940-09-02 Today's Date: 11/06/2019    History of Present Illness R hip fx, s/p AA-THA on 11/04/19; PMH of DM, interstitial lung disease (uses 2L O2 prn at baseline)    PT Comments    Pt distracted by personal matters during treatment, becoming emotional and crying at times but motivated to get stronger and get home. Therapist attempts to educate pt on bed mobility to ease RLE burden and improve time, but pt not receptive and comes to EOB without physical assist, using elevated HOB, bedrail, and personal sequencing. Pt able to rise from EOB with min guard assist. Pt limited with ambulation, reports increased pain in anterior hip requiring seated rest breaks; continued step to pattern and slow, cautious cadence with RW. Therapist educated pt on step through pattern and encouraged pt with finish line in sight to ambulate further distance but requires 3 small bouts with seated rest in between. Pt returned to supine with personal sequencing and not receptive to therapist educating her; min assist to position self once in bed. Pt let in bed with call light in lap, 2 children in room and ice on incision site. Patient will benefit from continued physical therapy in hospital and recommendations below to increase strength, balance, endurance for safe ADLs and gait.   Follow Up Recommendations  SNF;Supervision/Assistance - 24 hour;Supervision for mobility/OOB     Equipment Recommendations  Rolling walker with 5" wheels;3in1 (PT)    Recommendations for Other Services       Precautions / Restrictions Precautions Precautions: Fall Restrictions Weight Bearing Restrictions: No RLE Weight Bearing: Weight bearing as tolerated    Mobility  Bed Mobility Overal bed mobility: Needs Assistance Bed Mobility: Supine to Sit;Sit to Supine  Supine to sit: Min guard;HOB elevated Sit to supine: Min guard;HOB elevated  General  bed mobility comments: attempted to educate pt on performing bed mobility but unreceptive and performs slow, labored movement requiring increased time and no physical assist from therapist  Transfers Overall transfer level: Needs assistance Equipment used: Rolling walker (2 wheeled) Transfers: Sit to/from Stand Sit to Stand: Min guard;From elevated surface  General transfer comment: cues for hand placement to push from EOB, therapist blocks RW for pt comfort, RLE slightly extended to rise  Ambulation/Gait Ambulation/Gait assistance: Min guard Gait Distance (Feet): 35 Feet Assistive device: Rolling walker (2 wheeled) Gait Pattern/deviations: Step-to pattern Gait velocity: decreased  General Gait Details: additional 78' and 22' with seated rest break between each ambulation attempt; slow, step to pattern with increased BUE weightbearing on RW, no unsteadiness or near falls   Stairs             Wheelchair Mobility    Modified Rankin (Stroke Patients Only)       Balance Overall balance assessment: History of Falls;Needs assistance Sitting-balance support: No upper extremity supported;Feet supported Sitting balance-Leahy Scale: Good     Standing balance support: Bilateral upper extremity supported;During functional activity Standing balance-Leahy Scale: Poor Standing balance comment: reliant on RW, fear of falling       Cognition Arousal/Alertness: Awake/alert Behavior During Therapy: WFL for tasks assessed/performed Overall Cognitive Status: Within Functional Limits for tasks assessed    General Comments: pt very specific on tecnique and not receptive to therapist education. Pt emotional regarding personal matters, tearful a few times, but able to motivate towards goals.      Exercises      General Comments  Pertinent Vitals/Pain Pain Assessment: Faces Faces Pain Scale: Hurts a little bit Pain Location: R hip with ambulation Pain Descriptors /  Indicators: Grimacing Pain Intervention(s): Limited activity within patient's tolerance;Monitored during session;Ice applied    Home Living               Prior Function            PT Goals (current goals can now be found in the care plan section) Acute Rehab PT Goals Patient Stated Goal: DC home PT Goal Formulation: With patient Time For Goal Achievement: 11/19/19 Potential to Achieve Goals: Good Progress towards PT goals: Progressing toward goals    Frequency    Min 5X/week      PT Plan Current plan remains appropriate    Co-evaluation              AM-PAC PT "6 Clicks" Mobility   Outcome Measure  Help needed turning from your back to your side while in a flat bed without using bedrails?: A Little Help needed moving from lying on your back to sitting on the side of a flat bed without using bedrails?: A Little Help needed moving to and from a bed to a chair (including a wheelchair)?: A Little Help needed standing up from a chair using your arms (e.g., wheelchair or bedside chair)?: A Little Help needed to walk in hospital room?: A Little Help needed climbing 3-5 steps with a railing? : A Lot 6 Click Score: 17    End of Session Equipment Utilized During Treatment: Gait belt Activity Tolerance: Patient tolerated treatment well Patient left: in bed;with call bell/phone within reach;with family/visitor present Nurse Communication: Mobility status PT Visit Diagnosis: Difficulty in walking, not elsewhere classified (R26.2);History of falling (Z91.81)     Time: 0145-0225 PT Time Calculation (min) (ACUTE ONLY): 40 min  Charges:  $Gait Training: 23-37 mins $Therapeutic Activity: 8-22 mins                      Tori Alycen Mack PT, DPT 11/06/19, 2:41 PM

## 2019-11-06 NOTE — NC FL2 (Signed)
[place] LEVEL OF CARE SCREENING TOOL     IDENTIFICATION  Patient Name: [name] Birthdate: 11-Oct-1940 Sex: female Admission Date (Current Location): 11/03/2019  [facility] and [place] Number:  Herbalist and Address:  [facility],  [place] [address] [place]      Provider Number:    Attending Physician Name and Address:  Desiree Hane, MD  Relative Name and Phone Number:  [name] son [phone]    Current Level of Care: Hospital Recommended Level of Care: [place] Prior Approval Number:    Date Approved/Denied:   PASRR Number: [id number]  Discharge Plan: SNF    Current Diagnoses: Patient Active Problem List   Diagnosis Date Noted  . Unwitnessed fall 11/04/2019  . Mediastinal adenopathy 11/04/2019  . Hypothyroidism 11/04/2019  . Fracture of femoral neck, right ([place]) 11/03/2019  . Essential hypertension 07/20/2018  . Hypercholesterolemia 07/20/2018  . Type 2 diabetes mellitus with hyperlipidemia ([place]) 07/20/2018  . Coronary artery calcification seen on CAT scan 07/20/2018  . Mass of left lung 05/22/2018  . Hypokalemia 05/13/2018  . Hypoxemia 05/13/2018  . Diabetes mellitus without complication ([place]) 09/98/3382  . Environmental allergies 02/07/2015  . IPF (idiopathic pulmonary fibrosis) ([place]) 07/15/2014  . Chronic cough 02/11/2014  . ILD (interstitial lung disease) ([place]) 11/18/2013  . Chronic rhinitis 10/02/2010  . GERD (gastroesophageal reflux disease) 10/02/2010  . INTERSTITIAL LUNG DISEASE 07/10/2010  . DEPRESSION/ANXIETY 04/12/2008  . Irritable bowel syndrome 10/09/2007  . STENOSIS OF RECTUM AND ANUS 10/09/2007  . DIARRHEA, CHRONIC 10/09/2007    Orientation RESPIRATION BLADDER Height & Weight     Self, Time, Situation, Place  O2 Continent Weight: 70.7 kg(as of 11/03/19 weight) Height:  5\' 6"  (167.6 cm)  BEHAVIORAL SYMPTOMS/MOOD NEUROLOGICAL BOWEL NUTRITION STATUS      Continent  Diet(CHO MOD)  AMBULATORY STATUS COMMUNICATION OF NEEDS Skin   Limited Assist Verbally Surgical wounds(R hip surgical incision)                       Personal Care Assistance Level of Assistance  Bathing, Dressing, Feeding Bathing Assistance: Limited assistance Feeding assistance: Independent Dressing Assistance: Limited assistance     Functional Limitations Info  Sight, Hearing, Speech Sight Info: Impaired(eyeglasses) Hearing Info: Adequate Speech Info: Impaired(Dentures-Uppers)    SPECIAL CARE FACTORS FREQUENCY  PT (By licensed PT), OT (By licensed OT)     PT Frequency: 5x week OT Frequency: 5x week            Contractures Contractures Info: Not present    Additional Factors Info  Code Status, Allergies Code Status Info: Full code Allergies Info: Penicillins, Influenza Vaccines, Clarithromycin, Cortisone, Povidone-iodine           Current Medications (11/06/2019):  This is the current hospital active medication list Current Facility-Administered Medications  Medication Dose Route Frequency Provider Last Rate Last Admin  . acetaminophen (TYLENOL) tablet 325-650 mg  325-650 mg Oral Q6H PRN Danae Orleans, PA-C      . ascorbic acid (VITAMIN C) tablet 1,000 mg  1,000 mg Oral QHS Babish, Matthew, PA-C   1,000 mg at 11/05/19 2205  . benzonatate (TESSALON) capsule 100 mg  100 mg Oral Q8H PRN Danae Orleans, PA-C      . celecoxib (CELEBREX) capsule 200 mg  200 mg Oral BID Babish, Matthew, PA-C   200 mg at 11/06/19 1005  . Chlorhexidine Gluconate Cloth 2 % PADS 6 each  6 each Topical Daily Paralee Cancel, MD   6 each at 11/04/19 1523  . docusate sodium (COLACE) capsule 100 mg  100 mg Oral BID Danae Orleans, PA-C   100 mg at 11/05/19 2205  . feeding supplement (ENSURE ENLIVE) (ENSURE ENLIVE) liquid 237 mL  237 mL Oral BID BM Danae Orleans, PA-C   237 mL at 11/04/19 1521  . ferrous sulfate tablet 325 mg  325 mg Oral TID PC Danae Orleans, PA-C   325 mg at 11/06/19  1005  . HYDROcodone-acetaminophen (NORCO) 7.5-325 MG per tablet 1-2 tablet  1-2 tablet Oral Q4H PRN Danae Orleans, PA-C      . HYDROcodone-acetaminophen (NORCO/VICODIN) 5-325 MG per tablet 1-2 tablet  1-2 tablet Oral Q4H PRN Danae Orleans, PA-C   1 tablet at 11/06/19 0159  . HYDROmorphone (DILAUDID) injection 0.5-1 mg  0.5-1 mg Intravenous Q2H PRN Danae Orleans, PA-C   1 mg at 11/04/19 1523  . insulin aspart (novoLOG) injection 0-9 Units  0-9 Units Subcutaneous TID WC Oretha Milch D, MD   2 Units at 11/05/19 1841  . levothyroxine (SYNTHROID) tablet 88 mcg  88 mcg Oral QAC breakfast Danae Orleans, PA-C   88 mcg at 11/06/19 0602  . menthol-cetylpyridinium (CEPACOL) lozenge 3 mg  1 lozenge Oral PRN Danae Orleans, PA-C       Or  . phenol (CHLORASEPTIC) mouth spray 1 spray  1 spray Mouth/Throat PRN Babish, Rodman Key, PA-C      . methocarbamol (ROBAXIN) tablet 500 mg  500 mg Oral Q6H PRN Danae Orleans, PA-C   500 mg at 11/04/19 2042   Or  . methocarbamol (ROBAXIN) 500 mg in dextrose 5 % 50 mL IVPB  500 mg Intravenous Q6H PRN Babish, Matthew, PA-C      . metoCLOPramide (REGLAN) tablet 5-10 mg  5-10 mg Oral Q8H PRN Danae Orleans, PA-C       Or  . metoCLOPramide (REGLAN) injection 5-10 mg  5-10 mg Intravenous Q8H PRN Babish, Matthew, PA-C      . multivitamin with minerals tablet 1 tablet  1 tablet Oral Daily Danae Orleans, PA-C   1 tablet at 11/06/19 1005  . ondansetron (ZOFRAN) tablet 4 mg  4 mg Oral Q6H PRN Danae Orleans, PA-C       Or  . ondansetron [facility]) injection 4 mg  4 mg Intravenous Q6H PRN Babish, Matthew, PA-C      . polyvinyl alcohol (LIQUIFILM TEARS) 1.4 % ophthalmic solution 1 drop  1 drop Left Eye TID PRN Hugelmeyer, Alexis, DO   1 drop at 11/04/19 2131  . pravastatin (PRAVACHOL) tablet 40 mg  40 mg Oral Daily Danae Orleans, PA-C   40 mg at 11/06/19 1005  . rivaroxaban (XARELTO) tablet 10 mg  10 mg Oral Daily Danae Orleans, PA-C   10 mg at 11/06/19 1005      Discharge Medications: Please see discharge summary for a list of discharge medications.  Relevant Imaging Results:  Relevant Lab Results:   Additional Information [id number], [place], [place]

## 2019-11-06 NOTE — TOC Initial Note (Signed)
Transition of Care [facility]) - Initial/Assessment Note    Patient Details  Name: [name] MRN: [id number] Date of Birth: 10-11-1940  Transition [facility]) CM/SW Contact:    Dessa Phi, RN Phone Number: 11/06/2019, 4:12 PM  Clinical Narrative:Patient too tearful to discuss more than agreement to fax out.PT recc SNF-patient agree to faxing out to SNF-await bed offers.                   Expected Discharge Plan: Skilled Nursing Facility Barriers to Discharge: Continued Medical Work up   Patient Goals and CMS Choice Patient states their goals for this hospitalization and ongoing recovery are:: go to rehab CMS Medicare.gov Compare Post Acute Care list provided to:: Patient Choice offered to / list presented to : Patient  Expected Discharge Plan and Services Expected Discharge Plan: [place]   Discharge Planning Services: CM Consult                                          Prior Living Arrangements/Services   Lives with:: Self Patient language and need for interpreter reviewed:: Yes Do you feel safe going back to the place where you live?: Yes      Need for Family Participation in Patient Care: No (Comment) Care giver support system in place?: Yes (comment) Current home services: DME(02) Criminal Activity/Legal Involvement Pertinent to Current Situation/Hospitalization: No - Comment as needed  Activities of Daily Living Home Assistive Devices/Equipment: Walker (specify type)(lift chair) ADL Screening (condition at time of admission) Patient's cognitive ability adequate to safely complete daily activities?: Yes Is the patient deaf or have difficulty hearing?: No Does the patient have difficulty seeing, even when wearing glasses/contacts?: Yes(has cataracts) Does the patient have difficulty concentrating, remembering, or making decisions?: Yes Patient able to express need for assistance with ADLs?: Yes Does the patient have difficulty dressing  or bathing?: Yes Independently performs ADLs?: No Communication: Independent Dressing (OT): Needs assistance Is this a change from baseline?: Change from baseline, expected to last >3 days Grooming: Independent Feeding: Independent Bathing: Needs assistance Is this a change from baseline?: Change from baseline, expected to last >3 days Toileting: Needs assistance Is this a change from baseline?: Change from baseline, expected to last >3days In/Out Bed: Needs assistance Is this a change from baseline?: Change from baseline, expected to last >3 days Walks in Home: Needs assistance Is this a change from baseline?: Change from baseline, expected to last >3 days Does the patient have difficulty walking or climbing stairs?: Yes Weakness of Legs: Right Weakness of Arms/Hands: None  Permission Sought/Granted Permission sought to share information with : Case Manager Permission granted to share information with : Yes, Verbal Permission Granted  Share Information with NAME: Case Manager     Permission granted to share info w Relationship: [name] [place] son [phone]     Emotional Assessment Appearance:: Appears stated age Attitude/Demeanor/Rapport: Gracious Affect (typically observed): Accepting Orientation: : Oriented to Self, Oriented to Place, Oriented to  Time, Oriented to Situation Alcohol / Substance Use: Not Applicable Psych Involvement: No (comment)  Admission diagnosis:  Fracture of femoral neck, right (HCC) [S72.001A] Closed fracture of neck of right femur, initial encounter ([place]) [S72.001A] Fall in home, initial encounter [W19.Merril Abbe, D14.970] Patient Active Problem List   Diagnosis Date Noted  . Unwitnessed fall 11/04/2019  . Mediastinal adenopathy 11/04/2019  . Hypothyroidism 11/04/2019  .  Fracture of femoral neck, right ([place]) 11/03/2019  . Essential hypertension 07/20/2018  . Hypercholesterolemia 07/20/2018  . Type 2 diabetes mellitus with hyperlipidemia ([place])  07/20/2018  . Coronary artery calcification seen on CAT scan 07/20/2018  . Mass of left lung 05/22/2018  . Hypokalemia 05/13/2018  . Hypoxemia 05/13/2018  . Diabetes mellitus without complication ([place]) 51/88/4166  . Environmental allergies 02/07/2015  . IPF (idiopathic pulmonary fibrosis) ([place]) 07/15/2014  . Chronic cough 02/11/2014  . ILD (interstitial lung disease) ([place]) 11/18/2013  . Chronic rhinitis 10/02/2010  . GERD (gastroesophageal reflux disease) 10/02/2010  . INTERSTITIAL LUNG DISEASE 07/10/2010  . DEPRESSION/ANXIETY 04/12/2008  . Irritable bowel syndrome 10/09/2007  . STENOSIS OF RECTUM AND ANUS 10/09/2007  . DIARRHEA, CHRONIC 10/09/2007   PCP:  [name], MD Pharmacy:   [id number] [name], [place] [address] Phone: [phone] Fax: [phone]     Social Determinants of Health (SDOH) Interventions    Readmission Risk Interventions No flowsheet data found.

## 2019-11-06 NOTE — Progress Notes (Signed)
   Subjective: 2 Days Post-Op Procedure(s) (LRB): TOTAL HIP ARTHROPLASTY ANTERIOR APPROACH (Right) Patient reports pain as mild.   Patient seen in rounds by Dr. Alvan Dame. Patient is well, and has had no acute complaints or problems other than discomfort in the right hip. No acute events overnight.  We will continue therapy today.   Objective: Vital signs in last 24 hours: Temp:  [97.4 F (36.3 C)-97.7 F (36.5 C)] 97.4 F (36.3 C) (05/28 0403) Pulse Rate:  [95-109] 100 (05/28 0403) Resp:  [17-18] 18 (05/28 0403) BP: (107-127)/(59-69) 114/59 (05/28 0403) SpO2:  [90 %-100 %] 90 % (05/28 0403)  Intake/Output from previous day:  Intake/Output Summary (Last 24 hours) at 11/06/2019 0748 Last data filed at 11/06/2019 0600 Gross per 24 hour  Intake 570 ml  Output 1700 ml  Net -1130 ml     Intake/Output this shift: No intake/output data recorded.  Labs: Recent Labs    11/03/19 1613 11/04/19 0313 11/05/19 0352 11/06/19 0332  HGB 10.0* 9.7* 9.5* 8.2*   Recent Labs    11/05/19 0352 11/06/19 0332  WBC 8.0 8.2  RBC 3.44* 2.97*  HCT 31.2* 26.8*  PLT 204 247   Recent Labs    11/05/19 0352 11/06/19 0332  NA 135 138  K 4.3 3.4*  CL 101 102  CO2 27 28  BUN 11 16  CREATININE 0.58 0.50  GLUCOSE 190* 128*  CALCIUM 9.2 8.7*   Recent Labs    11/03/19 2126  INR 1.1    Exam: General - Patient is Alert and Oriented Extremity - Neurologically intact Sensation intact distally Intact pulses distally Dorsiflexion/Plantar flexion intact Dressing - dressing C/D/I Motor Function - intact, moving foot and toes well on exam.   Past Medical History:  Diagnosis Date  . Bronchiectasis    PFTs August 23, 2010 VC 82%   dlco 62 > 129 CORRECTED  . Chronic diarrhea   . Chronic rhinitis    sinus CT ordered August 23, 2010  . Diabetes mellitus ([place])   . Hyperplastic colon polyp   . IBS (irritable bowel syndrome)   . ILD (interstitial lung disease) ([place])   . Stenosis of rectum and  anus   . Thyroid disease     Assessment/Plan: 2 Days Post-Op Procedure(s) (LRB): TOTAL HIP ARTHROPLASTY ANTERIOR APPROACH (Right) Active Problems:   INTERSTITIAL LUNG DISEASE   Mass of left lung   Type 2 diabetes mellitus with hyperlipidemia (HCC)   Fracture of femoral neck, right (HCC)   Unwitnessed fall   Mediastinal adenopathy   Hypothyroidism  Estimated body mass index is 25.16 kg/m as calculated from the following:   Height as of this encounter: 5\' 6"  (1.676 m).   Weight as of this encounter: 70.7 kg. Advance diet Up with therapy  DVT Prophylaxis - Xarelto Weight bearing as tolerated.  Hemoglobin stable at 8.2 this morning. Plan is to go Skilled nursing facility after hospital stay. From an orthopaedic standpoint, she is ready for discharge as long as she continues to meet her goals with PT and is medically stable. They have been trying to contact Dr. [name] to discuss her risk of discharging to SNF from his standpoint. She will follow up in the office in 2 weeks.   Griffith Citron, PA-C Orthopedic Surgery [phone] 11/06/2019, 7:48 AM

## 2019-11-06 NOTE — Telephone Encounter (Signed)
[name]  Please set up a televisit for him patient through the son's telephone number.  The son's name is Wille Glaser and the [phone] area code that is in the recent phone notes  This can be first thing after lunch today

## 2019-11-06 NOTE — Progress Notes (Signed)
[facility]  PROGRESS NOTE  [name] GUY:[id number] DOB: 1940-12-11 DOA: 11/03/2019 PCP: [name], MD Admit date - 11/03/2019   Admitting Physician Harvie Bridge, DO  Outpatient Primary MD for the patient is [name], MD  LOS - 3 Brief Narrative   [name] is a 79 y.o. year old female with medical history significant for hypothyroidism, chronic interstitial lung disease who presented on 11/03/2019 with right hip pain after falling while getting out of her chair and landing on her right hip with immediate onset of pain, shortening and inability to bear weight.  Denies any preceding symptoms of chest pain, palpitation, shortness of breath, vision changes.    In the ED, patient was afebrile hemodynamically stable, Covid test negative, lab work notable for hemoglobin of 10, CK 147.  Chest x-ray showed mild atelectasis with infiltrate within the left lung base and periphery right upper lobe, new mild patchy opacity overlying the mid to upper left lung.  Pelvic x-ray showed acute fracture of proximal right femur.  Patient was admitted for right femur neck fracture under [facility] management.  Patient underwent right hip replacement by Dr. Alvan Dame on 5/26.  Subjective   Patient reports pain well controlled.  Had a conversation with her pulmonologist today with her son as well.  A & P   Right hip displaced femoral neck fracture after ground-level fall.  Status post right hip replacement by Dr. Alvan Dame on 5/26.  Pain well controlled with oral regimen currently has not required any IV pain medicines last 24 hours. -IV Dilaudid as needed severe breakthrough pain -Norco/Vicodin every 4 hours as needed moderate pain -Hydrocodone/acetaminophen every 4 hours as needed for severe pain-Robaxin as needed muscle spasms -Antiemetics as needed -Colace twice daily -DVT prophylaxis with aspirin 81 mg twice daily x4 weeks, weightbearing as tolerated per Ortho recommendations -PT/OT recommends  continuing skilled PT at SNF to increase her independence and safety with mobility to eventually allow discharge to home  Unwitnessed fall.  Patient was down for at least 12 hours per her report.  CK unremarkable.  Status post right hip repair.  Denied any preceding symptoms reports prior history of similar fall.  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  CT chest shows interval development of left upper lobe opacity extending into the left hilum concerning for either pneumonia versus neoplasm with interval development of mediastinal lymphadenopathy with background of chronic interstitial lung disease.  TTE shows preserved EF with grade 1 diastolic dysfunction and moderately elevated Pulmonary artery systolic pressure -Followed by Dr. [name] (pulmonology) outpatient, have discussed biopsy at that time patient wanted to defer due to patient's husband's recent death - Patient and family wanting biopsy after discussion this hospital stay --Patient/family discussed with Dr. [name] on 5/28. Would recommend outpatient PET scan if patient able to be discharged over weekend, if patient is still in hospital on Tuesday would recommend inpatient pulmonary consultation for consideration of biopsy, patient/family agreeable with plan -Maintained normal oxygen saturation with ambulation while working with PT  Chronic interstitial lung disease. Uses 2 L [place] O2 intermittently at home per family. -continue O2 here -closely monitor  Type 2 diabetes. CBGs  140s-180s. A1c 6.2 -Holding home pioglitazone --diabetic diet -CBG monitoring, with sliding scale as needed.   Hyperlipidemia, stable -Continue pravastatin   Family Communication  :  Updated daughter and sons at bedside  Code Status :  FULL  Disposition Plan   :  Patient is from home. Anticipated d/c date: 24-48 hours. Barriers to d/c or necessity for inpatient status:  Medically stable for discharge to SNF as recommended by PT/OT, if patient unable to be discharged this weekend and still in hospital on Tuesday her outpatient pulmonologist recommends inpatient pulmonary consultation to discuss possible biopsy of known mass, otherwise recommend outpatient PET scan   Consults  :  ortho  Procedures  :  Total right hip arthroplasty by Dr. Alvan Dame on 5/26  DVT Prophylaxis  :  Defer to ortho who recommended aspirin, patient states can't take so on xarelto   Lab Results  Component Value Date   PLT 247 11/06/2019    Diet :  Diet Order            Diet Carb Modified Fluid consistency: Thin; Room service appropriate? Yes  Diet effective now               Inpatient Medications Scheduled Meds: . vitamin C  1,000 mg Oral QHS  . celecoxib  200 mg Oral BID  . Chlorhexidine Gluconate Cloth  6 each Topical Daily  . docusate sodium  100 mg Oral BID  . feeding supplement (ENSURE ENLIVE)  237 mL Oral BID BM  . ferrous sulfate  325 mg Oral TID PC  . insulin aspart  0-9 Units Subcutaneous TID WC  . levothyroxine  88 mcg Oral QAC breakfast  . multivitamin with minerals  1 tablet Oral Daily  . pravastatin  40 mg Oral Daily  . rivaroxaban  10 mg Oral Daily   Continuous Infusions: . methocarbamol (ROBAXIN) IV     PRN Meds:.acetaminophen, benzonatate, HYDROcodone-acetaminophen, HYDROcodone-acetaminophen, HYDROmorphone (DILAUDID) injection, magic mouthwash, menthol-cetylpyridinium **OR** phenol, methocarbamol **OR** methocarbamol (ROBAXIN) IV, metoCLOPramide **OR** metoCLOPramide (REGLAN) injection, ondansetron **OR** ondansetron (ZOFRAN) IV, polyvinyl alcohol  Antibiotics  :   Anti-infectives (From admission, onward)   Start     Dose/Rate Route Frequency Ordered Stop   11/04/19 1800  ceFAZolin (ANCEF) IVPB 2g/100 mL premix     2 g 200 mL/hr over 30  Minutes Intravenous Every 6 hours 11/04/19 1515 11/05/19 0059   11/04/19 1115  ceFAZolin (ANCEF) IVPB 2g/100 mL premix     2 g 200 mL/hr over 30 Minutes Intravenous On call to O.R. 11/04/19 1106 11/04/19 1158       Objective   Vitals:   11/05/19 1340 11/05/19 2158 11/06/19 0403 11/06/19 1437  BP: 107/69 127/62 (!) 114/59 (!) 106/56  Pulse: (!) 108 95 100 (!) 106  Resp: 17 18 18 17   Temp: (!) 97.5 F (36.4 C) 97.6 F (36.4 C) (!) 97.4 F (36.3 C) 98.3 F (36.8 C)  TempSrc: Oral Oral Oral Oral  SpO2: 100% 100% 90% 100%  Weight:      Height:        SpO2: 100 % O2 Flow Rate (L/min): 2 L/min  Wt Readings from Last 3 Encounters:  11/04/19 70.7 kg  08/14/19 72.6 kg  07/31/18 85.1 kg     Intake/Output Summary (Last 24 hours) at 11/06/2019 1824 Last data filed at 11/06/2019 1749 Gross per 24 hour  Intake 580 ml  Output 2200 ml  Net -1620 ml    Physical Exam:     Resting in bed comfortably No new F.N deficits,  [place].AT, Regular rate and rhythm, no peripheral edema Abdomen soft, nondistended, nontender Normal respiratory effort on 2 L [place],  no wheezing or rhonchi Right hip with postoperative dressing in place (clear, dry, intact)     I have personally reviewed the following:   Data Reviewed:  CBC Recent Labs  Lab 11/03/19 1613 11/04/19 0313 11/05/19 0352 11/06/19 0332  WBC 9.0 8.1 8.0 8.2  HGB 10.0* 9.7* 9.5* 8.2*  HCT 32.2* 32.2* 31.2* 26.8*  PLT 313 247 204 247  MCV 88.7 90.2 90.7 90.2  MCH 27.5 27.2 27.6 27.6  MCHC 31.1 30.1 30.4 30.6  RDW 14.7 15.1 14.2 14.5  LYMPHSABS 1.2  --   --   --   MONOABS 0.4  --   --   --   EOSABS 0.0  --   --   --   BASOSABS 0.0  --   --   --     Chemistries  Recent Labs  Lab 11/03/19 1613 11/04/19 0313 11/05/19 0352 11/06/19 0332  NA 137 134* 135 138  K 3.9 3.7 4.3 3.4*  CL 102 102 101 102  CO2 27 25 27 28   GLUCOSE 142* 186* 190* 128*  BUN 11 11 11 16   CREATININE 0.61 0.59 0.58 0.50  CALCIUM 9.3 8.5* 9.2  8.7*  MG  --   --   --  1.7  AST 22  --   --   --   ALT 14  --   --   --   ALKPHOS 79  --   --   --   BILITOT 0.4  --   --   --    ------------------------------------------------------------------------------------------------------------------ No results for input(s): CHOL, HDL, LDLCALC, TRIG, CHOLHDL, LDLDIRECT in the last 72 hours.  Lab Results  Component Value Date   HGBA1C 6.2 (H) 11/04/2019   ------------------------------------------------------------------------------------------------------------------ Recent Labs    11/05/19 0352  TSH 0.363   ------------------------------------------------------------------------------------------------------------------ No results for input(s): VITAMINB12, FOLATE, FERRITIN, TIBC, IRON, RETICCTPCT in the last 72 hours.  Coagulation profile Recent Labs  Lab 11/03/19 2126  INR 1.1    No results for input(s): DDIMER in the last 72 hours.  Cardiac Enzymes No results for input(s): CKMB, TROPONINI, MYOGLOBIN in the last 168 hours.  Invalid input(s): CK ------------------------------------------------------------------------------------------------------------------    Component Value Date/Time   BNP 82.1 05/14/2018 1016    Micro Results Recent Results (from the past 240 hour(s))  SARS Coronavirus 2 by RT PCR (hospital order, performed in [facility] hospital lab) Nasopharyngeal Nasopharyngeal Swab     Status: None   Collection Time: 11/03/19  7:30 PM   Specimen: Nasopharyngeal Swab  Result Value Ref Range Status   SARS Coronavirus 2 NEGATIVE NEGATIVE Final    Comment: (NOTE) SARS-CoV-2 target nucleic acids are NOT DETECTED. The SARS-CoV-2 RNA is generally detectable in upper and lower respiratory specimens during the acute phase of infection. The lowest concentration of SARS-CoV-2 viral copies this assay can detect is 250 copies / mL. A negative result does not preclude SARS-CoV-2 infection and should not be used as the  sole basis for treatment or other patient management decisions.  A negative result may occur with improper specimen collection / handling, submission of specimen other than nasopharyngeal swab, presence of viral mutation(s) within the areas targeted by this assay, and inadequate number of viral copies (<250 copies / mL). A negative result must be combined with clinical observations, patient history, and epidemiological information. Fact Sheet for Patients:   StrictlyIdeas.no Fact Sheet for Healthcare Providers: BankingDealers.co.za This test is not yet approved or cleared  by the [place] FDA and has  been authorized for detection and/or diagnosis of SARS-CoV-2 by FDA under an Emergency Use Authorization (EUA).  This EUA will remain in effect (meaning this test can be used) for the duration of the COVID-19 declaration under Section 564(b)(1) of the Act, 21 U.S.C. section 360bbb-3(b)(1), unless the authorization is terminated or revoked sooner. Performed at [facility], [place] [address] [place], [place] [address]   Surgical PCR screen     Status: None   Collection Time: 11/04/19  8:50 AM   Specimen: Nasal Mucosa; Nasal Swab  Result Value Ref Range Status   MRSA, PCR NEGATIVE NEGATIVE Final   Staphylococcus aureus NEGATIVE NEGATIVE Final    Comment: (NOTE) The Xpert SA Assay (FDA approved for NASAL specimens in patients 63 years of age and older), is one component of a comprehensive surveillance program. It is not intended to diagnose infection nor to guide or monitor treatment. Performed at [facility], [place] [address], [place], [place] [address]     Radiology Reports CT CHEST WO CONTRAST  Result Date: 11/04/2019 CLINICAL DATA:  New left upper lung opacity on chest x-ray. EXAM: CT CHEST WITHOUT CONTRAST TECHNIQUE: Multidetector CT imaging of the chest was performed following the standard  protocol without IV contrast. COMPARISON:  Chest x-ray 11/03/2019. Chest CT 05/14/2018. FINDINGS: Cardiovascular: The heart size is normal. No substantial pericardial effusion. Coronary artery calcification is evident. Atherosclerotic calcification is noted in the wall of the thoracic aorta. Mediastinum/Nodes: Mediastinal lymphadenopathy is progressive in the interval. 15 mm short axis precarinal node on 54/3 was 10 mm when I remeasure in a similar fashion on the prior study. 12 mm short axis subcarinal node on 62/3 was only about 8 mm short axis when I remeasure on the prior exam. 13 mm short axis prevascular node on 48/3 has increased from 9 mm previously. The esophagus has normal imaging features. There is no axillary lymphadenopathy. Lungs/Pleura: Subpleural reticulation noted bilaterally consistent with underlying chronic interstitial/fibrotic lung disease. Interval development of a 7.1 x 5.9 cm left upper lobe masslike opacity extending into the left hilum. Areas of peripheral consolidative opacity are similar to prior with a new 2.2 cm air cyst in the posterior right upper lobe. No substantial pleural effusion. Upper Abdomen: Unremarkable. Musculoskeletal: No worrisome lytic or sclerotic osseous abnormality. IMPRESSION: 1. Interval development of a 7.1 x 5.9 cm left upper lobe masslike opacity extending into the left hilum. While this could possibly represent pneumonia, imaging features are concerning for neoplasm. 2. Interval development of mediastinal lymphadenopathy. Metastatic disease not excluded. 3. Underlying chronic interstitial/fibrotic lung disease. 4. Aortic Atherosclerosis (ICD10-I70.0). Electronically Signed   By: Misty Stanley M.D.   On: 11/04/2019 09:52   Pelvis Portable  Result Date: 11/04/2019 CLINICAL DATA:  79 year old female status post right hip replacement. EXAM: PORTABLE PELVIS 1-2 VIEWS COMPARISON:  Intraoperative images earlier today. Portable pelvis radiograph yesterday.  FINDINGS: Portable AP view at 1417 hours. New right total hip arthroplasty. Hardware appears intact with normal AP alignment. No unexpected osseous changes identified. Postoperative soft tissue gas in the region. No new osseous abnormality. Visible bowel-gas pattern within normal limits. IMPRESSION: Right total hip arthroplasty with no adverse features. Electronically Signed   By: Genevie Ann M.D.   On: 11/04/2019 14:46   DG Pelvis Portable  Result Date: 11/03/2019 CLINICAL DATA:  Status post fall. EXAM: PORTABLE PELVIS 1-2 VIEWS COMPARISON:  None. FINDINGS: Acute fracture deformity is seen extending through the neck of the proximal right femur. No significant displacement  of the proximal and distal fracture sites is seen. There is no evidence of dislocation. No pelvic bone lesions are seen. IMPRESSION: Acute fracture of the proximal right femur. Electronically Signed   By: Virgina Norfolk M.D.   On: 11/03/2019 17:01   DG Chest Portable 1 View  Result Date: 11/03/2019 CLINICAL DATA:  Status post fall. EXAM: PORTABLE CHEST 1 VIEW COMPARISON:  May 13, 2018 FINDINGS: Mild-to-moderate severity diffuse, chronic appearing increased interstitial lung markings are seen. Mild biapical pleural thickening is noted. Mild atelectasis and/or infiltrate is seen within the left lung base and along the periphery of the right upper lobe. A 5.6 cm x 6.2 cm round patchy opacity is seen overlying the mid to upper left lung. This represents a new finding when compared to the prior exam. There is no evidence of a pleural effusion or pneumothorax. The heart size and mediastinal contours are within normal limits. The visualized skeletal structures are unremarkable. IMPRESSION: 1. Mild atelectasis and/or infiltrate within the left lung base and along the periphery of the right upper lobe. 2. New 5.6 cm x 6.2 cm round patchy opacity overlying the mid to upper left lung. While this may be infectious in etiology, the presence of a  large left lung mass cannot be excluded. Correlation with chest CT is recommended. Electronically Signed   By: Virgina Norfolk M.D.   On: 11/03/2019 17:05   DG C-Arm 1-60 Min-No Report  Result Date: 11/04/2019 Fluoroscopy was utilized by the requesting physician.  No radiographic interpretation.   ECHOCARDIOGRAM COMPLETE  Result Date: 11/05/2019    ECHOCARDIOGRAM REPORT   Patient Name:   Kimmy A Milledge Date of Exam: 11/05/2019 Medical Rec #:  [id number]      Height:       66.0 in Accession #:    [id number]     Weight:       155.9 lb Date of Birth:  August 17, 1940       BSA:          1.799 m Patient Age:    36 years       BP:           107/69 mmHg Patient Gender: F              HR:           98 bpm. Exam Location:  Inpatient Procedure: 2D Echo, Color Doppler and Cardiac Doppler Indications:    R06.9 DOE  History:        Patient has no prior history of Echocardiogram examinations.                 Risk Factors:Hypertension, Diabetes and Dyslipidemia.  Sonographer:    Raquel Sarna Senior RDCS Referring Phys: [id number] ALEXIS HUGELMEYER  Sonographer Comments: Scanned supine, 1 day post hip-surgery IMPRESSIONS  1. Left ventricular ejection fraction, by estimation, is 65 to 70%. The left ventricle has normal function. The left ventricle has no regional wall motion abnormalities. Left ventricular diastolic parameters are consistent with Grade I diastolic dysfunction (impaired relaxation).  2. Right ventricular systolic function is normal. The right ventricular size is normal. There is moderately elevated pulmonary artery systolic pressure. The estimated right ventricular systolic pressure is 30.8 mmHg.  3. The mitral valve is grossly normal. Trivial mitral valve regurgitation.  4. The aortic valve is tricuspid. Aortic valve regurgitation is not visualized.  5. The inferior vena cava is normal in size with greater than 50% respiratory variability, suggesting right atrial pressure of 3  mmHg. FINDINGS  Left Ventricle: Left  ventricular ejection fraction, by estimation, is 65 to 70%. The left ventricle has normal function. The left ventricle has no regional wall motion abnormalities. The left ventricular internal cavity size was normal in size. There is  no left ventricular hypertrophy. Left ventricular diastolic parameters are consistent with Grade I diastolic dysfunction (impaired relaxation). Indeterminate filling pressures. Right Ventricle: The right ventricular size is normal. No increase in right ventricular wall thickness. Right ventricular systolic function is normal. There is moderately elevated pulmonary artery systolic pressure. The tricuspid regurgitant velocity is 3.37 m/s, and with an assumed right atrial pressure of 3 mmHg, the estimated right ventricular systolic pressure is 30.8 mmHg. Left Atrium: Left atrial size was normal in size. Right Atrium: Right atrial size was normal in size. Pericardium: There is no evidence of pericardial effusion. Mitral Valve: The mitral valve is grossly normal. Trivial mitral valve regurgitation. Tricuspid Valve: The tricuspid valve is grossly normal. Tricuspid valve regurgitation is trivial. Aortic Valve: The aortic valve is tricuspid. Aortic valve regurgitation is not visualized. Pulmonic Valve: The pulmonic valve was normal in structure. Pulmonic valve regurgitation is not visualized. Aorta: The aortic root and ascending aorta are structurally normal, with no evidence of dilitation. Venous: The inferior vena cava is normal in size with greater than 50% respiratory variability, suggesting right atrial pressure of 3 mmHg. IAS/Shunts: No atrial level shunt detected by color flow Doppler.  LEFT VENTRICLE PLAX 2D LVIDd:         3.70 cm  Diastology LVIDs:         2.20 cm  LV e' lateral: 10.20 cm/s LV PW:         0.90 cm LV IVS:        0.90 cm LVOT diam:     1.80 cm LV SV:         35 LV SV Index:   20 LVOT Area:     2.54 cm  RIGHT VENTRICLE RV S prime:     18.20 cm/s TAPSE (M-mode): 2.2 cm  LEFT ATRIUM             Index       RIGHT ATRIUM           Index LA diam:        2.00 cm 1.11 cm/m  RA Area:     14.20 cm LA Vol (A2C):   39.8 ml 22.13 ml/m RA Volume:   39.10 ml  21.74 ml/m LA Vol (A4C):   46.1 ml 25.63 ml/m LA Biplane Vol: 47.7 ml 26.52 ml/m  AORTIC VALVE LVOT Vmax:   81.60 cm/s LVOT Vmean:  60.500 cm/s LVOT VTI:    0.138 m  AORTA Ao Root diam: 3.40 cm Ao Asc diam:  3.20 cm TRICUSPID VALVE TR Peak grad:   45.4 mmHg TR Vmax:        337.00 cm/s  SHUNTS Systemic VTI:  0.14 m Systemic Diam: 1.80 cm Lyman Bishop MD Electronically signed by Lyman Bishop MD Signature Date/Time: 11/05/2019/3:48:12 PM    Final    DG HIP OPERATIVE UNILAT W OR W/O PELVIS RIGHT  Result Date: 11/04/2019 CLINICAL DATA:  Right hip replacement EXAM: OPERATIVE RIGHT HIP WITH PELVIS COMPARISON:  None. FLUOROSCOPY TIME:  Radiation Exposure Index (as provided by the fluoroscopic device): Not available If the device does not provide the exposure index: Fluoroscopy Time:  10 seconds Number of Acquired Images:  5 FINDINGS: Multiple images were obtained during right hip replacement. No  acute bony or soft tissue abnormality is noted. IMPRESSION: Status post right hip replacement Electronically Signed   By: Inez Catalina M.D.   On: 11/04/2019 15:08     Time Spent in minutes  30     Desiree Hane M.D on 11/06/2019 at 6:24 PM  To [place] go to [email or web address] - password [facility]

## 2019-11-06 NOTE — Telephone Encounter (Signed)
Visit scheduled for today

## 2019-11-06 NOTE — Care Management Important Message (Signed)
Important Message  Patient Details IM Letter given to the Case Manager to present to the Patient Name: [name] MRN: [id number] Date of Birth: Aug 06, 1940   Medicare Important Message Given:  Yes     Kerin Salen 11/06/2019, 10:04 AM

## 2019-11-06 NOTE — Progress Notes (Signed)
07/24/17 0915   Pulmonary Integrated ILD questionnaire  Symptoms: Shortness of breath began gradually.  Patient has had shortness of breath for the last 12 years but has worsened over the last 3 to 4 weeks.  Cough started 11 to 12 years ago and it is still the same.  It is a moderate cough.  Do not cough at night.  To bring up phlegm.  Phlegm color is clear. Past medical history: Type 2 diabetes.  Thyroid disease.  One episode of pneumonia.  One episode of pleurisy Review of symptoms: Patient endorses: Fatigue, occasional joint stiffness, nausea, heartburn Family history: Patient denies any family history to pulmonary fibrosis, COPD, asthma, sarcoidosis, cystic fibrosis, hypersensitivity pneumonitis, autoimmune diseases. Exposure history: Former tobacco smoker quit 1977.  45-pack-year smoking history.  Denies cigar, pipe, vaping.  Denies non-tobacco use or illicit drug use. Home and hobby details: Single-family home.  [place] residence.  Have lived at home since 1975.  Age of home is 40.5 years.  This is not a damp living environment.  There is multi-mildew in shower curtain.  Bathroom does have mold and mildew.  Patient does not use humidifier or CPAP or nebulizer.  Patient does not seem iron.  Patient does not have a Jacuzzi or misting found.  Patient did have a pet parakeet as a teenager.  Patient does not have any pet gerbils hamsters rabbits or rodents in the house.  Patient does have feather pillows but there are 3 pillows not to sleep on.  There is not mold in her [facility] duct system.  She does not play any wind instruments.  Patient does like to garden (flowers).  Patient does have mulch in her outdoor gardens.  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  Patient had a parakeet as a teenager.  Patient patient has a feather duvet.  Patient does paint.  Patient sews.   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  Patient has cats and dogs at home. Medication history: Patient denies any medication exposures.  Serology - dec 2019 - negative, trace positie RNP and RF  OV 07/01/2018  Subjective:  Patient ID: [name], female , DOB: [birth year] , age 79 y.o. , MRN: [id number] , ADDRESS: [place] [id number]   07/01/2018 -   Chief Complaint  Patient presents with  . Follow-up    Pt states she is about the same since last visit. States she still has some SOB, has an occ cough with clear mucus. Denies any CP.   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 -   Presents for follow-up of the above issues.  I personally not seen her since 2016.  Back then because of her indeterminate nature of her ILD and offered surgical lung biopsy but she had refused.  Then she was lost to follow-up.  She tells me that around Thanksgiving 2019 she developed a viral infection and after that  she has had worsening shortness of breath and respiratory symptoms.  She slowly improved since then.  She says she still needs oxygen with walking.  She is working with physical therapy.  In fact walking desaturation test 185 feet x 3 laps on room air: She was unable to do 1 lap.  She had good gait.  She desaturated to 88% and had to stop.  This then resulted in recovery of oxygen.  Review of her scans show that even in 2018 her ILD was getting worse.  Subsequent scan that I personally visualized and done in December 2019 that showed progressive ILD  since 2015/2016.  I agree with the radiologist that its not consistent with a UIP pattern with upper lobe disease and more honeycombing in the upper lobe with air trapping.  It is very suspicious for indeterminate or alternative diagnostic category on radiology for 2018 ATS criteria.  The alternative diagnosis on radiology that would fit in most of this disease would be hypersensitive pneumonitis.  Review of her ILD questionnaire done in December 2019 by nurse practitioner shows she is a high risk candidate December 2019 and mold exposure the house.  The other consideration is autoimmune interstitial lung disease but her serology have been waxing and waning and most recently nearly negative  The other issue was the discovery of a left upper lobe lung mass greater than 2 cm.  She had a PET scan that shows this to be PET active.  Is also possible reactive mediastinal adenopathy.  She met with Dr. June Leap and was offered an navigational bronchoscopy with endobronchial ultrasound.  She needed some time to think about this because she felt overwhelmed.  She was not ready to do this last week.  After this the schedule was not available.  Therefore the procedure was not done.  She is telling me today that her husband sister 1 is [name] might in 2014 had surgical lung resection of stage I adenocarcinoma by Dr. Erasmo Leventhal and is doing really well.  [I reviewed the chart and confirmed the same].  She is wanting to see Dr. Erasmo Leventhal.  I reminded her that this was the original plan in 2015/2016 to get lung biopsy.  For her ILD     ROS  OV 07/31/2018  Subjective:  Patient ID: [name], female , DOB: 02/15/1941 , age 79 y.o. , MRN: [id number] , ADDRESS: [place] [address]   07/31/2018 -   Chief Complaint  Patient presents with  . Follow-up    PFT performed 1/27. Pt did have consultation with Dr. Roxan Hockey 1/29.  Pt had a CPST 2/11. Pt states she is about the same as  last visit and states she still becomes SOB when she exerts herself, has an occ cough with clear mucus. Denies any complaints of CP or chest tightness.   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 -returns for follow-up to discuss ongoing work-up for the above.  After my last visit she did see Dr. Erasmo Leventhal with thoracic surgery.  He was worried about surgical risk with left upper lobe lobectomy.  Also she had coronary artery calcification.  She saw Dr. see in cardiology on July 18, 2018.  He wanted to see the results of the CPS to before he decided whether he would do further  cardiac testing.  She has not had an echocardiogram at this point.  Then on July 22, 2018 she underwent cardiopulmonary stress testing.  In a nutshell the final report was believed that she had a mixture of circulatory and respiratory impairment.  Interestingly despite her ILD she only desaturated to 89% on a full maximal stress test. Exercise testing with gas exchange demonstrates a mildly reduced peak VO2 of 10.2 ml/kg/min (75% of the age/gender/weight matched sedentary norms). The RER of 1.14 indicates a maximal effort. When adjusted to the patient's ideal body weight of 144 lb (65.3 kg) the peak VO2 is 13.3 ml/kg (ibw)/min (83% of the ibw-adjusted predicted.  At this point in time she does not have follow-up with either Dr. Loletha Grayer or Dr. Roxan Hockey.  Her understanding is that they will review these results and then get back to her about surgical risk.  It appears that she is quite keen on getting the lung mass resected.  In terms of the ILD she had pulmonary function testing that shows progressive ILD.  She continues have significant shortness of breath and cough.  She is here with her husband to discuss all this.  She keeps asking circular questions about her  ILD and lung mass and treatment options.       OV 07/15/2019  Subjective:  Patient ID: [name], female , DOB: 1940-07-16 , age 66 y.o. 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  Patient identified with 2 person identifier.  Risks, benefits and limitations of telephone visit explained.  HPI [name] 79 y.o. -has been a year since his last saw patient.  In the interim overall she is stable although the cough is deteriorated significantly.  It is severe.  Over-the-counter medications are not helping.  She want something stronger.  She has had hydrocodone before for something else and has tolerated it well.  She has a lot of questions about getting Covid vaccine because she has history of allergies to several medications including flu shot and penicillin.  Upon closer questioning the seem like side effects of these medications which she sees numerous allergies.  She says primary care has deferred the decision on Covid vaccine to me.  I explained to her that with Covid vaccine with a history of other allergies this 8 fold high risk of getting anaphylaxis but overall risk is still less than 2%.  Based on this she is going to defer getting Covid vaccine.  She is fully aware that there is the Covid monoclonal antibody treatment to treat her acute Covid less than 10 days of symptoms and high risk individuals.  She is opted for that plan.  Of note in terms of her mediastinal adenopathy and her mass of the left upper lobe and interstitial lung disease she is completely not had any follow-up in 1 year.  She is not had biopsy.  This because her husband deceased.  In addition she has a cat who is diabetic.  Therefore she does not want to leave  the house in addition the COVID-19 pandemic has waiting into her social distancing and isolation.  In fact she does not want to come in to get a CT scan for at least another few  months.  She is asking for palliative relief of her severe cough.     SYMPTOM SCALE - ILD 07/31/2018   O2 use RA*  Shortness of Breath 0 -> 5 scale with 5 being worst (score 6 If unable to do)  At rest 0  Simple tasks - showers, clothes change, eating, shaving 3.5  Household (dishes, doing bed, laundry) 4  Shopping unable  Walking level at own pace 3  Walking keeping up with others of same age unable  Walking up Stairs unable  Walking up [place] unable  Total (40 - 48) Dyspnea Score 34.5  How bad is your cough? 5  How bad is your fatigue 4       Results for Brugh, LYNSI DOONER (MRN [id number]) as of 07/31/2018 15:57  Ref. Range 10/30/2013 10:43 07/15/2014 15:43 07/07/2018 15:50  FVC-Pre Latest Units: L 2.41 2.44 1.67  FVC-%Pred-Pre Latest Units: % 79 81 58   Results for Gerstner, Antia A (MRN [id number]) as of 07/31/2018 17:56  Ref. Range 07/07/2018 15:50  FEV1-Pre Latest Units: L 1.44  FEV1-%Pred-Pre Latest Units: % 67    Results for Racanelli, Darleen A (MRN [id number]) as of 07/31/2018 15:57  Ref. Range 10/30/2013 10:43 07/15/2014 15:43 07/07/2018 15:50  DLCO unc Latest Units: ml/min/mmHg 15.85 15.61 11.23  DLCO unc % pred Latest Units: % 61 60 43     OV 11/06/2019 - telephone visit. Called Jon  Subjective:  Patient ID: [name], female , DOB: 05/19/1941 , age 89 y.o. 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 - Son wanted this tele visit. Patient is in hospital.  Joint call with Bobette Mo and patient. Son did most of history. Seeral issues   1. Covid vaccine -  She is going to SNF and son concerned that she is not vaccinated against covid. They feel I recommended against covid vaccine .  I spoke to her and the son.  I explained to her that in February 2021 she cited multiple drug allergies.  I explained to her the side effect profile known about vaccines at that time.  At that time I thought it was her decision not to take the vaccine and I supported that based on her profile of concerns.  She listed a lot of allergies but they seem more like side effects as opposed to true allergy.  However today she is telling me and the son is also tells me that I strongly advocated against the vaccines based on the same data that is described above.  I do not recollect this.  She and son are quite upset at me because of this.  The concern now is that because she is going to a skilled nursing facility she is going to be at risk for Covid disease without the vaccination.  Son reported that primary care physician has advised her to get the Covid vaccine.  I explained to them ultimately is their decision to take the vaccine.  I did discuss the benefits of the vaccine.  I said if it is not true allergies but only side effects then the risk of any  allergic reaction is even lower.  Even if it is to allergies risk of allergic reaction is low at less than 2%.  There is also extended monitoring for people with allergic reactions..  Recommended Pfizer or [employer] as preferred choices if they decided to take vaccine.  After detailed discussion I did indicate that at this point benefit of vaccination outweigh the risk as long as they understood what the risk is and it was a risk that they were accepting of.  Also indicated that if they felt that I recommended against the vaccination and I was wrong about it that I would just respect the sentiment although that was not my recollection and documentation  of the conversation.  3. Poor customer service -son reported that after the February 2021 for telephone visit he tried to get hold of her office but I never called back.  He has been upset that I recommended against the Covid vaccine.  In review of the record it appears that he made calls in March 2021 and April 2021.  On both occasions he was told that he needed to have the healthcare power of attorney form.  I do not recollect this conversation between triage/front desk being routed to me.   2. Covid prevention SNF -son and patient feel that in the absence of Covid vaccination going to the skilled nursing facility is going to increase her risk of Covid.  They wanted no other risk between being in a hospital facility for rehab and skilled nursing facility.  I advised him I do not know the answer for this.  However I did advise that with strong Covid protocols, masking, distancing and with majority of the population worker and patient being vaccinated perhaps the risk is low.  I also indicated that monoclonal antibody treatment is available in case she gets Covid.    3. Cough -son noticed after hospitalization that her cough is resolved.  I did discuss ILD.  Did indicate that one of her concerns is that she might have hypersensitivity pneumonitis.  Discussed the fact that she might have a feather pillow or a feather blanket at home.  In addition possible mold exposure at home.  Advised that while she has had a rehab facility he could go home and do an inspection and get rid of feather pillow and feather blanket and also ensure all mold is removed.  4.  Lung mass: Son is asking about working this up while in the hospital.  Speaking with the hospitalist Dr. Lonny Prude it appears that patient is close to discharge.  Advised that if the patient is here past Memorial Day Monday then to consider inpatient consult.  Meanwhile I recommended that I will arrange for outpatient PET scan and evaluation by Dr. Baltazar Apo  or Dr. June Leap of lung mass specialist so that the appropriate work-up can be done.  I did indicate that there is a possibility that the hip fracture itself could be due to bony metastasis.  PET scan would be beneficial.      ROS - per HPI  IMPRESSION: CT CHEST 1. Interval development of a 7.1 x 5.9 cm left upper lobe masslike opacity extending into the left hilum. While this could possibly represent pneumonia, imaging features are concerning for neoplasm. 2. Interval development of mediastinal lymphadenopathy. Metastatic disease not excluded. 3. Underlying chronic interstitial/fibrotic lung disease. 4. Aortic Atherosclerosis (ICD10-I70.0).   Electronically Signed   By: Misty Stanley M.D.   On: 11/04/2019 09:52   has  a past medical history of Bronchiectasis, Chronic diarrhea, Chronic rhinitis, Diabetes mellitus ([place]), Hyperplastic colon polyp, IBS (irritable bowel syndrome), ILD (interstitial lung disease) ([place]), Stenosis of rectum and anus, and Thyroid disease.   reports that she quit smoking about 43 years ago. Her smoking use included cigarettes. She has a 45.00 pack-year smoking history. She has never used smokeless tobacco.  Past Surgical History:  Procedure Laterality Date  .  gallbaldder    . CHOLECYSTECTOMY    . DILATION AND CURETTAGE OF UTERUS    . HAND SURGERY  1991   left hand; Baker's Cyst removed  . migraine    . tear duct surgery    . TOTAL HIP ARTHROPLASTY Right 11/04/2019   Procedure: TOTAL HIP ARTHROPLASTY ANTERIOR APPROACH;  Surgeon: Paralee Cancel, MD;  Location: [facility];  Service: Orthopedics;  Laterality: Right;    Allergies  Allergen Reactions  . Penicillins Diarrhea    Has patient had a PCN reaction causing immediate rash, facial/tongue/throat swelling, SOB or lightheadedness with hypotension: Unknown Has patient had a PCN reaction causing severe rash involving mucus membranes or skin necrosis: Unknown Has patient had a PCN reaction that  required hospitalization: No  Has patient had a PCN reaction occurring within the last 10 years: No  If all of the above answers are "NO", then may proceed with Cephalosporin use.   . Influenza Vaccines Other (See Comments)    Severe nausea, vomiting and body aches-md told her not to take it anymore  . Clarithromycin     Unknown reaction   . Cortisone Other (See Comments)    "Made her feel like ice water was running through her veins"   . Povidone-Iodine     Unknown reaction     Immunization History  Administered Date(s) Administered  . Influenza Split 05/11/2014    Family History  Problem Relation Age of Onset  . Colon polyps Sister   . Heart disease Sister   . Diabetes Sister   . Heart disease Mother   . Colon cancer Neg Hx     No current facility-administered medications for this visit.  Current Outpatient Medications:  .  celecoxib (CELEBREX) 200 MG capsule, Take 1 capsule (200 mg total) by mouth 2 (two) times daily., Disp: 60 capsule, Rfl: 0 .  docusate sodium (COLACE) 100 MG capsule, Take 1 capsule (100 mg total) by mouth 2 (two) times daily., Disp: 10 capsule, Rfl: 0 .  ferrous sulfate 325 (65 FE) MG tablet, Take 1 tablet (325 mg total) by mouth 3 (three) times daily after meals for 14 days., Disp: 42 tablet, Rfl: 0 .  HYDROcodone-acetaminophen (NORCO/VICODIN) 5-325 MG tablet, Take 1-2 tablets by mouth every 6 (six) hours as needed for moderate pain or severe pain (pain score 4-6)., Disp: 42 tablet, Rfl: 0 .  rivaroxaban (XARELTO) 10 MG TABS tablet, Take 1 tablet (10 mg total) by mouth daily for 21 days., Disp: 21 tablet, Rfl: 0  Facility-Administered Medications Ordered in Other Visits:  .  acetaminophen (TYLENOL) tablet 325-650 mg, 325-650 mg, Oral, Q6H PRN, Babish, Matthew, PA-C .  ascorbic acid (VITAMIN C) tablet 1,000 mg, 1,000 mg, Oral, QHS, Babish, Matthew, PA-C, 1,000 mg at 11/05/19 2205 .  benzonatate (TESSALON) capsule 100 mg, 100 mg, Oral, Q8H PRN, Babish,  Matthew, PA-C .  celecoxib (CELEBREX) capsule 200 mg, 200 mg, Oral, BID, Babish, Matthew, PA-C, 200 mg at 11/06/19 1005 .  Chlorhexidine Gluconate Cloth 2 % PADS 6 each, 6 each, Topical, Daily, Paralee Cancel, MD,  6 each at 11/04/19 1523 .  docusate sodium (COLACE) capsule 100 mg, 100 mg, Oral, BID, Danae Orleans, PA-C, 100 mg at 11/05/19 2205 .  feeding supplement (ENSURE ENLIVE) (ENSURE ENLIVE) liquid 237 mL, 237 mL, Oral, BID BM, Babish, Matthew, PA-C, 237 mL at 11/04/19 1521 .  ferrous sulfate tablet 325 mg, 325 mg, Oral, TID PC, Babish, Matthew, PA-C, 325 mg at 11/06/19 1005 .  HYDROcodone-acetaminophen (NORCO) 7.5-325 MG per tablet 1-2 tablet, 1-2 tablet, Oral, Q4H PRN, Danae Orleans, PA-C .  HYDROcodone-acetaminophen (NORCO/VICODIN) 5-325 MG per tablet 1-2 tablet, 1-2 tablet, Oral, Q4H PRN, Danae Orleans, PA-C, 1 tablet at 11/06/19 0159 .  HYDROmorphone (DILAUDID) injection 0.5-1 mg, 0.5-1 mg, Intravenous, Q2H PRN, Danae Orleans, PA-C, 1 mg at 11/04/19 1523 .  insulin aspart (novoLOG) injection 0-9 Units, 0-9 Units, Subcutaneous, TID WC, Desiree Hane, MD, 2 Units at 11/05/19 1841 .  levothyroxine (SYNTHROID) tablet 88 mcg, 88 mcg, Oral, QAC breakfast, Danae Orleans, PA-C, 88 mcg at 11/06/19 0602 .  menthol-cetylpyridinium (CEPACOL) lozenge 3 mg, 1 lozenge, Oral, PRN **OR** phenol (CHLORASEPTIC) mouth spray 1 spray, 1 spray, Mouth/Throat, PRN, Babish, Matthew, PA-C .  methocarbamol (ROBAXIN) tablet 500 mg, 500 mg, Oral, Q6H PRN, 500 mg at 11/04/19 2042 **OR** methocarbamol (ROBAXIN) 500 mg in dextrose 5 % 50 mL IVPB, 500 mg, Intravenous, Q6H PRN, Babish, Matthew, PA-C .  metoCLOPramide (REGLAN) tablet 5-10 mg, 5-10 mg, Oral, Q8H PRN **OR** metoCLOPramide (REGLAN) injection 5-10 mg, 5-10 mg, Intravenous, Q8H PRN, Babish, Matthew, PA-C .  multivitamin with minerals tablet 1 tablet, 1 tablet, Oral, Daily, Babish, Matthew, PA-C, 1 tablet at 11/06/19 1005 .  ondansetron (ZOFRAN) tablet 4  mg, 4 mg, Oral, Q6H PRN **OR** ondansetron (ZOFRAN) injection 4 mg, 4 mg, Intravenous, Q6H PRN, Babish, Matthew, PA-C .  polyvinyl alcohol (LIQUIFILM TEARS) 1.4 % ophthalmic solution 1 drop, 1 drop, Left Eye, TID PRN, Hugelmeyer, Alexis, DO, 1 drop at 11/04/19 2131 .  potassium chloride SA (KLOR-CON) CR tablet 40 mEq, 40 mEq, Oral, Q2H, Nettey, Shayla D, MD .  pravastatin (PRAVACHOL) tablet 40 mg, 40 mg, Oral, Daily, Babish, Matthew, PA-C, 40 mg at 11/06/19 1005 .  rivaroxaban (XARELTO) tablet 10 mg, 10 mg, Oral, Daily, Danae Orleans, PA-C, 10 mg at 11/06/19 1005      Objective:   There were no vitals filed for this visit.  Estimated body mass index is 25.16 kg/m as calculated from the following:   Height as of 11/04/19: 5' 6"  (1.676 m).   Weight as of 11/04/19: 155 lb 13.8 oz (70.7 kg).  @WEIGHTCHANGE @  There were no vitals filed for this visit. Is a phone visitr  Physical Exam     ICD-10-CM   1. Mass of upper lobe of left lung  R91.8   2. Mediastinal adenopathy  R59.0   3. ILD (interstitial lung disease) ([place])  J84.9   4. Vaccine counseling  Z71.89     Patient Instructions     ICD-10-CM   1. Mass of upper lobe of left lung  R91.8   2. Mediastinal adenopathy  R59.0   3. ILD (interstitial lung disease) ([place])  J84.9   4. Vaccine counseling  Z71.89    Please set up outpatient PET scan and also outpatient follow-up with Dr. Leory Plowman Icard of Dr. Baltazar Apo after the PET scan in the next 1-2 weeks   ( Level 05 visit: Estb 40-54 min  in  visit type: on-site physical face to visit  in total care time  and counseling or/and coordination of care by this undersigned MD - Dr Brand Males. This includes one or more of the following on this same day 11/06/2019: pre-charting, chart review, note writing, documentation discussion of test results, diagnostic or treatment recommendations, prognosis, risks and benefits of management options, instructions, education, compliance or  risk-factor reduction. It excludes time spent by the [place] or office staff in the care of the patient. Actual time 24 min)      SIGNATURE    Dr. Brand Males, M.D., F.C.C.P,  Pulmonary and Critical Care Medicine Staff Physician, [place] Director - Interstitial Lung Disease  Program  Pulmonary [place] at [place], [place], [address]  Pager: [phone], If no answer or between  15:00h - 7:00h: call [phone] Telephone: [phone]  1:55 PM 11/06/2019

## 2019-11-06 NOTE — Patient Instructions (Addendum)
ICD-10-CM   1. Mass of upper lobe of left lung  R91.8   2. Mediastinal adenopathy  R59.0   3. ILD (interstitial lung disease) ([place])  J84.9   4. Vaccine counseling  Z71.89    Please set up outpatient PET scan and also outpatient follow-up with Dr. Leory Plowman Icard of Dr. Baltazar Apo after the PET scan in the next 1-2 weeks

## 2019-11-07 DIAGNOSIS — S72001A Fracture of unspecified part of neck of right femur, initial encounter for closed fracture: Principal | ICD-10-CM

## 2019-11-07 DIAGNOSIS — Y92009 Unspecified place in unspecified non-institutional (private) residence as the place of occurrence of the external cause: Secondary | ICD-10-CM

## 2019-11-07 DIAGNOSIS — W19XXXA Unspecified fall, initial encounter: Secondary | ICD-10-CM

## 2019-11-07 LAB — CBC
HCT: 24.1 % — ABNORMAL LOW (ref 36.0–46.0)
Hemoglobin: 7.4 g/dL — ABNORMAL LOW (ref 12.0–15.0)
MCH: 27.7 pg (ref 26.0–34.0)
MCHC: 30.7 g/dL (ref 30.0–36.0)
MCV: 90.3 fL (ref 80.0–100.0)
Platelets: 265 10*3/uL (ref 150–400)
RBC: 2.67 MIL/uL — ABNORMAL LOW (ref 3.87–5.11)
RDW: 15.2 % (ref 11.5–15.5)
WBC: 8.4 10*3/uL (ref 4.0–10.5)
nRBC: 0 % (ref 0.0–0.2)

## 2019-11-07 LAB — BASIC METABOLIC PANEL
Anion gap: 6 (ref 5–15)
BUN: 13 mg/dL (ref 8–23)
CO2: 28 mmol/L (ref 22–32)
Calcium: 8.5 mg/dL — ABNORMAL LOW (ref 8.9–10.3)
Chloride: 104 mmol/L (ref 98–111)
Creatinine, Ser: 0.54 mg/dL (ref 0.44–1.00)
GFR calc Af Amer: >60 mL/min (ref >60)
GFR calc non Af Amer: >60 mL/min (ref >60)
Glucose, Bld: 118 mg/dL — ABNORMAL HIGH (ref 70–99)
Potassium: 4.7 mmol/L (ref 3.5–5.1)
Sodium: 138 mmol/L (ref 135–145)

## 2019-11-07 LAB — GLUCOSE, CAPILLARY
Glucose-Capillary: 124 mg/dL — ABNORMAL HIGH (ref 70–99)
Glucose-Capillary: 145 mg/dL — ABNORMAL HIGH (ref 70–99)
Glucose-Capillary: 109 mg/dL — ABNORMAL HIGH (ref 70–99)
Glucose-Capillary: 135 mg/dL — ABNORMAL HIGH (ref 70–99)

## 2019-11-07 LAB — PREPARE RBC (CROSSMATCH)

## 2019-11-07 LAB — OCCULT BLOOD X 1 CARD TO LAB, STOOL: Fecal Occult Bld: NEGATIVE

## 2019-11-07 MED ORDER — SODIUM CHLORIDE 0.9% IV SOLUTION: Freq: Once | INTRAVENOUS | Status: AC

## 2019-11-07 NOTE — Progress Notes (Signed)
Physical Therapy Treatment Patient Details Name: [name] MRN: [id number] DOB: [birth year] Today's Date: 11/07/2019    History of Present Illness R hip fx, s/p AA-THA on 11/04/19; PMH of DM, interstitial lung disease (uses 2L O2 prn at baseline)    PT Comments    Pt making good progress.  Pt prefers to perform transfers and exercises on her own.  Offered suggestions for transfer technique but pt prefers her own methods, but did demonstrate with good safety.  Cued for exercise ROM and form and how to perform AAROM on her own.  Able to increased gait.  Cont POC.     Follow Up Recommendations  SNF;Supervision/Assistance - 24 hour     Equipment Recommendations  Rolling walker with 5" wheels;3in1 (PT)    Recommendations for Other Services       Precautions / Restrictions Precautions Precautions: Fall Restrictions Weight Bearing Restrictions: No RLE Weight Bearing: Weight bearing as tolerated    Mobility  Bed Mobility Overal bed mobility: Needs Assistance             General bed mobility comments: Pt at EOB upon arrival - family present  Transfers Overall transfer level: Needs assistance Equipment used: Rolling walker (2 wheeled) Transfers: Sit to/from Stand Sit to Stand: Min guard         General transfer comment: Performed x 2; demonstrated safe hand placment; cued to monitor R hip IR (pt reports I know, I just hadn't fixed it yet)  Ambulation/Gait Ambulation/Gait assistance: Min guard Gait Distance (Feet): 120 Feet(75' then 120' with seated rest break) Assistive device: Rolling walker (2 wheeled) Gait Pattern/deviations: Step-to pattern     General Gait Details: 28' then 120' feet after seated rest break; cued for posture and progression to partial reciprocal gait   Stairs             Wheelchair Mobility    Modified Rankin (Stroke Patients Only)       Balance   Sitting-balance support: No upper extremity supported;Feet supported Sitting  balance-Leahy Scale: Good     Standing balance support: During functional activity;No upper extremity supported Standing balance-Leahy Scale: Fair Standing balance comment: able to take hands off RW to adjust face mask                            Cognition Arousal/Alertness: Awake/alert Behavior During Therapy: WFL for tasks assessed/performed Overall Cognitive Status: Within Functional Limits for tasks assessed                                 General Comments: Pt very specific on techniques for transfers and not receptive to further suggestions by therapy.      Exercises General Exercises - Lower Extremity Ankle Circles/Pumps: AROM;Both;10 reps Quad Sets: AROM;Both;10 reps Long Arc Quad: AROM;Both;10 reps Heel Slides: AROM;Right;10 reps Hip ABduction/ADduction: AROM;Right;10 reps    General Comments General comments (skin integrity, edema, etc.): On 2 LPM O2 with sats 98%.  HR 100-115 bpm.  Pt required cues for safety and that min guard necessary with ambulation for safety due to recent hip fx and low hgb.      Pertinent Vitals/Pain Pain Assessment: No/denies pain    Home Living                      Prior Function  PT Goals (current goals can now be found in the care plan section) Progress towards PT goals: Progressing toward goals    Frequency    Min 5X/week      PT Plan Current plan remains appropriate    Co-evaluation              AM-PAC PT "6 Clicks" Mobility   Outcome Measure  Help needed turning from your back to your side while in a flat bed without using bedrails?: A Little Help needed moving from lying on your back to sitting on the side of a flat bed without using bedrails?: A Little Help needed moving to and from a bed to a chair (including a wheelchair)?: None Help needed standing up from a chair using your arms (e.g., wheelchair or bedside chair)?: None Help needed to walk in hospital room?:  None Help needed climbing 3-5 steps with a railing? : A Little 6 Click Score: 21    End of Session Equipment Utilized During Treatment: Gait belt Activity Tolerance: Patient tolerated treatment well Patient left: in bed;with call bell/phone within reach;with family/visitor present Nurse Communication: Mobility status PT Visit Diagnosis: Difficulty in walking, not elsewhere classified (R26.2);History of falling (Z91.81)     Time: 1140-1210 PT Time Calculation (min) (ACUTE ONLY): 30 min  Charges:  $Gait Training: 8-22 mins $Therapeutic Exercise: 8-22 mins                     Maggie Font, PT Acute Rehab Services Pager [phone] [facility] [phone] [facility] [phone]    Karlton Lemon 11/07/2019, 12:29 PM

## 2019-11-07 NOTE — Progress Notes (Signed)
Pt is alert and oriented x 4. Completed 1 unit of PRBC's without any adverse events. Pt tolerated the transfusion well. Vitals remained stable. Monitoring closely.

## 2019-11-07 NOTE — Plan of Care (Signed)
  Problem: Health Behavior/Discharge Planning: Goal: Ability to manage health-related needs will improve Outcome: Progressing   Problem: Clinical Measurements: Goal: Ability to maintain clinical measurements within normal limits will improve Outcome: Progressing Goal: Will remain free from infection Outcome: Progressing Goal: Diagnostic test results will improve Outcome: Progressing Goal: Respiratory complications will improve Outcome: Progressing Goal: Cardiovascular complication will be avoided Outcome: Progressing   Problem: Activity: Goal: Risk for activity intolerance will decrease Outcome: Progressing   Problem: Nutrition: Goal: Adequate nutrition will be maintained Outcome: Progressing   Problem: Coping: Goal: Level of anxiety will decrease Outcome: Progressing   Problem: Elimination: Goal: Will not experience complications related to bowel motility Outcome: Progressing Goal: Will not experience complications related to urinary retention Outcome: Progressing   Problem: Pain Managment: Goal: General experience of comfort will improve Outcome: Progressing   Problem: Safety: Goal: Ability to remain free from injury will improve Outcome: Progressing   Problem: Skin Integrity: Goal: Risk for impaired skin integrity will decrease Outcome: Progressing   Problem: Education: Goal: Verbalization of understanding the information provided (i.e., activity precautions, restrictions, etc) will improve Outcome: Progressing   Problem: Activity: Goal: Ability to ambulate and perform ADLs will improve Outcome: Progressing   Problem: Clinical Measurements: Goal: Postoperative complications will be avoided or minimized Outcome: Progressing   Problem: Self-Concept: Goal: Ability to maintain and perform role responsibilities to the fullest extent possible will improve Outcome: Progressing   Problem: Pain Management: Goal: Pain level will decrease Outcome: Progressing

## 2019-11-07 NOTE — Progress Notes (Addendum)
PROGRESS NOTE    Carnesha Maravilla Mcduffee  EYC:[id number] DOB: 1940/10/03 DOA: 11/03/2019 PCP: [name], MD    Brief Narrative:[name] is a 79 y.o. year old female with medical history significant for hypothyroidism, chronic interstitial lung disease who presented on 11/03/2019 with right hip pain after falling while getting out of her chair and landing on her right hip with immediate onset of pain, shortening and inability to bear weight.  Denies any preceding symptoms of chest pain, palpitation, shortness of breath, vision changes.    In the ED, patient was afebrile hemodynamically stable, Covid test negative, lab work notable for hemoglobin of 10, CK 147.  Chest x-ray showed mild atelectasis with infiltrate within the left lung base and periphery right upper lobe, new mild patchy opacity overlying the mid to upper left lung.  Pelvic x-ray showed acute fracture of proximal right femur.  Patient was admitted for right femur neck fracture under TRH management.  Patient underwent right hip replacement by Dr. Alvan Dame on 5/26.  Assessment & Plan:   Active Problems:   INTERSTITIAL LUNG DISEASE   Mass of left lung   Type 2 diabetes mellitus with hyperlipidemia (HCC)   Fracture of femoral neck, right (HCC)   Unwitnessed fall   Mediastinal adenopathy   Hypothyroidism    Right hip displaced femoral neck fracture after ground-level fall.  Status post right hip replacement by Dr. Alvan Dame on 5/26.  Continue as needed narcotics for pain control.  Zofran as needed. Bowel regimen with Colace DVT prophylaxis per Ortho. -PT/OT recommends continuing skilled PT at SNF to increase her independence and safety with mobility to eventually allow discharge to home  Unwitnessed fall.  Patient was down for at least 12 hours per her report.  CK unremarkable.  Status post right hip repair.  Denied any preceding symptoms reports prior history of similar fall.  -PT/OT eval recommend SNF  Hypothyroidism, stable TSH  within normal limits -Continue Synthroid  Left lung base mass measuring 7.1 x 5.9 cm with mediastinal adenopathy CXR on admission showed concern for potential left upper lobe mass.  CT chest shows interval development of left upper lobe opacity extending into the left hilum concerning for either pneumonia versus neoplasm with interval development of mediastinal lymphadenopathy with background of chronic interstitial lung disease.   TTE shows preserved EF with grade 1 diastolic dysfunction and moderately elevated Pulmonary artery systolic pressure -Followed by Dr. [name] (pulmonology) outpatient, have discussed biopsy at that time patient wanted to defer due to patient's husband's recent death - Patient and family wanting biopsy after discussion this hospital stay --Patient/family discussed with Dr. [name] on 5/28. Would recommend outpatient PET scan if patient able to be discharged over weekend, if patient is still in hospital on Tuesday would recommend inpatient pulmonary consultation for consideration of biopsy, patient/family agreeable with plan -Maintained normal oxygen saturation with ambulation while working with PT   Chronic interstitial lung disease. Uses 2 L [place] O2 intermittently at home per family. -continue O2 here -closely monitor  Type 2 diabetes.  On SSI.  On pioglitazone at home. CBG (last 3)  Recent Labs    11/06/19 1752 11/06/19 2203 11/07/19 0847  GLUCAP 117* 112* 124*    Hyperlipidemia, stable -Continue pravastatin  Anemia likely Abla hemoglobin 7.4 down from 8.2. Transfuse 1 unit of packed RBC. Check FOBT.  Nutrition Problem: Increased nutrient needs Etiology: hip fracture, post-op healing     Signs/Symptoms: estimated needs    Interventions: Ensure Enlive (each supplement provides  350kcal and 20 grams of protein), MVI  Estimated body mass index is 25.16 kg/m as calculated from the following:   Height as of this encounter: 5\' 6"  (1.676 m).    Weight as of this encounter: 70.7 kg.  DVT prophylaxis: Aspirin per Ortho Code Status: Full code Family Communication: None Disposition Plan:  Status is: Inpatient Patient is from home. Anticipated d/c date:24-48 hours. Barriers to d/c or necessity for inpatient status: Medically stable for discharge to SNF as recommended by PT/OT, if patient unable to be discharged this weekend and still in hospital on Tuesday her outpatient pulmonologist recommends inpatient pulmonary consultation to discuss possible biopsy of known mass, otherwise recommend outpatient PET scan    Dispo: The patient is from: Home              Anticipated d/c is to: SNF              Anticipated d/c date is: Unknown              Patient currently is not medically stable to d/c. Patient is getting blood transfusion status post hip surgery.    Consultants: Ortho PCCM  Procedures: Right hip ORIF Antimicrobials:  None Subjective:  She is resting in bed she is awake and alert this reported she did not sleep for 2 nights slept better last night. Objectiv: Reports having a bowel movement Vitals:   11/06/19 0403 11/06/19 1437 11/06/19 2116 11/07/19 0516  BP: (!) 114/59 (!) 106/56 (!) 111/53 122/60  Pulse: 100 (!) 106 (!) 103 (!) 102  Resp: 18 17 18 16   Temp: (!) 97.4 F (36.3 C) 98.3 F (36.8 C) 98 F (36.7 C) 98 F (36.7 C)  TempSrc: Oral Oral Oral Oral  SpO2: 90% 100% 100% 100%  Weight:      Height:        Intake/Output Summary (Last 24 hours) at 11/07/2019 1110 Last data filed at 11/07/2019 0516 Gross per 24 hour  Intake 330 ml  Output 300 ml  Net 30 ml   Filed Weights   11/03/19 2111 11/04/19 1124  Weight: 70.7 kg 70.7 kg    Examination:  General exam: Appears calm and comfortable  Respiratory system: Clear to auscultation. Respiratory effort normal. Cardiovascular system: S1 & S2 heard, RRR. No JVD, murmurs, rubs, gallops or clicks. No pedal edema. Gastrointestinal system: Abdomen is  nondistended, soft and nontender. No organomegaly or masses felt. Normal bowel sounds heard. Central nervous system: Alert and oriented. No focal neurological deficits. Extremities: Symmetric 5 x 5 power.  Right hip dressings in place Skin: No rashes, lesions or ulcers Psychiatry: Judgement and insight appear normal. Mood & affect appropriate.     Data Reviewed: I have personally reviewed following labs and imaging studies  CBC: Recent Labs  Lab 11/03/19 1613 11/04/19 0313 11/05/19 0352 11/06/19 0332 11/07/19 0318  WBC 9.0 8.1 8.0 8.2 8.4  NEUTROABS 7.4  --   --   --   --   HGB 10.0* 9.7* 9.5* 8.2* 7.4*  HCT 32.2* 32.2* 31.2* 26.8* 24.1*  MCV 88.7 90.2 90.7 90.2 90.3  PLT 313 247 204 247 993   Basic Metabolic Panel: Recent Labs  Lab 11/03/19 1613 11/04/19 0313 11/05/19 0352 11/06/19 0332 11/07/19 0318  NA 137 134* 135 138 138  K 3.9 3.7 4.3 3.4* 4.7  CL 102 102 101 102 104  CO2 27 25 27 28 28   GLUCOSE 142* 186* 190* 128* 118*  BUN 11 11 11  16  13  CREATININE 0.61 0.59 0.58 0.50 0.54  CALCIUM 9.3 8.5* 9.2 8.7* 8.5*  MG  --   --   --  1.7  --    GFR: Estimated Creatinine Clearance: 54.3 mL/min (by C-G formula based on SCr of 0.54 mg/dL). Liver Function Tests: Recent Labs  Lab 11/03/19 1613  AST 22  ALT 14  ALKPHOS 79  BILITOT 0.4  PROT 7.8  ALBUMIN 2.9*   No results for input(s): LIPASE, AMYLASE in the last 168 hours. No results for input(s): AMMONIA in the last 168 hours. Coagulation Profile: Recent Labs  Lab 11/03/19 2126  INR 1.1   Cardiac Enzymes: Recent Labs  Lab 11/03/19 1613  CKTOTAL 147   BNP (last 3 results) No results for input(s): PROBNP in the last 8760 hours. HbA1C: No results for input(s): HGBA1C in the last 72 hours. CBG: Recent Labs  Lab 11/06/19 0744 11/06/19 1159 11/06/19 1752 11/06/19 2203 11/07/19 0847  GLUCAP 96 169* 117* 112* 124*   Lipid Profile: No results for input(s): CHOL, HDL, LDLCALC, TRIG, CHOLHDL,  LDLDIRECT in the last 72 hours. Thyroid Function Tests: Recent Labs    11/05/19 0352  TSH 0.363   Anemia Panel: No results for input(s): VITAMINB12, FOLATE, FERRITIN, TIBC, IRON, RETICCTPCT in the last 72 hours. Sepsis Labs: No results for input(s): PROCALCITON, LATICACIDVEN in the last 168 hours.  Recent Results (from the past 240 hour(s))  SARS Coronavirus 2 by RT PCR (hospital order, performed in [facility] hospital lab) Nasopharyngeal Nasopharyngeal Swab     Status: None   Collection Time: 11/03/19  7:30 PM   Specimen: Nasopharyngeal Swab  Result Value Ref Range Status   SARS Coronavirus 2 NEGATIVE NEGATIVE Final    Comment: (NOTE) SARS-CoV-2 target nucleic acids are NOT DETECTED. The SARS-CoV-2 RNA is generally detectable in upper and lower respiratory specimens during the acute phase of infection. The lowest concentration of SARS-CoV-2 viral copies this assay can detect is 250 copies / mL. A negative result does not preclude SARS-CoV-2 infection and should not be used as the sole basis for treatment or other patient management decisions.  A negative result may occur with improper specimen collection / handling, submission of specimen other than nasopharyngeal swab, presence of viral mutation(s) within the areas targeted by this assay, and inadequate number of viral copies (<250 copies / mL). A negative result must be combined with clinical observations, patient history, and epidemiological information. Fact Sheet for Patients:   StrictlyIdeas.no Fact Sheet for Healthcare Providers: BankingDealers.co.za This test is not yet approved or cleared  by the [place] FDA and has been authorized for detection and/or diagnosis of SARS-CoV-2 by FDA under an Emergency Use Authorization (EUA).  This EUA will remain in effect (meaning this test can be used) for the duration of the COVID-19 declaration under Section 564(b)(1) of the  Act, 21 U.S.C. section 360bbb-3(b)(1), unless the authorization is terminated or revoked sooner. Performed at [facility], [place] [address], [place],  [address]   Surgical PCR screen     Status: None   Collection Time: 11/04/19  8:50 AM   Specimen: Nasal Mucosa; Nasal Swab  Result Value Ref Range Status   MRSA, PCR NEGATIVE NEGATIVE Final   Staphylococcus aureus NEGATIVE NEGATIVE Final    Comment: (NOTE) The Xpert SA Assay (FDA approved for NASAL specimens in patients 39 years of age and older), is one component of a comprehensive surveillance program. It is not intended to diagnose infection nor to  guide or monitor treatment. Performed at [facility], [place] [address] [place], [place] [address]          Radiology Studies: ECHOCARDIOGRAM COMPLETE  Result Date: 11/05/2019    ECHOCARDIOGRAM REPORT   Patient Name:   [name] Date of Exam: 11/05/2019 Medical Rec #:  [id number]      Height:       66.0 in Accession #:    [id number]     Weight:       155.9 lb Date of Birth:  [birth year]       BSA:          1.799 m Patient Age:    57 years       BP:           107/69 mmHg Patient Gender: F              HR:           98 bpm. Exam Location:  Inpatient Procedure: 2D Echo, Color Doppler and Cardiac Doppler Indications:    R06.9 DOE  History:        Patient has no prior history of Echocardiogram examinations.                 Risk Factors:Hypertension, Diabetes and Dyslipidemia.  Sonographer:    Raquel Sarna Senior RDCS Referring Phys: [id number] ALEXIS HUGELMEYER  Sonographer Comments: Scanned supine, 1 day post hip-surgery IMPRESSIONS  1. Left ventricular ejection fraction, by estimation, is 65 to 70%. The left ventricle has normal function. The left ventricle has no regional wall motion abnormalities. Left ventricular diastolic parameters are consistent with Grade I diastolic dysfunction (impaired relaxation).  2. Right ventricular systolic function is normal. The  right ventricular size is normal. There is moderately elevated pulmonary artery systolic pressure. The estimated right ventricular systolic pressure is 27.0 mmHg.  3. The mitral valve is grossly normal. Trivial mitral valve regurgitation.  4. The aortic valve is tricuspid. Aortic valve regurgitation is not visualized.  5. The inferior vena cava is normal in size with greater than 50% respiratory variability, suggesting right atrial pressure of 3 mmHg. FINDINGS  Left Ventricle: Left ventricular ejection fraction, by estimation, is 65 to 70%. The left ventricle has normal function. The left ventricle has no regional wall motion abnormalities. The left ventricular internal cavity size was normal in size. There is  no left ventricular hypertrophy. Left ventricular diastolic parameters are consistent with Grade I diastolic dysfunction (impaired relaxation). Indeterminate filling pressures. Right Ventricle: The right ventricular size is normal. No increase in right ventricular wall thickness. Right ventricular systolic function is normal. There is moderately elevated pulmonary artery systolic pressure. The tricuspid regurgitant velocity is 3.37 m/s, and with an assumed right atrial pressure of 3 mmHg, the estimated right ventricular systolic pressure is 35.0 mmHg. Left Atrium: Left atrial size was normal in size. Right Atrium: Right atrial size was normal in size. Pericardium: There is no evidence of pericardial effusion. Mitral Valve: The mitral valve is grossly normal. Trivial mitral valve regurgitation. Tricuspid Valve: The tricuspid valve is grossly normal. Tricuspid valve regurgitation is trivial. Aortic Valve: The aortic valve is tricuspid. Aortic valve regurgitation is not visualized. Pulmonic Valve: The pulmonic valve was normal in structure. Pulmonic valve regurgitation is not visualized. Aorta: The aortic root and ascending aorta are structurally normal, with no evidence of dilitation. Venous: The inferior vena  cava is normal in size with greater than 50% respiratory variability, suggesting right atrial pressure of 3  mmHg. IAS/Shunts: No atrial level shunt detected by color flow Doppler.  LEFT VENTRICLE PLAX 2D LVIDd:         3.70 cm  Diastology LVIDs:         2.20 cm  LV e' lateral: 10.20 cm/s LV PW:         0.90 cm LV IVS:        0.90 cm LVOT diam:     1.80 cm LV SV:         35 LV SV Index:   20 LVOT Area:     2.54 cm  RIGHT VENTRICLE RV S prime:     18.20 cm/s TAPSE (M-mode): 2.2 cm LEFT ATRIUM             Index       RIGHT ATRIUM           Index LA diam:        2.00 cm 1.11 cm/m  RA Area:     14.20 cm LA Vol (A2C):   39.8 ml 22.13 ml/m RA Volume:   39.10 ml  21.74 ml/m LA Vol (A4C):   46.1 ml 25.63 ml/m LA Biplane Vol: 47.7 ml 26.52 ml/m  AORTIC VALVE LVOT Vmax:   81.60 cm/s LVOT Vmean:  60.500 cm/s LVOT VTI:    0.138 m  AORTA Ao Root diam: 3.40 cm Ao Asc diam:  3.20 cm TRICUSPID VALVE TR Peak grad:   45.4 mmHg TR Vmax:        337.00 cm/s  SHUNTS Systemic VTI:  0.14 m Systemic Diam: 1.80 cm Lyman Bishop MD Electronically signed by Lyman Bishop MD Signature Date/Time: 11/05/2019/3:48:12 PM    Final         Scheduled Meds:  vitamin C  1,000 mg Oral QHS   celecoxib  200 mg Oral BID   Chlorhexidine Gluconate Cloth  6 each Topical Daily   docusate sodium  100 mg Oral BID   feeding supplement (ENSURE ENLIVE)  237 mL Oral BID BM   ferrous sulfate  325 mg Oral TID PC   insulin aspart  0-9 Units Subcutaneous TID WC   levothyroxine  88 mcg Oral QAC breakfast   multivitamin with minerals  1 tablet Oral Daily   pravastatin  40 mg Oral Daily   rivaroxaban  10 mg Oral Daily   Continuous Infusions:  methocarbamol (ROBAXIN) IV       LOS: 4 days     Georgette Shell, MD  11/07/2019, 11:10 AM

## 2019-11-08 ENCOUNTER — Inpatient Hospital Stay (HOSPITAL_COMMUNITY): Payer: Medicare Other

## 2019-11-08 ENCOUNTER — Other Ambulatory Visit: Payer: Self-pay | Admitting: Cardiovascular Disease

## 2019-11-08 LAB — TYPE AND SCREEN
ABO/RH(D): B POS
Antibody Screen: NEGATIVE
Unit division: 0

## 2019-11-08 LAB — BASIC METABOLIC PANEL
Anion gap: 5 (ref 5–15)
BUN: 11 mg/dL (ref 8–23)
CO2: 29 mmol/L (ref 22–32)
Calcium: 8.6 mg/dL — ABNORMAL LOW (ref 8.9–10.3)
Chloride: 103 mmol/L (ref 98–111)
Creatinine, Ser: 0.39 mg/dL — ABNORMAL LOW (ref 0.44–1.00)
GFR calc Af Amer: >60 mL/min (ref >60)
GFR calc non Af Amer: >60 mL/min (ref >60)
Glucose, Bld: 134 mg/dL — ABNORMAL HIGH (ref 70–99)
Potassium: 3.8 mmol/L (ref 3.5–5.1)
Sodium: 137 mmol/L (ref 135–145)

## 2019-11-08 LAB — BPAM RBC
Blood Product Expiration Date: 202106222359
ISSUE DATE / TIME: 202105291406
Unit Type and Rh: 7300

## 2019-11-08 LAB — GLUCOSE, CAPILLARY
Glucose-Capillary: 147 mg/dL — ABNORMAL HIGH (ref 70–99)
Glucose-Capillary: 138 mg/dL — ABNORMAL HIGH (ref 70–99)
Glucose-Capillary: 137 mg/dL — ABNORMAL HIGH (ref 70–99)
Glucose-Capillary: 113 mg/dL — ABNORMAL HIGH (ref 70–99)

## 2019-11-08 LAB — CBC
HCT: 28.8 % — ABNORMAL LOW (ref 36.0–46.0)
Hemoglobin: 8.6 g/dL — ABNORMAL LOW (ref 12.0–15.0)
MCH: 26.2 pg (ref 26.0–34.0)
MCHC: 29.9 g/dL — ABNORMAL LOW (ref 30.0–36.0)
MCV: 87.8 fL (ref 80.0–100.0)
Platelets: 281 10*3/uL (ref 150–400)
RBC: 3.28 MIL/uL — ABNORMAL LOW (ref 3.87–5.11)
RDW: 17.6 % — ABNORMAL HIGH (ref 11.5–15.5)
WBC: 7.9 10*3/uL (ref 4.0–10.5)
nRBC: 0 % (ref 0.0–0.2)

## 2019-11-08 MED ORDER — MAGIC MOUTHWASH
5.0000 mL | Freq: Three times a day (TID) | ORAL | Status: DC
  Administered 2019-11-08 – 2019-11-12 (×5): 5 mL via ORAL
  Filled 2019-11-08 (×18): qty 5

## 2019-11-08 NOTE — Progress Notes (Addendum)
PROGRESS NOTE    Arnold Depinto Defilippo  PVX:[id number] DOB: 1940/11/03 DOA: 11/03/2019 PCP: [name], MD  Brief Narrative:Roni A Pageis a 79 y.o.year old femalewith medical history significant for hypothyroidism, chronic interstitial lung disease who presented on 5/25/2021with right hip pain after falling while getting out of her chair and landing on her right hip with immediate onset of pain, shortening and inability to bear weight. Denies any preceding symptoms of chest pain, palpitation, shortness of breath, vision changes.   In the ED, patient was afebrile hemodynamically stable, Covid test negative, lab work notable for hemoglobin of 10, CK 147. Chest x-ray showed mild atelectasis with infiltrate within the left lung base and periphery right upper lobe, new mild patchy opacity overlying the mid to upper left lung. Pelvic x-ray showed acute fracture of proximal right femur. Patient was admitted for right femur neck fracture under TRH management. Patient underwent right hip replacement by Dr. Alvan Dame on 5/26.  Assessment & Plan:   Active Problems:   INTERSTITIAL LUNG DISEASE   Mass of left lung   Type 2 diabetes mellitus with hyperlipidemia (HCC)   Fracture of femoral neck, right (HCC)   Unwitnessed fall   Mediastinal adenopathy   Hypothyroidism   Fall at home    Right hip displaced femoral neck fracture after ground-level fall. Status post right hip replacement by Dr. Alvan Dame on 5/26.  Continue as needed narcotics for pain control.  Zofran as needed. Bowel regimen with Colace DVT prophylaxis per Ortho. -PT/OT recommends continuing skilled PT at SNF to increase her independence and safety with mobility to eventually allow discharge to home  Unwitnessed fall.Patient was down for at least 12 hours per her report. CK unremarkable. Status post right hip repair. Denied any preceding symptoms reports prior history of similar fall.  -PT/OT evalrecommend SNF  Hypothyroidism,  stableTSH within normal limits -Continue Synthroid  Left lung base mass measuring 7.1 x 5.9 cm with mediastinal adenopathy CXR on admission showed concern for potential left upper lobe mass.CT chest shows interval development of left upper lobe opacity extending into the left hilum concerning for either pneumonia versus neoplasm with interval development of mediastinal lymphadenopathy with background of chronic interstitial lung disease.  TTE shows preserved EF with grade 1 diastolic dysfunction and moderately elevated Pulmonary artery systolic pressure -Followed by Dr. [name] (pulmonology) outpatient, have discussed biopsy at that time patient wanted to defer due to patient's husband's recent death -Patient and family wanting biopsy after discussion this hospital stay --Patient/familydiscussed with Dr. [name] on 5/28. Would recommend outpatient PET scan if patient able to be discharged over weekend, if patient is still in hospital on Tuesday wouldrecommend inpatient pulmonary consultation for consideration of biopsy, patient/family agreeable with plan -Maintained normal oxygen saturation with ambulation while working with PT   Chronic interstitial lung disease.Uses 2 L [place] O2 intermittently at home per family. -continue O2 here -closely monitor   Type 2 diabetes.  On SSI.  On pioglitazone at home. 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  Hemoglobin 8.6 up from 7.4.  FOBT negative.             Sacral bump nontender, patient reports this was not there yesterday.  Will order sacral x-ray.negative.  Nutrition Problem: Increased  nutrient needs Etiology: hip fracture, post-op healing     Signs/Symptoms: estimated needs    Interventions: Ensure Enlive  (each supplement provides 350kcal and 20 grams of protein), MVI  Estimated body mass index is 25.16 kg/m as calculated from the following:   Height as of this encounter: 5\' 6"  (1.676 m).   Weight as of this encounter: 70.7 kg. DVT prophylaxis: Aspirin per Ortho Code Status: Full code Family Communication: None Disposition Plan:  Status is: InpatientPatient is fromhome. Anticipated d/c date:24-48 hours. Barriers to d/c or necessity for inpatient status:Medically stable for discharge to SNF as recommended by PT/OT, if patient unable to be discharged this weekend and still in hospital on Tuesday her outpatient pulmonologist recommends inpatient pulmonary consultation to discuss possible biopsy of known mass, otherwise recommend outpatient PET scan   Dispo: The patient is from: Home  Anticipated d/c is to: SNF  Anticipated d/c date is: Unknown  Patient currently is not medically stable to d/c. Patient is getting blood transfusion status post hip surgery.    Consultants: Ortho PCCM  Procedures: Right hip ORIF Antimicrobials:  None   Subjective: Patient complains about the lump or bump she can feel in the right side of her sacrum which was not there when she fell this bump is not tender.  Objective: Vitals:   11/07/19 1447 11/07/19 1758 11/07/19 2208 11/08/19 0602  BP: (!) 120/58 (!) 114/59 125/78 (!) 122/56  Pulse: 90 78 89 83  Resp: 18 18 17 16   Temp: 97.7 F (36.5 C) 98 F (36.7 C) 98.2 F (36.8 C) 98 F (36.7 C)  TempSrc: Oral Oral Oral Oral  SpO2: 100% 100%    Weight:      Height:        Intake/Output Summary (Last 24 hours) at 11/08/2019 1021 Last data filed at 11/08/2019 1017 Gross per 24 hour  Intake 840 ml  Output 1075 ml  Net -235 ml   Filed Weights   11/03/19 2111 11/04/19 1124  Weight: 70.7 kg 70.7 kg    Examination:  General exam: Appears calm and comfortable  Respiratory system: Clear to auscultation.  Respiratory effort normal. Cardiovascular system: S1 & S2 heard, RRR. No JVD, murmurs, rubs, gallops or clicks. No pedal edema. Gastrointestinal system: Abdomen is nondistended, soft and nontender. No organomegaly or masses felt. Normal bowel sounds heard. Central nervous system: Alert and oriented. No focal neurological deficits. Extremities: Symmetric 5 x 5 power.  Soft bump palpated on the right side of the sacrum nontender no erythema noted. Skin: No rashes, lesions or ulcers Psychiatry: Judgement and insight appear normal. Mood & affect appropriate.     Data Reviewed: I have personally reviewed following labs and imaging studies  CBC: Recent Labs  Lab 11/03/19 1613 11/03/19 1613 11/04/19 0313 11/05/19 0352 11/06/19 0332 11/07/19 0318 11/08/19 0439  WBC 9.0   < > 8.1 8.0 8.2 8.4 7.9  NEUTROABS 7.4  --   --   --   --   --   --   HGB 10.0*   < > 9.7* 9.5* 8.2* 7.4* 8.6*  HCT 32.2*   < > 32.2* 31.2* 26.8* 24.1* 28.8*  MCV 88.7   < > 90.2 90.7 90.2 90.3 87.8  PLT 313   < > 247 204 247 265 281   < > = values in this interval not displayed.   Basic Metabolic Panel: Recent Labs  Lab 11/04/19 0313 11/05/19 0352 11/06/19 0332 11/07/19 0318 11/08/19 0439  NA 134* 135 138  138 137  K 3.7 4.3 3.4* 4.7 3.8  CL 102 101 102 104 103  CO2 25 27 28 28 29   GLUCOSE 186* 190* 128* 118* 134*  BUN 11 11 16 13 11   CREATININE 0.59 0.58 0.50 0.54 0.39*  CALCIUM 8.5* 9.2 8.7* 8.5* 8.6*  MG  --   --  1.7  --   --    GFR: Estimated Creatinine Clearance: 54.3 mL/min (A) (by C-G formula based on SCr of 0.39 mg/dL (L)). Liver Function Tests: Recent Labs  Lab 11/03/19 1613  AST 22  ALT 14  ALKPHOS 79  BILITOT 0.4  PROT 7.8  ALBUMIN 2.9*   No results for input(s): LIPASE, AMYLASE in the last 168 hours. No results for input(s): AMMONIA in the last 168 hours. Coagulation Profile: Recent Labs  Lab 11/03/19 2126  INR 1.1   Cardiac Enzymes: Recent Labs  Lab 11/03/19 1613  CKTOTAL  147   BNP (last 3 results) No results for input(s): PROBNP in the last 8760 hours. HbA1C: No results for input(s): HGBA1C in the last 72 hours. CBG: Recent Labs  Lab 11/07/19 0847 11/07/19 1306 11/07/19 1744 11/07/19 2203 11/08/19 0736  GLUCAP 124* 145* 109* 135* 147*   Lipid Profile: No results for input(s): CHOL, HDL, LDLCALC, TRIG, CHOLHDL, LDLDIRECT in the last 72 hours. Thyroid Function Tests: No results for input(s): TSH, T4TOTAL, FREET4, T3FREE, THYROIDAB in the last 72 hours. Anemia Panel: No results for input(s): VITAMINB12, FOLATE, FERRITIN, TIBC, IRON, RETICCTPCT in the last 72 hours. Sepsis Labs: No results for input(s): PROCALCITON, LATICACIDVEN in the last 168 hours.  Recent Results (from the past 240 hour(s))  SARS Coronavirus 2 by RT PCR (hospital order, performed in [facility] hospital lab) Nasopharyngeal Nasopharyngeal Swab     Status: None   Collection Time: 11/03/19  7:30 PM   Specimen: Nasopharyngeal Swab  Result Value Ref Range Status   SARS Coronavirus 2 NEGATIVE NEGATIVE Final    Comment: (NOTE) SARS-CoV-2 target nucleic acids are NOT DETECTED. The SARS-CoV-2 RNA is generally detectable in upper and lower respiratory specimens during the acute phase of infection. The lowest concentration of SARS-CoV-2 viral copies this assay can detect is 250 copies / mL. A negative result does not preclude SARS-CoV-2 infection and should not be used as the sole basis for treatment or other patient management decisions.  A negative result may occur with improper specimen collection / handling, submission of specimen other than nasopharyngeal swab, presence of viral mutation(s) within the areas targeted by this assay, and inadequate number of viral copies (<250 copies / mL). A negative result must be combined with clinical observations, patient history, and epidemiological information. Fact Sheet for Patients:   StrictlyIdeas.no Fact Sheet  for Healthcare Providers: BankingDealers.co.za This test is not yet approved or cleared  by the [place] FDA and has been authorized for detection and/or diagnosis of SARS-CoV-2 by FDA under an Emergency Use Authorization (EUA).  This EUA will remain in effect (meaning this test can be used) for the duration of the COVID-19 declaration under Section 564(b)(1) of the Act, 21 U.S.C. section 360bbb-3(b)(1), unless the authorization is terminated or revoked sooner. Performed at [facility], [place] [address], [place], [place] [address]   Surgical PCR screen     Status: None   Collection Time: 11/04/19  8:50 AM   Specimen: Nasal Mucosa; Nasal Swab  Result Value Ref Range Status   MRSA, PCR NEGATIVE NEGATIVE Final   Staphylococcus aureus NEGATIVE NEGATIVE  Final    Comment: (NOTE) The Xpert SA Assay (FDA approved for NASAL specimens in patients 53 years of age and older), is one component of a comprehensive surveillance program. It is not intended to diagnose infection nor to guide or monitor treatment. Performed at [facility], [place] [address] [place], [place] [address]          Radiology Studies: No results found.      Scheduled Meds: . vitamin C  1,000 mg Oral QHS  . celecoxib  200 mg Oral BID  . Chlorhexidine Gluconate Cloth  6 each Topical Daily  . docusate sodium  100 mg Oral BID  . feeding supplement (ENSURE ENLIVE)  237 mL Oral BID BM  . ferrous sulfate  325 mg Oral TID PC  . insulin aspart  0-9 Units Subcutaneous TID WC  . levothyroxine  88 mcg Oral QAC breakfast  . multivitamin with minerals  1 tablet Oral Daily  . pravastatin  40 mg Oral Daily  . rivaroxaban  10 mg Oral Daily   Continuous Infusions: . methocarbamol (ROBAXIN) IV       LOS: 5 days     Georgette Shell, MD  11/08/2019, 10:21 AM

## 2019-11-08 NOTE — Progress Notes (Signed)
Patient ID: [name], female   DOB: [birth year], 79 y.o.   MRN: [id number] Subjective: 4 Days Post-Op Procedure(s) (LRB): TOTAL HIP ARTHROPLASTY ANTERIOR APPROACH (Right)    Patient reports pain as mild with regards to her right hip.  No events Medical concerns and notes reviewed  Objective:   VITALS:   Vitals:   11/07/19 2208 11/08/19 0602  BP: 125/78 (!) 122/56  Pulse: 89 83  Resp: 17 16  Temp: 98.2 F (36.8 C) 98 F (36.7 C)  SpO2:      Neurovascular intact Incision: dressing C/D/I  LABS Recent Labs    11/06/19 0332 11/07/19 0318 11/08/19 0439  HGB 8.2* 7.4* 8.6*  HCT 26.8* 24.1* 28.8*  WBC 8.2 8.4 7.9  PLT 247 265 281    Recent Labs    11/06/19 0332 11/07/19 0318  NA 138 138  K 3.4* 4.7  BUN 16 13  CREATININE 0.50 0.54  GLUCOSE 128* 118*    No results for input(s): LABPT, INR in the last 72 hours.   Assessment/Plan: 4 Days Post-Op Procedure(s) (LRB): TOTAL HIP ARTHROPLASTY ANTERIOR APPROACH (Right)   Advance diet Up with therapy   Looks as if she received a single unit of blood for peri-operative acute on chronic blood loss anemia No events WBAT  RLE Aspirin for DVT prophylaxis RTC in 2 weeks  Contact me [phone]) with any further questions Signing off for now as medical team working on medical co-morbidity issues

## 2019-11-08 NOTE — Plan of Care (Signed)
  Problem: Health Behavior/Discharge Planning: Goal: Ability to manage health-related needs will improve Outcome: Progressing   Problem: Clinical Measurements: Goal: Ability to maintain clinical measurements within normal limits will improve Outcome: Progressing Goal: Will remain free from infection Outcome: Progressing Goal: Diagnostic test results will improve Outcome: Progressing Goal: Respiratory complications will improve Outcome: Progressing Goal: Cardiovascular complication will be avoided Outcome: Progressing   Problem: Activity: Goal: Risk for activity intolerance will decrease Outcome: Progressing   Problem: Nutrition: Goal: Adequate nutrition will be maintained Outcome: Progressing   Problem: Coping: Goal: Level of anxiety will decrease Outcome: Progressing   Problem: Elimination: Goal: Will not experience complications related to bowel motility Outcome: Progressing Goal: Will not experience complications related to urinary retention Outcome: Progressing   Problem: Pain Managment: Goal: General experience of comfort will improve Outcome: Progressing   Problem: Safety: Goal: Ability to remain free from injury will improve Outcome: Progressing   Problem: Skin Integrity: Goal: Risk for impaired skin integrity will decrease Outcome: Progressing   Problem: Education: Goal: Verbalization of understanding the information provided (i.e., activity precautions, restrictions, etc) will improve Outcome: Progressing

## 2019-11-09 LAB — GLUCOSE, CAPILLARY
Glucose-Capillary: 137 mg/dL — ABNORMAL HIGH (ref 70–99)
Glucose-Capillary: 123 mg/dL — ABNORMAL HIGH (ref 70–99)
Glucose-Capillary: 165 mg/dL — ABNORMAL HIGH (ref 70–99)
Glucose-Capillary: 149 mg/dL — ABNORMAL HIGH (ref 70–99)

## 2019-11-09 LAB — CBC
HCT: 27.8 % — ABNORMAL LOW (ref 36.0–46.0)
Hemoglobin: 8.7 g/dL — ABNORMAL LOW (ref 12.0–15.0)
MCH: 27.3 pg (ref 26.0–34.0)
MCHC: 31.3 g/dL (ref 30.0–36.0)
MCV: 87.1 fL (ref 80.0–100.0)
Platelets: 241 10*3/uL (ref 150–400)
RBC: 3.19 MIL/uL — ABNORMAL LOW (ref 3.87–5.11)
RDW: 17.1 % — ABNORMAL HIGH (ref 11.5–15.5)
WBC: 8.9 10*3/uL (ref 4.0–10.5)
nRBC: 0 % (ref 0.0–0.2)

## 2019-11-09 MED ORDER — DIPHENHYDRAMINE HCL 25 MG PO CAPS
25.0000 mg | ORAL_CAPSULE | Freq: Every evening | ORAL | Status: DC | PRN
  Administered 2019-11-10 – 2019-11-12 (×2): 25 mg via ORAL
  Filled 2019-11-09 (×2): qty 1

## 2019-11-09 NOTE — Progress Notes (Signed)
Physical Therapy Treatment Patient Details Name: [name] MRN: [id number] DOB: 1941-03-31 Today's Date: 11/09/2019    History of Present Illness R hip fx, s/p AA-THA on 11/04/19; PMH of DM, interstitial lung disease (uses 2L O2 prn at baseline)    PT Comments    POD # 5 General bed mobility comments: "don't touch me.  I can do it by myself" pt was able to self move R LE off and back onto bed with increased time.General transfer comment: "just hold my walker" pt declining any physical asssitance from therapist despite instruction on safety.  Pt was also able to self lower and rise from elevated toilet and able to self perform peri care in standing at Supervision.  "Well are you going to stand there or  help me?'  At stand by assist, pt was able to don/doff underpants as well.  Good standing balance.  General Gait Details: First assisted with amb to bathroom at [place] assist as pt declined any physical assist from therapist.  Second amb in hallway afetr a seated rest break on EOB as pt requested oxygen.  "I know when I can't breath".  Dynamat reading was 97%.  "Just give me my oxygen I don't care what that machine says".  Reappled 2 lts nasal and required approx 3 min seated rest break before assisting back to bed. Pt was displeased that Therapist did not walk her with oxygen and did not have the recliner following her.    Follow Up Recommendations  SNF;Supervision/Assistance - 24 hour     Equipment Recommendations  Rolling walker with 5" wheels;3in1 (PT)    Recommendations for Other Services       Precautions / Restrictions Precautions Precautions: Fall Restrictions Weight Bearing Restrictions: No RLE Weight Bearing: Weight bearing as tolerated    Mobility  Bed Mobility Overal bed mobility: Needs Assistance Bed Mobility: Supine to Sit;Sit to Supine     Supine to sit: Supervision Sit to supine: Supervision   General bed mobility comments: "don't touch me.  I can  do it by myself" pt was able to self move R LE off and back onto bed with increased time.  Transfers Overall transfer level: Needs assistance Equipment used: Rolling walker (2 wheeled) Transfers: Sit to/from Stand Sit to Stand: Supervision         General transfer comment: "just hold my walker" pt declining any physical asssitance from therapist despite instruction on safety.  Pt was also able to self lower and rise from elevated toilet and able to self perform peri care in standing at Supervision.  "Well are you going to stand there or  help me?'  At stand by assist, pt was able to don/doff underpants as well.  Good standing balance.  Ambulation/Gait Ambulation/Gait assistance: Supervision Gait Distance (Feet): 45 Feet(11 feet x 2) Assistive device: Rolling walker (2 wheeled) Gait Pattern/deviations: Step-to pattern;Decreased stance time - right Gait velocity: decreased   General Gait Details: First assisted with amb to bathroom at [place] assist as pt declined any physical assist from therapist.  Second amb in hallway afetr a seated rest break on EOB as pt requested oxygen.  "I know when I can't breath".  Dynamat reading was 97%.  "Just give me my oxygen I don't care what that machine reads".  Reappled 2 lts nasal and required approx 3 min seated rest break before assisting back to bed.   Stairs             Wheelchair  Mobility    Modified Rankin (Stroke Patients Only)       Balance                                            Cognition Arousal/Alertness: Awake/alert Behavior During Therapy: WFL for tasks assessed/performed Overall Cognitive Status: Within Functional Limits for tasks assessed                                 General Comments: AxO x 3      Exercises      General Comments        Pertinent Vitals/Pain Pain Assessment: 0-10 Pain Score: 4  Pain Location: R hip with ambulation Pain Descriptors /  Indicators: Discomfort;Operative site guarding Pain Intervention(s): Monitored during session;Premedicated before session;Repositioned    Home Living                      Prior Function            PT Goals (current goals can now be found in the care plan section)      Frequency    Min 5X/week      PT Plan Current plan remains appropriate    Co-evaluation              AM-PAC PT "6 Clicks" Mobility   Outcome Measure  Help needed turning from your back to your side while in a flat bed without using bedrails?: None Help needed moving from lying on your back to sitting on the side of a flat bed without using bedrails?: None Help needed moving to and from a bed to a chair (including a wheelchair)?: None Help needed standing up from a chair using your arms (e.g., wheelchair or bedside chair)?: None Help needed to walk in hospital room?: None Help needed climbing 3-5 steps with a railing? : A Little 6 Click Score: 23    End of Session Equipment Utilized During Treatment: Gait belt Activity Tolerance: Patient tolerated treatment well Patient left: in bed;with call bell/phone within reach Nurse Communication: Mobility status PT Visit Diagnosis: Difficulty in walking, not elsewhere classified (R26.2);History of falling (Z91.81)     Time: 1050-1120 PT Time Calculation (min) (ACUTE ONLY): 30 min  Charges:  $Gait Training: 8-22 mins $Therapeutic Activity: 8-22 mins                     Rica Koyanagi  PTA Acute  Rehabilitation Services Pager      [phone] Office      [phone]

## 2019-11-09 NOTE — Progress Notes (Signed)
PROGRESS NOTE    [name] Harsha  BJS:[id number] DOB: 1940/11/02 DOA: 11/03/2019 PCP: [name], MD   Chef Complaints: Fall with right hip pain.  Brief Narrative: 79 year old female with significant complex medical history of chronic interstitial lung disease, hypothyroidism, type 2 diabetes mellitus, mediastinal adenopathy, lung mass presented with right hip pain after a fall while getting out of the chair and landing on her right hip on 11/03/2019. In the ED Covid negative, chest x-ray with mild atelectasis with infiltrate within the right lung base and peripheral right upper lobe new mild patchy opacity, and acute fracture of the proximal right femur and underwent right hip replacement by Dr. Alvan Dame on 5/26  Subjective:  Alert awake oriented not in acute distress. Son at the bedside arrived during my exam Denies chest pain nausea vomiting shortness of breath.  Assessment & Plan:  Fracture of femoral neck,right:Secondary to fall, s/p right hip replacement by Dr. Truman Hayward on 5/26, continue pain control narcotics, nausea medication bowel regimen with Colace, DVT prophylaxis with Xarelto, as per orthopedics.  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  Echo showed EF normal grade 1 diastolic dysfunction and moderately elevated pulmonary artery systolic pressure, followed by pulmonology Dr. Jone Baseman discussed biopsy at that time patient wanted to defer due to patient's husband's recent death, patient family wanted biopsy after discussion this hospital stay apparently "Patient/familydiscussed with Dr. [name] on 5/28. Would recommend outpatient PET scan if patient able to be discharged over weekend, if patient is still in hospital on Tuesday wouldrecommend inpatient pulmonary consultation for consideration of biopsy, patient/family agreeable with  plan". I notified pulmonary consult pager service and discussed- advised to consult [facility] PULM team in am   Chronic interstitial lung disease/chronic hypoxic respiratory failure uses 2 L nasal cannula intermittently at home per family.  Deconditioning/fall continue PT OT will need a skilled nursing facility.  Hypothyroidism TSH normal.  Continue Synthroid  Type 2 diabetes mellitus with hyperlipidemia: Holding home pioglitazone. A1c 6.2 on 5/26.  Blood sugar is controlled.  Continue sliding scale. Recent Labs  Lab 11/08/19 1159 11/08/19 1711 11/08/19 2133 11/09/19 0754 11/09/19 1155  GLUCAP 138* 137* 113* 137* 123*   HyperLipidemia continue pravastatin  Anemia from acute blood loss in the setting of  Hip surgery- FOBT negative. status post 1 unitPRBC HB improved in 8.7 g range and stable.  Sacral bump x-ray was unremarkable.  DVT prophylaxis:DOAC Code Status:FULL Family Communication: plan of care discussed with patient and her son at bedside.  Status is: Inpatient Remains inpatient appropriate because:Unsafe d/c plan, Inpatient level of care appropriate due to severity of illness and Looking into skilled nursing facility, awaiting pulmonary eval.  Dispo: The patient is from: Home             Anticipated d/c is to: SNF             Anticipated d/c date is: 2 days.             Patient currently is not medically stable to d/c. Nutrition: Diet Order            Diet Carb Modified Fluid consistency: Thin; Room service appropriate? Yes  Diet effective now              Nutrition Problem: Increased nutrient needs Etiology: hip fracture, post-op healing Signs/Symptoms: estimated needs Interventions: Ensure Enlive (each supplement provides 350kcal and  20 grams of protein), MVI Body mass index is 25.16 kg/m. Pressure Ulcer:    Consultants:see note  Procedures:see note Microbiology:see note  Medications: Scheduled Meds:  vitamin C  1,000 mg Oral QHS   celecoxib  200 mg  Oral BID   Chlorhexidine Gluconate Cloth  6 each Topical Daily   docusate sodium  100 mg Oral BID   feeding supplement (ENSURE ENLIVE)  237 mL Oral BID BM   ferrous sulfate  325 mg Oral TID PC   insulin aspart  0-9 Units Subcutaneous TID WC   levothyroxine  88 mcg Oral QAC breakfast   magic mouthwash  5 mL Oral TID   multivitamin with minerals  1 tablet Oral Daily   pravastatin  40 mg Oral Daily   rivaroxaban  10 mg Oral Daily   Continuous Infusions:  methocarbamol (ROBAXIN) IV      Antimicrobials: Anti-infectives (From admission, onward)   Start     Dose/Rate Route Frequency Ordered Stop   11/04/19 1800  ceFAZolin (ANCEF) IVPB 2g/100 mL premix     2 g 200 mL/hr over 30 Minutes Intravenous Every 6 hours 11/04/19 1515 11/05/19 0059   11/04/19 1115  ceFAZolin (ANCEF) IVPB 2g/100 mL premix     2 g 200 mL/hr over 30 Minutes Intravenous On call to O.R. 11/04/19 1106 11/04/19 1158       Objective: Vitals: Today's Vitals   11/08/19 1402 11/08/19 1600 11/08/19 1800 11/08/19 2136  BP: 120/68   (!) 124/58  Pulse: 86   84  Resp: (!) 22   18  Temp: 98.4 F (36.9 C)   99 F (37.2 C)  TempSrc: Oral   Oral  SpO2: 97%   98%  Weight:      Height:      PainSc:  0-No pain 0-No pain 0-No pain    Intake/Output Summary (Last 24 hours) at 11/09/2019 1014 Last data filed at 11/08/2019 2200 Gross per 24 hour  Intake 300 ml  Output 350 ml  Net -50 ml   Filed Weights   11/03/19 2111 11/04/19 1124  Weight: 70.7 kg 70.7 kg   Weight change:    Intake/Output from previous day: 05/30 0701 - 05/31 0700 In: 300 [P.O.:300] Out: 850 [Urine:850] Intake/Output this shift: No intake/output data recorded.  Examination:  General exam: AAOx3,NAD,weak appearing. HEENT:Oral mucosa moist, Ear/Nose WNL grossly,dentition normal. Respiratory system: crackles on lungs,no use of accessory muscle, non tender. Cardiovascular system: S1 & S2 +, regular, No JVD. Gastrointestinal system:  Abdomen soft, NT,ND, BS+. Nervous System:Alert,awake, moving extremities and grossly nonfocal Extremities: mild leg edema, distal peripheral pulses palpable, Rt hip Bruise+ dressing intact.  Skin: No rashes,no icterus. MSK: Normal muscle bulk,tone, power  Data Reviewed: I have personally reviewed following labs and imaging studies CBC: Recent Labs  Lab 11/03/19 1613 11/04/19 0313 11/05/19 0352 11/06/19 0332 11/07/19 0318 11/08/19 0439 11/09/19 0809  WBC 9.0   < > 8.0 8.2 8.4 7.9 8.9  NEUTROABS 7.4  --   --   --   --   --   --   HGB 10.0*   < > 9.5* 8.2* 7.4* 8.6* 8.7*  HCT 32.2*   < > 31.2* 26.8* 24.1* 28.8* 27.8*  MCV 88.7   < > 90.7 90.2 90.3 87.8 87.1  PLT 313   < > 204 247 265 281 241   < > = values in this interval not displayed.   Basic Metabolic Panel: Recent Labs  Lab 11/04/19 0313 11/05/19 0352  11/06/19 0332 11/07/19 0318 11/08/19 0439  NA 134* 135 138 138 137  K 3.7 4.3 3.4* 4.7 3.8  CL 102 101 102 104 103  CO2 25 27 28 28 29   GLUCOSE 186* 190* 128* 118* 134*  BUN 11 11 16 13 11   CREATININE 0.59 0.58 0.50 0.54 0.39*  CALCIUM 8.5* 9.2 8.7* 8.5* 8.6*  MG  --   --  1.7  --   --    GFR: Estimated Creatinine Clearance: 54.3 mL/min (A) (by C-G formula based on SCr of 0.39 mg/dL (L)). Liver Function Tests: Recent Labs  Lab 11/03/19 1613  AST 22  ALT 14  ALKPHOS 79  BILITOT 0.4  PROT 7.8  ALBUMIN 2.9*   No results for input(s): LIPASE, AMYLASE in the last 168 hours. No results for input(s): AMMONIA in the last 168 hours. Coagulation Profile: Recent Labs  Lab 11/03/19 2126  INR 1.1   Cardiac Enzymes: Recent Labs  Lab 11/03/19 1613  CKTOTAL 147   BNP (last 3 results) No results for input(s): PROBNP in the last 8760 hours. HbA1C: No results for input(s): HGBA1C in the last 72 hours. CBG: Recent Labs  Lab 11/08/19 0736 11/08/19 1159 11/08/19 1711 11/08/19 2133 11/09/19 0754  GLUCAP 147* 138* 137* 113* 137*   Lipid Profile: No results  for input(s): CHOL, HDL, LDLCALC, TRIG, CHOLHDL, LDLDIRECT in the last 72 hours. Thyroid Function Tests: No results for input(s): TSH, T4TOTAL, FREET4, T3FREE, THYROIDAB in the last 72 hours. Anemia Panel: No results for input(s): VITAMINB12, FOLATE, FERRITIN, TIBC, IRON, RETICCTPCT in the last 72 hours. Sepsis Labs: No results for input(s): PROCALCITON, LATICACIDVEN in the last 168 hours.  Recent Results (from the past 240 hour(s))  SARS Coronavirus 2 by RT PCR (hospital order, performed in [facility] hospital lab) Nasopharyngeal Nasopharyngeal Swab     Status: None   Collection Time: 11/03/19  7:30 PM   Specimen: Nasopharyngeal Swab  Result Value Ref Range Status   SARS Coronavirus 2 NEGATIVE NEGATIVE Final    Comment: (NOTE) SARS-CoV-2 target nucleic acids are NOT DETECTED. The SARS-CoV-2 RNA is generally detectable in upper and lower respiratory specimens during the acute phase of infection. The lowest concentration of SARS-CoV-2 viral copies this assay can detect is 250 copies / mL. A negative result does not preclude SARS-CoV-2 infection and should not be used as the sole basis for treatment or other patient management decisions.  A negative result may occur with improper specimen collection / handling, submission of specimen other than nasopharyngeal swab, presence of viral mutation(s) within the areas targeted by this assay, and inadequate number of viral copies (<250 copies / mL). A negative result must be combined with clinical observations, patient history, and epidemiological information. Fact Sheet for Patients:   StrictlyIdeas.no Fact Sheet for Healthcare Providers: BankingDealers.co.za This test is not yet approved or cleared  by the [place] FDA and has been authorized for detection and/or diagnosis of SARS-CoV-2 by FDA under an Emergency Use Authorization (EUA).  This EUA will remain in effect (meaning this test  can be used) for the duration of the COVID-19 declaration under Section 564(b)(1) of the Act, 21 U.S.C. section 360bbb-3(b)(1), unless the authorization is terminated or revoked sooner. Performed at [facility], [place] [address] [place], [place] [address]   Surgical PCR screen     Status: None   Collection Time: 11/04/19  8:50 AM   Specimen: Nasal Mucosa; Nasal Swab  Result Value Ref Range Status  MRSA, PCR NEGATIVE NEGATIVE Final   Staphylococcus aureus NEGATIVE NEGATIVE Final    Comment: (NOTE) The Xpert SA Assay (FDA approved for NASAL specimens in patients 17 years of age and older), is one component of a comprehensive surveillance program. It is not intended to diagnose infection nor to guide or monitor treatment. Performed at [facility], [place] [address], [place], [place] [address]     Radiology Studies: DG Sacrum/Coccyx  Result Date: 11/08/2019 CLINICAL DATA:  Mass on right buttocks. EXAM: SACRUM AND COCCYX - 2+ VIEW COMPARISON:  None. FINDINGS: Patient is status post right hip replacement. Visualized portions of the hardware in good position. No bony abnormalities. No fractures. IMPRESSION: No acute bony abnormalities.  Soft tissues are grossly unremarkable. Electronically Signed   By: Dorise Bullion III M.D   On: 11/08/2019 14:36     LOS: 6 days   Antonieta Pert, MD [facility]  11/09/2019, 10:14 AM

## 2019-11-09 NOTE — Plan of Care (Signed)
  Problem: Health Behavior/Discharge Planning: Goal: Ability to manage health-related needs will improve Outcome: Progressing   Problem: Clinical Measurements: Goal: Ability to maintain clinical measurements within normal limits will improve Outcome: Progressing Goal: Will remain free from infection Outcome: Progressing Goal: Diagnostic test results will improve Outcome: Progressing Goal: Respiratory complications will improve Outcome: Progressing Goal: Cardiovascular complication will be avoided Outcome: Progressing   Problem: Activity: Goal: Risk for activity intolerance will decrease Outcome: Progressing   Problem: Nutrition: Goal: Adequate nutrition will be maintained Outcome: Progressing   Problem: Elimination: Goal: Will not experience complications related to bowel motility Outcome: Progressing Goal: Will not experience complications related to urinary retention Outcome: Progressing   Problem: Coping: Goal: Level of anxiety will decrease Outcome: Progressing   Problem: Pain Managment: Goal: General experience of comfort will improve Outcome: Progressing   Problem: Safety: Goal: Ability to remain free from injury will improve Outcome: Progressing   Problem: Skin Integrity: Goal: Risk for impaired skin integrity will decrease Outcome: Progressing   Problem: Education: Goal: Verbalization of understanding the information provided (i.e., activity precautions, restrictions, etc) will improve Outcome: Progressing   Problem: Activity: Goal: Ability to ambulate and perform ADLs will improve Outcome: Progressing   Problem: Clinical Measurements: Goal: Postoperative complications will be avoided or minimized Outcome: Progressing   Problem: Self-Concept: Goal: Ability to maintain and perform role responsibilities to the fullest extent possible will improve Outcome: Progressing   Problem: Pain Management: Goal: Pain level will decrease Outcome: Progressing

## 2019-11-09 NOTE — TOC Progression Note (Signed)
Transition of Care [facility]) - Progression Note    Patient Details  Name: [name] MRN: [id number] Date of Birth: 27-Mar-1941  Transition of Care [facility]) CM/SW [place], [place] Phone Number: 11/09/2019, 10:46 AM  Clinical Narrative:    CSW provided list of Medicare.gov bed offers to the patient and her son at bedside. CSW answered questions about SNF placement process. CSW will follow up with patient for SNF choice.    Expected Discharge Plan: Skilled Nursing Facility Barriers to Discharge: Continued Medical Work up  Expected Discharge Plan and Services Expected Discharge Plan: [place]   Discharge Planning Services: CM Consult                                           Social Determinants of Health (SDOH) Interventions    Readmission Risk Interventions No flowsheet data found.

## 2019-11-09 NOTE — Plan of Care (Signed)
  Problem: Health Behavior/Discharge Planning: Goal: Ability to manage health-related needs will improve Outcome: Progressing   Problem: Clinical Measurements: Goal: Ability to maintain clinical measurements within normal limits will improve Outcome: Progressing Goal: Will remain free from infection Outcome: Progressing Goal: Diagnostic test results will improve Outcome: Progressing Goal: Respiratory complications will improve Outcome: Progressing Goal: Cardiovascular complication will be avoided Outcome: Progressing   Problem: Activity: Goal: Risk for activity intolerance will decrease Outcome: Progressing   Problem: Nutrition: Goal: Adequate nutrition will be maintained Outcome: Progressing   Problem: Coping: Goal: Level of anxiety will decrease Outcome: Progressing   Problem: Elimination: Goal: Will not experience complications related to bowel motility Outcome: Progressing Goal: Will not experience complications related to urinary retention Outcome: Progressing   Problem: Pain Managment: Goal: General experience of comfort will improve Outcome: Progressing   Problem: Safety: Goal: Ability to remain free from injury will improve Outcome: Progressing   Problem: Skin Integrity: Goal: Risk for impaired skin integrity will decrease Outcome: Progressing   Problem: Education: Goal: Verbalization of understanding the information provided (i.e., activity precautions, restrictions, etc) will improve Outcome: Progressing   Problem: Activity: Goal: Ability to ambulate and perform ADLs will improve Outcome: Progressing   Problem: Clinical Measurements: Goal: Postoperative complications will be avoided or minimized Outcome: Progressing   Problem: Self-Concept: Goal: Ability to maintain and perform role responsibilities to the fullest extent possible will improve Outcome: Progressing   Problem: Pain Management: Goal: Pain level will decrease Outcome: Progressing

## 2019-11-09 NOTE — Progress Notes (Signed)
Notified Dr. Maren Beach- attending to this patient that she requests something prn to help her sleep at night, she has not been resting well. Awaiting new orders.

## 2019-11-10 ENCOUNTER — Other Ambulatory Visit: Payer: Self-pay | Admitting: Internal Medicine

## 2019-11-10 DIAGNOSIS — R918 Other nonspecific abnormal finding of lung field: Secondary | ICD-10-CM

## 2019-11-10 LAB — BASIC METABOLIC PANEL
Anion gap: 9 (ref 5–15)
BUN: 8 mg/dL (ref 8–23)
CO2: 27 mmol/L (ref 22–32)
Calcium: 8.8 mg/dL — ABNORMAL LOW (ref 8.9–10.3)
Chloride: 102 mmol/L (ref 98–111)
Creatinine, Ser: 0.57 mg/dL (ref 0.44–1.00)
GFR calc Af Amer: >60 mL/min (ref >60)
GFR calc non Af Amer: >60 mL/min (ref >60)
Glucose, Bld: 142 mg/dL — ABNORMAL HIGH (ref 70–99)
Potassium: 4.2 mmol/L (ref 3.5–5.1)
Sodium: 138 mmol/L (ref 135–145)

## 2019-11-10 LAB — CBC
HCT: 28.1 % — ABNORMAL LOW (ref 36.0–46.0)
Hemoglobin: 8.7 g/dL — ABNORMAL LOW (ref 12.0–15.0)
MCH: 26.8 pg (ref 26.0–34.0)
MCHC: 31 g/dL (ref 30.0–36.0)
MCV: 86.5 fL (ref 80.0–100.0)
Platelets: 277 10*3/uL (ref 150–400)
RBC: 3.25 MIL/uL — ABNORMAL LOW (ref 3.87–5.11)
RDW: 17.2 % — ABNORMAL HIGH (ref 11.5–15.5)
WBC: 10 10*3/uL (ref 4.0–10.5)
nRBC: 0 % (ref 0.0–0.2)

## 2019-11-10 LAB — GLUCOSE, CAPILLARY
Glucose-Capillary: 132 mg/dL — ABNORMAL HIGH (ref 70–99)
Glucose-Capillary: 110 mg/dL — ABNORMAL HIGH (ref 70–99)
Glucose-Capillary: 96 mg/dL (ref 70–99)
Glucose-Capillary: 136 mg/dL — ABNORMAL HIGH (ref 70–99)

## 2019-11-10 NOTE — Progress Notes (Signed)
PCCM Progress note  Patient has requested that Dr. Roxan Hockey be consulted for treatment of the pulmonary mass as well, as she saw him in consultation originally. I have contacted his office in collaboration with the primary team to see if he can see the patient during this admission.    If the patient wishes to proceeded Dr. Lamonte Sakai, with PCCM, can preform bronchoscopy with biopsy morning of 6/4. Xarelto will need to be placed on hold until final decision has been made.   Johnsie Cancel, NP-C [place] [facility] Contact / Pager information can be found on Amion  11/10/2019, 4:20 PM

## 2019-11-10 NOTE — Progress Notes (Addendum)
NAME:  [name], MRN:  [id number], DOB:  June 29, 1940, LOS: 7 ADMISSION DATE:  11/03/2019, CONSULTATION DATE:  11/06/2019 REFERRING MD:  Antonieta Pert, CHIEF COMPLAINT:  Lung mass    Brief History   79yo presented to ED with complaint of right hip pain post fall with diagnosis of right femoral neck fracture for which orthopedic surgery was consulted. Underwent total hip arthroplasty 5/26. PCCM consulted for management of left lung mass.   History of present illness   [name] is 69 who presented to ED with right hip pain post mechanical fall and was found to have acute femoral neck fracture for which he underwent total hip arthroplasty 5/26.    Additional work-up on admission including CT scan chest revealed 7.1 x 5.9 mediastinal adenopathy.  Per chart review and patient this is a known diagnosis. Per chart review it appears patient has consulted with both  Dr. Roxan Hockey and our pulmonary team including Dr. Valeta Harms regarding biopsy and surgical approaches. Patient states she deferred any further management or work-up including biopsy as she was actively caring for her husband who recently passed away from cancer. Most recent PET scan 06/05/2018.   On evaluation today patient and family request in patient workup of this mass as "I have already pushed this a year, I'd like to get it taken care of now".    Past Medical History  Thyroid disease  ILD IBS Hyperplastic colon polyp Diabetes  Bronchiectasis   Significant Hospital Events   Admitted 5/25  Consults:  Ortho  TRH  Procedures:  Total hip arthroplasty 5/26  Significant Diagnostic Tests:  CT chest 5/26 > 1. Interval development of a 7.1 x 5.9 cm left upper lobe masslike opacity extending into the left hilum. While this could possibly represent pneumonia, imaging features are concerning for neoplasm. 2. Interval development of mediastinal lymphadenopathy. Metastatic disease not excluded. 3. Underlying chronic  interstitial/fibrotic lung disease. 4. Aortic Atherosclerosis (ICD10-I70.0).  Micro Data:  COVID 5/25  Antimicrobials:     Interim history/subjective:  Sitting up in bed with no acute complaints states she feels well but is questioning workup for lung mass.   Objective   Blood pressure 108/67, pulse 84, temperature 98.8 F (37.1 C), temperature source Oral, resp. rate (!) 21, height 5\' 6"  (1.676 m), weight 70.7 kg, SpO2 96 %.        Intake/Output Summary (Last 24 hours) at 11/10/2019 1337 Last data filed at 11/10/2019 0600 Gross per 24 hour  Intake 820 ml  Output 200 ml  Net 620 ml   Filed Weights   11/03/19 2111 11/04/19 1124  Weight: 70.7 kg 70.7 kg    Examination: General: Chronically ill appearing elderly female sitting up in bed in NAD HEENT: [place]/AT, MM pink/moist, PERRL, sclera non-icteric   Neuro: Alert and oriented x3, non*focal  CV: s1s2 regular rate and rhythm, no murmur, rubs, or gallops,  PULM:  Faint expiratory wheeze, no increased work of breathing, currently on RA GI: soft, bowel sounds active in all 4 quadrants, non-tender, non-distended Extremities: warm/dry, no edema  Skin: no rashes or lesions  Resolved Hospital Problem list     Assessment & Plan:  Left lung mass Hx of ILD Hx of bronchiectasis -CT scan chest on this admission revealed enlarging, now 7.1 x 5.9,  mediastinal adenopathy P: On evaluation today 6/1 patient and family report desire for inpatient work-up of pulmonary mass  Will plan for bronchoscopy 6/4 Hold PO Xarelto  Patient and family aware of plan  and agree  NPO night before procedure   Best practice:  Diet: Carb modified Pain/Anxiety/Delirium protocol (if indicated): As needed VAP protocol (if indicated): Not applicable DVT prophylaxis: Xarelto  GI prophylaxis: PPI Glucose control: SSI Mobility: Up with assistance  Code Status: Full Family Communication: Per primary  Disposition: Floor   Labs   CBC: Recent Labs  Lab  11/03/19 1613 11/04/19 0313 11/06/19 0332 11/07/19 0318 11/08/19 0439 11/09/19 0809 11/10/19 0353  WBC 9.0   < > 8.2 8.4 7.9 8.9 10.0  NEUTROABS 7.4  --   --   --   --   --   --   HGB 10.0*   < > 8.2* 7.4* 8.6* 8.7* 8.7*  HCT 32.2*   < > 26.8* 24.1* 28.8* 27.8* 28.1*  MCV 88.7   < > 90.2 90.3 87.8 87.1 86.5  PLT 313   < > 247 265 281 241 277   < > = values in this interval not displayed.    Basic Metabolic Panel: Recent Labs  Lab 11/05/19 0352 11/06/19 0332 11/07/19 0318 11/08/19 0439 11/10/19 0353  NA 135 138 138 137 138  K 4.3 3.4* 4.7 3.8 4.2  CL 101 102 104 103 102  CO2 27 28 28 29 27   GLUCOSE 190* 128* 118* 134* 142*  BUN 11 16 13 11 8   CREATININE 0.58 0.50 0.54 0.39* 0.57  CALCIUM 9.2 8.7* 8.5* 8.6* 8.8*  MG  --  1.7  --   --   --    GFR: Estimated Creatinine Clearance: 54.3 mL/min (by C-G formula based on SCr of 0.57 mg/dL). Recent Labs  Lab 11/07/19 0318 11/08/19 0439 11/09/19 0809 11/10/19 0353  WBC 8.4 7.9 8.9 10.0    Liver Function Tests: Recent Labs  Lab 11/03/19 1613  AST 22  ALT 14  ALKPHOS 79  BILITOT 0.4  PROT 7.8  ALBUMIN 2.9*   No results for input(s): LIPASE, AMYLASE in the last 168 hours. No results for input(s): AMMONIA in the last 168 hours.  ABG    Component Value Date/Time   TCO2 29 05/13/2018 2117     Coagulation Profile: Recent Labs  Lab 11/03/19 2126  INR 1.1    Cardiac Enzymes: Recent Labs  Lab 11/03/19 1613  CKTOTAL 147    HbA1C: Hgb A1c MFr Bld  Date/Time Value Ref Range Status  11/04/2019 03:13 AM 6.2 (H) 4.8 - 5.6 % Final    Comment:    (NOTE) Pre diabetes:          5.7%-6.4% Diabetes:              >6.4% Glycemic control for   <7.0% adults with diabetes     CBG: Recent Labs  Lab 11/09/19 1155 11/09/19 1659 11/09/19 2200 11/10/19 0728 11/10/19 1135  GLUCAP 123* 165* 149* 132* 110*      Signature:  Johnsie Cancel, NP-C [place] [facility] Contact / Pager information  can be found on Amion  11/10/2019, 2:09 PM   Attending Note:  I have examined patient, reviewed labs, studies and notes.   Good discussion with the patient and her family at bedside regarding her CT scans of the chest, the interval change in her left lung mass, now increased in size to 7.1 cm in its largest dimension.  She had initially deferred diagnostics in 2019 but is now willing to consider.  I discussed the pros and cons of bronchoscopy in detail with her today.  She go to  think about this and talk to family about this.  I will go ahead and schedule for 6/4 so that she can be off Xarelto.  We will revisit on 6/2.    Baltazar Apo, MD, PhD 11/10/2019, 5:37 PM [place] [facility] [phone] or if no answer [phone]

## 2019-11-10 NOTE — Plan of Care (Signed)
  Problem: Health Behavior/Discharge Planning: Goal: Ability to manage health-related needs will improve Outcome: Progressing   Problem: Clinical Measurements: Goal: Ability to maintain clinical measurements within normal limits will improve Outcome: Progressing Goal: Will remain free from infection Outcome: Progressing Goal: Diagnostic test results will improve Outcome: Progressing Goal: Respiratory complications will improve Outcome: Progressing Goal: Cardiovascular complication will be avoided Outcome: Progressing   Problem: Activity: Goal: Risk for activity intolerance will decrease Outcome: Progressing   Problem: Nutrition: Goal: Adequate nutrition will be maintained Outcome: Progressing   Problem: Coping: Goal: Level of anxiety will decrease Outcome: Progressing   Problem: Elimination: Goal: Will not experience complications related to bowel motility Outcome: Progressing Goal: Will not experience complications related to urinary retention Outcome: Progressing   Problem: Pain Managment: Goal: General experience of comfort will improve Outcome: Progressing   Problem: Safety: Goal: Ability to remain free from injury will improve Outcome: Progressing   Problem: Skin Integrity: Goal: Risk for impaired skin integrity will decrease Outcome: Progressing   Problem: Education: Goal: Verbalization of understanding the information provided (i.e., activity precautions, restrictions, etc) will improve Outcome: Progressing   Problem: Activity: Goal: Ability to ambulate and perform ADLs will improve Outcome: Progressing   Problem: Clinical Measurements: Goal: Postoperative complications will be avoided or minimized Outcome: Progressing   Problem: Self-Concept: Goal: Ability to maintain and perform role responsibilities to the fullest extent possible will improve Outcome: Progressing   Problem: Pain Management: Goal: Pain level will decrease Outcome: Progressing

## 2019-11-10 NOTE — TOC Progression Note (Signed)
Transition of Care [facility]) - Progression Note    Patient Details  Name: [name] MRN: [id number] Date of Birth: 09-02-1940  Transition of Care [facility]) CM/SW [place], [place] Phone Number: 11/10/2019, 10:34 AM  Clinical Narrative:    Patient chose [facility] SNF for short rehab. CSW answered the patient pending SNF questions.    Expected Discharge Plan: Skilled Nursing Facility Barriers to Discharge: Continued Medical Work up  Expected Discharge Plan and Services Expected Discharge Plan: [place]   Discharge Planning Services: CM Consult                                           Social Determinants of Health (SDOH) Interventions    Readmission Risk Interventions No flowsheet data found.

## 2019-11-10 NOTE — Plan of Care (Signed)
  Problem: Clinical Measurements: Goal: Respiratory complications will improve Outcome: Progressing   Problem: Clinical Measurements: Goal: Cardiovascular complication will be avoided Outcome: Progressing   Problem: Activity: Goal: Risk for activity intolerance will decrease Outcome: Progressing   Problem: Nutrition: Goal: Adequate nutrition will be maintained Outcome: Progressing   Problem: Elimination: Goal: Will not experience complications related to bowel motility Outcome: Progressing   Problem: Elimination: Goal: Will not experience complications related to urinary retention Outcome: Progressing   Problem: Safety: Goal: Ability to remain free from injury will improve Outcome: Progressing   Problem: Skin Integrity: Goal: Risk for impaired skin integrity will decrease Outcome: Progressing

## 2019-11-10 NOTE — Progress Notes (Signed)
PROGRESS NOTE    [name]  ZOX:[id number] DOB: [birth year] DOA: 11/03/2019 PCP: [name], MD   Chef Complaints: Right hip pain  Brief Narrative: 79 year old female with significant complex medical history of chronic interstitial lung disease, hypothyroidism, type 2 diabetes mellitus, mediastinal adenopathy, lung mass presented with right hip pain after a fall while getting out of the chair and landing on her right hip on 11/03/2019. In the ED Covid negative, chest x-ray with mild atelectasis with infiltrate within the right lung base and peripheral right upper lobe new mild patchy opacity, and acute fracture of the proximal right femur and underwent right hip replacement by Dr. Alvan Dame on 5/26  Subjective: Seen this morning son at the bedside.  Asking about pulmonary consultation timing. No pain.  Mild shortness of breath with ambulation No acute Events overnight. Assessment & Plan:  Fracture of right femoral neck secondary to fall status post right hip replacement  5/26, continue pain control-pain and pressure stable.  Continue stool softener, DVT prophylaxis with Xarelto as per orthopedic.  Continue PT OT awaiting for skilled nursing facility placement.    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  Echo showed EF normal grade 1 diastolic dysfunction and moderately elevated pulmonary artery systolic pressure, followed by pulmonology Dr. Jone Baseman discussed biopsy at that time patient wanted to defer due to patient's husband's recent death, patient family wanted biopsy after discussion this hospital stay.  "Patient/familydiscussed with Dr. [name] on 5/28. Would recommend outpatient PET scan if patient able to be discharged over weekend, if patient is still in hospital on Tuesday wouldrecommend inpatient pulmonary consultation for  consideration of biopsy, patient/family agreeable with plan". I consulted and notified pulmonary team Waldron Session this morning also discussed with Dr. [name] over the phone.  Chronic interstitial lung disease/chronic hypoxic respiratory failure: uses 2 L nasal cannula intermittently at home per family.  On room air.  Deconditioning/fall continue PT OT will need a skilled nursing facility.  Hypothyroidism TSH normal.  Continue Synthroid  T2DM with hyperlipidemia: Holding home pioglitazone. A1c 6.2 on 5/26.  Blood sugar is controlled.  Continue sliding scale. Recent Labs  Lab 11/09/19 1155 11/09/19 1659 11/09/19 2200 11/10/19 0728 11/10/19 1135  GLUCAP 123* 165* 149* 132* 110*   HyperLipidemia continue pravastatin  Anemia from acute blood loss in the setting of  Hip surgery- FOBT negative.  No obvious blood loss present.  Status post 1 unitPRBC HB improved in 8.7 g has been holding steady.  CBC in 1 week.  Sacral bump x-ray was unremarkable.  DVT prophylaxis:DOAC per orthopedic Code Status:FULL Family Communication: plan of care discussed with patient and her son at the bedside.  Answered all the questions.  Status is: Inpatient Remains inpatient appropriate because:Unsafe d/c plan, Inpatient level of care appropriate due to severity of illness and Looking into skilled nursing facility, awaiting pulmonary eval.  Dispo: The patient is from: Home             Anticipated d/c is to: SNF             Anticipated d/c date is: 1-2 days.             Patient currently is not medically stable to d/c. Nutrition: Diet Order            Diet Carb Modified Fluid consistency: Thin; Room service appropriate? Yes  Diet effective now  Nutrition Problem: Increased nutrient needs Etiology: hip fracture, post-op healing Signs/Symptoms: estimated needs Interventions: Ensure Enlive (each supplement provides 350kcal and 20 grams of protein), MVI Body mass index is 25.16  kg/m. Pressure Ulcer:    Consultants:see note  Procedures:see note Microbiology:see note  Medications: Scheduled Meds: . vitamin C  1,000 mg Oral QHS  . celecoxib  200 mg Oral BID  . Chlorhexidine Gluconate Cloth  6 each Topical Daily  . docusate sodium  100 mg Oral BID  . feeding supplement (ENSURE ENLIVE)  237 mL Oral BID BM  . ferrous sulfate  325 mg Oral TID PC  . insulin aspart  0-9 Units Subcutaneous TID WC  . levothyroxine  88 mcg Oral QAC breakfast  . magic mouthwash  5 mL Oral TID  . multivitamin with minerals  1 tablet Oral Daily  . pravastatin  40 mg Oral Daily  . rivaroxaban  10 mg Oral Daily   Continuous Infusions: . methocarbamol (ROBAXIN) IV      Antimicrobials: Anti-infectives (From admission, onward)   Start     Dose/Rate Route Frequency Ordered Stop   11/04/19 1800  ceFAZolin (ANCEF) IVPB 2g/100 mL premix     2 g 200 mL/hr over 30 Minutes Intravenous Every 6 hours 11/04/19 1515 11/05/19 0059   11/04/19 1115  ceFAZolin (ANCEF) IVPB 2g/100 mL premix     2 g 200 mL/hr over 30 Minutes Intravenous On call to O.R. 11/04/19 1106 11/04/19 1158       Objective: Vitals: Today's Vitals   11/09/19 2158 11/10/19 0529 11/10/19 0529 11/10/19 0952  BP: 124/67 108/67 108/67   Pulse: 93 84 84   Resp: (!) 21 (!) 21 (!) 21   Temp: 98.5 F (36.9 C) 98.8 F (37.1 C) 98.8 F (37.1 C)   TempSrc: Oral Oral Oral   SpO2: 99% 96% 96%   Weight:      Height:      PainSc: 0-No pain   0-No pain    Intake/Output Summary (Last 24 hours) at 11/10/2019 1150 Last data filed at 11/10/2019 0600 Gross per 24 hour  Intake 820 ml  Output 200 ml  Net 620 ml   Filed Weights   11/03/19 2111 11/04/19 1124  Weight: 70.7 kg 70.7 kg   Weight change:    Intake/Output from previous day: 05/31 0701 - 06/01 0700 In: 820 [P.O.:820] Out: 200 [Urine:200] Intake/Output this shift: No intake/output data recorded.  Examination:  General exam: AAOX3 , NAD, weak  appearing. HEENT:Oral mucosa moist, Ear/Nose WNL grossly, dentition normal. Respiratory system: bilaterally basal crackles,no wheezing or crackles,no use of accessory muscle Cardiovascular system: S1 & S2 +, No JVD,. Gastrointestinal system: Abdomen soft, NT,ND, BS+ Nervous System:Alert, awake, moving extremities and grossly nonfocal Extremities: Mild edema, right hip with bruise and surgical site clean and dressed without drainage distal peripheral pulses palpable.  Skin: No rashes,no icterus. MSK: Normal muscle bulk,tone, power  Data Reviewed: I have personally reviewed following labs and imaging studies CBC: Recent Labs  Lab 11/03/19 1613 11/04/19 0313 11/06/19 0332 11/07/19 0318 11/08/19 0439 11/09/19 0809 11/10/19 0353  WBC 9.0   < > 8.2 8.4 7.9 8.9 10.0  NEUTROABS 7.4  --   --   --   --   --   --   HGB 10.0*   < > 8.2* 7.4* 8.6* 8.7* 8.7*  HCT 32.2*   < > 26.8* 24.1* 28.8* 27.8* 28.1*  MCV 88.7   < > 90.2 90.3 87.8 87.1 86.5  PLT 313   < > 247 265 281 241 277   < > = values in this interval not displayed.   Basic Metabolic Panel: Recent Labs  Lab 11/05/19 0352 11/06/19 0332 11/07/19 0318 11/08/19 0439 11/10/19 0353  NA 135 138 138 137 138  K 4.3 3.4* 4.7 3.8 4.2  CL 101 102 104 103 102  CO2 27 28 28 29 27   GLUCOSE 190* 128* 118* 134* 142*  BUN 11 16 13 11 8   CREATININE 0.58 0.50 0.54 0.39* 0.57  CALCIUM 9.2 8.7* 8.5* 8.6* 8.8*  MG  --  1.7  --   --   --    GFR: Estimated Creatinine Clearance: 54.3 mL/min (by C-G formula based on SCr of 0.57 mg/dL). Liver Function Tests: Recent Labs  Lab 11/03/19 1613  AST 22  ALT 14  ALKPHOS 79  BILITOT 0.4  PROT 7.8  ALBUMIN 2.9*   No results for input(s): LIPASE, AMYLASE in the last 168 hours. No results for input(s): AMMONIA in the last 168 hours. Coagulation Profile: Recent Labs  Lab 11/03/19 2126  INR 1.1   Cardiac Enzymes: Recent Labs  Lab 11/03/19 1613  CKTOTAL 147   BNP (last 3 results) No  results for input(s): PROBNP in the last 8760 hours. HbA1C: No results for input(s): HGBA1C in the last 72 hours. CBG: Recent Labs  Lab 11/09/19 1155 11/09/19 1659 11/09/19 2200 11/10/19 0728 11/10/19 1135  GLUCAP 123* 165* 149* 132* 110*   Lipid Profile: No results for input(s): CHOL, HDL, LDLCALC, TRIG, CHOLHDL, LDLDIRECT in the last 72 hours. Thyroid Function Tests: No results for input(s): TSH, T4TOTAL, FREET4, T3FREE, THYROIDAB in the last 72 hours. Anemia Panel: No results for input(s): VITAMINB12, FOLATE, FERRITIN, TIBC, IRON, RETICCTPCT in the last 72 hours. Sepsis Labs: No results for input(s): PROCALCITON, LATICACIDVEN in the last 168 hours.  Recent Results (from the past 240 hour(s))  SARS Coronavirus 2 by RT PCR (hospital order, performed in [facility] hospital lab) Nasopharyngeal Nasopharyngeal Swab     Status: None   Collection Time: 11/03/19  7:30 PM   Specimen: Nasopharyngeal Swab  Result Value Ref Range Status   SARS Coronavirus 2 NEGATIVE NEGATIVE Final    Comment: (NOTE) SARS-CoV-2 target nucleic acids are NOT DETECTED. The SARS-CoV-2 RNA is generally detectable in upper and lower respiratory specimens during the acute phase of infection. The lowest concentration of SARS-CoV-2 viral copies this assay can detect is 250 copies / mL. A negative result does not preclude SARS-CoV-2 infection and should not be used as the sole basis for treatment or other patient management decisions.  A negative result may occur with improper specimen collection / handling, submission of specimen other than nasopharyngeal swab, presence of viral mutation(s) within the areas targeted by this assay, and inadequate number of viral copies (<250 copies / mL). A negative result must be combined with clinical observations, patient history, and epidemiological information. Fact Sheet for Patients:   StrictlyIdeas.no Fact Sheet for Healthcare  Providers: BankingDealers.co.za This test is not yet approved or cleared  by the [place] FDA and has been authorized for detection and/or diagnosis of SARS-CoV-2 by FDA under an Emergency Use Authorization (EUA).  This EUA will remain in effect (meaning this test can be used) for the duration of the COVID-19 declaration under Section 564(b)(1) of the Act, 21 U.S.C. section 360bbb-3(b)(1), unless the authorization is terminated or revoked sooner. Performed at [facility], [place] [address], [place], [place] [address]  Surgical PCR screen     Status: None   Collection Time: 11/04/19  8:50 AM   Specimen: Nasal Mucosa; Nasal Swab  Result Value Ref Range Status   MRSA, PCR NEGATIVE NEGATIVE Final   Staphylococcus aureus NEGATIVE NEGATIVE Final    Comment: (NOTE) The Xpert SA Assay (FDA approved for NASAL specimens in patients 47 years of age and older), is one component of a comprehensive surveillance program. It is not intended to diagnose infection nor to guide or monitor treatment. Performed at [facility], [place] [address] [place], [place] [address]     Radiology Studies: DG Sacrum/Coccyx  Result Date: 11/08/2019 CLINICAL DATA:  Mass on right buttocks. EXAM: SACRUM AND COCCYX - 2+ VIEW COMPARISON:  None. FINDINGS: Patient is status post right hip replacement. Visualized portions of the hardware in good position. No bony abnormalities. No fractures. IMPRESSION: No acute bony abnormalities.  Soft tissues are grossly unremarkable. Electronically Signed   By: Dorise Bullion III M.D   On: 11/08/2019 14:36     LOS: 7 days   Antonieta Pert, MD [facility]  11/10/2019, 11:50 AM

## 2019-11-10 NOTE — Care Management Important Message (Signed)
Important Message  Patient Details IM Letter given to [place] Case Manager to present to the Patient Name: [name] MRN: [id number] Date of Birth: [birth year]   Medicare Important Message Given:  Yes     Kerin Salen 11/10/2019, 12:16 PM

## 2019-11-11 LAB — CBC
HCT: 31.6 % — ABNORMAL LOW (ref 36.0–46.0)
Hemoglobin: 9.7 g/dL — ABNORMAL LOW (ref 12.0–15.0)
MCH: 27 pg (ref 26.0–34.0)
MCHC: 30.7 g/dL (ref 30.0–36.0)
MCV: 88 fL (ref 80.0–100.0)
Platelets: 289 10*3/uL (ref 150–400)
RBC: 3.59 MIL/uL — ABNORMAL LOW (ref 3.87–5.11)
RDW: 17.9 % — ABNORMAL HIGH (ref 11.5–15.5)
WBC: 8.5 10*3/uL (ref 4.0–10.5)
nRBC: 0 % (ref 0.0–0.2)

## 2019-11-11 LAB — BASIC METABOLIC PANEL
Anion gap: 9 (ref 5–15)
BUN: 9 mg/dL (ref 8–23)
CO2: 26 mmol/L (ref 22–32)
Calcium: 9 mg/dL (ref 8.9–10.3)
Chloride: 101 mmol/L (ref 98–111)
Creatinine, Ser: 0.73 mg/dL (ref 0.44–1.00)
GFR calc Af Amer: >60 mL/min (ref >60)
GFR calc non Af Amer: >60 mL/min (ref >60)
Glucose, Bld: 139 mg/dL — ABNORMAL HIGH (ref 70–99)
Potassium: 4.2 mmol/L (ref 3.5–5.1)
Sodium: 136 mmol/L (ref 135–145)

## 2019-11-11 LAB — GLUCOSE, CAPILLARY
Glucose-Capillary: 123 mg/dL — ABNORMAL HIGH (ref 70–99)
Glucose-Capillary: 140 mg/dL — ABNORMAL HIGH (ref 70–99)
Glucose-Capillary: 118 mg/dL — ABNORMAL HIGH (ref 70–99)
Glucose-Capillary: 116 mg/dL — ABNORMAL HIGH (ref 70–99)

## 2019-11-11 NOTE — Progress Notes (Signed)
Occupational Therapy Treatment Patient Details Name: [name] MRN: [id number] DOB: 03-04-1941 Today's Date: 11/11/2019    History of present illness R hip fx, s/p AA-THA on 11/04/19; PMH of DM, interstitial lung disease (uses 2L O2 prn at baseline)CT scan chest revealed 7.1 x 5.9 mediastinal adenopathy(known dx per chart)   OT comments  Patient has progressed well towards acute OT goals. Patient overall supervision for functional transfers and self care excluding LB dressing/bathing of R LE. Patient reports she had some difficulty with dressing prior to fracture with R LE "I don't wear socks at home." Patient requires multiple extended rest breaks between self care tasks due to shallow mouth breathing, patient requesting to put on O2. Cue patient in pursed lip breathing techniques to maximize her recovery during rest breaks. Patient very appreciative to perform sponge bath this morning. Will continue to follow.   Follow Up Recommendations  SNF    Equipment Recommendations  Other (comment)(TBD)       Precautions / Restrictions Precautions Precautions: Fall Restrictions RLE Weight Bearing: Weight bearing as tolerated       Mobility Bed Mobility Overal bed mobility: Needs Assistance Bed Mobility: Supine to Sit;Sit to Supine     Supine to sit: Supervision;HOB elevated Sit to supine: Supervision;HOB elevated   General bed mobility comments: patient requesting to do it herself, does not want bed adjusted at all  Transfers Overall transfer level: Needs assistance Equipment used: Rolling walker (2 wheeled) Transfers: Sit to/from Stand Sit to Stand: Supervision         General transfer comment: patient requesting to do it herself, "I want your nearby just in case"    Balance Overall balance assessment: History of Falls;Needs assistance Sitting-balance support: No upper extremity supported;Feet supported Sitting balance-Leahy Scale: Good     Standing balance support:  Bilateral upper extremity supported;No upper extremity supported;During functional activity Standing balance-Leahy Scale: Fair Standing balance comment: can take few steps from chair to bed without walker however increased safety with walker during transfers                           ADL either performed or assessed with clinical judgement   ADL Overall ADL's : Needs assistance/impaired     Grooming: Oral care;Wash/dry face;Supervision/safety;Sitting;Standing   Upper Body Bathing: Set up;Sitting   Lower Body Bathing: Supervison/ safety;Sitting/lateral leans;Sit to/from stand Lower Body Bathing Details (indicate cue type and reason): unable to wash lower legs due to TED hoes Upper Body Dressing : Set up;Sitting   Lower Body Dressing: Moderate assistance;Sitting/lateral leans Lower Body Dressing Details (indicate cue type and reason): pt able to don L sock, unable to don R Toilet Transfer: Supervision/safety;Regular Toilet;Ambulation;RW;Cueing for safety   Toileting- Clothing Manipulation and Hygiene: Supervision/safety;Sit to/from stand       Functional mobility during ADLs: Supervision/safety;Rolling walker;Cueing for safety General ADL Comments: patient requires significant rest breaks and intermittent use of 2L O2 during self care this AM, mod cues for pursed lip breathing vs shallow mouth breathing               Cognition Arousal/Alertness: Awake/alert Behavior During Therapy: WFL for tasks assessed/performed Overall Cognitive Status: Within Functional Limits for tasks assessed                                 General Comments: asked patient if feeling anxious this morning due to shallow  mouth breathing, patient states shes "not a morning person."                    Pertinent Vitals/ Pain       Pain Assessment: Faces Faces Pain Scale: Hurts a little bit Pain Location: R groin Pain Descriptors / Indicators: Discomfort Pain  Intervention(s): Monitored during session         Frequency  Min 2X/week        Progress Toward Goals  OT Goals(current goals can now be found in the care plan section)  Progress towards OT goals: Progressing toward goals  Acute Rehab OT Goals Patient Stated Goal: DC home OT Goal Formulation: With patient Time For Goal Achievement: 11/19/19 Potential to Achieve Goals: Good ADL Goals Pt Will Perform Grooming: with supervision;sitting;standing;with modified independence Pt Will Perform Upper Body Dressing: with modified independence;sitting Pt Will Perform Lower Body Dressing: with supervision;with adaptive equipment;sit to/from stand;sitting/lateral leans Pt Will Transfer to Toilet: with supervision;ambulating;bedside commode;with modified independence Pt Will Perform Toileting - Clothing Manipulation and hygiene: with supervision;sit to/from stand;sitting/lateral leans;with modified independence  Plan Discharge plan remains appropriate       AM-PAC OT "6 Clicks" Daily Activity     Outcome Measure   Help from another person eating meals?: None Help from another person taking care of personal grooming?: A Little Help from another person toileting, which includes using toliet, bedpan, or urinal?: A Little Help from another person bathing (including washing, rinsing, drying)?: A Little Help from another person to put on and taking off regular upper body clothing?: A Little Help from another person to put on and taking off regular lower body clothing?: A Lot 6 Click Score: 18    End of Session Equipment Utilized During Treatment: Rolling walker;Oxygen  OT Visit Diagnosis: Other abnormalities of gait and mobility (R26.89);History of falling (Z91.81);Pain Pain - Right/Left: Right Pain - part of body: Hip   Activity Tolerance Patient tolerated treatment well   Patient Left in bed;with call bell/phone within reach   Nurse Communication Mobility status        Time:  0315-9458 OT Time Calculation (min): 58 min  Charges: OT General Charges $OT Visit: 1 Visit OT Treatments $Self Care/Home Management : 53-67 mins  11/11/2019  Delbert Phenix OT Pager: [phone]   Rosemary Holms 11/11/2019, 1:24 PM

## 2019-11-11 NOTE — Progress Notes (Signed)
NAME:  [name], MRN:  [id number], DOB:  1940-11-18, LOS: 8 ADMISSION DATE:  11/03/2019, CONSULTATION DATE:  11/06/2019 REFERRING MD:  Antonieta Pert, CHIEF COMPLAINT:  Lung mass    Brief History   79yo presented to ED with complaint of right hip pain post fall with diagnosis of right femoral neck fracture for which orthopedic surgery was consulted. Underwent total hip arthroplasty 5/26. PCCM consulted for management of left lung mass.   History of present illness   [name] is 71 who presented to ED with right hip pain post mechanical fall and was found to have acute femoral neck fracture for which he underwent total hip arthroplasty 5/26.    Additional work-up on admission including CT scan chest revealed 7.1 x 5.9 mediastinal adenopathy.  Per chart review and patient this is a known diagnosis. Per chart review it appears patient has consulted with both  Dr. Roxan Hockey and our pulmonary team including Dr. Valeta Harms regarding biopsy and surgical approaches. Patient states she deferred any further management or work-up including biopsy as she was actively caring for her husband who recently passed away from cancer. Most recent PET scan 06/05/2018.   On evaluation today patient and family request in patient workup of this mass as "I have already pushed this a year, I'd like to get it taken care of now".    Past Medical History  Thyroid disease  ILD IBS Hyperplastic colon polyp Diabetes  Bronchiectasis   Significant Hospital Events   Admitted 5/25  Consults:  Ortho  TRH  Procedures:  Total hip arthroplasty 5/26  Significant Diagnostic Tests:  CT chest 5/26 > 1. Interval development of a 7.1 x 5.9 cm left upper lobe masslike opacity extending into the left hilum. While this could possibly represent pneumonia, imaging features are concerning for neoplasm. 2. Interval development of mediastinal lymphadenopathy. Metastatic disease not excluded. 3. Underlying chronic  interstitial/fibrotic lung disease. 4. Aortic Atherosclerosis (ICD10-I70.0).  Micro Data:  COVID 5/25  Antimicrobials:     Interim history/subjective:   Feels about the same.  Good sleep last night  Objective   Blood pressure (!) 114/97, pulse 93, temperature 97.7 F (36.5 C), temperature source Oral, resp. rate 13, height 5\' 6"  (1.676 m), weight 70.7 kg, SpO2 94 %.        Intake/Output Summary (Last 24 hours) at 11/11/2019 1431 Last data filed at 11/11/2019 3664 Gross per 24 hour  Intake 960 ml  Output 0 ml  Net 960 ml   Filed Weights   11/03/19 2111 11/04/19 1124  Weight: 70.7 kg 70.7 kg    Examination: General: Chronic ill-appearing woman, comfortable in bed HEENT: Oropharynx clear, pupils equal Neuro: Awake, alert, well oriented, moves all extremities, follows commands CV: Regular, distant, no murmur PULM: Decreased at both bases, some rhonchi on the left, bilateral inspiratory crackles GI: Nondistended Extremities: No edema Skin: No rash  Resolved Hospital Problem list     Assessment & Plan:  Left lung mass larger compared with prior scan 05/2018 Hx of ILD Hx of bronchiectasis -CT scan chest on this admission revealed enlarging, now 7.1 x 5.9,  mediastinal adenopathy P: Discussed rationale of bronchoscopy with her yesterday and today.  She understands and agrees to proceed.  I believe it might be reasonable to do this under general anesthesia to facilitate EBUS if there is not an endobronchial lesion. Xarelto on hold Plan n.p.o. prior to date of procedure   Best practice:  Diet: Carb modified Pain/Anxiety/Delirium protocol (if indicated):  As needed VAP protocol (if indicated): Not applicable DVT prophylaxis: Xarelto  GI prophylaxis: PPI Glucose control: SSI Mobility: Up with assistance  Code Status: Full Family Communication: Discussed with patient and children at bedside 6/1 Disposition: Floor   Labs   CBC: Recent Labs  Lab 11/07/19 0318  11/08/19 0439 11/09/19 0809 11/10/19 0353 11/11/19 0456  WBC 8.4 7.9 8.9 10.0 8.5  HGB 7.4* 8.6* 8.7* 8.7* 9.7*  HCT 24.1* 28.8* 27.8* 28.1* 31.6*  MCV 90.3 87.8 87.1 86.5 88.0  PLT 265 281 241 277 671    Basic Metabolic Panel: Recent Labs  Lab 11/06/19 0332 11/07/19 0318 11/08/19 0439 11/10/19 0353 11/11/19 0456  NA 138 138 137 138 136  K 3.4* 4.7 3.8 4.2 4.2  CL 102 104 103 102 101  CO2 28 28 29 27 26   GLUCOSE 128* 118* 134* 142* 139*  BUN 16 13 11 8 9   CREATININE 0.50 0.54 0.39* 0.57 0.73  CALCIUM 8.7* 8.5* 8.6* 8.8* 9.0  MG 1.7  --   --   --   --    GFR: Estimated Creatinine Clearance: 54.3 mL/min (by C-G formula based on SCr of 0.73 mg/dL). Recent Labs  Lab 11/08/19 0439 11/09/19 0809 11/10/19 0353 11/11/19 0456  WBC 7.9 8.9 10.0 8.5    Liver Function Tests: No results for input(s): AST, ALT, ALKPHOS, BILITOT, PROT, ALBUMIN in the last 168 hours. No results for input(s): LIPASE, AMYLASE in the last 168 hours. No results for input(s): AMMONIA in the last 168 hours.  ABG    Component Value Date/Time   TCO2 29 05/13/2018 2117     Coagulation Profile: No results for input(s): INR, PROTIME in the last 168 hours.  Cardiac Enzymes: No results for input(s): CKTOTAL, CKMB, CKMBINDEX, TROPONINI in the last 168 hours.  HbA1C: Hgb A1c MFr Bld  Date/Time Value Ref Range Status  11/04/2019 03:13 AM 6.2 (H) 4.8 - 5.6 % Final    Comment:    (NOTE) Pre diabetes:          5.7%-6.4% Diabetes:              >6.4% Glycemic control for   <7.0% adults with diabetes     CBG: Recent Labs  Lab 11/10/19 1135 11/10/19 1641 11/10/19 2049 11/11/19 0732 11/11/19 1147  GLUCAP 110* 96 136* 123* 140*      Signature:    Baltazar Apo, MD, PhD 11/11/2019, 2:31 PM [facility] [phone] or if no answer [phone]

## 2019-11-11 NOTE — Progress Notes (Signed)
PROGRESS NOTE    [name]  HEN:[id number] DOB: 24-Jan-1941 DOA: 11/03/2019 PCP: [name], MD    Brief Narrative: 79 year old female with significant complex medical history of chronic interstitial lung disease, hypothyroidism, type 2 diabetes mellitus, mediastinal adenopathy, lung mass presented with right hip pain after a fall while getting out of the chair and landing on her right hip on 11/03/2019. In the ED Covid negative, chest x-ray with mild atelectasis with infiltrate within the right lung base and peripheral right upper lobe new mild patchy opacity, and acute fracture of the proximal right femur and underwent right hip replacement by Dr. Alvan Dame on 5/26   Assessment & Plan:   Active Problems:   INTERSTITIAL LUNG DISEASE   Mass of left lung   Type 2 diabetes mellitus with hyperlipidemia (HCC)   Fracture of femoral neck, right (HCC)   Unwitnessed fall   Mediastinal adenopathy   Hypothyroidism   Fall at home    Fracture of right femoral neck -status post fall.  Right hip replacement 11/04/2019.  Patient was on Xarelto for DVT prophylaxis which is on hold now for bronc and biopsy 11/13/2019.  PT OT recommending SNF.    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  Echo showed EF normal grade 1 diastolic dysfunction and moderately elevated pulmonary artery systolic pressure, followed by pulmonology Dr. Jone Baseman discussed biopsy at that time patient wanted to defer due to patient's husband's recent death, patient family wanted biopsy after discussion this hospital stay.    Patient seen by PCCM plan for bronc and biopsy 11/13/2019.  Xarelto on hold for the procedure.   Chronic interstitial lung disease/chronic hypoxic respiratory failure: uses 2 L nasal cannula intermittently at home per family.  On room air.  Deconditioning/fall  continue PT OT will need a skilled nursing facility.  Hypothyroidism TSH normal.  Continue Synthroid  T2DM with hyperlipidemia: Holding home pioglitazone. A1c 6.2 on 5/26.  Blood sugar is controlled.  Continue sliding scale. Last Labs          Recent Labs  Lab 11/09/19 1155 11/09/19 1659 11/09/19 2200 11/10/19 0728 11/10/19 1135  GLUCAP 123* 165* 149* 132* 110*     HyperLipidemia continue pravastatin  Anemia from acute blood loss in the setting of  Hip surgery- FOBT negative.  No obvious blood loss present.  Status post 1 unitPRBC HB improved in 8.7 g has been holding steady.  CBC in 1 week.  Sacral bump x-ray was unremarkable.      Nutrition Problem: Increased nutrient needs Etiology: hip fracture, post-op healing     Signs/Symptoms: estimated needs    Interventions: Ensure Enlive (each supplement provides 350kcal and 20 grams of protein), MVI  Estimated body mass index is 25.16 kg/m as calculated from the following:   Height as of this encounter: 5\' 6"  (1.676 m).   Weight as of this encounter: 70.7 kg.  DVT prophylaxis: doac Code Status: Full code Family Communication: None at bedside Disposition Plan:  Status is: Inpatient    Dispo: The patient is from: Home              Anticipated d/c is to SNF              Anticipated d/c date is unknown              Patient currently is not medically stable to d/c.  Patient is waiting to have bronc and biopsy 11/13/2019 patient has a lung mass.     Consultants:   PCCM and Ortho PCCM were Ortho  Procedures: See above Antimicrobials: Anti-infectives (From admission, onward)   Start     Dose/Rate Route Frequency Ordered Stop   11/04/19 1800  ceFAZolin (ANCEF) IVPB 2g/100 mL premix     2 g 200 mL/hr over 30 Minutes Intravenous Every 6 hours 11/04/19 1515 11/05/19 0059   11/04/19 1115  ceFAZolin (ANCEF) IVPB 2g/100 mL premix     2 g 200 mL/hr over 30 Minutes Intravenous On call to O.R. 11/04/19 1106  11/04/19 1158       Subjective: She is awake alert sitting up in chair in no acute distress no specific complaints or new complaints reported.  Objective: Vitals:   11/10/19 1425 11/10/19 2152 11/11/19 0613 11/11/19 1340  BP: 118/72 110/68 115/61 (!) 114/97  Pulse: 92 94 84 93  Resp: 18 18 17 13   Temp: 98 F (36.7 C) 98.5 F (36.9 C)  97.7 F (36.5 C)  TempSrc: Oral Oral  Oral  SpO2: 100% 96% 96% 94%  Weight:      Height:        Intake/Output Summary (Last 24 hours) at 11/11/2019 1354 Last data filed at 11/11/2019 0552 Gross per 24 hour  Intake 1060 ml  Output 0 ml  Net 1060 ml   Filed Weights   11/03/19 2111 11/04/19 1124  Weight: 70.7 kg 70.7 kg    Examination:  General exam: Appears calm and comfortable  Respiratory system: Clear to auscultation. Respiratory effort normal. Cardiovascular system: S1 & S2 heard, RRR. No JVD, murmurs, rubs, gallops or clicks. No pedal edema. Gastrointestinal system: Abdomen is nondistended, soft and nontender. No organomegaly or masses felt. Normal bowel sounds heard. Central nervous system: Alert and oriented. No focal neurological deficits. Extremities: Symmetric 5 x 5 power. Skin: No rashes, lesions or ulcers Psychiatry: Judgement and insight appear normal. Mood & affect appropriate.     Data Reviewed: I have personally reviewed following labs and imaging studies  CBC: Recent Labs  Lab 11/07/19 0318 11/08/19 0439 11/09/19 0809 11/10/19 0353 11/11/19 0456  WBC 8.4 7.9 8.9 10.0 8.5  HGB 7.4* 8.6* 8.7* 8.7* 9.7*  HCT 24.1* 28.8* 27.8* 28.1* 31.6*  MCV 90.3 87.8 87.1 86.5 88.0  PLT 265 281 241 277 786   Basic Metabolic Panel: Recent Labs  Lab 11/06/19 0332 11/07/19 0318 11/08/19 0439 11/10/19 0353 11/11/19 0456  NA 138 138 137 138 136  K 3.4* 4.7 3.8 4.2 4.2  CL 102 104 103 102 101  CO2 28 28 29 27 26   GLUCOSE 128* 118* 134* 142* 139*  BUN 16 13 11 8 9   CREATININE 0.50 0.54 0.39* 0.57 0.73  CALCIUM 8.7* 8.5*  8.6* 8.8* 9.0  MG 1.7  --   --   --   --    GFR: Estimated Creatinine Clearance: 54.3 mL/min (by C-G formula based on SCr of 0.73 mg/dL). Liver Function Tests: No results for input(s): AST, ALT, ALKPHOS, BILITOT, PROT, ALBUMIN in the last 168 hours. No results for input(s): LIPASE, AMYLASE in the last 168 hours. No results for input(s): AMMONIA in the last 168 hours. Coagulation Profile: No results for input(s): INR, PROTIME in the last 168 hours. Cardiac Enzymes: No results for input(s): CKTOTAL, CKMB, CKMBINDEX, TROPONINI in the last 168 hours. BNP (last 3 results) No results for input(s): PROBNP in the last 8760 hours. HbA1C: No results  for input(s): HGBA1C in the last 72 hours. CBG: Recent Labs  Lab 11/10/19 1135 11/10/19 1641 11/10/19 2049 11/11/19 0732 11/11/19 1147  GLUCAP 110* 96 136* 123* 140*   Lipid Profile: No results for input(s): CHOL, HDL, LDLCALC, TRIG, CHOLHDL, LDLDIRECT in the last 72 hours. Thyroid Function Tests: No results for input(s): TSH, T4TOTAL, FREET4, T3FREE, THYROIDAB in the last 72 hours. Anemia Panel: No results for input(s): VITAMINB12, FOLATE, FERRITIN, TIBC, IRON, RETICCTPCT in the last 72 hours. Sepsis Labs: No results for input(s): PROCALCITON, LATICACIDVEN in the last 168 hours.  Recent Results (from the past 240 hour(s))  SARS Coronavirus 2 by RT PCR (hospital order, performed in [facility] hospital lab) Nasopharyngeal Nasopharyngeal Swab     Status: None   Collection Time: 11/03/19  7:30 PM   Specimen: Nasopharyngeal Swab  Result Value Ref Range Status   SARS Coronavirus 2 NEGATIVE NEGATIVE Final    Comment: (NOTE) SARS-CoV-2 target nucleic acids are NOT DETECTED. The SARS-CoV-2 RNA is generally detectable in upper and lower respiratory specimens during the acute phase of infection. The lowest concentration of SARS-CoV-2 viral copies this assay can detect is 250 copies / mL. A negative result does not preclude SARS-CoV-2  infection and should not be used as the sole basis for treatment or other patient management decisions.  A negative result may occur with improper specimen collection / handling, submission of specimen other than nasopharyngeal swab, presence of viral mutation(s) within the areas targeted by this assay, and inadequate number of viral copies (<250 copies / mL). A negative result must be combined with clinical observations, patient history, and epidemiological information. Fact Sheet for Patients:   StrictlyIdeas.no Fact Sheet for Healthcare Providers: BankingDealers.co.za This test is not yet approved or cleared  by the [place] FDA and has been authorized for detection and/or diagnosis of SARS-CoV-2 by FDA under an Emergency Use Authorization (EUA).  This EUA will remain in effect (meaning this test can be used) for the duration of the COVID-19 declaration under Section 564(b)(1) of the Act, 21 U.S.C. section 360bbb-3(b)(1), unless the authorization is terminated or revoked sooner. Performed at [facility], [place] [address] [place], [place] [address]   Surgical PCR screen     Status: None   Collection Time: 11/04/19  8:50 AM   Specimen: Nasal Mucosa; Nasal Swab  Result Value Ref Range Status   MRSA, PCR NEGATIVE NEGATIVE Final   Staphylococcus aureus NEGATIVE NEGATIVE Final    Comment: (NOTE) The Xpert SA Assay (FDA approved for NASAL specimens in patients 22 years of age and older), is one component of a comprehensive surveillance program. It is not intended to diagnose infection nor to guide or monitor treatment. Performed at [facility], [place] [address] [place], [place] [address]          Radiology Studies: No results found.      Scheduled Meds: . vitamin C  1,000 mg Oral QHS  . celecoxib  200 mg Oral BID  . Chlorhexidine Gluconate Cloth  6 each Topical Daily  . docusate  sodium  100 mg Oral BID  . feeding supplement (ENSURE ENLIVE)  237 mL Oral BID BM  . ferrous sulfate  325 mg Oral TID PC  . insulin aspart  0-9 Units Subcutaneous TID WC  . levothyroxine  88 mcg Oral QAC breakfast  . magic mouthwash  5 mL Oral TID  . multivitamin with minerals  1 tablet Oral Daily  . pravastatin  40  mg Oral Daily   Continuous Infusions: . methocarbamol (ROBAXIN) IV       LOS: 8 days     Georgette Shell, MD 11/11/2019, 1:54 PM

## 2019-11-11 NOTE — Progress Notes (Signed)
Nutrition Follow-up  INTERVENTION:   -Ensure Enlive po BID, each supplement provides 350 kcal and 20 grams of protein  NUTRITION DIAGNOSIS:   Increased nutrient needs related to hip fracture, post-op healing as evidenced by estimated needs.  Ongoing.  GOAL:   Patient will meet greater than or equal to 90% of their needs  Progressing.  MONITOR:   Supplement acceptance, Labs, PO intake, Weight trends, I & O's  ASSESSMENT:   79 y.o. female presented to the ER with a chief complaint of right hip pain after fall that occurred yesterday.  States that she was at home getting out of her chair at approximately 2 AM when she fell.  States that she laid there for about 12 hours until her son found her just prior to arrival to the ER. Admitted for hip fracture.  Patient currently consuming 50-100% of meals at this time. Pt is drinking Ensures, ~1/day.  Pt now having work-up for lung mass.   Admission weight: 155 lbs. No new weight has been measured for this admission.  I/Os: -1.5L since admit  Medications: Vitamin C tablet, Colace, Ferrous sulfate, Magic Mouthwash, Multivitamin with minerals daily Labs reviewed: CBGs: 123-140  Diet Order:   Diet Order            Diet Carb Modified Fluid consistency: Thin; Room service appropriate? Yes  Diet effective now              EDUCATION NEEDS:   No education needs have been identified at this time  Skin:  Skin Assessment: Reviewed RN Assessment  Last BM:  6/1 -type 2  Height:   Ht Readings from Last 1 Encounters:  11/04/19 5\' 6"  (1.676 m)    Weight:   Wt Readings from Last 1 Encounters:  11/04/19 70.7 kg   BMI:  Body mass index is 25.16 kg/m.  Estimated Nutritional Needs:   Kcal:  1700-1900  Protein:  75-85g  Fluid:  1.7L/day  Clayton Bibles, MS, RD, LDN Inpatient Clinical Dietitian Contact information available via Amion

## 2019-11-11 NOTE — Progress Notes (Signed)
Physical Therapy Treatment Patient Details Name: [name] MRN: [id number] DOB: 08-13-1940 Today's Date: 11/11/2019    History of Present Illness R hip fx, s/p AA-THA on 11/04/19; PMH of DM, interstitial lung disease (uses 2L O2 prn at baseline)CT scan chest revealed 7.1 x 5.9 mediastinal adenopathy(known dx per chart)    PT Comments    Pt progressing toward PT goals. She prefers to do things herself and not have any assist unless absolutely necessary. Activity tol improving however continues to need rest breaks d/t DOE.continue to follow   Follow Up Recommendations  SNF     Equipment Recommendations  Other (comment)(defer to SNF)    Recommendations for Other Services       Precautions / Restrictions Precautions Precautions: Fall Restrictions RLE Weight Bearing: Weight bearing as tolerated    Mobility  Bed Mobility Overal bed mobility: Modified Independent Bed Mobility: Supine to Sit;Sit to Supine     Supine to sit: Supervision;HOB elevated Sit to supine: Supervision;HOB elevated   General bed mobility comments: HOB elevated, leave per pt request, no physical assist   Transfers Overall transfer level: Needs assistance Equipment used: Rolling walker (2 wheeled) Transfers: Sit to/from Stand Sit to Stand: Supervision         General transfer comment: for safety   Ambulation/Gait Ambulation/Gait assistance: Supervision;Min guard Gait Distance (Feet): 40 Feet(20' and 50') Assistive device: Rolling walker (2 wheeled) Gait Pattern/deviations: Step-to pattern;Decreased stance time - right     General Gait Details: supervision to min/guard for safety. pt requested O2 for amb,  SpO2 stable, 2-3/4 DOE. seated rests between distances above   Stairs             Wheelchair Mobility    Modified Rankin (Stroke Patients Only)       Balance Overall balance assessment: History of Falls;Needs assistance Sitting-balance support: No upper extremity  supported;Feet supported Sitting balance-Leahy Scale: Good     Standing balance support: Bilateral upper extremity supported;No upper extremity supported;During functional activity Standing balance-Leahy Scale: Fair Standing balance comment: pt is able to reach outside BOS ~ 8 inches. she is able to lean, straighten sheets on bed with unilateral UE support                             Cognition Arousal/Alertness: Awake/alert Behavior During Therapy: WFL for tasks assessed/performed Overall Cognitive Status: Within Functional Limits for tasks assessed                                 General Comments: asked patient if feeling anxious this morning due to shallow mouth breathing, patient states shes "not a morning person."       Exercises      General Comments        Pertinent Vitals/Pain Pain Assessment: No/denies pain Faces Pain Scale: Hurts a little bit Pain Location: R groin Pain Descriptors / Indicators: Discomfort Pain Intervention(s): Monitored during session    Home Living                      Prior Function            PT Goals (current goals can now be found in the care plan section) Acute Rehab PT Goals Patient Stated Goal: home after rehab PT Goal Formulation: With patient Time For Goal Achievement: 11/19/19 Potential to Achieve Goals: Good Progress towards  PT goals: Progressing toward goals    Frequency    Min 3X/week      PT Plan Current plan remains appropriate    Co-evaluation              AM-PAC PT "6 Clicks" Mobility   Outcome Measure  Help needed turning from your back to your side while in a flat bed without using bedrails?: None Help needed moving from lying on your back to sitting on the side of a flat bed without using bedrails?: None Help needed moving to and from a bed to a chair (including a wheelchair)?: None Help needed standing up from a chair using your arms (e.g., wheelchair or bedside  chair)?: None Help needed to walk in hospital room?: A Little Help needed climbing 3-5 steps with a railing? : A Little 6 Click Score: 22    End of Session Equipment Utilized During Treatment: Gait belt Activity Tolerance: Patient tolerated treatment well Patient left: in bed;with call bell/phone within reach Nurse Communication: Mobility status PT Visit Diagnosis: Difficulty in walking, not elsewhere classified (R26.2);History of falling (Z91.81)     Time: 5110-2111 PT Time Calculation (min) (ACUTE ONLY): 16 min  Charges:  $Gait Training: 8-22 mins                     Baxter Flattery, PT   Acute Rehab Dept [facility]): [phone]   11/11/2019    [facility] 11/11/2019, 3:32 PM

## 2019-11-12 DIAGNOSIS — K6289 Other specified diseases of anus and rectum: Secondary | ICD-10-CM

## 2019-11-12 LAB — GLUCOSE, CAPILLARY
Glucose-Capillary: 145 mg/dL — ABNORMAL HIGH (ref 70–99)
Glucose-Capillary: 130 mg/dL — ABNORMAL HIGH (ref 70–99)
Glucose-Capillary: 138 mg/dL — ABNORMAL HIGH (ref 70–99)
Glucose-Capillary: 173 mg/dL — ABNORMAL HIGH (ref 70–99)

## 2019-11-12 NOTE — Progress Notes (Signed)
NAME:  [name], MRN:  [id number], DOB:  09-10-1940, LOS: 9 ADMISSION DATE:  11/03/2019, CONSULTATION DATE:  11/06/2019 REFERRING MD:  Antonieta Pert, CHIEF COMPLAINT:  Lung mass    Brief History   79yo presented to ED with complaint of right hip pain post fall with diagnosis of right femoral neck fracture for which orthopedic surgery was consulted. Underwent total hip arthroplasty 5/26. PCCM consulted for management of left lung mass.   History of present illness   [name] is 51 who presented to ED with right hip pain post mechanical fall and was found to have acute femoral neck fracture for which he underwent total hip arthroplasty 5/26.    Additional work-up on admission including CT scan chest revealed 7.1 x 5.9 mediastinal adenopathy.  Per chart review and patient this is a known diagnosis. Per chart review it appears patient has consulted with both  Dr. Roxan Hockey and our pulmonary team including Dr. Valeta Harms regarding biopsy and surgical approaches. Patient states she deferred any further management or work-up including biopsy as she was actively caring for her husband who recently passed away from cancer. Most recent PET scan 06/05/2018.   On evaluation today patient and family request in patient workup of this mass as "I have already pushed this a year, I'd like to get it taken care of now".    Past Medical History  Thyroid disease  ILD IBS Hyperplastic colon polyp Diabetes  Bronchiectasis   Significant Hospital Events   Admitted 5/25  Consults:  Ortho  TRH  Procedures:  Total hip arthroplasty 5/26  Significant Diagnostic Tests:  CT chest 5/26 > 1. Interval development of a 7.1 x 5.9 cm left upper lobe masslike opacity extending into the left hilum. While this could possibly represent pneumonia, imaging features are concerning for neoplasm. 2. Interval development of mediastinal lymphadenopathy. Metastatic disease not excluded. 3. Underlying chronic  interstitial/fibrotic lung disease. 4. Aortic Atherosclerosis (ICD10-I70.0).  Micro Data:  COVID 5/25  Antimicrobials:     Interim history/subjective:   No problems reported.  Comfortable  Objective   Blood pressure (!) 101/59, pulse 88, temperature 98.7 F (37.1 C), temperature source Oral, resp. rate 16, height 5\' 6"  (1.676 m), weight 70.7 kg, SpO2 95 %.        Intake/Output Summary (Last 24 hours) at 11/12/2019 1331 Last data filed at 11/12/2019 2952 Gross per 24 hour  Intake 600 ml  Output 0 ml  Net 600 ml   Filed Weights   11/03/19 2111 11/04/19 1124  Weight: 70.7 kg 70.7 kg    Examination: General: Pleasant woman, laying in bed comfortably HEENT: No oral lesions, pupils equal Neuro: Awake, alert, appropriate, nonfocal CV: Regular, no murmur PULM: Decreased bibasilar breath sounds, some rhonchi on the left, bibasilar inspiratory crackles GI: Nondistended Extremities: No edema Skin: No rash  Resolved Hospital Problem list     Assessment & Plan:  Left lung mass larger compared with prior scan 05/2018 Hx of ILD Hx of bronchiectasis -CT scan chest on this admission revealed enlarging, now 7.1 x 5.9,  mediastinal adenopathy P: Planning for bronchoscopy, possible EBUS if there is not an endobronchial lesion to be biopsied.  Patient understands the pros, cons, potential complications.  All questions answered.  Plan to proceed 6/4 Xarelto on hold  Best practice:  Diet: Carb modified Pain/Anxiety/Delirium protocol (if indicated): As needed VAP protocol (if indicated): Not applicable DVT prophylaxis: Xarelto  GI prophylaxis: PPI Glucose control: SSI Mobility: Up with assistance  Code  Status: Full Family Communication: Discussed with patient 6/3, and children at bedside 6/1 Disposition: Floor   Labs   CBC: Recent Labs  Lab 11/07/19 0318 11/08/19 0439 11/09/19 0809 11/10/19 0353 11/11/19 0456  WBC 8.4 7.9 8.9 10.0 8.5  HGB 7.4* 8.6* 8.7* 8.7* 9.7*  HCT  24.1* 28.8* 27.8* 28.1* 31.6*  MCV 90.3 87.8 87.1 86.5 88.0  PLT 265 281 241 277 536    Basic Metabolic Panel: Recent Labs  Lab 11/06/19 0332 11/07/19 0318 11/08/19 0439 11/10/19 0353 11/11/19 0456  NA 138 138 137 138 136  K 3.4* 4.7 3.8 4.2 4.2  CL 102 104 103 102 101  CO2 28 28 29 27 26   GLUCOSE 128* 118* 134* 142* 139*  BUN 16 13 11 8 9   CREATININE 0.50 0.54 0.39* 0.57 0.73  CALCIUM 8.7* 8.5* 8.6* 8.8* 9.0  MG 1.7  --   --   --   --    GFR: Estimated Creatinine Clearance: 54.3 mL/min (by C-G formula based on SCr of 0.73 mg/dL). Recent Labs  Lab 11/08/19 0439 11/09/19 0809 11/10/19 0353 11/11/19 0456  WBC 7.9 8.9 10.0 8.5    Liver Function Tests: No results for input(s): AST, ALT, ALKPHOS, BILITOT, PROT, ALBUMIN in the last 168 hours. No results for input(s): LIPASE, AMYLASE in the last 168 hours. No results for input(s): AMMONIA in the last 168 hours.  ABG    Component Value Date/Time   TCO2 29 05/13/2018 2117     Coagulation Profile: No results for input(s): INR, PROTIME in the last 168 hours.  Cardiac Enzymes: No results for input(s): CKTOTAL, CKMB, CKMBINDEX, TROPONINI in the last 168 hours.  HbA1C: Hgb A1c MFr Bld  Date/Time Value Ref Range Status  11/04/2019 03:13 AM 6.2 (H) 4.8 - 5.6 % Final    Comment:    (NOTE) Pre diabetes:          5.7%-6.4% Diabetes:              >6.4% Glycemic control for   <7.0% adults with diabetes     CBG: Recent Labs  Lab 11/11/19 1147 11/11/19 1700 11/11/19 2038 11/12/19 0727 11/12/19 1127  GLUCAP 140* 118* 116* 145* 130*      Signature:    Baltazar Apo, MD, PhD 11/12/2019, 1:31 PM [facility] [phone] or if no answer [phone]

## 2019-11-12 NOTE — H&P (View-Only) (Signed)
NAME:  [name], MRN:  [id number], DOB:  [birth year], LOS: 9 ADMISSION DATE:  11/03/2019, CONSULTATION DATE:  11/06/2019 REFERRING MD:  Antonieta Pert, CHIEF COMPLAINT:  Lung mass    Brief History   79yo presented to ED with complaint of right hip pain post fall with diagnosis of right femoral neck fracture for which orthopedic surgery was consulted. Underwent total hip arthroplasty 5/26. PCCM consulted for management of left lung mass.   History of present illness   [name] is 1 who presented to ED with right hip pain post mechanical fall and was found to have acute femoral neck fracture for which he underwent total hip arthroplasty 5/26.    Additional work-up on admission including CT scan chest revealed 7.1 x 5.9 mediastinal adenopathy.  Per chart review and patient this is a known diagnosis. Per chart review it appears patient has consulted with both  Dr. Roxan Hockey and our pulmonary team including Dr. Valeta Harms regarding biopsy and surgical approaches. Patient states she deferred any further management or work-up including biopsy as she was actively caring for her husband who recently passed away from cancer. Most recent PET scan 06/05/2018.   On evaluation today patient and family request in patient workup of this mass as "I have already pushed this a year, I'd like to get it taken care of now".    Past Medical History  Thyroid disease  ILD IBS Hyperplastic colon polyp Diabetes  Bronchiectasis   Significant Hospital Events   Admitted 5/25  Consults:  Ortho  TRH  Procedures:  Total hip arthroplasty 5/26  Significant Diagnostic Tests:  CT chest 5/26 > 1. Interval development of a 7.1 x 5.9 cm left upper lobe masslike opacity extending into the left hilum. While this could possibly represent pneumonia, imaging features are concerning for neoplasm. 2. Interval development of mediastinal lymphadenopathy. Metastatic disease not excluded. 3. Underlying chronic  interstitial/fibrotic lung disease. 4. Aortic Atherosclerosis (ICD10-I70.0).  Micro Data:  COVID 5/25  Antimicrobials:     Interim history/subjective:   No problems reported.  Comfortable  Objective   Blood pressure (!) 101/59, pulse 88, temperature 98.7 F (37.1 C), temperature source Oral, resp. rate 16, height 5\' 6"  (1.676 m), weight 70.7 kg, SpO2 95 %.        Intake/Output Summary (Last 24 hours) at 11/12/2019 1331 Last data filed at 11/12/2019 4098 Gross per 24 hour  Intake 600 ml  Output 0 ml  Net 600 ml   Filed Weights   11/03/19 2111 11/04/19 1124  Weight: 70.7 kg 70.7 kg    Examination: General: Pleasant woman, laying in bed comfortably HEENT: No oral lesions, pupils equal Neuro: Awake, alert, appropriate, nonfocal CV: Regular, no murmur PULM: Decreased bibasilar breath sounds, some rhonchi on the left, bibasilar inspiratory crackles GI: Nondistended Extremities: No edema Skin: No rash  Resolved Hospital Problem list     Assessment & Plan:  Left lung mass larger compared with prior scan 05/2018 Hx of ILD Hx of bronchiectasis -CT scan chest on this admission revealed enlarging, now 7.1 x 5.9,  mediastinal adenopathy P: Planning for bronchoscopy, possible EBUS if there is not an endobronchial lesion to be biopsied.  Patient understands the pros, cons, potential complications.  All questions answered.  Plan to proceed 6/4 Xarelto on hold  Best practice:  Diet: Carb modified Pain/Anxiety/Delirium protocol (if indicated): As needed VAP protocol (if indicated): Not applicable DVT prophylaxis: Xarelto  GI prophylaxis: PPI Glucose control: SSI Mobility: Up with assistance  Code  Status: Full Family Communication: Discussed with patient 6/3, and children at bedside 6/1 Disposition: Floor   Labs   CBC: Recent Labs  Lab 11/07/19 0318 11/08/19 0439 11/09/19 0809 11/10/19 0353 11/11/19 0456  WBC 8.4 7.9 8.9 10.0 8.5  HGB 7.4* 8.6* 8.7* 8.7* 9.7*  HCT  24.1* 28.8* 27.8* 28.1* 31.6*  MCV 90.3 87.8 87.1 86.5 88.0  PLT 265 281 241 277 580    Basic Metabolic Panel: Recent Labs  Lab 11/06/19 0332 11/07/19 0318 11/08/19 0439 11/10/19 0353 11/11/19 0456  NA 138 138 137 138 136  K 3.4* 4.7 3.8 4.2 4.2  CL 102 104 103 102 101  CO2 28 28 29 27 26   GLUCOSE 128* 118* 134* 142* 139*  BUN 16 13 11 8 9   CREATININE 0.50 0.54 0.39* 0.57 0.73  CALCIUM 8.7* 8.5* 8.6* 8.8* 9.0  MG 1.7  --   --   --   --    GFR: Estimated Creatinine Clearance: 54.3 mL/min (by C-G formula based on SCr of 0.73 mg/dL). Recent Labs  Lab 11/08/19 0439 11/09/19 0809 11/10/19 0353 11/11/19 0456  WBC 7.9 8.9 10.0 8.5    Liver Function Tests: No results for input(s): AST, ALT, ALKPHOS, BILITOT, PROT, ALBUMIN in the last 168 hours. No results for input(s): LIPASE, AMYLASE in the last 168 hours. No results for input(s): AMMONIA in the last 168 hours.  ABG    Component Value Date/Time   TCO2 29 05/13/2018 2117     Coagulation Profile: No results for input(s): INR, PROTIME in the last 168 hours.  Cardiac Enzymes: No results for input(s): CKTOTAL, CKMB, CKMBINDEX, TROPONINI in the last 168 hours.  HbA1C: Hgb A1c MFr Bld  Date/Time Value Ref Range Status  11/04/2019 03:13 AM 6.2 (H) 4.8 - 5.6 % Final    Comment:    (NOTE) Pre diabetes:          5.7%-6.4% Diabetes:              >6.4% Glycemic control for   <7.0% adults with diabetes     CBG: Recent Labs  Lab 11/11/19 1147 11/11/19 1700 11/11/19 2038 11/12/19 0727 11/12/19 1127  GLUCAP 140* 118* 116* 145* 130*      Signature:    Baltazar Apo, MD, PhD 11/12/2019, 1:31 PM [facility] [phone] or if no answer [phone]

## 2019-11-12 NOTE — Progress Notes (Signed)
PROGRESS NOTE    [name]  GXQ:[id number] DOB: [birth year] DOA: 11/03/2019 PCP: [name], MD   Brief Narrative:  79 year old female with significant complex medical history of chronic interstitial lung disease, hypothyroidism, type 2 diabetes mellitus, mediastinal adenopathy, lung mass presented with right hip pain after a fall while getting out of the chair and landing on her right hip on 11/03/2019. In the ED Covid negative, chest x-ray with mild atelectasis with infiltrate within the right lung base and peripheral right upper lobe new mild patchy opacity, and acute fracture of the proximal right femur and underwent right hip replacement by Dr. Alvan Dame on 5/26   Assessment & Plan:   Active Problems:   INTERSTITIAL LUNG DISEASE   Mass of left lung   Type 2 diabetes mellitus with hyperlipidemia (HCC)   Fracture of femoral neck, right (HCC)   Unwitnessed fall   Mediastinal adenopathy   Hypothyroidism   Fall at home Fracture of right femoral neck -status post fall.  Right hip replacement 11/04/2019.  Patient was on Xarelto for DVT prophylaxis which is on hold now for bronc and biopsy 11/13/2019.  PT OT recommending SNF.    Left lung mass:measuring 7.1X 5.9 cm with mediastinal adenopathy, CT chest was done concerning for either pneumonia versus neoplasm with background of chronic interstitial lung disease. Echo showed EF normal grade 1 diastolic dysfunction and moderately elevated pulmonary artery systolic pressure, followed by pulmonology Dr. Jone Baseman discussed biopsy at that time patient wanted to defer due to patient's husband's recent death, patient family wanted biopsy after discussion this hospital stay.  Patient seen by PCCM plan for bronc and biopsy 11/13/2019.  Xarelto on hold for the procedure.   Chronic interstitial lung disease/chronic hypoxic respiratory failure: uses 2 L nasal cannula intermittently at home per family.On room  air.  Deconditioning/fallcontinue PT OT will need a skilled nursing facility.  HypothyroidismTSH normal. Continue Synthroid  T2DMwith hyperlipidemia:Holding home pioglitazone.A1c 6.2 on 5/26. Blood sugar is controlled. Continue sliding scale.   HyperLipidemia continue pravastatin  Anemia from acute blood lossin the setting of Hip surgery- FOBT negative.No obvious blood loss present. Status post 1 unitPRBC HB improved in 8.7 ghas been holding steady. CBC in 1 week.  Sacral bumpx-ray was unremarkable.    Nutrition Problem: Increased nutrient needs Etiology: hip fracture, post-op healing     Signs/Symptoms: estimated needs    Interventions: Ensure Enlive (each supplement provides 350kcal and 20 grams of protein), MVI  Estimated body mass index is 25.16 kg/m as calculated from the following:   Height as of this encounter: 5\' 6"  (1.676 m).   Weight as of this encounter: 70.7 kg.  DVT prophylaxis: doac Code Status: Full code Family Communication: None at bedside Disposition Plan:  Status is: Inpatient    Dispo: The patient is from: Home  Anticipated d/c is to SNF  Anticipated d/c date is unknown  Patient currently is not medically stable to d/c.  Patient is waiting to have bronc and biopsy 11/13/2019 patient has a lung mass.     Consultants:   PCCM and Ortho PCCM were Ortho  Procedures: See above Antimicrobials:     Subjective: Reports she did not get to sleep well last night otherwise no new complaints anxious to have biopsy done tomorrow. Objective: Vitals:   11/10/19 2152 11/11/19 0613 11/11/19 1340 11/12/19 0556  BP: 110/68 115/61 (!) 114/97 (!) 101/59  Pulse: 94 84 93 88  Resp: 18 17 13 16   Temp: 98.5 F (36.9 C)  97.7 F (36.5 C) 98.7 F (37.1 C)  TempSrc: Oral  Oral Oral  SpO2: 96% 96% 94% 95%  Weight:      Height:        Intake/Output Summary (Last 24 hours) at 11/12/2019  1158 Last data filed at 11/12/2019 0918 Gross per 24 hour  Intake 600 ml  Output 0 ml  Net 600 ml   Filed Weights   11/03/19 2111 11/04/19 1124  Weight: 70.7 kg 70.7 kg    Examination:  General exam: Appears calm and comfortable  Respiratory system: Clear to auscultation. Respiratory effort normal. Cardiovascular system: S1 & S2 heard, RRR. No JVD, murmurs, rubs, gallops or clicks. No pedal edema. Gastrointestinal system: Abdomen is nondistended, soft and nontender. No organomegaly or masses felt. Normal bowel sounds heard. Central nervous system: Alert and oriented. No focal neurological deficits. Extremities: Right hip incision clean dry and intact Skin: No rashes, lesions or ulcers Psychiatry: Judgement and insight appear normal. Mood & affect appropriate.     Data Reviewed: I have personally reviewed following labs and imaging studies  CBC: Recent Labs  Lab 11/07/19 0318 11/08/19 0439 11/09/19 0809 11/10/19 0353 11/11/19 0456  WBC 8.4 7.9 8.9 10.0 8.5  HGB 7.4* 8.6* 8.7* 8.7* 9.7*  HCT 24.1* 28.8* 27.8* 28.1* 31.6*  MCV 90.3 87.8 87.1 86.5 88.0  PLT 265 281 241 277 315   Basic Metabolic Panel: Recent Labs  Lab 11/06/19 0332 11/07/19 0318 11/08/19 0439 11/10/19 0353 11/11/19 0456  NA 138 138 137 138 136  K 3.4* 4.7 3.8 4.2 4.2  CL 102 104 103 102 101  CO2 28 28 29 27 26   GLUCOSE 128* 118* 134* 142* 139*  BUN 16 13 11 8 9   CREATININE 0.50 0.54 0.39* 0.57 0.73  CALCIUM 8.7* 8.5* 8.6* 8.8* 9.0  MG 1.7  --   --   --   --    GFR: Estimated Creatinine Clearance: 54.3 mL/min (by C-G formula based on SCr of 0.73 mg/dL). Liver Function Tests: No results for input(s): AST, ALT, ALKPHOS, BILITOT, PROT, ALBUMIN in the last 168 hours. No results for input(s): LIPASE, AMYLASE in the last 168 hours. No results for input(s): AMMONIA in the last 168 hours. Coagulation Profile: No results for input(s): INR, PROTIME in the last 168 hours. Cardiac Enzymes: No results  for input(s): CKTOTAL, CKMB, CKMBINDEX, TROPONINI in the last 168 hours. BNP (last 3 results) No results for input(s): PROBNP in the last 8760 hours. HbA1C: No results for input(s): HGBA1C in the last 72 hours. CBG: Recent Labs  Lab 11/11/19 1147 11/11/19 1700 11/11/19 2038 11/12/19 0727 11/12/19 1127  GLUCAP 140* 118* 116* 145* 130*   Lipid Profile: No results for input(s): CHOL, HDL, LDLCALC, TRIG, CHOLHDL, LDLDIRECT in the last 72 hours. Thyroid Function Tests: No results for input(s): TSH, T4TOTAL, FREET4, T3FREE, THYROIDAB in the last 72 hours. Anemia Panel: No results for input(s): VITAMINB12, FOLATE, FERRITIN, TIBC, IRON, RETICCTPCT in the last 72 hours. Sepsis Labs: No results for input(s): PROCALCITON, LATICACIDVEN in the last 168 hours.  Recent Results (from the past 240 hour(s))  SARS Coronavirus 2 by RT PCR (hospital order, performed in [facility] hospital lab) Nasopharyngeal Nasopharyngeal Swab     Status: None   Collection Time: 11/03/19  7:30 PM   Specimen: Nasopharyngeal Swab  Result Value Ref Range Status   SARS Coronavirus 2 NEGATIVE NEGATIVE Final    Comment: (NOTE) SARS-CoV-2 target nucleic acids are NOT DETECTED. The SARS-CoV-2 RNA is generally  detectable in upper and lower respiratory specimens during the acute phase of infection. The lowest concentration of SARS-CoV-2 viral copies this assay can detect is 250 copies / mL. A negative result does not preclude SARS-CoV-2 infection and should not be used as the sole basis for treatment or other patient management decisions.  A negative result may occur with improper specimen collection / handling, submission of specimen other than nasopharyngeal swab, presence of viral mutation(s) within the areas targeted by this assay, and inadequate number of viral copies (<250 copies / mL). A negative result must be combined with clinical observations, patient history, and epidemiological information. Fact Sheet for  Patients:   StrictlyIdeas.no Fact Sheet for Healthcare Providers: BankingDealers.co.za This test is not yet approved or cleared  by the [place] FDA and has been authorized for detection and/or diagnosis of SARS-CoV-2 by FDA under an Emergency Use Authorization (EUA).  This EUA will remain in effect (meaning this test can be used) for the duration of the COVID-19 declaration under Section 564(b)(1) of the Act, 21 U.S.C. section 360bbb-3(b)(1), unless the authorization is terminated or revoked sooner. Performed at [facility], [place] [address], [place], [place] [address]   Surgical PCR screen     Status: None   Collection Time: 11/04/19  8:50 AM   Specimen: Nasal Mucosa; Nasal Swab  Result Value Ref Range Status   MRSA, PCR NEGATIVE NEGATIVE Final   Staphylococcus aureus NEGATIVE NEGATIVE Final    Comment: (NOTE) The Xpert SA Assay (FDA approved for NASAL specimens in patients 9 years of age and older), is one component of a comprehensive surveillance program. It is not intended to diagnose infection nor to guide or monitor treatment. Performed at [facility], [place] [address] [place], [place] [address]          Radiology Studies: No results found.      Scheduled Meds: . vitamin C  1,000 mg Oral QHS  . celecoxib  200 mg Oral BID  . Chlorhexidine Gluconate Cloth  6 each Topical Daily  . docusate sodium  100 mg Oral BID  . feeding supplement (ENSURE ENLIVE)  237 mL Oral BID BM  . ferrous sulfate  325 mg Oral TID PC  . insulin aspart  0-9 Units Subcutaneous TID WC  . levothyroxine  88 mcg Oral QAC breakfast  . magic mouthwash  5 mL Oral TID  . multivitamin with minerals  1 tablet Oral Daily  . pravastatin  40 mg Oral Daily   Continuous Infusions: . methocarbamol (ROBAXIN) IV       LOS: 9 days    Georgette Shell, MD 11/12/2019, 11:58 AM

## 2019-11-13 ENCOUNTER — Encounter (HOSPITAL_COMMUNITY): Payer: Self-pay | Admitting: Family Medicine

## 2019-11-13 ENCOUNTER — Inpatient Hospital Stay (HOSPITAL_COMMUNITY): Payer: Medicare Other | Admitting: Certified Registered Nurse Anesthetist

## 2019-11-13 ENCOUNTER — Encounter (HOSPITAL_COMMUNITY)
Admission: EM | Disposition: A | Discharge status: Skilled Nursing Facility | Payer: Self-pay | Source: Home / Self Care | Attending: Internal Medicine

## 2019-11-13 HISTORY — PX: VIDEO BRONCHOSCOPY: SHX5072

## 2019-11-13 HISTORY — PX: BRONCHIAL BRUSHINGS: SHX5108

## 2019-11-13 HISTORY — PX: BRONCHIAL BIOPSY: SHX5109

## 2019-11-13 LAB — GLUCOSE, CAPILLARY
Glucose-Capillary: 135 mg/dL — ABNORMAL HIGH (ref 70–99)
Glucose-Capillary: 303 mg/dL — ABNORMAL HIGH (ref 70–99)
Glucose-Capillary: 286 mg/dL — ABNORMAL HIGH (ref 70–99)
Glucose-Capillary: 241 mg/dL — ABNORMAL HIGH (ref 70–99)

## 2019-11-13 LAB — SARS CORONAVIRUS 2 (TAT 6-24 HRS): SARS Coronavirus 2: NEGATIVE

## 2019-11-13 SURGERY — BRONCHOSCOPY, WITH FLUOROSCOPY
Anesthesia: General

## 2019-11-13 MED ORDER — EPHEDRINE SULFATE-NACL 50-0.9 MG/10ML-% IV SOSY
PREFILLED_SYRINGE | INTRAVENOUS | Status: DC | PRN
  Administered 2019-11-13: 10 mg via INTRAVENOUS

## 2019-11-13 MED ORDER — HYDROCODONE-HOMATROPINE 5-1.5 MG/5ML PO SYRP
5.0000 mL | ORAL_SOLUTION | Freq: Four times a day (QID) | ORAL | Status: DC | PRN
  Administered 2019-11-13: 5 mL via ORAL
  Filled 2019-11-13: qty 5

## 2019-11-13 MED ORDER — FENTANYL CITRATE (PF) 100 MCG/2ML IJ SOLN
INTRAMUSCULAR | Status: AC
  Filled 2019-11-13: qty 2

## 2019-11-13 MED ORDER — ONDANSETRON HCL 4 MG/2ML IJ SOLN
INTRAMUSCULAR | Status: DC | PRN
  Administered 2019-11-13: 4 mg via INTRAVENOUS

## 2019-11-13 MED ORDER — SUGAMMADEX SODIUM 200 MG/2ML IV SOLN
INTRAVENOUS | Status: DC | PRN
  Administered 2019-11-13: 200 mg via INTRAVENOUS

## 2019-11-13 MED ORDER — PHENYLEPHRINE 40 MCG/ML (10ML) SYRINGE FOR IV PUSH (FOR BLOOD PRESSURE SUPPORT)
PREFILLED_SYRINGE | INTRAVENOUS | Status: DC | PRN
  Administered 2019-11-13: 80 ug via INTRAVENOUS
  Administered 2019-11-13: 120 ug via INTRAVENOUS
  Administered 2019-11-13: 40 ug via INTRAVENOUS

## 2019-11-13 MED ORDER — LACTATED RINGERS IV SOLN: INTRAVENOUS | Status: DC

## 2019-11-13 MED ORDER — FENTANYL CITRATE (PF) 100 MCG/2ML IJ SOLN
INTRAMUSCULAR | Status: DC | PRN
  Administered 2019-11-13: 50 ug via INTRAVENOUS

## 2019-11-13 MED ORDER — PROPOFOL 500 MG/50ML IV EMUL
INTRAVENOUS | Status: DC | PRN
Start: 2019-11-13 — End: 2019-11-13
  Administered 2019-11-13: 125 ug/kg/min via INTRAVENOUS

## 2019-11-13 MED ORDER — ROCURONIUM BROMIDE 10 MG/ML (PF) SYRINGE
PREFILLED_SYRINGE | INTRAVENOUS | Status: DC | PRN
  Administered 2019-11-13: 50 mg via INTRAVENOUS

## 2019-11-13 MED ORDER — LIDOCAINE 2% (20 MG/ML) 5 ML SYRINGE
INTRAMUSCULAR | Status: DC | PRN
  Administered 2019-11-13: 70 mg via INTRAVENOUS

## 2019-11-13 MED ORDER — PROPOFOL 10 MG/ML IV BOLUS
INTRAVENOUS | Status: DC | PRN
  Administered 2019-11-13: 120 mg via INTRAVENOUS

## 2019-11-13 MED ORDER — DEXAMETHASONE SODIUM PHOSPHATE 10 MG/ML IJ SOLN
INTRAMUSCULAR | Status: DC | PRN
  Administered 2019-11-13: 4 mg via INTRAVENOUS

## 2019-11-13 NOTE — Progress Notes (Signed)
PT Cancellation Note  Patient Details Name: [name] MRN: [id number] DOB: 05-Aug-1940   Cancelled Treatment:    Reason Eval/Treat Not Completed: Patient at procedure or test/unavailable--endoscopy   [facility] 11/13/2019, 9:54 AM

## 2019-11-13 NOTE — Anesthesia Procedure Notes (Addendum)
Procedure Name: Intubation Date/Time: 11/13/2019 9:06 AM Performed by: West Pugh, CRNA Pre-anesthesia Checklist: Patient identified, Emergency Drugs available, Suction available, Patient being monitored and Timeout performed Patient Re-evaluated:Patient Re-evaluated prior to induction Oxygen Delivery Method: Circle system utilized Preoxygenation: Pre-oxygenation with 100% oxygen Induction Type: IV induction Ventilation: Mask ventilation without difficulty Laryngoscope Size: Mac and 3 Grade View: Grade I Tube type: Oral Tube size: 9.0 mm Number of attempts: 1 Airway Equipment and Method: Stylet Placement Confirmation: ETT inserted through vocal cords under direct vision,  positive ETCO2,  CO2 detector and breath sounds checked- equal and bilateral Secured at: 21 cm Tube secured with: Tape Dental Injury: Teeth and Oropharynx as per pre-operative assessment  Comments: AOI performed by Craig Staggers. CRNA and Anesthesiologist present for procedure. 9.20m tube requested by Dr BLamonte Sakai

## 2019-11-13 NOTE — Interval H&P Note (Signed)
PCCM Interval Note  Poor sleep last night. Has a dry mouth from being NPO She asks if she can delay transfer to [facility] until 6/5 - will have to talk to [facility] about this.   Vitals:   11/12/19 1340 11/12/19 2131 11/13/19 0602 11/13/19 0818  BP: 102/64 (!) 122/52 129/66 (!) 134/57  Pulse: 90 93 93 91  Resp: 19 18 18 16   Temp: 98.3 F (36.8 C) 98.2 F (36.8 C) 98 F (36.7 C) (!) 97.4 F (36.3 C)  TempSrc: Oral Oral Oral Temporal  SpO2: 99% 98% 100% 98%  Weight:    70.7 kg  Height:    5\' 6"  (1.676 m)  Well appearing elderly woman on RA.  Mouth is dry. Lungs coarse with decrease BS on L.  RRR, no M. No edema.   Impression / Plan: FOB with possible EBUS depending on whether we are able to access an endobronchial lesion.  Patient understands the procedure, risks, benefits and agrees with the plan.  No barriers identified.   Baltazar Apo, MD, PhD 11/13/2019, 8:46 AM [facility] [phone] or if no answer [phone]

## 2019-11-13 NOTE — Anesthesia Preprocedure Evaluation (Addendum)
Anesthesia Evaluation  Patient identified by MRN, date of birth, ID band Patient awake    Reviewed: Allergy & Precautions, NPO status , Patient's Chart, lab work & pertinent test results  Airway Mallampati: II  TM Distance: >3 FB Neck ROM: Full    Dental  (+) Edentulous Upper, Dental Advisory Given   Pulmonary former smoker,    breath sounds clear to auscultation       Cardiovascular hypertension, + CAD   Rhythm:Regular Rate:Normal     Neuro/Psych PSYCHIATRIC DISORDERS Depression negative neurological ROS     GI/Hepatic Neg liver ROS, GERD  ,  Endo/Other  diabetesHypothyroidism   Renal/GU negative Renal ROS     Musculoskeletal negative musculoskeletal ROS (+)   Abdominal Normal abdominal exam  (+)   Peds  Hematology negative hematology ROS (+)   Anesthesia Other Findings   Reproductive/Obstetrics                            Anesthesia Physical Anesthesia Plan  ASA: II  Anesthesia Plan: General   Post-op Pain Management:    Induction: Intravenous  PONV Risk Score and Plan: Ondansetron and Treatment may vary due to age or medical condition  Airway Management Planned: Oral ETT  Additional Equipment: None  Intra-op Plan:   Post-operative Plan: Extubation in OR  Informed Consent: I have reviewed the patients History and Physical, chart, labs and discussed the procedure including the risks, benefits and alternatives for the proposed anesthesia with the patient or authorized representative who has indicated his/her understanding and acceptance.     Dental advisory given  Plan Discussed with: CRNA  Anesthesia Plan Comments:        Anesthesia Quick Evaluation

## 2019-11-13 NOTE — TOC Progression Note (Signed)
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 [phone] for [place] 5 Days starting 6/4-6/8 Pending can discharge 6/5 pending negative covid test.    Expected Discharge Plan: Skilled Nursing Facility Barriers to Discharge: Other (comment)(Covid test)  Expected Discharge Plan and Services Expected Discharge Plan: [place]   Discharge Planning Services: CM Consult                                           Social Determinants of Health (SDOH) Interventions    Readmission Risk Interventions No flowsheet data found.

## 2019-11-13 NOTE — Anesthesia Postprocedure Evaluation (Signed)
Anesthesia Post Note  Patient: [name]  Procedure(s) Performed: VIDEO BRONCHOSCOPY and biopsy (N/A )     Patient location during evaluation: PACU Anesthesia Type: General Level of consciousness: awake and alert Pain management: pain level controlled Vital Signs Assessment: post-procedure vital signs reviewed and stable Respiratory status: spontaneous breathing, nonlabored ventilation, respiratory function stable and patient connected to nasal cannula oxygen Cardiovascular status: blood pressure returned to baseline and stable Postop Assessment: no apparent nausea or vomiting Anesthetic complications: no    Last Vitals:  Vitals:   11/13/19 1020 11/13/19 1049  BP: 115/75 118/80  Pulse: 97 95  Resp: (!) 22 20  Temp:  36.7 C  SpO2: 95% 100%    Last Pain:  Vitals:   11/13/19 1049  TempSrc: Oral  PainSc:                  Effie Berkshire

## 2019-11-13 NOTE — Progress Notes (Signed)
Inpatient Diabetes Program Recommendations  AACE/ADA: New Consensus Statement on Inpatient Glycemic Control (2015)  Target Ranges:  Prepandial:   less than 140 mg/dL      Peak postprandial:   less than 180 mg/dL (1-2 hours)      Critically ill patients:  140 - 180 mg/dL   Lab Results  Component Value Date   GLUCAP 303 (H) 11/13/2019   HGBA1C 6.2 (H) 11/04/2019    Review of Glycemic Control  Diabetes history: Dm 2 Outpatient Diabetes medications: Actos 15 mg Daily Current orders for Inpatient glycemic control: Novolog 0-9 units tid   Ensure bid between meals  Inpatient Diabetes Program Recommendations:    Received decadron 4 mg.   Increase Novolog Correction to 0-15 units Q4 hours until tomorrow then change to ACHS.  Thanks,  Tama Headings RN, MSN, BC-ADM Inpatient Diabetes Coordinator Team Pager [phone] (8a-5p)

## 2019-11-13 NOTE — Op Note (Signed)
[facility] Cardiopulmonary Patient Name: [name] Procedure Date: 11/13/2019 MRN: [id number] Attending MD: Collene Gobble , MD Date of Birth: [birth year] CSN: [id number] Age: 79 Admit Type: Inpatient Ethnicity: Not Hispanic or Latino Procedure:             Bronchoscopy Indications:           Left upper lobe mass Providers:             Collene Gobble, MD, Yehuda Mao,                         Technician Referring MD:           Medicines:             General Anesthesia Complications:         No immediate complications Estimated Blood Loss:  Estimated blood loss was minimal. Procedure:      Pre-Anesthesia Assessment:      - A History and Physical has been performed. Patient meds and allergies       have been reviewed. The risks and benefits of the procedure and the       sedation options and risks were discussed with the patient. All       questions were answered and informed consent was obtained. Patient       identification and proposed procedure were verified prior to the       procedure by the physician in the pre-procedure area. Mental Status       Examination: normal. Airway Examination: normal oropharyngeal airway.       Respiratory Examination: bibasilar crackles. CV Examination: normal. ASA       Grade Assessment: III - A patient with severe systemic disease. After       reviewing the risks and benefits, the patient was deemed in satisfactory       condition to undergo the procedure. The anesthesia plan was to use       general anesthesia. Immediately prior to administration of medications,       the patient was re-assessed for adequacy to receive sedatives. The heart       rate, respiratory rate, oxygen saturations, blood pressure, adequacy of       pulmonary ventilation, and response to care were monitored throughout       the procedure. The physical status of the patient was re-assessed after       the procedure.      After obtaining  informed consent, the bronchoscope was passed under       direct vision. Throughout the procedure, the patient's blood pressure,       pulse, and oxygen saturations were monitored continuously. the [id number]       ([id number]) Olympus EBUS was introduced through the mouth, via the       endotracheal tube and advanced to the tracheobronchial tree. the       [id number] ([id number]) Olympus therapeutic bronchoscope was introduced       through the and advanced to the. The procedure was accomplished without       difficulty. The patient tolerated the procedure well. Findings:      The trachea is of normal caliber. The carina is sharp. The       tracheobronchial tree of the right lung was examined to at least the       first subsegmental level. Bronchial mucosa and anatomy in the right lung  are normal; there are no endobronchial lesions, and no secretions.      Left Lung Abnormalities: A partially obstructing (about 90% obstructed)       mass was found in the left upper lobe, occluding 2 subsegments, small       amount of blood. The mass was endobronchial and friable.      Brushings of a mass were obtained in the apical-posterior segment of the       left upper lobe with a cytology brush and sent for routine cytology.       Three samples were obtained.      Endobronchial biopsies of a mass were performed in the apical-posterior       segment of the left upper lobe using a forceps and sent for       histopathology examination. Five samples were obtained. Impression:      - Left upper lobe mass      - The airway examination of the right lung was normal.      - An endobronchial and friable mass was found in the left upper lobe.       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      - Brushings were obtained.      - Endobronchial biopsies were performed. Moderate Sedation:      Performed under general anesthesia Recommendation:      - Await biopsy and brushing results. Procedure Code(s):      ---  Professional ---      [phone], Bronchoscopy, rigid or flexible, including fluoroscopic guidance,       when performed; with bronchial or endobronchial biopsy(s), single or       multiple sites      31623, Bronchoscopy, rigid or flexible, including fluoroscopic guidance,       when performed; with brushing or protected brushings Diagnosis Code(s):      --- Professional ---      R91.8, Other nonspecific abnormal finding of lung field      J98.9, Respiratory disorder, unspecified CPT copyright 2019 American Medical Association. All rights reserved. The codes documented in this report are preliminary and upon coder review may  be revised to meet current compliance requirements. Collene Gobble, MD Collene Gobble, MD 11/13/2019 10:08:29 AM Number of Addenda: 0 Scope In: Scope Out:

## 2019-11-13 NOTE — Transfer of Care (Signed)
Immediate Anesthesia Transfer of Care Note  Patient: [name]  Procedure(s) Performed: VIDEO BRONCHOSCOPY and biopsy (N/A )  Patient Location: PACU  Anesthesia Type:General  Level of Consciousness: awake, alert , oriented and patient cooperative  Airway & Oxygen Therapy: Patient Spontanous Breathing and Patient connected to face mask oxygen  Post-op Assessment: Report given to RN and Post -op Vital signs reviewed and stable  Post vital signs: Reviewed and stable  Last Vitals:  Vitals Value Taken Time  BP 161/47 11/13/19 1006  Temp    Pulse 86 11/13/19 1008  Resp 23 11/13/19 1008  SpO2 100 % 11/13/19 1008  Vitals shown include unvalidated device data.  Last Pain:  Vitals:   11/13/19 1006  TempSrc:   PainSc: 0-No pain      Patients Stated Pain Goal: 3 (02/54/27 0623)  Complications: No apparent anesthesia complications

## 2019-11-13 NOTE — Progress Notes (Signed)
PROGRESS NOTE    [name]  XBJ:[id number] DOB: [birth year] DOA: 11/03/2019 PCP: [name], MD    Brief Narrative: 79 year old female with significant complex medical history of chronic interstitial lung disease, hypothyroidism, type 2 diabetes mellitus, mediastinal adenopathy, lung mass presented with right hip pain after a fall while getting out of the chair and landing on her right hip on 11/03/2019. In the ED Covid negative, chest x-ray with mild atelectasis with infiltrate within the right lung base and peripheral right upper lobe new mild patchy opacity, and acute fracture of the proximal right femur and underwent right hip replacement by Dr. Alvan Dame on 5/26  Assessment & Plan:   Principal Problem:   Mass of left lung Active Problems:   INTERSTITIAL LUNG DISEASE   Type 2 diabetes mellitus with hyperlipidemia (HCC)   Fracture of femoral neck, right (HCC)   Unwitnessed fall   Mediastinal adenopathy   Hypothyroidism   Fall at home   Fracture of right femoral neck-status post fall. Right hip replacement 11/04/2019. Patient was on Xarelto for DVT prophylaxis which is on hold now for bronc and biopsy 11/13/2019. PT OT recommending SNF.   Left lung mass:measuring 7.1X 5.9 cm with mediastinal adenopathy, CT chest was done concerning for either pneumonia versus neoplasm with background of chronic interstitial lung disease. Echo showed EF normal grade 1 diastolic dysfunction and moderately elevated pulmonary artery systolic pressure, followed by pulmonology Dr. Jone Baseman discussed biopsy at that time patient wanted to defer due to patient's husband's recent death, patient family wanted biopsy after discussion this hospital stay.  Patient seen by PCCM plan for bronc and biopsy 11/13/2019. Xarelto on hold for the procedure.  Status post biopsy 11/13/2019. Authorization in progress for SNF bed.  Plan discharge 11/14/2019.  Status post bronc and biopsy 11/14/2019.  Chronic  interstitial lung disease/chronic hypoxic respiratory failure: uses 2 L nasal cannula intermittently at home per family.On room air.  Deconditioning/fallcontinue PT OT will need a skilled nursing facility.  HypothyroidismTSH normal. Continue Synthroid  T2DMwith hyperlipidemia:Holding home pioglitazone.A1c 6.2 on 5/26.  CBG (last 3)  Recent Labs    11/12/19 2134 11/13/19 0755 11/13/19 1205  GLUCAP 173* 135* 303*     HyperLipidemia continue pravastatin  Anemia from acute blood lossin the setting of Hip surgery- FOBT negative.No obvious blood loss present. Status post 1 unitPRBC HB improved in 8.7 ghas been holding steady. CBC in 1 week.  Sacral bumpx-ray was unremarkable. Nutrition Problem: Increased nutrient needs Etiology: hip fracture, post-op healing     Signs/Symptoms: estimated needs    Interventions: Ensure Enlive (each supplement provides 350kcal and 20 grams of protein), MVI  Estimated body mass index is 25.16 kg/m as calculated from the following:   Height as of this encounter: 5\' 6"  (1.676 m).   Weight as of this encounter: 70.7 kg.  DVT prophylaxis:doac Code Status:Full code Family Communication:None at bedside Disposition Plan:Status is: Inpatient    Dispo: The patient is from:Home Anticipated d/c is toSNF Anticipated d/c date is6/5  Patient currentlyis not medically stable to d/c.Patient is waiting to have bronc and biopsy 11/13/2019 patient has a lung mass.    Consultants:  PCCM and Ortho PCCM were Ortho  Procedures:See above Antimicrobials: None  Subjective:  Patient resting in bed I saw her prior to her biopsy Objective: Vitals:   11/13/19 0818 11/13/19 1006 11/13/19 1020 11/13/19 1049  BP: (!) 134/57 (!) 161/47 115/75 118/80  Pulse: 91 84 97 95  Resp: 16 19 (!) 22 20  Temp: (!) 97.4 F (36.3 C) (!) 95.4 F (35.2 C)  98 F (36.7 C)  TempSrc: Temporal  Temporal  Oral  SpO2: 98% 100% 95% 100%  Weight: 70.7 kg     Height: 5\' 6"  (1.676 m)       Intake/Output Summary (Last 24 hours) at 11/13/2019 1331 Last data filed at 11/13/2019 1008 Gross per 24 hour  Intake 940 ml  Output 0 ml  Net 940 ml   Filed Weights   11/03/19 2111 11/04/19 1124 11/13/19 0818  Weight: 70.7 kg 70.7 kg 70.7 kg    Examination:  General exam: Appears calm and comfortable  Respiratory system: Clear to auscultation. Respiratory effort normal. Cardiovascular system: S1 & S2 heard, RRR. No JVD, murmurs, rubs, gallops or clicks. No pedal edema. Gastrointestinal system: Abdomen is nondistended, soft and nontender. No organomegaly or masses felt. Normal bowel sounds heard. Central nervous system: Alert and oriented. No focal neurological deficits. Extremities: Symmetric 5 x 5 power. Skin: No rashes, lesions or ulcers Psychiatry: Judgement and insight appear normal. Mood & affect appropriate.     Data Reviewed: I have personally reviewed following labs and imaging studies  CBC: Recent Labs  Lab 11/07/19 0318 11/08/19 0439 11/09/19 0809 11/10/19 0353 11/11/19 0456  WBC 8.4 7.9 8.9 10.0 8.5  HGB 7.4* 8.6* 8.7* 8.7* 9.7*  HCT 24.1* 28.8* 27.8* 28.1* 31.6*  MCV 90.3 87.8 87.1 86.5 88.0  PLT 265 281 241 277 811   Basic Metabolic Panel: Recent Labs  Lab 11/07/19 0318 11/08/19 0439 11/10/19 0353 11/11/19 0456  NA 138 137 138 136  K 4.7 3.8 4.2 4.2  CL 104 103 102 101  CO2 28 29 27 26   GLUCOSE 118* 134* 142* 139*  BUN 13 11 8 9   CREATININE 0.54 0.39* 0.57 0.73  CALCIUM 8.5* 8.6* 8.8* 9.0   GFR: Estimated Creatinine Clearance: 54.3 mL/min (by C-G formula based on SCr of 0.73 mg/dL). Liver Function Tests: No results for input(s): AST, ALT, ALKPHOS, BILITOT, PROT, ALBUMIN in the last 168 hours. No results for input(s): LIPASE, AMYLASE in the last 168 hours. No results for input(s): AMMONIA in the last 168 hours. Coagulation Profile: No results for  input(s): INR, PROTIME in the last 168 hours. Cardiac Enzymes: No results for input(s): CKTOTAL, CKMB, CKMBINDEX, TROPONINI in the last 168 hours. BNP (last 3 results) No results for input(s): PROBNP in the last 8760 hours. HbA1C: No results for input(s): HGBA1C in the last 72 hours. CBG: Recent Labs  Lab 11/12/19 1127 11/12/19 1724 11/12/19 2134 11/13/19 0755 11/13/19 1205  GLUCAP 130* 138* 173* 135* 303*   Lipid Profile: No results for input(s): CHOL, HDL, LDLCALC, TRIG, CHOLHDL, LDLDIRECT in the last 72 hours. Thyroid Function Tests: No results for input(s): TSH, T4TOTAL, FREET4, T3FREE, THYROIDAB in the last 72 hours. Anemia Panel: No results for input(s): VITAMINB12, FOLATE, FERRITIN, TIBC, IRON, RETICCTPCT in the last 72 hours. Sepsis Labs: No results for input(s): PROCALCITON, LATICACIDVEN in the last 168 hours.  Recent Results (from the past 240 hour(s))  SARS Coronavirus 2 by RT PCR (hospital order, performed in [facility] hospital lab) Nasopharyngeal Nasopharyngeal Swab     Status: None   Collection Time: 11/03/19  7:30 PM   Specimen: Nasopharyngeal Swab  Result Value Ref Range Status   SARS Coronavirus 2 NEGATIVE NEGATIVE Final    Comment: (NOTE) SARS-CoV-2 target nucleic acids are NOT DETECTED. The SARS-CoV-2 RNA is generally detectable in upper and lower respiratory specimens during the acute phase  of infection. The lowest concentration of SARS-CoV-2 viral copies this assay can detect is 250 copies / mL. A negative result does not preclude SARS-CoV-2 infection and should not be used as the sole basis for treatment or other patient management decisions.  A negative result may occur with improper specimen collection / handling, submission of specimen other than nasopharyngeal swab, presence of viral mutation(s) within the areas targeted by this assay, and inadequate number of viral copies (<250 copies / mL). A negative result must be combined with  clinical observations, patient history, and epidemiological information. Fact Sheet for Patients:   StrictlyIdeas.no Fact Sheet for Healthcare Providers: BankingDealers.co.za This test is not yet approved or cleared  by the [place] FDA and has been authorized for detection and/or diagnosis of SARS-CoV-2 by FDA under an Emergency Use Authorization (EUA).  This EUA will remain in effect (meaning this test can be used) for the duration of the COVID-19 declaration under Section 564(b)(1) of the Act, 21 U.S.C. section 360bbb-3(b)(1), unless the authorization is terminated or revoked sooner. Performed at [facility], [place] [address], [place], [place] [address]   Surgical PCR screen     Status: None   Collection Time: 11/04/19  8:50 AM   Specimen: Nasal Mucosa; Nasal Swab  Result Value Ref Range Status   MRSA, PCR NEGATIVE NEGATIVE Final   Staphylococcus aureus NEGATIVE NEGATIVE Final    Comment: (NOTE) The Xpert SA Assay (FDA approved for NASAL specimens in patients 2 years of age and older), is one component of a comprehensive surveillance program. It is not intended to diagnose infection nor to guide or monitor treatment. Performed at [facility], [place] [address], [place], [place] [address]          Radiology Studies: No results found.      Scheduled Meds: . vitamin C  1,000 mg Oral QHS  . celecoxib  200 mg Oral BID  . Chlorhexidine Gluconate Cloth  6 each Topical Daily  . docusate sodium  100 mg Oral BID  . feeding supplement (ENSURE ENLIVE)  237 mL Oral BID BM  . ferrous sulfate  325 mg Oral TID PC  . insulin aspart  0-9 Units Subcutaneous TID WC  . levothyroxine  88 mcg Oral QAC breakfast  . magic mouthwash  5 mL Oral TID  . multivitamin with minerals  1 tablet Oral Daily  . pravastatin  40 mg Oral Daily   Continuous Infusions: . methocarbamol (ROBAXIN) IV       LOS:  10 days     Georgette Shell, MD 11/13/2019, 1:31 PM

## 2019-11-13 NOTE — Progress Notes (Signed)
Occupational Therapy Treatment Patient Details Name: [name] MRN: [id number] DOB: 1940/09/26 Today's Date: 11/13/2019    History of present illness R hip fx, s/p AA-THA on 11/04/19; PMH of DM, interstitial lung disease (uses 2L O2 prn at baseline)CT scan chest revealed 7.1 x 5.9 mediastinal adenopathy(known dx per chart)   OT comments  Treatment focused on improving patient's activity tolerance. Patient able to ambulate multiple distances but needed sitting rest break to recover. Oxygen sat dropping to 87% but recovering on RA with rest break. See note for details.   Follow Up Recommendations  SNF    Equipment Recommendations  Other (comment)    Recommendations for Other Services      Precautions / Restrictions Precautions Precautions: Fall Restrictions Weight Bearing Restrictions: No RLE Weight Bearing: Weight bearing as tolerated       Mobility Bed Mobility         Supine to sit: Supervision;HOB elevated        Transfers     Transfers: Sit to/from Stand Sit to Stand: Supervision         General transfer comment: Patient ambulated into bathroom with RW, performed toileting and returned to side of bed. O2 sat 88% and patient reports needing oxygen to recover. Patient ambulated 30 feet on RA - o2 sat 91%. Patient performed sit to stand x 3 on RA and oxygen dropped to 87% but recovered on RA. Patient ambulated 100 feet with RW and oxygen 89% and patient reporting significant fatigue. Patient required sitting rest break to recover between each bout of activity.    Balance                                           ADL either performed or assessed with clinical judgement   ADL                           Toilet Transfer: Supervision/safety;Regular Toilet;Ambulation;RW   Toileting- Clothing Manipulation and Hygiene: Supervision/safety;Sit to/from stand               Vision       Perception     Praxis      Cognition  Arousal/Alertness: Awake/alert Behavior During Therapy: WFL for tasks assessed/performed Overall Cognitive Status: Within Functional Limits for tasks assessed                                          Exercises     Shoulder Instructions       General Comments      Pertinent Vitals/ Pain       Pain Assessment: No/denies pain  Home Living                                          Prior Functioning/Environment              Frequency  Min 2X/week        Progress Toward Goals  OT Goals(current goals can now be found in the care plan section)        Plan Discharge plan remains appropriate    Co-evaluation  AM-PAC OT "6 Clicks" Daily Activity     Outcome Measure                    End of Session Equipment Utilized During Treatment: Rolling walker;Oxygen;Gait belt  OT Visit Diagnosis: Other abnormalities of gait and mobility (R26.89);History of falling (Z91.81);Pain   Activity Tolerance Patient tolerated treatment well   Patient Left in chair;with call bell/phone within reach;with family/visitor present   Nurse Communication Mobility status        Time: 4599-7741 OT Time Calculation (min): 21 min  Charges: OT General Charges $OT Visit: 1 Visit OT Treatments $Therapeutic Activity: 8-22 mins  Derl Barrow, OTR/L [facility]  Office [phone]     Lenward Chancellor 11/13/2019, 4:34 PM

## 2019-11-13 NOTE — TOC Progression Note (Signed)
Transition of Care [facility]) - Progression Note    Patient Details  Name: [name] MRN: [id number] Date of Birth: May 05, 1941  Transition of Care [facility]) CM/SW [place], [place] Phone Number: 11/13/2019, 1:30 PM  Clinical Narrative:    SNF [facility]: [facility] Ref# [id number] pending approval.  CSW provided an update to the patient and children at bedside.   Expected Discharge Plan: Skilled Nursing Facility Barriers to Discharge: Ship broker  Expected Discharge Plan and Services Expected Discharge Plan: [facility]   Discharge Planning Services: CM Consult                                           Social Determinants of Health (SDOH) Interventions    Readmission Risk Interventions No flowsheet data found.

## 2019-11-14 ENCOUNTER — Other Ambulatory Visit: Payer: Self-pay | Admitting: Emergency Medicine

## 2019-11-14 DIAGNOSIS — R918 Other nonspecific abnormal finding of lung field: Secondary | ICD-10-CM

## 2019-11-14 LAB — GLUCOSE, CAPILLARY
Glucose-Capillary: 200 mg/dL — ABNORMAL HIGH (ref 70–99)
Glucose-Capillary: 170 mg/dL — ABNORMAL HIGH (ref 70–99)

## 2019-11-14 MED ORDER — CLORAZEPATE DIPOTASSIUM 3.75 MG PO TABS: ORAL_TABLET | ORAL | 1 refills | Status: AC

## 2019-11-14 MED ORDER — CALCIUM CARBONATE ANTACID 500 MG PO CHEW
1.0000 | CHEWABLE_TABLET | Freq: Three times a day (TID) | ORAL | Status: DC
  Administered 2019-11-14: 200 mg via ORAL
  Filled 2019-11-14: qty 1

## 2019-11-14 MED ORDER — VERAPAMIL HCL 120 MG PO TABS: 60.0000 mg | ORAL_TABLET | Freq: Two times a day (BID) | ORAL | 2 refills | Status: DC

## 2019-11-14 MED ORDER — PHENOL 1.4 % MT LIQD: 1.0000 | OROMUCOSAL | 0 refills | Status: DC | PRN

## 2019-11-14 MED ORDER — CALCIUM CARBONATE ANTACID 500 MG PO CHEW: 1.0000 | CHEWABLE_TABLET | Freq: Three times a day (TID) | ORAL | Status: DC

## 2019-11-14 MED ORDER — MENTHOL 3 MG MT LOZG: 1.0000 | LOZENGE | OROMUCOSAL | 12 refills | Status: DC | PRN

## 2019-11-14 MED ORDER — HYDROCODONE-ACETAMINOPHEN 5-325 MG PO TABS: 1.0000 | ORAL_TABLET | Freq: Four times a day (QID) | ORAL | 0 refills | Status: DC | PRN

## 2019-11-14 MED ORDER — POLYVINYL ALCOHOL 1.4 % OP SOLN: 1.0000 [drp] | Freq: Three times a day (TID) | OPHTHALMIC | 0 refills | Status: DC | PRN

## 2019-11-14 NOTE — Progress Notes (Addendum)
CSW received a call from pt's RN Larene Beach at ph: [phone] who states pt is ready for D/C and is to [facility] via [facility].  CSW will continue to follow for D/C needs.  Alphonse Guild. Ching Rabideau  MSW, LCSW, LCAS, CCS Transitions of Care Clinical Social Worker Care Coordination Department Ph: [phone]

## 2019-11-14 NOTE — Progress Notes (Signed)
Referral to [facility] for new dx L [facility]  Baltazar Apo, MD, PhD 11/14/2019, 8:57 AM [facility] [phone] or if no answer [phone]

## 2019-11-14 NOTE — Progress Notes (Signed)
CSW received a call from [place] at [facility] stating the patient has been offered a bed and has been accepted and that the pt can arrive on 11/14/19 after 2:30 pm.  The pt's accepting doctor is the SNF MD.  The room number will be 1201.  The number for report is [phone].  CSW will update RN who will update EDP.  Alphonse Guild. Porcia Morganti MSW, LCSW, [place], CCS Clinical Social Worker Ph: [phone]

## 2019-11-14 NOTE — Progress Notes (Signed)
[facility] called via dispatch and voiced uderstanding pt can not go to [facility] until after 2:30pm.  CSW to bring D/C packet to pt's chart now.  RN updated.  CSW will continue to follow for D/C needs.  Alphonse Guild. Kalila Adkison  MSW, LCSW, LCAS, CCS Transitions of Care Clinical Social Worker Care Coordination Department Ph: [phone]

## 2019-11-14 NOTE — Progress Notes (Signed)
Pt was transferred to rehab facility via ambulance.

## 2019-11-14 NOTE — Discharge Summary (Signed)
Physician Discharge Summary  [name] MPN:[id number] DOB: 1940/10/18 DOA: 11/03/2019  PCP: [name], MD  Admit date: 11/03/2019 Discharge date: 11/14/2019  Admitted From: Home Disposition: Nursing home  recommendations for Outpatient Follow-up:  1. Follow up with PCP in 1-2 weeks 2. Please obtain BMP/CBC in one week 3. Please follow up at the multidisciplinary thoracic oncology clinic, referral was made by Dr. Lamonte Sakai on discharge.  Patient has a new diagnosis of left non-small cell lung cancer.  Home Health: None Equipment/Devices none  Discharge Condition stable CODE STATUS: Full code Diet recommendation: Cardiac diet Brief/Interim Summary:79 year old female with significant complex medical history of chronic interstitial lung disease, hypothyroidism, type 2 diabetes mellitus, mediastinal adenopathy, lung mass presented with right hip pain after a fall while getting out of the chair and landing on her right hip on 11/03/2019. In the ED Covid negative, chest x-ray with mild atelectasis with infiltrate within the right lung base and peripheral right upper lobe new mild patchy opacity, and acute fracture of the proximal right femur and underwent right hip replacement by Dr. Alvan Dame on 5/26   Discharge Diagnoses:  Principal Problem:   Mass of left lung Active Problems:   INTERSTITIAL LUNG DISEASE   Type 2 diabetes mellitus with hyperlipidemia (HCC)   Fracture of femoral neck, right (HCC)   Unwitnessed fall   Mediastinal adenopathy   Hypothyroidism   Fall at home     Fracture of right femoral neck-status post fall.  Patient had right hip replacement by Dr. Alvan Dame on 11/04/2019.  She was seen by physical therapy who recommended skilled nursing facility to continue rehabilitation.  She was Covid negative on 11/13/2019.   Left lung mass:measuring 7.1X 5.9 cm with mediastinal adenopathy, CT chest was done concerning for either pneumonia versus neoplasm with background of chronic  interstitial lung disease. Patient had bronchoscopy by Dr. Lamonte Sakai on 11/13/2019. Patient has a new diagnosis of left non-small cell lung cancer. Findings left upper lobe mass, airway examination of the right lung was normal, endobronchial and friable mass was found in the left upper lobe brushings obtained endobronchial biopsies performed.  Echo showed EF normal grade 1 diastolic dysfunction and moderately elevated pulmonary artery systolic pressure, followed by pulmonology Dr. [name]  Chronic interstitial lung disease/chronic hypoxic respiratory failure: uses 2 L nasal cannula intermittently at home per family.  Deconditioning/fallcontinue PT OT discharge to SNF.    HypothyroidismTSH normal. Continue Synthroid  T2DMwith hyperlipidemia: Continue Actos.  Her A1c was 6.2 on 11/04/2019.   HyperLipidemia continue pravastatin  Anemia from acute blood lossin the setting of Hip surgery- FOBT negative.No obvious blood loss present. Status post 1 unitPRBC HB improved in 8.7 ghas been holding steady. CBC in 1 week.  Sacral bumpx-ray was unremarkable.  Nutrition Problem: Increased nutrient needs Etiology: hip fracture, post-op healing    Signs/Symptoms: estimated needs     Interventions: Ensure Enlive (each supplement provides 350kcal and 20 grams of protein), MVI  Estimated body mass index is 25.16 kg/m as calculated from the following:   Height as of this encounter: 5\' 6"  (1.676 m).   Weight as of this encounter: 70.7 kg.  Discharge Instructions  Discharge Instructions    Call MD for:  difficulty breathing, headache or visual disturbances   Complete by: As directed    Call MD for:  persistant nausea and vomiting   Complete by: As directed    Call MD for:  temperature >100.4   Complete by: As directed    Diet - low  sodium heart healthy   Complete by: As directed    Increase activity slowly   Complete by: As directed    No wound care   Complete by: As  directed      Allergies as of 11/14/2019      Reactions   Penicillins Diarrhea   Has patient had a PCN reaction causing immediate rash, facial/tongue/throat swelling, SOB or lightheadedness with hypotension: Unknown Has patient had a PCN reaction causing severe rash involving mucus membranes or skin necrosis: Unknown Has patient had a PCN reaction that required hospitalization: No  Has patient had a PCN reaction occurring within the last 10 years: No  If all of the above answers are "NO", then may proceed with Cephalosporin use.   Influenza Vaccines Other (See Comments)   Severe nausea, vomiting and body aches-md told her not to take it anymore   Clarithromycin    Unknown reaction    Cortisone Other (See Comments)   "Made her feel like ice water was running through her veins"    Povidone-iodine    Unknown reaction       Medication List    STOP taking these medications   acetaminophen 500 MG tablet Commonly known as: TYLENOL   HYDROcodone-homatropine 5-1.5 MG/5ML syrup Commonly known as: HYCODAN     TAKE these medications   benzonatate 100 MG capsule Commonly known as: TESSALON TAKE 1 CAPSULE (100 MG TOTAL) BY MOUTH EVERY 8 (EIGHT) HOURS AS NEEDED FOR COUGH.   Biotin 10000 MCG Tabs Take 10,000 mcg by mouth daily.   CALCIUM 1200+D3 PO Take 1 tablet by mouth at bedtime.   celecoxib 200 MG capsule Commonly known as: CELEBREX Take 1 capsule (200 mg total) by mouth 2 (two) times daily.   clorazepate 3.75 MG tablet Commonly known as: TRANXENE TAKE 1 TABLET BY MOUTH DAILY.MAY TAKE UP TO 2 DAILY IF NEEDED What changed:   how much to take  how to take this  when to take this  additional instructions   cyclobenzaprine 10 MG tablet Commonly known as: FLEXERIL Take 10 mg by mouth at bedtime.   docusate sodium 100 MG capsule Commonly known as: COLACE Take 1 capsule (100 mg total) by mouth 2 (two) times daily.   ferrous sulfate 325 (65 FE) MG tablet Take 1 tablet  (325 mg total) by mouth 3 (three) times daily after meals for 14 days.   HYDROcodone-acetaminophen 5-325 MG tablet Commonly known as: NORCO/VICODIN Take 1-2 tablets by mouth every 6 (six) hours as needed for moderate pain or severe pain (pain score 4-6).   levothyroxine 88 MCG tablet Commonly known as: SYNTHROID Take 88 mcg by mouth daily before breakfast.   menthol-cetylpyridinium 3 MG lozenge Commonly known as: CEPACOL Take 1 lozenge (3 mg total) by mouth as needed for sore throat (sore throat).   multivitamin tablet Take 1 tablet by mouth daily.   multivitamin with minerals Tabs tablet Take 1 tablet by mouth daily.   phenol 1.4 % Liqd Commonly known as: CHLORASEPTIC Use as directed 1 spray in the mouth or throat as needed for throat irritation / pain.   pioglitazone 15 MG tablet Commonly known as: ACTOS Take 15 mg by mouth daily.   polyvinyl alcohol 1.4 % ophthalmic solution Commonly known as: LIQUIFILM TEARS Place 1 drop into the left eye 3 (three) times daily as needed for dry eyes.   pravastatin 40 MG tablet Commonly known as: PRAVACHOL TAKE 1 TABLET BY MOUTH EVERY DAY IN THE EVENING What  changed: See the new instructions.   rivaroxaban 10 MG Tabs tablet Commonly known as: XARELTO Take 1 tablet (10 mg total) by mouth daily for 21 days.   verapamil 120 MG tablet Commonly known as: CALAN Take 0.5 tablets (60 mg total) by mouth 2 (two) times daily.   vitamin C 1000 MG tablet Take 1,000 mg by mouth at bedtime.       Contact information for follow-up providers    Paralee Cancel, MD. Schedule an appointment as soon as possible for a visit in 2 weeks.   Specialty: Orthopedic Surgery Contact information: [address] [place] [address] [phone]        [name], MD Follow up.   Specialty: Family Medicine Contact information: [place] [address] [phone]        Sanda Klein, MD .   Specialty:  Cardiology Contact information: [address] [place] [address] [phone]        Brand Males, MD Follow up.   Specialty: Pulmonary Disease Contact information: [address] [place] [address] [phone]            Contact information for after-discharge care    Destination    [facility] .   Service: Skilled Nursing Contact information: [place] [address] [phone]                 Allergies  Allergen Reactions  . Penicillins Diarrhea    Has patient had a PCN reaction causing immediate rash, facial/tongue/throat swelling, SOB or lightheadedness with hypotension: Unknown Has patient had a PCN reaction causing severe rash involving mucus membranes or skin necrosis: Unknown Has patient had a PCN reaction that required hospitalization: No  Has patient had a PCN reaction occurring within the last 10 years: No  If all of the above answers are "NO", then may proceed with Cephalosporin use.   . Influenza Vaccines Other (See Comments)    Severe nausea, vomiting and body aches-md told her not to take it anymore  . Clarithromycin     Unknown reaction   . Cortisone Other (See Comments)    "Made her feel like ice water was running through her veins"   . Povidone-Iodine     Unknown reaction     Consultations: Ortho and pulmonology Procedures/Studies: DG Sacrum/Coccyx  Result Date: 11/08/2019 CLINICAL DATA:  Mass on right buttocks. EXAM: SACRUM AND COCCYX - 2+ VIEW COMPARISON:  None. FINDINGS: Patient is status post right hip replacement. Visualized portions of the hardware in good position. No bony abnormalities. No fractures. IMPRESSION: No acute bony abnormalities.  Soft tissues are grossly unremarkable. Electronically Signed   By: Dorise Bullion III M.D   On: 11/08/2019 14:36   CT CHEST WO CONTRAST  Result Date: 11/04/2019 CLINICAL DATA:  New left upper lung opacity on  chest x-ray. EXAM: CT CHEST WITHOUT CONTRAST TECHNIQUE: Multidetector CT imaging of the chest was performed following the standard protocol without IV contrast. COMPARISON:  Chest x-ray 11/03/2019. Chest CT 05/14/2018. FINDINGS: Cardiovascular: The heart size is normal. No substantial pericardial effusion. Coronary artery calcification is evident. Atherosclerotic calcification is noted in the wall of the thoracic aorta. Mediastinum/Nodes: Mediastinal lymphadenopathy is progressive in the interval. 15 mm short axis precarinal node on 54/3 was 10 mm when I remeasure in a similar fashion on the prior study. 12 mm short axis subcarinal node on 62/3 was only about 8 mm short  axis when I remeasure on the prior exam. 13 mm short axis prevascular node on 48/3 has increased from 9 mm previously. The esophagus has normal imaging features. There is no axillary lymphadenopathy. Lungs/Pleura: Subpleural reticulation noted bilaterally consistent with underlying chronic interstitial/fibrotic lung disease. Interval development of a 7.1 x 5.9 cm left upper lobe masslike opacity extending into the left hilum. Areas of peripheral consolidative opacity are similar to prior with a new 2.2 cm air cyst in the posterior right upper lobe. No substantial pleural effusion. Upper Abdomen: Unremarkable. Musculoskeletal: No worrisome lytic or sclerotic osseous abnormality. IMPRESSION: 1. Interval development of a 7.1 x 5.9 cm left upper lobe masslike opacity extending into the left hilum. While this could possibly represent pneumonia, imaging features are concerning for neoplasm. 2. Interval development of mediastinal lymphadenopathy. Metastatic disease not excluded. 3. Underlying chronic interstitial/fibrotic lung disease. 4. Aortic Atherosclerosis (ICD10-I70.0). Electronically Signed   By: Misty Stanley M.D.   On: 11/04/2019 09:52   Pelvis Portable  Result Date: 11/04/2019 CLINICAL DATA:  79 year old female status post right hip  replacement. EXAM: PORTABLE PELVIS 1-2 VIEWS COMPARISON:  Intraoperative images earlier today. Portable pelvis radiograph yesterday. FINDINGS: Portable AP view at 1417 hours. New right total hip arthroplasty. Hardware appears intact with normal AP alignment. No unexpected osseous changes identified. Postoperative soft tissue gas in the region. No new osseous abnormality. Visible bowel-gas pattern within normal limits. IMPRESSION: Right total hip arthroplasty with no adverse features. Electronically Signed   By: Genevie Ann M.D.   On: 11/04/2019 14:46   DG Pelvis Portable  Result Date: 11/03/2019 CLINICAL DATA:  Status post fall. EXAM: PORTABLE PELVIS 1-2 VIEWS COMPARISON:  None. FINDINGS: Acute fracture deformity is seen extending through the neck of the proximal right femur. No significant displacement of the proximal and distal fracture sites is seen. There is no evidence of dislocation. No pelvic bone lesions are seen. IMPRESSION: Acute fracture of the proximal right femur. Electronically Signed   By: Virgina Norfolk M.D.   On: 11/03/2019 17:01   DG Chest Portable 1 View  Result Date: 11/03/2019 CLINICAL DATA:  Status post fall. EXAM: PORTABLE CHEST 1 VIEW COMPARISON:  May 13, 2018 FINDINGS: Mild-to-moderate severity diffuse, chronic appearing increased interstitial lung markings are seen. Mild biapical pleural thickening is noted. Mild atelectasis and/or infiltrate is seen within the left lung base and along the periphery of the right upper lobe. A 5.6 cm x 6.2 cm round patchy opacity is seen overlying the mid to upper left lung. This represents a new finding when compared to the prior exam. There is no evidence of a pleural effusion or pneumothorax. The heart size and mediastinal contours are within normal limits. The visualized skeletal structures are unremarkable. IMPRESSION: 1. Mild atelectasis and/or infiltrate within the left lung base and along the periphery of the right upper lobe. 2. New 5.6  cm x 6.2 cm round patchy opacity overlying the mid to upper left lung. While this may be infectious in etiology, the presence of a large left lung mass cannot be excluded. Correlation with chest CT is recommended. Electronically Signed   By: Virgina Norfolk M.D.   On: 11/03/2019 17:05   DG C-Arm 1-60 Min-No Report  Result Date: 11/04/2019 Fluoroscopy was utilized by the requesting physician.  No radiographic interpretation.   ECHOCARDIOGRAM COMPLETE  Result Date: 11/05/2019    ECHOCARDIOGRAM REPORT   Patient Name:   Chauntel A Fukushima Date of Exam: 11/05/2019 Medical Rec #:  [id number]  Height:       66.0 in Accession #:    [id number]     Weight:       155.9 lb Date of Birth:  [birth year]       BSA:          1.799 m Patient Age:    33 years       BP:           107/69 mmHg Patient Gender: F              HR:           98 bpm. Exam Location:  Inpatient Procedure: 2D Echo, Color Doppler and Cardiac Doppler Indications:    R06.9 DOE  History:        Patient has no prior history of Echocardiogram examinations.                 Risk Factors:Hypertension, Diabetes and Dyslipidemia.  Sonographer:    Raquel Sarna Senior RDCS Referring Phys: [id number] ALEXIS HUGELMEYER  Sonographer Comments: Scanned supine, 1 day post hip-surgery IMPRESSIONS  1. Left ventricular ejection fraction, by estimation, is 65 to 70%. The left ventricle has normal function. The left ventricle has no regional wall motion abnormalities. Left ventricular diastolic parameters are consistent with Grade I diastolic dysfunction (impaired relaxation).  2. Right ventricular systolic function is normal. The right ventricular size is normal. There is moderately elevated pulmonary artery systolic pressure. The estimated right ventricular systolic pressure is 47.8 mmHg.  3. The mitral valve is grossly normal. Trivial mitral valve regurgitation.  4. The aortic valve is tricuspid. Aortic valve regurgitation is not visualized.  5. The inferior vena cava is normal in size  with greater than 50% respiratory variability, suggesting right atrial pressure of 3 mmHg. FINDINGS  Left Ventricle: Left ventricular ejection fraction, by estimation, is 65 to 70%. The left ventricle has normal function. The left ventricle has no regional wall motion abnormalities. The left ventricular internal cavity size was normal in size. There is  no left ventricular hypertrophy. Left ventricular diastolic parameters are consistent with Grade I diastolic dysfunction (impaired relaxation). Indeterminate filling pressures. Right Ventricle: The right ventricular size is normal. No increase in right ventricular wall thickness. Right ventricular systolic function is normal. There is moderately elevated pulmonary artery systolic pressure. The tricuspid regurgitant velocity is 3.37 m/s, and with an assumed right atrial pressure of 3 mmHg, the estimated right ventricular systolic pressure is 29.5 mmHg. Left Atrium: Left atrial size was normal in size. Right Atrium: Right atrial size was normal in size. Pericardium: There is no evidence of pericardial effusion. Mitral Valve: The mitral valve is grossly normal. Trivial mitral valve regurgitation. Tricuspid Valve: The tricuspid valve is grossly normal. Tricuspid valve regurgitation is trivial. Aortic Valve: The aortic valve is tricuspid. Aortic valve regurgitation is not visualized. Pulmonic Valve: The pulmonic valve was normal in structure. Pulmonic valve regurgitation is not visualized. Aorta: The aortic root and ascending aorta are structurally normal, with no evidence of dilitation. Venous: The inferior vena cava is normal in size with greater than 50% respiratory variability, suggesting right atrial pressure of 3 mmHg. IAS/Shunts: No atrial level shunt detected by color flow Doppler.  LEFT VENTRICLE PLAX 2D LVIDd:         3.70 cm  Diastology LVIDs:         2.20 cm  LV e' lateral: 10.20 cm/s LV PW:         0.90 cm LV IVS:  0.90 cm LVOT diam:     1.80 cm LV SV:          35 LV SV Index:   20 LVOT Area:     2.54 cm  RIGHT VENTRICLE RV S prime:     18.20 cm/s TAPSE (M-mode): 2.2 cm LEFT ATRIUM             Index       RIGHT ATRIUM           Index LA diam:        2.00 cm 1.11 cm/m  RA Area:     14.20 cm LA Vol (A2C):   39.8 ml 22.13 ml/m RA Volume:   39.10 ml  21.74 ml/m LA Vol (A4C):   46.1 ml 25.63 ml/m LA Biplane Vol: 47.7 ml 26.52 ml/m  AORTIC VALVE LVOT Vmax:   81.60 cm/s LVOT Vmean:  60.500 cm/s LVOT VTI:    0.138 m  AORTA Ao Root diam: 3.40 cm Ao Asc diam:  3.20 cm TRICUSPID VALVE TR Peak grad:   45.4 mmHg TR Vmax:        337.00 cm/s  SHUNTS Systemic VTI:  0.14 m Systemic Diam: 1.80 cm Lyman Bishop MD Electronically signed by Lyman Bishop MD Signature Date/Time: 11/05/2019/3:48:12 PM    Final    DG HIP OPERATIVE UNILAT W OR W/O PELVIS RIGHT  Result Date: 11/04/2019 CLINICAL DATA:  Right hip replacement EXAM: OPERATIVE RIGHT HIP WITH PELVIS COMPARISON:  None. FLUOROSCOPY TIME:  Radiation Exposure Index (as provided by the fluoroscopic device): Not available If the device does not provide the exposure index: Fluoroscopy Time:  10 seconds Number of Acquired Images:  5 FINDINGS: Multiple images were obtained during right hip replacement. No acute bony or soft tissue abnormality is noted. IMPRESSION: Status post right hip replacement Electronically Signed   By: Inez Catalina M.D.   On: 11/04/2019 15:08    (Echo, Carotid, EGD, Colonoscopy, ERCP)    Subjective: Patient is resting in bed awake alert anxious to go to rehab family by the bedside no new complaints she reports that she feels better after the bronc  Discharge Exam: Vitals:   11/13/19 2124 11/14/19 0556  BP: (!) 119/58 (!) 120/59  Pulse: 96 88  Resp: 18 17  Temp: 97.8 F (36.6 C) (!) 97.5 F (36.4 C)  SpO2: 90% 98%   Vitals:   11/13/19 1351 11/13/19 1405 11/13/19 2124 11/14/19 0556  BP: (!) 107/58  (!) 119/58 (!) 120/59  Pulse: 98  96 88  Resp: 17  18 17   Temp:   97.8 F (36.6 C) (!)  97.5 F (36.4 C)  TempSrc:   Oral Oral  SpO2: 96% (!) 87% 90% 98%  Weight:      Height:        General: Pt is alert, awake, not in acute distress Cardiovascular: RRR, S1/S2 +, no rubs, no gallops Respiratory: CTA bilaterally, no wheezing, no rhonchi Abdominal: Soft, NT, ND, bowel sounds + Extremities: no edema, no cyanosis    The results of significant diagnostics from this hospitalization (including imaging, microbiology, ancillary and laboratory) are listed below for reference.     Microbiology: Recent Results (from the past 240 hour(s))  SARS CORONAVIRUS 2 (TAT 6-24 HRS) Nasopharyngeal Nasopharyngeal Swab     Status: None   Collection Time: 11/13/19  2:27 PM   Specimen: Nasopharyngeal Swab  Result Value Ref Range Status   SARS Coronavirus 2 NEGATIVE NEGATIVE Final    Comment: (NOTE)  SARS-CoV-2 target nucleic acids are NOT DETECTED. The SARS-CoV-2 RNA is generally detectable in upper and lower respiratory specimens during the acute phase of infection. Negative results do not preclude SARS-CoV-2 infection, do not rule out co-infections with other pathogens, and should not be used as the sole basis for treatment or other patient management decisions. Negative results must be combined with clinical observations, patient history, and epidemiological information. The expected result is Negative. Fact Sheet for Patients: SugarRoll.be Fact Sheet for Healthcare Providers: https://www.woods-Jayren Cease.com/ This test is not yet approved or cleared by the [place] FDA and  has been authorized for detection and/or diagnosis of SARS-CoV-2 by FDA under an Emergency Use Authorization (EUA). This EUA will remain  in effect (meaning this test can be used) for the duration of the COVID-19 declaration under Section 56 4(b)(1) of the Act, 21 U.S.C. section 360bbb-3(b)(1), unless the authorization is terminated or revoked sooner. Performed at  [facility], [place] [address], [place], [place] [address]      Labs: BNP (last 3 results) No results for input(s): BNP in the last 8760 hours. Basic Metabolic Panel: Recent Labs  Lab 11/08/19 0439 11/10/19 0353 11/11/19 0456  NA 137 138 136  K 3.8 4.2 4.2  CL 103 102 101  CO2 29 27 26   GLUCOSE 134* 142* 139*  BUN 11 8 9   CREATININE 0.39* 0.57 0.73  CALCIUM 8.6* 8.8* 9.0   Liver Function Tests: No results for input(s): AST, ALT, ALKPHOS, BILITOT, PROT, ALBUMIN in the last 168 hours. No results for input(s): LIPASE, AMYLASE in the last 168 hours. No results for input(s): AMMONIA in the last 168 hours. CBC: Recent Labs  Lab 11/08/19 0439 11/09/19 0809 11/10/19 0353 11/11/19 0456  WBC 7.9 8.9 10.0 8.5  HGB 8.6* 8.7* 8.7* 9.7*  HCT 28.8* 27.8* 28.1* 31.6*  MCV 87.8 87.1 86.5 88.0  PLT 281 241 277 289   Cardiac Enzymes: No results for input(s): CKTOTAL, CKMB, CKMBINDEX, TROPONINI in the last 168 hours. BNP: Invalid input(s): POCBNP CBG: Recent Labs  Lab 11/13/19 0755 11/13/19 1205 11/13/19 1619 11/13/19 2120 11/14/19 0731  GLUCAP 135* 303* 286* 241* 200*   D-Dimer No results for input(s): DDIMER in the last 72 hours. Hgb A1c No results for input(s): HGBA1C in the last 72 hours. Lipid Profile No results for input(s): CHOL, HDL, LDLCALC, TRIG, CHOLHDL, LDLDIRECT in the last 72 hours. Thyroid function studies No results for input(s): TSH, T4TOTAL, T3FREE, THYROIDAB in the last 72 hours.  Invalid input(s): FREET3 Anemia work up No results for input(s): VITAMINB12, FOLATE, FERRITIN, TIBC, IRON, RETICCTPCT in the last 72 hours. Urinalysis    Component Value Date/Time   COLORURINE YELLOW 05/13/2018 2053   APPEARANCEUR CLEAR 05/13/2018 2053   LABSPEC 1.016 05/13/2018 2053   PHURINE 6.0 05/13/2018 2053   GLUCOSEU NEGATIVE 05/13/2018 2053   HGBUR SMALL (A) 05/13/2018 2053   BILIRUBINUR NEGATIVE 05/13/2018 2053   KETONESUR NEGATIVE 05/13/2018 2053    PROTEINUR 30 (A) 05/13/2018 2053   NITRITE NEGATIVE 05/13/2018 2053   LEUKOCYTESUR TRACE (A) 05/13/2018 2053   Sepsis Labs Invalid input(s): PROCALCITONIN,  WBC,  LACTICIDVEN Microbiology Recent Results (from the past 240 hour(s))  SARS CORONAVIRUS 2 (TAT 6-24 HRS) Nasopharyngeal Nasopharyngeal Swab     Status: None   Collection Time: 11/13/19  2:27 PM   Specimen: Nasopharyngeal Swab  Result Value Ref Range Status   SARS Coronavirus 2 NEGATIVE NEGATIVE Final    Comment: (NOTE) SARS-CoV-2 target nucleic acids are NOT  DETECTED. The SARS-CoV-2 RNA is generally detectable in upper and lower respiratory specimens during the acute phase of infection. Negative results do not preclude SARS-CoV-2 infection, do not rule out co-infections with other pathogens, and should not be used as the sole basis for treatment or other patient management decisions. Negative results must be combined with clinical observations, patient history, and epidemiological information. The expected result is Negative. Fact Sheet for Patients: SugarRoll.be Fact Sheet for Healthcare Providers: https://www.woods-Jyla Hopf.com/ This test is not yet approved or cleared by the [place] FDA and  has been authorized for detection and/or diagnosis of SARS-CoV-2 by FDA under an Emergency Use Authorization (EUA). This EUA will remain  in effect (meaning this test can be used) for the duration of the COVID-19 declaration under Section 56 4(b)(1) of the Act, 21 U.S.C. section 360bbb-3(b)(1), unless the authorization is terminated or revoked sooner. Performed at [facility], [place] [address], [place], [place] [address]      Time coordinating discharge: 39 minutes  SIGNED:   Georgette Shell, MD  [facility] 11/14/2019, 10:34 AM Pager   If 7PM-7AM, please contact night-coverage [email or web address] Password [email or web address]

## 2019-11-14 NOTE — Progress Notes (Signed)
D/C Summary sent to [facility] via the hub.  Winona Legato at [facility] updated.  CSW will continue to follow for D/C needs.  Alphonse Guild. Moiz Ryant  MSW, LCSW, LCAS, CCS Transitions of Care Clinical Social Worker Care Coordination Department Ph: [phone]

## 2019-11-16 ENCOUNTER — Encounter: Payer: Self-pay | Admitting: *Deleted

## 2019-11-16 DIAGNOSIS — R918 Other nonspecific abnormal finding of lung field: Secondary | ICD-10-CM

## 2019-11-16 LAB — SURGICAL PATHOLOGY

## 2019-11-16 LAB — CYTOLOGY - NON PAP

## 2019-11-16 NOTE — Progress Notes (Signed)
I received referral on [name] today.  I notified new patient coordinator to call and schedule with Cassie tomorrow.

## 2019-11-17 ENCOUNTER — Telehealth: Payer: Self-pay | Admitting: Emergency Medicine

## 2019-11-17 NOTE — Telephone Encounter (Signed)
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

## 2019-11-20 ENCOUNTER — Telehealth: Payer: Self-pay | Admitting: Internal Medicine

## 2019-11-20 ENCOUNTER — Telehealth: Payer: Self-pay | Admitting: *Deleted

## 2019-11-20 NOTE — Telephone Encounter (Signed)
Ms. Bucy was referred to Dr. Julien Nordmann. I called her and she would like to do a virtual visit with Dr. Julien Nordmann.  I updated him and will back with patient.

## 2019-11-20 NOTE — Telephone Encounter (Signed)
Last CT Chest and also last PET scan from 2019 and last ov note faxed to Vickie's attn at the number provided.  Spoke with the pt's son Shanon Brow and notified that this was done.  Nothing further needed.

## 2019-11-23 ENCOUNTER — Telehealth: Payer: Self-pay | Admitting: Internal Medicine

## 2019-11-23 NOTE — Telephone Encounter (Signed)
[name] has been scheduled for a telephone visit to w/Dr. Julien Nordmann on 6/22 at 2:15pm. [name] been made aware of the appt date and time.

## 2019-11-24 ENCOUNTER — Other Ambulatory Visit (HOSPITAL_COMMUNITY): Payer: Medicare Other

## 2019-11-25 ENCOUNTER — Telehealth: Payer: Self-pay | Admitting: Internal Medicine

## 2019-11-25 NOTE — Telephone Encounter (Signed)
Called and spoke with Vickie at [facility] stating to her that it seems like pt's son has wanted pt to be seen at [facility]. Stated to her that pt had bronch which showed carcinoma of lung. Vickie requested to have the bronch report as well as last OV to be sent to her. This has been faxed to provided fax number by Vickie. Nothing further needed.

## 2019-11-26 ENCOUNTER — Other Ambulatory Visit: Payer: Self-pay | Admitting: *Deleted

## 2019-11-26 NOTE — Progress Notes (Signed)
The proposed treatment discussed in cancer conference 11/26/19 is for discussion purpose only and is not a binding recommendation.  The patient was not physically examined nor present for her treatment options.  Therefore, final treatment plans cannot be decided.

## 2019-12-01 ENCOUNTER — Inpatient Hospital Stay: Payer: Medicare Other | Attending: Internal Medicine | Admitting: Internal Medicine

## 2019-12-01 ENCOUNTER — Encounter: Payer: Self-pay | Admitting: Internal Medicine

## 2019-12-01 DIAGNOSIS — Z87891 Personal history of nicotine dependence: Secondary | ICD-10-CM | POA: Diagnosis not present

## 2019-12-01 DIAGNOSIS — I10 Essential (primary) hypertension: Secondary | ICD-10-CM

## 2019-12-01 DIAGNOSIS — Z5111 Encounter for antineoplastic chemotherapy: Secondary | ICD-10-CM | POA: Insufficient documentation

## 2019-12-01 DIAGNOSIS — C3492 Malignant neoplasm of unspecified part of left bronchus or lung: Secondary | ICD-10-CM | POA: Insufficient documentation

## 2019-12-01 DIAGNOSIS — C3412 Malignant neoplasm of upper lobe, left bronchus or lung: Secondary | ICD-10-CM

## 2019-12-01 DIAGNOSIS — C3490 Malignant neoplasm of unspecified part of unspecified bronchus or lung: Secondary | ICD-10-CM

## 2019-12-01 NOTE — Progress Notes (Signed)
[place] [phone]   [phone]  CONSULT NOTE I connected with@ on 12/01/19 at  2:15 PM EDT by video enabled telemedicine visit and verified that I am speaking with the correct person using two identifiers.   I discussed the limitations, risks, security and privacy concerns of performing an evaluation and management service by telemedicine and the availability of in-person appointments. I also discussed with the patient that there may be a patient responsible charge related to this service. The patient expressed understanding and agreed to proceed.  Other persons participating in the visit and their role in the encounter: Her son [name].  Patient's location: Home Provider's location: [place].  REFERRING PHYSICIAN: Dr. Baltazar [name]  REASON FOR CONSULTATION:  79 years old white female recently diagnosed with lung cancer.  HPI [name] is a 79 y.o. female with past medical history significant for bronchiectasis, interstitial lung disease, history of chronic diarrhea currently resolved, thyroid disease, irritable bowel syndrome as well as diabetes mellitus and hyperplastic colon polyps.  The patient mentions that in November 2019 she was diagnosed with viral infection that resulted in interstitial lung disease.  CT of the chest performed at that time that showed the interstitial lung disease but there was enlarging masslike area in the periphery of the left upper lobe currently measuring 2.5 x 2.0 x 3.3 cm with internal air bronchograms suspicious for slowly growing neoplasm.  The patient had a PET scan on June 05, 2018 that showed hypermetabolism corresponding to the pleural-based left upper lobe soft tissue density lesion suspicious for primary bronchogenic carcinoma.  There was also low-grade hypermetabolism within the prevascular and bilateral mediastinal nodes but no evidence for extrathoracic metastatic disease.  The patient  was seen by Dr. Roxan [name] from thoracic surgery for consideration of surgical resection but unfortunately she has been dealing with her husband who is suffering from a terminal cancer and died recently.  She was unable to keep her follow-up visit and take care of her issues.  The patient was admitted to the hospital on Nov 03, 2019 with a right hip fracture.  She underwent internal fixation under the care of Dr. Alvan [name].  During her admission she had repeat CT scan of the chest without contrast on 11/04/2019.  It showed interval development of 7.1 x 5.9 cm left upper lobe masslike opacity extending into the left hilum.  There was areas of peripheral consolidative opacities similar to the prior with a new 2.2 cm air cyst in the posterior right upper lobe.  There was also interval development of mediastinal lymphadenopathy.  Metastatic disease could not be excluded.  The patient was seen by Dr. Lamonte [name].  The patient underwent bronchoscopy on 11/13/2019.  The final pathology ([id number]) showed malignant cells consistent with squamous cell carcinoma.  The patient was sent to the rehabilitation center for physical therapy and she was discharged home recently.  I had a MyChart video visit with the patient and her son today for evaluation of her condition and discussion of her treatment options. When seen today she continues to complain of cough and shortness of breath at baseline increased with exertion but no significant chest pain or hemoptysis.  She lost around a total of 40 pounds in the last 2 years.  She has no headache but has some visual changes.  She has no nausea, vomiting, diarrhea or constipation. Family history significant for mother with heart disease, father died from pneumonia sister has heart disease and  diabetes. Social history the patient is a widow and has 3 children.  She was accompanied today on the video visit by her son [name] who has the healthcare power of attorney.  She used to work as an  Optometrist.  She has a history of smoking more than 3 packs a day for over 20 years but quit 4 years ago.  She has no history of alcohol or drug abuse.  HPI  Past Medical History:  Diagnosis Date  . Bronchiectasis    PFTs August 23, 2010 VC 82%   dlco 62 > 129 CORRECTED  . Chronic diarrhea   . Chronic rhinitis    sinus CT ordered August 23, 2010  . Diabetes mellitus ([place])   . Hyperplastic colon polyp   . IBS (irritable bowel syndrome)   . ILD (interstitial lung disease) ([place])   . Stenosis of rectum and anus   . Thyroid disease     Past Surgical History:  Procedure Laterality Date  .  gallbaldder    . BRONCHIAL BIOPSY  11/13/2019   Procedure: BRONCHIAL BIOPSIES;  Surgeon: Sylvia Gobble, MD;  Location: [facility];  Service: Cardiopulmonary;;  . BRONCHIAL BRUSHINGS  11/13/2019   Procedure: BRONCHIAL BRUSHINGS;  Surgeon: Sylvia Gobble, MD;  Location: [facility];  Service: Cardiopulmonary;;  . CHOLECYSTECTOMY    . DILATION AND CURETTAGE OF UTERUS    . HAND SURGERY  1991   left hand; Baker's Cyst removed  . migraine    . tear duct surgery    . TOTAL HIP ARTHROPLASTY Right 11/04/2019   Procedure: TOTAL HIP ARTHROPLASTY ANTERIOR APPROACH;  Surgeon: Paralee Cancel, MD;  Location: [facility];  Service: Orthopedics;  Laterality: Right;  [name] VIDEO BRONCHOSCOPY N/A 11/13/2019   Procedure: VIDEO BRONCHOSCOPY;  Surgeon: Sylvia Gobble, MD;  Location: [facility];  Service: Cardiopulmonary;  Laterality: N/A;    Family History  Problem Relation Age of Onset  . Colon polyps Sister   . Heart disease Sister   . Diabetes Sister   . Heart disease Mother   . Colon cancer Neg Hx     Social History Social History   Tobacco Use  . Smoking status: Former Smoker    Packs/day: 3.00    Years: 15.00    Pack years: 45.00    Types: Cigarettes    Quit date: 06/11/1976    Years since quitting: 43.5  . Smokeless tobacco: Never Used  Substance Use Topics  . Alcohol use: No  . Drug use: No     Allergies  Allergen Reactions  . Penicillins Diarrhea    Has patient had a PCN reaction causing immediate rash, facial/tongue/throat swelling, SOB or lightheadedness with hypotension: Unknown Has patient had a PCN reaction causing severe rash involving mucus membranes or skin necrosis: Unknown Has patient had a PCN reaction that required hospitalization: No  Has patient had a PCN reaction occurring within the last 10 years: No  If all of the above answers are "NO", then may proceed with Cephalosporin use.   . Influenza Vaccines Other (See Comments)    Severe nausea, vomiting and body aches-md told her not to take it anymore  . Clarithromycin     Unknown reaction   . Cortisone Other (See Comments)    "Made her feel like ice water was running through her veins"   . Povidone-Iodine     Unknown reaction     Current Outpatient Medications  Medication Sig Dispense Refill  . Ascorbic Acid (VITAMIN C)  1000 MG tablet Take 1,000 mg by mouth at bedtime.     . benzonatate (TESSALON) 100 MG capsule TAKE 1 CAPSULE (100 MG TOTAL) BY MOUTH EVERY 8 (EIGHT) HOURS AS NEEDED FOR COUGH. 30 capsule 1  . Biotin 10000 MCG TABS Take 10,000 mcg by mouth daily.     . calcium carbonate (TUMS - DOSED IN MG ELEMENTAL CALCIUM) 500 MG chewable tablet Chew 1 tablet (200 mg of elemental calcium total) by mouth 3 (three) times daily with meals.    . Calcium-Magnesium-Vitamin D (CALCIUM 1200+D3 PO) Take 1 tablet by mouth at bedtime.    . celecoxib (CELEBREX) 200 MG capsule Take 1 capsule (200 mg total) by mouth 2 (two) times daily. 60 capsule 0  . clorazepate (TRANXENE) 3.75 MG tablet TAKE 1 TABLET BY MOUTH DAILY.MAY TAKE UP TO 2 DAILY IF NEEDED 60 tablet 1  . cyclobenzaprine (FLEXERIL) 10 MG tablet Take 10 mg by mouth at bedtime.     . docusate sodium (COLACE) 100 MG capsule Take 1 capsule (100 mg total) by mouth 2 (two) times daily. 10 capsule 0  . ferrous sulfate 325 (65 FE) MG tablet Take 1 tablet (325 mg total)  by mouth 3 (three) times daily after meals for 14 days. 42 tablet 0  . HYDROcodone-acetaminophen (NORCO/VICODIN) 5-325 MG tablet Take 1-2 tablets by mouth every 6 (six) hours as needed for moderate pain or severe pain (pain score 4-6). 42 tablet 0  . levothyroxine (SYNTHROID, LEVOTHROID) 88 MCG tablet Take 88 mcg by mouth daily before breakfast.     . menthol-cetylpyridinium (CEPACOL) 3 MG lozenge Take 1 lozenge (3 mg total) by mouth as needed for sore throat (sore throat). 100 tablet 12  . Multiple Vitamin (MULTIVITAMIN WITH MINERALS) TABS tablet Take 1 tablet by mouth daily.    . Multiple Vitamin (MULTIVITAMIN) tablet Take 1 tablet by mouth daily.      . phenol (CHLORASEPTIC) 1.4 % LIQD Use as directed 1 spray in the mouth or throat as needed for throat irritation / pain.  0  . pioglitazone (ACTOS) 15 MG tablet Take 15 mg by mouth daily.    . polyvinyl alcohol (LIQUIFILM TEARS) 1.4 % ophthalmic solution Place 1 drop into the left eye 3 (three) times daily as needed for dry eyes. 15 mL 0  . pravastatin (PRAVACHOL) 40 MG tablet TAKE 1 TABLET BY MOUTH EVERY DAY IN THE EVENING 90 tablet 0  . rivaroxaban (XARELTO) 10 MG TABS tablet Take 1 tablet (10 mg total) by mouth daily for 21 days. 21 tablet 0  . verapamil (CALAN) 120 MG tablet Take 0.5 tablets (60 mg total) by mouth 2 (two) times daily. 60 tablet 2   No current facility-administered medications for this visit.    Review of Systems  Constitutional: positive for fatigue and weight loss Eyes: negative Ears, nose, mouth, throat, and face: negative Respiratory: positive for dyspnea on exertion Cardiovascular: negative Gastrointestinal: negative Genitourinary:negative Integument/breast: negative Hematologic/lymphatic: negative Musculoskeletal:negative Neurological: negative Behavioral/Psych: negative Endocrine: negative Allergic/Immunologic: negative  Physical Exam Not performed because this is a video visit.   PERFORMANCE STATUS:  ECOG 1  LABORATORY DATA: Lab Results  Component Value Date   WBC 8.5 11/11/2019   HGB 9.7 (L) 11/11/2019   HCT 31.6 (L) 11/11/2019   MCV 88.0 11/11/2019   PLT 289 11/11/2019      Chemistry      Component Value Date/Time   NA 136 11/11/2019 0456   K 4.2 11/11/2019 0456   CL  101 11/11/2019 0456   CO2 26 11/11/2019 0456   BUN 9 11/11/2019 0456   CREATININE 0.73 11/11/2019 0456      Component Value Date/Time   CALCIUM 9.0 11/11/2019 0456   ALKPHOS 79 11/03/2019 1613   AST 22 11/03/2019 1613   ALT 14 11/03/2019 1613   BILITOT 0.4 11/03/2019 1613       RADIOGRAPHIC STUDIES: DG Sacrum/Coccyx  Result Date: 11/08/2019 CLINICAL DATA:  Mass on right buttocks. EXAM: SACRUM AND COCCYX - 2+ VIEW COMPARISON:  None. FINDINGS: Patient is status post right hip replacement. Visualized portions of the hardware in good position. No bony abnormalities. No fractures. IMPRESSION: No acute bony abnormalities.  Soft tissues are grossly unremarkable. Electronically Signed   By: Dorise Bullion III M.D   On: 11/08/2019 14:36   CT CHEST WO CONTRAST  Result Date: 11/04/2019 CLINICAL DATA:  New left upper lung opacity on chest x-ray. EXAM: CT CHEST WITHOUT CONTRAST TECHNIQUE: Multidetector CT imaging of the chest was performed following the standard protocol without IV contrast. COMPARISON:  Chest x-ray 11/03/2019. Chest CT 05/14/2018. FINDINGS: Cardiovascular: The heart size is normal. No substantial pericardial effusion. Coronary artery calcification is evident. Atherosclerotic calcification is noted in the wall of the thoracic aorta. Mediastinum/Nodes: Mediastinal lymphadenopathy is progressive in the interval. 15 mm short axis precarinal node on 54/3 was 10 mm when I remeasure in a similar fashion on the prior study. 12 mm short axis subcarinal node on 62/3 was only about 8 mm short axis when I remeasure on the prior exam. 13 mm short axis prevascular node on 48/3 has increased from 9 mm previously. The  esophagus has normal imaging features. There is no axillary lymphadenopathy. Lungs/Pleura: Subpleural reticulation noted bilaterally consistent with underlying chronic interstitial/fibrotic lung disease. Interval development of a 7.1 x 5.9 cm left upper lobe masslike opacity extending into the left hilum. Areas of peripheral consolidative opacity are similar to prior with a new 2.2 cm air cyst in the posterior right upper lobe. No substantial pleural effusion. Upper Abdomen: Unremarkable. Musculoskeletal: No worrisome lytic or sclerotic osseous abnormality. IMPRESSION: 1. Interval development of a 7.1 x 5.9 cm left upper lobe masslike opacity extending into the left hilum. While this could possibly represent pneumonia, imaging features are concerning for neoplasm. 2. Interval development of mediastinal lymphadenopathy. Metastatic disease not excluded. 3. Underlying chronic interstitial/fibrotic lung disease. 4. Aortic Atherosclerosis (ICD10-I70.0). Electronically Signed   By: Misty Stanley M.D.   On: 11/04/2019 09:52   Pelvis Portable  Result Date: 11/04/2019 CLINICAL DATA:  79 year old female status post right hip replacement. EXAM: PORTABLE PELVIS 1-2 VIEWS COMPARISON:  Intraoperative images earlier today. Portable pelvis radiograph yesterday. FINDINGS: Portable AP view at 1417 hours. New right total hip arthroplasty. Hardware appears intact with normal AP alignment. No unexpected osseous changes identified. Postoperative soft tissue gas in the region. No new osseous abnormality. Visible bowel-gas pattern within normal limits. IMPRESSION: Right total hip arthroplasty with no adverse features. Electronically Signed   By: Genevie Ann M.D.   On: 11/04/2019 14:46   DG Pelvis Portable  Result Date: 11/03/2019 CLINICAL DATA:  Status post fall. EXAM: PORTABLE PELVIS 1-2 VIEWS COMPARISON:  None. FINDINGS: Acute fracture deformity is seen extending through the neck of the proximal right femur. No significant  displacement of the proximal and distal fracture sites is seen. There is no evidence of dislocation. No pelvic bone lesions are seen. IMPRESSION: Acute fracture of the proximal right femur. Electronically Signed   By: Hoover Browns  Houston M.D.   On: 11/03/2019 17:01   DG Chest Portable 1 View  Result Date: 11/03/2019 CLINICAL DATA:  Status post fall. EXAM: PORTABLE CHEST 1 VIEW COMPARISON:  May 13, 2018 FINDINGS: Mild-to-moderate severity diffuse, chronic appearing increased interstitial lung markings are seen. Mild biapical pleural thickening is noted. Mild atelectasis and/or infiltrate is seen within the left lung base and along the periphery of the right upper lobe. A 5.6 cm x 6.2 cm round patchy opacity is seen overlying the mid to upper left lung. This represents a new finding when compared to the prior exam. There is no evidence of a pleural effusion or pneumothorax. The heart size and mediastinal contours are within normal limits. The visualized skeletal structures are unremarkable. IMPRESSION: 1. Mild atelectasis and/or infiltrate within the left lung base and along the periphery of the right upper lobe. 2. New 5.6 cm x 6.2 cm round patchy opacity overlying the mid to upper left lung. While this may be infectious in etiology, the presence of a large left lung mass cannot be excluded. Correlation with chest CT is recommended. Electronically Signed   By: Virgina Norfolk M.D.   On: 11/03/2019 17:05   DG C-Arm 1-60 Min-No Report  Result Date: 11/04/2019 Fluoroscopy was utilized by the requesting physician.  No radiographic interpretation.   ECHOCARDIOGRAM COMPLETE  Result Date: 11/05/2019    ECHOCARDIOGRAM REPORT   Patient Name:   Michaelah A Rodman Date of Exam: 11/05/2019 Medical Rec #:  [id number]      Height:       66.0 in Accession #:    [id number]     Weight:       155.9 lb Date of Birth:  [birth year]       BSA:          1.799 m Patient Age:    42 years       BP:           107/69 mmHg Patient  Gender: F              HR:           98 bpm. Exam Location:  Inpatient Procedure: 2D Echo, Color Doppler and Cardiac Doppler Indications:    R06.9 DOE  History:        Patient has no prior history of Echocardiogram examinations.                 Risk Factors:Hypertension, Diabetes and Dyslipidemia.  Sonographer:    Raquel Sarna Senior RDCS Referring Phys: [id number] ALEXIS HUGELMEYER  Sonographer Comments: Scanned supine, 1 day post hip-surgery IMPRESSIONS  1. Left ventricular ejection fraction, by estimation, is 65 to 70%. The left ventricle has normal function. The left ventricle has no regional wall motion abnormalities. Left ventricular diastolic parameters are consistent with Grade I diastolic dysfunction (impaired relaxation).  2. Right ventricular systolic function is normal. The right ventricular size is normal. There is moderately elevated pulmonary artery systolic pressure. The estimated right ventricular systolic pressure is 93.2 mmHg.  3. The mitral valve is grossly normal. Trivial mitral valve regurgitation.  4. The aortic valve is tricuspid. Aortic valve regurgitation is not visualized.  5. The inferior vena cava is normal in size with greater than 50% respiratory variability, suggesting right atrial pressure of 3 mmHg. FINDINGS  Left Ventricle: Left ventricular ejection fraction, by estimation, is 65 to 70%. The left ventricle has normal function. The left ventricle has no regional wall motion abnormalities. The left ventricular internal cavity size  was normal in size. There is  no left ventricular hypertrophy. Left ventricular diastolic parameters are consistent with Grade I diastolic dysfunction (impaired relaxation). Indeterminate filling pressures. Right Ventricle: The right ventricular size is normal. No increase in right ventricular wall thickness. Right ventricular systolic function is normal. There is moderately elevated pulmonary artery systolic pressure. The tricuspid regurgitant velocity is 3.37 m/s,  and with an assumed right atrial pressure of 3 mmHg, the estimated right ventricular systolic pressure is 10.6 mmHg. Left Atrium: Left atrial size was normal in size. Right Atrium: Right atrial size was normal in size. Pericardium: There is no evidence of pericardial effusion. Mitral Valve: The mitral valve is grossly normal. Trivial mitral valve regurgitation. Tricuspid Valve: The tricuspid valve is grossly normal. Tricuspid valve regurgitation is trivial. Aortic Valve: The aortic valve is tricuspid. Aortic valve regurgitation is not visualized. Pulmonic Valve: The pulmonic valve was normal in structure. Pulmonic valve regurgitation is not visualized. Aorta: The aortic root and ascending aorta are structurally normal, with no evidence of dilitation. Venous: The inferior vena cava is normal in size with greater than 50% respiratory variability, suggesting right atrial pressure of 3 mmHg. IAS/Shunts: No atrial level shunt detected by color flow Doppler.  LEFT VENTRICLE PLAX 2D LVIDd:         3.70 cm  Diastology LVIDs:         2.20 cm  LV e' lateral: 10.20 cm/s LV PW:         0.90 cm LV IVS:        0.90 cm LVOT diam:     1.80 cm LV SV:         35 LV SV Index:   20 LVOT Area:     2.54 cm  RIGHT VENTRICLE RV S prime:     18.20 cm/s TAPSE (M-mode): 2.2 cm LEFT ATRIUM             Index       RIGHT ATRIUM           Index LA diam:        2.00 cm 1.11 cm/m  RA Area:     14.20 cm LA Vol (A2C):   39.8 ml 22.13 ml/m RA Volume:   39.10 ml  21.74 ml/m LA Vol (A4C):   46.1 ml 25.63 ml/m LA Biplane Vol: 47.7 ml 26.52 ml/m  AORTIC VALVE LVOT Vmax:   81.60 cm/s LVOT Vmean:  60.500 cm/s LVOT VTI:    0.138 m  AORTA Ao Root diam: 3.40 cm Ao Asc diam:  3.20 cm TRICUSPID VALVE TR Peak grad:   45.4 mmHg TR Vmax:        337.00 cm/s  SHUNTS Systemic VTI:  0.14 m Systemic Diam: 1.80 cm Lyman Bishop MD Electronically signed by Lyman Bishop MD Signature Date/Time: 11/05/2019/3:48:12 PM    Final    DG HIP OPERATIVE UNILAT W OR W/O  PELVIS RIGHT  Result Date: 11/04/2019 CLINICAL DATA:  Right hip replacement EXAM: OPERATIVE RIGHT HIP WITH PELVIS COMPARISON:  None. FLUOROSCOPY TIME:  Radiation Exposure Index (as provided by the fluoroscopic device): Not available If the device does not provide the exposure index: Fluoroscopy Time:  10 seconds Number of Acquired Images:  5 FINDINGS: Multiple images were obtained during right hip replacement. No acute bony or soft tissue abnormality is noted. IMPRESSION: Status post right hip replacement Electronically Signed   By: Inez Catalina M.D.   On: 11/04/2019 15:08    ASSESSMENT: This is a very  pleasant 79 years old white female with at least stage IIIb (T4, N3, M0) non-small cell lung cancer, squamous cell carcinoma presented with large left upper lobe lung mass in addition to mediastinal lymphadenopathy diagnosed in June 2021.   PLAN: I had a lengthy discussion with the patient and her son today about her current disease stage, prognosis and treatment options. I personally and independently reviewed the scan images and discussed the result and shared the images with the patient and her son today. I strongly recommend for the patient to complete the staging work-up by ordering a PET scan which is supposed to be done on December 08, 2019 but her son has a conflict with the date and will try to reschedule it. I also recommended for the patient to have MRI of the brain to rule out brain metastasis. I discussed with the patient her treatment options and explained to her that if she has no evidence of metastatic disease outside the currently seeing lesions, she may benefit from a course of concurrent chemoradiation with weekly carboplatin for AUC of 2 and paclitaxel 45 mg/M2. I will arrange for the patient to come back to the cancer center in 2 weeks for evaluation and more detailed discussion of her treatment option after the completion of the staging work-up. I will refer the patient to radiation  oncology for discussion of the radiotherapy option. The patient was advised to call immediately if she has any concerning symptoms in the interval. The patient voices understanding of current disease status and treatment options and is in agreement with the current care plan.  All questions were answered. The patient knows to call the clinic with any problems, questions or concerns. We can certainly see the patient much sooner if necessary.  Thank you so much for allowing me to participate in the care of [facility]. I will continue to follow up the patient with you and assist in her care.  The total time spent in the appointment was 70 minutes.  Disclaimer: This note was dictated with voice recognition software. Similar sounding words can inadvertently be transcribed and may not be corrected upon review.   Eilleen Kempf December 01, 2019, 3:02 PM

## 2019-12-04 ENCOUNTER — Telehealth: Payer: Self-pay | Admitting: Internal Medicine

## 2019-12-04 ENCOUNTER — Encounter: Payer: Self-pay | Admitting: *Deleted

## 2019-12-04 NOTE — Progress Notes (Signed)
Patient is scheduled with for her PET and MRI brain.  I contacted scheduling team to call and schedule her to be see with Dr. Julien Nordmann on 7/15 after her scans are completed.

## 2019-12-04 NOTE — Telephone Encounter (Signed)
Scheduled per 6/25 sch message. Pt is aware of appt time and date added.

## 2019-12-07 ENCOUNTER — Encounter (HOSPITAL_COMMUNITY): Payer: Medicare Other

## 2019-12-08 ENCOUNTER — Ambulatory Visit (HOSPITAL_COMMUNITY)
Admission: RE | Admit: 2019-12-08 | Discharge: 2019-12-08 | Disposition: A | Discharge status: Home / Self Care | Payer: Medicare Other | Source: Ambulatory Visit | Attending: Internal Medicine | Admitting: Internal Medicine

## 2019-12-08 ENCOUNTER — Other Ambulatory Visit: Payer: Self-pay

## 2019-12-08 DIAGNOSIS — C3490 Malignant neoplasm of unspecified part of unspecified bronchus or lung: Secondary | ICD-10-CM | POA: Insufficient documentation

## 2019-12-08 DIAGNOSIS — R918 Other nonspecific abnormal finding of lung field: Secondary | ICD-10-CM | POA: Diagnosis not present

## 2019-12-08 DIAGNOSIS — J320 Chronic maxillary sinusitis: Secondary | ICD-10-CM | POA: Diagnosis not present

## 2019-12-08 DIAGNOSIS — J439 Emphysema, unspecified: Secondary | ICD-10-CM | POA: Diagnosis not present

## 2019-12-08 DIAGNOSIS — I671 Cerebral aneurysm, nonruptured: Secondary | ICD-10-CM | POA: Insufficient documentation

## 2019-12-08 DIAGNOSIS — I2510 Atherosclerotic heart disease of native coronary artery without angina pectoris: Secondary | ICD-10-CM | POA: Insufficient documentation

## 2019-12-08 DIAGNOSIS — I700 Atherosclerosis of aorta: Secondary | ICD-10-CM | POA: Diagnosis not present

## 2019-12-08 LAB — GLUCOSE, CAPILLARY: Glucose-Capillary: 138 mg/dL — ABNORMAL HIGH (ref 70–99)

## 2019-12-08 MED ORDER — GADOBUTROL 1 MMOL/ML IV SOLN
10.0000 mL | Freq: Once | INTRAVENOUS | Status: AC | PRN
  Administered 2019-12-08: 6 mL via INTRAVENOUS

## 2019-12-08 MED ORDER — FLUDEOXYGLUCOSE F - 18 (FDG) INJECTION
7.2100 | Freq: Once | INTRAVENOUS | Status: AC | PRN
  Administered 2019-12-08: 7.21 via INTRAVENOUS

## 2019-12-23 ENCOUNTER — Inpatient Hospital Stay (HOSPITAL_COMMUNITY): Admission: RE | Admit: 2019-12-23 | Payer: Medicare Other | Source: Ambulatory Visit

## 2019-12-24 ENCOUNTER — Other Ambulatory Visit: Payer: Self-pay

## 2019-12-24 ENCOUNTER — Encounter: Payer: Self-pay | Admitting: Internal Medicine

## 2019-12-24 ENCOUNTER — Inpatient Hospital Stay: Payer: Medicare Other | Attending: Internal Medicine | Admitting: Internal Medicine

## 2019-12-24 ENCOUNTER — Inpatient Hospital Stay: Payer: Medicare Other

## 2019-12-24 VITALS — BP 119/62 | HR 107 | Temp 97.9°F | Resp 20 | Ht 66.0 in | Wt 137.8 lb

## 2019-12-24 DIAGNOSIS — J439 Emphysema, unspecified: Secondary | ICD-10-CM | POA: Diagnosis not present

## 2019-12-24 DIAGNOSIS — Z791 Long term (current) use of non-steroidal anti-inflammatories (NSAID): Secondary | ICD-10-CM | POA: Diagnosis not present

## 2019-12-24 DIAGNOSIS — C3412 Malignant neoplasm of upper lobe, left bronchus or lung: Secondary | ICD-10-CM | POA: Insufficient documentation

## 2019-12-24 DIAGNOSIS — J849 Interstitial pulmonary disease, unspecified: Secondary | ICD-10-CM | POA: Insufficient documentation

## 2019-12-24 DIAGNOSIS — R197 Diarrhea, unspecified: Secondary | ICD-10-CM | POA: Diagnosis not present

## 2019-12-24 DIAGNOSIS — Z9049 Acquired absence of other specified parts of digestive tract: Secondary | ICD-10-CM | POA: Diagnosis not present

## 2019-12-24 DIAGNOSIS — E119 Type 2 diabetes mellitus without complications: Secondary | ICD-10-CM | POA: Diagnosis not present

## 2019-12-24 DIAGNOSIS — Z5111 Encounter for antineoplastic chemotherapy: Secondary | ICD-10-CM | POA: Diagnosis not present

## 2019-12-24 DIAGNOSIS — I10 Essential (primary) hypertension: Secondary | ICD-10-CM | POA: Diagnosis not present

## 2019-12-24 DIAGNOSIS — J320 Chronic maxillary sinusitis: Secondary | ICD-10-CM | POA: Insufficient documentation

## 2019-12-24 DIAGNOSIS — Z79899 Other long term (current) drug therapy: Secondary | ICD-10-CM | POA: Insufficient documentation

## 2019-12-24 DIAGNOSIS — Z7901 Long term (current) use of anticoagulants: Secondary | ICD-10-CM | POA: Diagnosis not present

## 2019-12-24 DIAGNOSIS — D539 Nutritional anemia, unspecified: Secondary | ICD-10-CM

## 2019-12-24 DIAGNOSIS — C3492 Malignant neoplasm of unspecified part of left bronchus or lung: Secondary | ICD-10-CM

## 2019-12-24 DIAGNOSIS — K589 Irritable bowel syndrome without diarrhea: Secondary | ICD-10-CM | POA: Diagnosis not present

## 2019-12-24 DIAGNOSIS — I671 Cerebral aneurysm, nonruptured: Secondary | ICD-10-CM | POA: Diagnosis not present

## 2019-12-24 DIAGNOSIS — Z7189 Other specified counseling: Secondary | ICD-10-CM | POA: Diagnosis not present

## 2019-12-24 DIAGNOSIS — Z86010 Personal history of colonic polyps: Secondary | ICD-10-CM | POA: Insufficient documentation

## 2019-12-24 DIAGNOSIS — I700 Atherosclerosis of aorta: Secondary | ICD-10-CM | POA: Insufficient documentation

## 2019-12-24 DIAGNOSIS — R918 Other nonspecific abnormal finding of lung field: Secondary | ICD-10-CM

## 2019-12-24 DIAGNOSIS — E079 Disorder of thyroid, unspecified: Secondary | ICD-10-CM | POA: Diagnosis not present

## 2019-12-24 DIAGNOSIS — R599 Enlarged lymph nodes, unspecified: Secondary | ICD-10-CM | POA: Diagnosis not present

## 2019-12-24 DIAGNOSIS — R053 Chronic cough: Secondary | ICD-10-CM

## 2019-12-24 DIAGNOSIS — R05 Cough: Secondary | ICD-10-CM

## 2019-12-24 LAB — CBC WITH DIFFERENTIAL (CANCER CENTER ONLY)
Abs Immature Granulocytes: 0.03 10*3/uL (ref 0.00–0.07)
Basophils Absolute: 0 10*3/uL (ref 0.0–0.1)
Basophils Relative: 0 %
Eosinophils Absolute: 0.1 10*3/uL (ref 0.0–0.5)
Eosinophils Relative: 1 %
HCT: 29.3 % — ABNORMAL LOW (ref 36.0–46.0)
Hemoglobin: 8.8 g/dL — ABNORMAL LOW (ref 12.0–15.0)
Immature Granulocytes: 0 %
Lymphocytes Relative: 20 %
Lymphs Abs: 1.9 10*3/uL (ref 0.7–4.0)
MCH: 27 pg (ref 26.0–34.0)
MCHC: 30 g/dL (ref 30.0–36.0)
MCV: 89.9 fL (ref 80.0–100.0)
Monocytes Absolute: 0.5 10*3/uL (ref 0.1–1.0)
Monocytes Relative: 6 %
Neutro Abs: 6.9 10*3/uL (ref 1.7–7.7)
Neutrophils Relative %: 73 %
Platelet Count: 361 10*3/uL (ref 150–400)
RBC: 3.26 MIL/uL — ABNORMAL LOW (ref 3.87–5.11)
RDW: 15.9 % — ABNORMAL HIGH (ref 11.5–15.5)
WBC Count: 9.4 10*3/uL (ref 4.0–10.5)
nRBC: 0 % (ref 0.0–0.2)

## 2019-12-24 LAB — CMP (CANCER CENTER ONLY)
ALT: <6 U/L (ref 0–44)
AST: 14 U/L — ABNORMAL LOW (ref 15–41)
Albumin: 2.6 g/dL — ABNORMAL LOW (ref 3.5–5.0)
Alkaline Phosphatase: 77 U/L (ref 38–126)
Anion gap: 10 (ref 5–15)
BUN: 11 mg/dL (ref 8–23)
CO2: 26 mmol/L (ref 22–32)
Calcium: 11.1 mg/dL — ABNORMAL HIGH (ref 8.9–10.3)
Chloride: 102 mmol/L (ref 98–111)
Creatinine: 0.71 mg/dL (ref 0.44–1.00)
GFR, Est AFR Am: >60 mL/min (ref >60)
GFR, Estimated: >60 mL/min (ref >60)
Glucose, Bld: 131 mg/dL — ABNORMAL HIGH (ref 70–99)
Potassium: 4 mmol/L (ref 3.5–5.1)
Sodium: 138 mmol/L (ref 135–145)
Total Bilirubin: 0.3 mg/dL (ref 0.3–1.2)
Total Protein: 8.2 g/dL — ABNORMAL HIGH (ref 6.5–8.1)

## 2019-12-24 MED ORDER — PROCHLORPERAZINE MALEATE 10 MG PO TABS
10.0000 mg | ORAL_TABLET | Freq: Four times a day (QID) | ORAL | 0 refills | Status: AC | PRN
Start: 2019-12-24 — End: ?

## 2019-12-24 NOTE — Patient Instructions (Signed)
Durvalumab injection What is this medicine? DURVALUMAB (dur VAL ue mab) is a monoclonal antibody. It is used to treat urothelial cancer and lung cancer. This medicine may be used for other purposes; ask your health care provider or pharmacist if you have questions. COMMON BRAND NAME(S): IMFINZI What should I tell my health care provider before I take this medicine? They need to know if you have any of these conditions:  diabetes  immune system problems  infection  inflammatory bowel disease  kidney disease  liver disease  lung or breathing disease  lupus  organ transplant  stomach or intestine problems  thyroid disease  an unusual or allergic reaction to durvalumab, other medicines, foods, dyes, or preservatives  pregnant or trying to get pregnant  breast-feeding How should I use this medicine? This medicine is for infusion into a vein. It is given by a health care professional in a hospital or clinic setting. A special MedGuide will be given to you before each treatment. Be sure to read this information carefully each time. Talk to your pediatrician regarding the use of this medicine in children. Special care may be needed. Overdosage: If you think you have taken too much of this medicine contact a poison control center or emergency room at once. NOTE: This medicine is only for you. Do not share this medicine with others. What if I miss a dose? It is important not to miss your dose. Call your doctor or health care professional if you are unable to keep an appointment. What may interact with this medicine? Interactions have not been studied. This list may not describe all possible interactions. Give your health care provider a list of all the medicines, herbs, non-prescription drugs, or dietary supplements you use. Also tell them if you smoke, drink alcohol, or use illegal drugs. Some items may interact with your medicine. What should I watch for while using this  medicine? This drug may make you feel generally unwell. Continue your course of treatment even though you feel ill unless your doctor tells you to stop. You may need blood work done while you are taking this medicine. Do not become pregnant while taking this medicine or for 3 months after stopping it. Women should inform their doctor if they wish to become pregnant or think they might be pregnant. There is a potential for serious side effects to an unborn child. Talk to your health care professional or pharmacist for more information. Do not breast-feed an infant while taking this medicine or for 3 months after stopping it. What side effects may I notice from receiving this medicine? Side effects that you should report to your doctor or health care professional as soon as possible:  allergic reactions like skin rash, itching or hives, swelling of the face, lips, or tongue  black, tarry stools  bloody or watery diarrhea  breathing problems  change in emotions or moods  change in sex drive  changes in vision  chest pain or chest tightness  chills  confusion  cough  facial flushing  fever  headache  signs and symptoms of high blood sugar such as dizziness; dry mouth; dry skin; fruity breath; nausea; stomach pain; increased hunger or thirst; increased urination  signs and symptoms of liver injury like dark yellow or brown urine; general ill feeling or flu-like symptoms; light-colored stools; loss of appetite; nausea; right upper belly pain; unusually weak or tired; yellowing of the eyes or skin  stomach pain  trouble passing urine or change in  the amount of urine  weight gain or weight loss Side effects that usually do not require medical attention (report these to your doctor or health care professional if they continue or are bothersome):  bone pain  constipation  loss of appetite  muscle pain  nausea  swelling of the ankles, feet, hands  tiredness This list  may not describe all possible side effects. Call your doctor for medical advice about side effects. You may report side effects to FDA at 1-800-FDA-1088. Where should I keep my medicine? This drug is given in a hospital or clinic and will not be stored at home. NOTE: This sheet is a summary. It may not cover all possible information. If you have questions about this medicine, talk to your doctor, pharmacist, or health care provider.  2020 Elsevier/Gold Standard (2016-08-07 19:25:04) Paclitaxel injection What is this medicine? PACLITAXEL (PAK li TAX el) is a chemotherapy drug. It targets fast dividing cells, like cancer cells, and causes these cells to die. This medicine is used to treat ovarian cancer, breast cancer, lung cancer, Kaposi's sarcoma, and other cancers. This medicine may be used for other purposes; ask your health care provider or pharmacist if you have questions. COMMON BRAND NAME(S): Onxol, Taxol What should I tell my health care provider before I take this medicine? They need to know if you have any of these conditions:  history of irregular heartbeat  liver disease  low blood counts, like low white cell, platelet, or red cell counts  lung or breathing disease, like asthma  tingling of the fingers or toes, or other nerve disorder  an unusual or allergic reaction to paclitaxel, alcohol, polyoxyethylated castor oil, other chemotherapy, other medicines, foods, dyes, or preservatives  pregnant or trying to get pregnant  breast-feeding How should I use this medicine? This drug is given as an infusion into a vein. It is administered in a hospital or clinic by a specially trained health care professional. Talk to your pediatrician regarding the use of this medicine in children. Special care may be needed. Overdosage: If you think you have taken too much of this medicine contact a poison control center or emergency room at once. NOTE: This medicine is only for you. Do not  share this medicine with others. What if I miss a dose? It is important not to miss your dose. Call your doctor or health care professional if you are unable to keep an appointment. What may interact with this medicine? Do not take this medicine with any of the following medications:  disulfiram  metronidazole This medicine may also interact with the following medications:  antiviral medicines for hepatitis, HIV or AIDS  certain antibiotics like erythromycin and clarithromycin  certain medicines for fungal infections like ketoconazole and itraconazole  certain medicines for seizures like carbamazepine, phenobarbital, phenytoin  gemfibrozil  nefazodone  rifampin  St. John's wort This list may not describe all possible interactions. Give your health care provider a list of all the medicines, herbs, non-prescription drugs, or dietary supplements you use. Also tell them if you smoke, drink alcohol, or use illegal drugs. Some items may interact with your medicine. What should I watch for while using this medicine? Your condition will be monitored carefully while you are receiving this medicine. You will need important blood work done while you are taking this medicine. This medicine can cause serious allergic reactions. To reduce your risk you will need to take other medicine(s) before treatment with this medicine. If you experience allergic reactions  like skin rash, itching or hives, swelling of the face, lips, or tongue, tell your doctor or health care professional right away. In some cases, you may be given additional medicines to help with side effects. Follow all directions for their use. This drug may make you feel generally unwell. This is not uncommon, as chemotherapy can affect healthy cells as well as cancer cells. Report any side effects. Continue your course of treatment even though you feel ill unless your doctor tells you to stop. Call your doctor or health care professional  for advice if you get a fever, chills or sore throat, or other symptoms of a cold or flu. Do not treat yourself. This drug decreases your body's ability to fight infections. Try to avoid being around people who are sick. This medicine may increase your risk to bruise or bleed. Call your doctor or health care professional if you notice any unusual bleeding. Be careful brushing and flossing your teeth or using a toothpick because you may get an infection or bleed more easily. If you have any dental work done, tell your dentist you are receiving this medicine. Avoid taking products that contain aspirin, acetaminophen, ibuprofen, naproxen, or ketoprofen unless instructed by your doctor. These medicines may hide a fever. Do not become pregnant while taking this medicine. Women should inform their doctor if they wish to become pregnant or think they might be pregnant. There is a potential for serious side effects to an unborn child. Talk to your health care professional or pharmacist for more information. Do not breast-feed an infant while taking this medicine. Men are advised not to father a child while receiving this medicine. This product may contain alcohol. Ask your pharmacist or healthcare provider if this medicine contains alcohol. Be sure to tell all healthcare providers you are taking this medicine. Certain medicines, like metronidazole and disulfiram, can cause an unpleasant reaction when taken with alcohol. The reaction includes flushing, headache, nausea, vomiting, sweating, and increased thirst. The reaction can last from 30 minutes to several hours. What side effects may I notice from receiving this medicine? Side effects that you should report to your doctor or health care professional as soon as possible:  allergic reactions like skin rash, itching or hives, swelling of the face, lips, or tongue  breathing problems  changes in vision  fast, irregular heartbeat  high or low blood  pressure  mouth sores  pain, tingling, numbness in the hands or feet  signs of decreased platelets or bleeding - bruising, pinpoint red spots on the skin, black, tarry stools, blood in the urine  signs of decreased red blood cells - unusually weak or tired, feeling faint or lightheaded, falls  signs of infection - fever or chills, cough, sore throat, pain or difficulty passing urine  signs and symptoms of liver injury like dark yellow or brown urine; general ill feeling or flu-like symptoms; light-colored stools; loss of appetite; nausea; right upper belly pain; unusually weak or tired; yellowing of the eyes or skin  swelling of the ankles, feet, hands  unusually slow heartbeat Side effects that usually do not require medical attention (report to your doctor or health care professional if they continue or are bothersome):  diarrhea  hair loss  loss of appetite  muscle or joint pain  nausea, vomiting  pain, redness, or irritation at site where injected  tiredness This list may not describe all possible side effects. Call your doctor for medical advice about side effects. You may report side  effects to FDA at 1-800-FDA-1088. Where should I keep my medicine? This drug is given in a hospital or clinic and will not be stored at home. NOTE: This sheet is a summary. It may not cover all possible information. If you have questions about this medicine, talk to your doctor, pharmacist, or health care provider.  2020 Elsevier/Gold Standard (2017-01-29 13:14:55) Carboplatin injection What is this medicine? CARBOPLATIN (KAR boe pla tin) is a chemotherapy drug. It targets fast dividing cells, like cancer cells, and causes these cells to die. This medicine is used to treat ovarian cancer and many other cancers. This medicine may be used for other purposes; ask your health care provider or pharmacist if you have questions. COMMON BRAND NAME(S): Paraplatin What should I tell my health care  provider before I take this medicine? They need to know if you have any of these conditions:  blood disorders  hearing problems  kidney disease  recent or ongoing radiation therapy  an unusual or allergic reaction to carboplatin, cisplatin, other chemotherapy, other medicines, foods, dyes, or preservatives  pregnant or trying to get pregnant  breast-feeding How should I use this medicine? This drug is usually given as an infusion into a vein. It is administered in a hospital or clinic by a specially trained health care professional. Talk to your pediatrician regarding the use of this medicine in children. Special care may be needed. Overdosage: If you think you have taken too much of this medicine contact a poison control center or emergency room at once. NOTE: This medicine is only for you. Do not share this medicine with others. What if I miss a dose? It is important not to miss a dose. Call your doctor or health care professional if you are unable to keep an appointment. What may interact with this medicine?  medicines for seizures  medicines to increase blood counts like filgrastim, pegfilgrastim, sargramostim  some antibiotics like amikacin, gentamicin, neomycin, streptomycin, tobramycin  vaccines Talk to your doctor or health care professional before taking any of these medicines:  acetaminophen  aspirin  ibuprofen  ketoprofen  naproxen This list may not describe all possible interactions. Give your health care provider a list of all the medicines, herbs, non-prescription drugs, or dietary supplements you use. Also tell them if you smoke, drink alcohol, or use illegal drugs. Some items may interact with your medicine. What should I watch for while using this medicine? Your condition will be monitored carefully while you are receiving this medicine. You will need important blood work done while you are taking this medicine. This drug may make you feel generally  unwell. This is not uncommon, as chemotherapy can affect healthy cells as well as cancer cells. Report any side effects. Continue your course of treatment even though you feel ill unless your doctor tells you to stop. In some cases, you may be given additional medicines to help with side effects. Follow all directions for their use. Call your doctor or health care professional for advice if you get a fever, chills or sore throat, or other symptoms of a cold or flu. Do not treat yourself. This drug decreases your body's ability to fight infections. Try to avoid being around people who are sick. This medicine may increase your risk to bruise or bleed. Call your doctor or health care professional if you notice any unusual bleeding. Be careful brushing and flossing your teeth or using a toothpick because you may get an infection or bleed more easily. If you  have any dental work done, tell your dentist you are receiving this medicine. Avoid taking products that contain aspirin, acetaminophen, ibuprofen, naproxen, or ketoprofen unless instructed by your doctor. These medicines may hide a fever. Do not become pregnant while taking this medicine. Women should inform their doctor if they wish to become pregnant or think they might be pregnant. There is a potential for serious side effects to an unborn child. Talk to your health care professional or pharmacist for more information. Do not breast-feed an infant while taking this medicine. What side effects may I notice from receiving this medicine? Side effects that you should report to your doctor or health care professional as soon as possible:  allergic reactions like skin rash, itching or hives, swelling of the face, lips, or tongue  signs of infection - fever or chills, cough, sore throat, pain or difficulty passing urine  signs of decreased platelets or bleeding - bruising, pinpoint red spots on the skin, black, tarry stools, nosebleeds  signs of  decreased red blood cells - unusually weak or tired, fainting spells, lightheadedness  breathing problems  changes in hearing  changes in vision  chest pain  high blood pressure  low blood counts - This drug may decrease the number of white blood cells, red blood cells and platelets. You may be at increased risk for infections and bleeding.  nausea and vomiting  pain, swelling, redness or irritation at the injection site  pain, tingling, numbness in the hands or feet  problems with balance, talking, walking  trouble passing urine or change in the amount of urine Side effects that usually do not require medical attention (report to your doctor or health care professional if they continue or are bothersome):  hair loss  loss of appetite  metallic taste in the mouth or changes in taste This list may not describe all possible side effects. Call your doctor for medical advice about side effects. You may report side effects to FDA at 1-800-FDA-1088. Where should I keep my medicine? This drug is given in a hospital or clinic and will not be stored at home. NOTE: This sheet is a summary. It may not cover all possible information. If you have questions about this medicine, talk to your doctor, pharmacist, or health care provider.  2020 Elsevier/Gold Standard (2007-09-02 14:38:05) Lung Cancer Lung cancer is an abnormal growth of cancerous cells that forms a mass (malignant tumor) in a lung. There are several types of lung cancer. The types are based on the appearance of the tumor cells. The two most common types are:  Non-small cell lung cancer. This type of lung cancer is the most common type. Non-small cell lung cancers include squamous cell carcinoma, adenocarcinoma, and large cell carcinoma.  Small cell lung cancer. In this type of lung cancer, abnormal cells are smaller than those of non-small cell lung cancer. Small cell lung cancer gets worse (progresses) faster than non-small  cell lung cancer. What are the causes? The most common cause of lung cancer is smoking tobacco. The second most common cause is exposure to a chemical called radon. What increases the risk? You are more likely to develop this condition if:  You smoke tobacco.  You have been exposed to: ? Secondhand tobacco smoke. ? Radon gas. ? Uranium. ? Asbestos. ? Arsenic in drinking water. ? Air pollution.  You have a family or personal history of lung cancer.  You have had lung radiation therapy in the past.  You are older than  age 58. What are the signs or symptoms? In the early stages, you may not have any symptoms. As the cancer progresses, symptoms may include:  A lasting cough, possibly with blood.  Fatigue.  Unexplained weight loss.  Shortness of breath.  Loud breathing (wheezing).  Chest pain.  Loss of appetite. Symptoms of advanced lung cancer include:  Hoarseness.  Bone or joint pain.  Weakness.  Change in the structure of the fingernails (clubbing), so that the nail looks like an upside-down spoon.  Swelling of the face or arms.  Inability to move the face (paralysis).  Drooping eyelids. How is this diagnosed? This condition may be diagnosed based on:  Your symptoms and medical history.  A physical exam.  A chest X-ray.  A CT scan.  Blood tests.  Sputum tests.  Removal of a sample of lung tissue (lung biopsy) for testing. Your cancer will be assessed (staged) to determine how severe it is and how much it has spread (metastasized). How is this treated? Treatment depends on the type and stage of your cancer. Treatment may include one or more of the following:  Surgery to remove as much of the cancer as possible. Lymph nodes in the area may be removed and tested for cancer as well.  Medicines that kill cancer cells (chemotherapy).  High-energy rays that kill cancer cells (radiation therapy).  Chemotherapy. This treatment uses medicines to  destroy cancer cells.  Targeted therapy. This targets specific parts of cancer cells and the area around them to block the growth and spread of the cancer. Targeted therapy can help limit the damage to healthy cells. Follow these instructions at home: Eating and drinking  Some of your treatments might affect your appetite. If you are having problems eating, or if you do not have an appetite, meet with a dietitian.  If you have side effects that affect your appetite, it may help to: ? Eat smaller meals and snacks often. ? Drink high-nutrition and high-calorie shakes or supplements. ? Eat bland and soft foods that are easy to eat. ? Avoid eating foods that are hot, spicy, or hard to swallow. General instructions   Do not use any products that contain nicotine or tobacco, such as cigarettes and e-cigarettes. If you need help quitting, ask your health care provider.  Do not drink alcohol.  If you are admitted to the hospital, make sure your cancer specialist (oncologist) is aware. Your cancer may affect your treatment for other conditions.  Take over-the-counter and prescription medicines only as told by your health care provider.  Consider joining a support group for people who have been diagnosed with lung cancer.  Work with your health care provider to manage any side effects of treatment.  Keep all follow-up visits as told by your health care provider. This is important. Where to find more information  American Cancer Society: https://www.cancer.[place] https://www.cancer.gov Contact a health care provider if you:  Lose weight without trying.  Have a persistent cough and wheezing.  Feel short of breath.  Get tired easily.  Have bone or joint pain.  Have difficulty swallowing.  Notice that your voice is changing or getting hoarse.  Have pain that does not get better with medicine. Get help right away if you:  Cough up blood.  Have  new breathing problems.  Have chest pain.  Have a fever.  Have swelling in an ankle, leg, or arm, or the face or neck.  Have paralysis in your face.  Are very confused.  Have a drooping eyelid. Summary  Lung cancer is an abnormal growth of cancerous cells that forms a mass (malignant tumor) in a lung.  There are several types of lung cancer. The types are based on the appearance of the tumor cells. The two most common types are non-small cell and small cell.  The most common cause of lung cancer is smoking tobacco.  Early symptoms include a lasting cough, possibly with blood, and fatigue, unexplained weight loss, and shortness of breath.  After diagnosis, treatment depends on the type and stage of your cancer. This information is not intended to replace advice given to you by your health care provider. Make sure you discuss any questions you have with your health care provider. Document Revised: 05/10/2017 Document Reviewed: 04/04/2017 Elsevier Patient Education  2020 [employer].

## 2019-12-24 NOTE — Progress Notes (Signed)
[place] [phone]   [phone]  OFFICE PROGRESS NOTE  [name], MD [place] [address]  DIAGNOSIS: Stage IIIb (T4, N3, M0) non-small cell lung cancer, squamous cell carcinoma presented with large left upper lobe lung mass in addition to mediastinal lymphadenopathy diagnosed in June 2021.  The patient also has a history of interstitial lung disease.  PRIOR THERAPY: None  CURRENT THERAPY: A course of concurrent chemoradiation with weekly carboplatin for AUC of 2 and paclitaxel 45 mg/M2.  First dose expected on January 04, 2020.  INTERVAL HISTORY: [name] 79 y.o. female returns to the clinic today for follow-up visit accompanied by her son.  Her son [name] and daughter Juliann Pulse were available by phone during the visit.  The patient is feeling fine today with no concerning complaints except for the baseline shortness of breath.  She denied having any cough or hemoptysis.  She denied having any chest pain.  She has no nausea, vomiting.  She denied having any diarrhea or constipation and no abdominal pain.  The patient has no recent weight loss or night sweats.  She had several studies performed since her last visit including a PET scan as well as MRI of the brain and she is here today for evaluation and discussion of her imaging studies as well as treatment options.  MEDICAL HISTORY: Past Medical History:  Diagnosis Date  . Bronchiectasis    PFTs August 23, 2010 VC 82%   dlco 62 > 129 CORRECTED  . Chronic diarrhea   . Chronic rhinitis    sinus CT ordered August 23, 2010  . Diabetes mellitus ([place])   . Hyperplastic colon polyp   . IBS (irritable bowel syndrome)   . ILD (interstitial lung disease) ([place])   . Stenosis of rectum and anus   . Thyroid disease     ALLERGIES:  is allergic to penicillins, influenza vaccines, clarithromycin, cortisone, and povidone-iodine.  MEDICATIONS:  Current Outpatient Medications    Medication Sig Dispense Refill  . Ascorbic Acid (VITAMIN C) 1000 MG tablet Take 1,000 mg by mouth at bedtime.     . benzonatate (TESSALON) 100 MG capsule TAKE 1 CAPSULE (100 MG TOTAL) BY MOUTH EVERY 8 (EIGHT) HOURS AS NEEDED FOR COUGH. 30 capsule 1  . Biotin 10000 MCG TABS Take 10,000 mcg by mouth daily.     . calcium carbonate (TUMS - DOSED IN MG ELEMENTAL CALCIUM) 500 MG chewable tablet Chew 1 tablet (200 mg of elemental calcium total) by mouth 3 (three) times daily with meals.    . Calcium-Magnesium-Vitamin D (CALCIUM 1200+D3 PO) Take 1 tablet by mouth at bedtime.    . celecoxib (CELEBREX) 200 MG capsule Take 1 capsule (200 mg total) by mouth 2 (two) times daily. 60 capsule 0  . clorazepate (TRANXENE) 3.75 MG tablet TAKE 1 TABLET BY MOUTH DAILY.MAY TAKE UP TO 2 DAILY IF NEEDED 60 tablet 1  . cyclobenzaprine (FLEXERIL) 10 MG tablet Take 10 mg by mouth at bedtime.     . docusate sodium (COLACE) 100 MG capsule Take 1 capsule (100 mg total) by mouth 2 (two) times daily. 10 capsule 0  . ferrous sulfate 325 (65 FE) MG tablet Take 1 tablet (325 mg total) by mouth 3 (three) times daily after meals for 14 days. 42 tablet 0  . HYDROcodone-acetaminophen (NORCO/VICODIN) 5-325 MG tablet Take 1-2 tablets by mouth every 6 (six) hours as needed for moderate pain or severe pain (  pain score 4-6). 42 tablet 0  . levothyroxine (SYNTHROID, LEVOTHROID) 88 MCG tablet Take 88 mcg by mouth daily before breakfast.     . menthol-cetylpyridinium (CEPACOL) 3 MG lozenge Take 1 lozenge (3 mg total) by mouth as needed for sore throat (sore throat). 100 tablet 12  . Multiple Vitamin (MULTIVITAMIN WITH MINERALS) TABS tablet Take 1 tablet by mouth daily.    . Multiple Vitamin (MULTIVITAMIN) tablet Take 1 tablet by mouth daily.      . phenol (CHLORASEPTIC) 1.4 % LIQD Use as directed 1 spray in the mouth or throat as needed for throat irritation / pain.  0  . pioglitazone (ACTOS) 15 MG tablet Take 15 mg by mouth daily.    .  polyvinyl alcohol (LIQUIFILM TEARS) 1.4 % ophthalmic solution Place 1 drop into the left eye 3 (three) times daily as needed for dry eyes. 15 mL 0  . pravastatin (PRAVACHOL) 40 MG tablet TAKE 1 TABLET BY MOUTH EVERY DAY IN THE EVENING 90 tablet 0  . rivaroxaban (XARELTO) 10 MG TABS tablet Take 1 tablet (10 mg total) by mouth daily for 21 days. 21 tablet 0  . verapamil (CALAN) 120 MG tablet Take 0.5 tablets (60 mg total) by mouth 2 (two) times daily. 60 tablet 2   No current facility-administered medications for this visit.    SURGICAL HISTORY:  Past Surgical History:  Procedure Laterality Date  .  gallbaldder    . BRONCHIAL BIOPSY  11/13/2019   Procedure: BRONCHIAL BIOPSIES;  Surgeon: Collene Gobble, MD;  Location: [facility];  Service: Cardiopulmonary;;  . BRONCHIAL BRUSHINGS  11/13/2019   Procedure: BRONCHIAL BRUSHINGS;  Surgeon: Collene Gobble, MD;  Location: [facility];  Service: Cardiopulmonary;;  . CHOLECYSTECTOMY    . DILATION AND CURETTAGE OF UTERUS    . HAND SURGERY  1991   left hand; Baker's Cyst removed  . migraine    . tear duct surgery    . TOTAL HIP ARTHROPLASTY Right 11/04/2019   Procedure: TOTAL HIP ARTHROPLASTY ANTERIOR APPROACH;  Surgeon: Paralee Cancel, MD;  Location: [facility];  Service: Orthopedics;  Laterality: Right;  [name] VIDEO BRONCHOSCOPY N/A 11/13/2019   Procedure: VIDEO BRONCHOSCOPY;  Surgeon: Collene Gobble, MD;  Location: [facility];  Service: Cardiopulmonary;  Laterality: N/A;    REVIEW OF SYSTEMS:  Constitutional: positive for fatigue Eyes: negative Ears, nose, mouth, throat, and face: negative Respiratory: positive for cough and dyspnea on exertion Cardiovascular: negative Gastrointestinal: negative Genitourinary:negative Integument/breast: negative Hematologic/lymphatic: negative Musculoskeletal:negative Neurological: negative Behavioral/Psych: negative Endocrine: negative Allergic/Immunologic: negative   PHYSICAL EXAMINATION: General  appearance: alert, cooperative, fatigued and no distress Head: Normocephalic, without obvious abnormality, atraumatic Neck: no adenopathy, no JVD, supple, symmetrical, trachea midline and thyroid not enlarged, symmetric, no tenderness/mass/nodules Lymph nodes: Cervical, supraclavicular, and axillary nodes normal. Resp: clear to auscultation bilaterally Back: symmetric, no curvature. ROM normal. No CVA tenderness. Cardio: regular rate and rhythm, S1, S2 normal, no murmur, click, rub or gallop GI: soft, non-tender; bowel sounds normal; no masses,  no organomegaly Extremities: extremities normal, atraumatic, no cyanosis or edema Neurologic: Alert and oriented X 3, normal strength and tone. Normal symmetric reflexes. Normal coordination and gait  ECOG PERFORMANCE STATUS: 1 - Symptomatic but completely ambulatory  Blood pressure 119/62, pulse (!) 107, temperature 97.9 F (36.6 C), temperature source Temporal, resp. rate 20, height 5\' 6"  (1.676 m), weight 137 lb 12.8 oz (62.5 kg), SpO2 100 %.  LABORATORY DATA: Lab Results  Component Value Date   WBC 9.4  12/24/2019   HGB 8.8 (L) 12/24/2019   HCT 29.3 (L) 12/24/2019   MCV 89.9 12/24/2019   PLT 361 12/24/2019      Chemistry      Component Value Date/Time   NA 136 11/11/2019 0456   K 4.2 11/11/2019 0456   CL 101 11/11/2019 0456   CO2 26 11/11/2019 0456   BUN 9 11/11/2019 0456   CREATININE 0.73 11/11/2019 0456      Component Value Date/Time   CALCIUM 9.0 11/11/2019 0456   ALKPHOS 79 11/03/2019 1613   AST 22 11/03/2019 1613   ALT 14 11/03/2019 1613   BILITOT 0.4 11/03/2019 1613       RADIOGRAPHIC STUDIES: MR BRAIN W WO CONTRAST  Result Date: 12/08/2019 CLINICAL DATA:  79 year old female with non-small cell lung cancer. Staging. EXAM: MRI HEAD WITHOUT AND WITH CONTRAST TECHNIQUE: Multiplanar, multiecho pulse sequences of the brain and surrounding structures were obtained without and with intravenous contrast. CONTRAST:  32mL  GADAVIST GADOBUTROL 1 MMOL/ML IV SOLN COMPARISON:  PET-CT today and earlier. FINDINGS: Brain: No restricted diffusion to suggest acute infarction. No midline shift, mass effect, evidence of mass lesion, ventriculomegaly, extra-axial collection or acute intracranial hemorrhage. Cervicomedullary junction and pituitary are within normal limits. 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 No dural thickening. Scattered mild for age nonspecific cerebral white matter T2 and FLAIR hyperintensity with no cortical encephalomalacia or definite chronic cerebral blood products. Vascular: Major intracranial vascular flow voids are preserved. The major dural venous sinuses are enhancing and appear to be patent. 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 A right fronto polar arterial branch appears to arise near the base of the aneurysm on series 16, image 61. Skull and upper cervical spine: Negative for age visible cervical spine. No suspicious skull lesion. Sinuses/Orbits: Negative orbits. Chronic appearing left maxillary sinusitis with mucoperiosteal thickening. Other paranasal sinuses are clear. Other: Mastoids are clear. Visible internal auditory structures appear normal. Scalp and face soft tissues appear negative. IMPRESSION: 1. 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 Neuro-endovascular (Neuro-Interventional Radiology or Neurosurgery) consultation is recommended. 2. No other acute intracranial abnormality. Chronic left maxillary sinusitis. These results will be called to the ordering clinician or representative by the Radiologist Assistant, and communication documented in the PACS or [employer]. Electronically Signed   By: Genevie Ann M.D.   On: 12/08/2019 14:29   NM PET Image  Restag (PS) Skull Base To Thigh  Result Date: 12/08/2019 CLINICAL DATA:  Initial treatment strategy for lung mass. EXAM: NUCLEAR MEDICINE PET SKULL BASE TO THIGH TECHNIQUE: 7.21 mCi F-18 FDG was injected intravenously. Full-ring PET imaging was performed from the skull base to thigh after the radiotracer. CT data was obtained and used for attenuation correction and anatomic localization. Fasting blood glucose: 138 mg/dl COMPARISON:  PET-CT from 06/05/2018 FINDINGS: Mediastinal blood pool activity: SUV max 4.5 Liver activity: SUV max NA NECK: No hypermetabolic lymph nodes in the neck. Incidental CT findings: Asymmetric opacification of the left mastoid air cells with evidence of chronic mucoperiosteal thickening. CHEST: The necrotic lobe lung mass is intensely hypermetabolic measuring 6.4 x 5.8 cm and has an SUV max of 19.82. Central photopenia noted compatible with tumor necrosis. Tumor involvement of the left hilum is noted with obstruction of the lingular bronchus resulting in  postobstructive atelectasis and consolidation. On the previous PET-CT mass measured 2.0 x 2.3 cm with SUV max of 14.07. FDG avid left pre-vascular lymph node measures 0.8 cm with SUV max of 4.6. FDG avid left lower paratracheal lymph node measures 1.0 cm within SUV max of 5.13. FDG avid right paratracheal lymph node measures 1.1 cm within SUV max of 4.37. No hypermetabolic supraclavicular or axillary lymph nodes. Incidental CT findings: Paraseptal and centrilobular emphysema. Diffuse, bilateral subpleural reticulation and mild traction bronchiectasis identified suggesting superimposed interstitial lung disease. ABDOMEN/PELVIS: No abnormal FDG uptake within the liver, spleen, pancreas, or adrenal glands. No FDG avid lymph nodes within the abdomen or pelvis. No inguinal adenopathy. Incidental CT findings: Aortic atherosclerosis. No aneurysm. Cholecystectomy. SKELETON: No focal hypermetabolic activity to suggest skeletal metastasis.  Incidental CT findings: none IMPRESSION: 1. Necrotic left upper lobe lung mass is identified which demonstrates considerable increase in size from 06/05/2018. Mass invades the left hilum and there is associated post obstructive atelectasis/consolidation of the lingula. 2. FDG avid bilateral mediastinal lymph nodes worrisome for metastatic adenopathy. 3. No findings of nodal metastasis or solid organ metastasis within the abdomen or pelvis. No findings of bone metastasis. 4. Emphysema with superimposed signs of chronic interstitial lung disease. 5. Aortic atherosclerosis and coronary artery calcifications. 6. Aortic Atherosclerosis (ICD10-I70.0) and Emphysema (ICD10-J43.9). Electronically Signed   By: Kerby Moors M.D.   On: 12/08/2019 14:04    ASSESSMENT AND PLAN: This is a very pleasant 79 years old white female recently diagnosed with stage IIIc (T4, N3, M0) non-small cell lung cancer, squamous cell carcinoma presented with large left upper lobe lung mass in addition to mediastinal lymphadenopathy diagnosed in June 2021.  The patient has complicated history with interstitial lung disease and she was followed by Dr. [name] for many years. I had a lengthy discussion with the patient and her family about her current condition and treatment options.  I discussed with them the scan results. I recommended for the patient a course of concurrent chemoradiation with weekly carboplatin for AUC of 2 and paclitaxel 45 mg/M2.  This will be followed by discussion of consolidation immunotherapy if the patient has no evidence for disease progression but there will be a risk for immunotherapy mediated pneumonitis especially with her previous history of interstitial lung disease.  This would require very close monitoring if she decides to proceed with that treatment. The patient and her family are interested in proceeding with the course of concurrent chemoradiation.  She would like to do her treatment in [place].   She had seen Dr. Mariel Kansky at [facility] and he gave her the same recommendation. I discussed with her the adverse effect of this treatment including but not limited to alopecia, myelosuppression, nausea, vomiting, renal or hepatic dysfunction. The patient is expected to start the first cycle of this treatment on December 05, 2019. I will refer the patient to radiation oncology for evaluation and discussion of the radiation therapy. For the brain aneurysm seen on the recent MRI of the brain I will refer the patient to interventional radiology for evaluation and discussion of her treatment options. The patient will have a chemotherapy education class before the first dose of her treatment.  I will send prescription for Compazine to her pharmacy. She will come back for follow-up visit 1 week after the start of her treatment for evaluation and management of any adverse effect of her treatment. The patient was advised to call immediately if she has any concerning complaints. The patient  voices understanding of current disease status and treatment options and is in agreement with the current care plan.  All questions were answered. The patient knows to call the clinic with any problems, questions or concerns. We can certainly see the patient much sooner if necessary.  The total time spent in the appointment was 60 minutes.  Disclaimer: This note was dictated with voice recognition software. Similar sounding words can inadvertently be transcribed and may not be corrected upon review.

## 2019-12-24 NOTE — Progress Notes (Signed)
START ON PATHWAY REGIMEN - Non-Small Cell Lung     Administer weekly:     Paclitaxel      Carboplatin   **Always confirm dose/schedule in your pharmacy ordering system**  Patient Characteristics: Preoperative or Nonsurgical Candidate (Clinical Staging), Stage III - Nonsurgical Candidate (Nonsquamous and Squamous), PS = 0, 1 Therapeutic Status: Preoperative or Nonsurgical Candidate (Clinical Staging) AJCC T Category: cT4 AJCC N Category: cN3 AJCC M Category: cM0 AJCC 8 Stage Grouping: IIIC ECOG Performance Status: 1 Intent of Therapy: Curative Intent, Discussed with Patient

## 2019-12-25 ENCOUNTER — Telehealth (HOSPITAL_COMMUNITY): Payer: Self-pay | Admitting: Radiology

## 2019-12-25 NOTE — Telephone Encounter (Signed)
Called pt to schedule her consult with Dr. Karenann Cai. Pt requests me to call back after 4 pm when her son is with her. Will call back. JM

## 2019-12-29 ENCOUNTER — Telehealth (HOSPITAL_COMMUNITY): Payer: Self-pay | Admitting: Radiology

## 2019-12-29 NOTE — Progress Notes (Signed)
Error, appointment canceled.

## 2019-12-29 NOTE — Telephone Encounter (Signed)
Called pt to schedule brain aneurysm consultation with Dr. Debbrah Alar. No answer, left VM for her to call me back. JM

## 2019-12-30 ENCOUNTER — Telehealth: Payer: Self-pay | Admitting: *Deleted

## 2019-12-30 ENCOUNTER — Ambulatory Visit
Admission: RE | Admit: 2019-12-30 | Discharge: 2019-12-30 | Disposition: A | Discharge status: Home / Self Care | Payer: Medicare Other | Source: Ambulatory Visit | Attending: Radiation Oncology | Admitting: Radiation Oncology

## 2019-12-30 ENCOUNTER — Ambulatory Visit: Payer: Medicare Other

## 2019-12-30 NOTE — Telephone Encounter (Signed)
Patient's husband called and left a voicemail to cancel appointment for this morning. Patient will be having treatment at [facility].

## 2019-12-31 ENCOUNTER — Inpatient Hospital Stay: Payer: Medicare Other

## 2019-12-31 ENCOUNTER — Inpatient Hospital Stay (HOSPITAL_COMMUNITY): Payer: Medicare Other | Admitting: Anesthesiology

## 2019-12-31 ENCOUNTER — Emergency Department (HOSPITAL_COMMUNITY): Payer: Medicare Other

## 2019-12-31 ENCOUNTER — Inpatient Hospital Stay (HOSPITAL_COMMUNITY): Payer: Medicare Other

## 2019-12-31 ENCOUNTER — Other Ambulatory Visit: Payer: Self-pay

## 2019-12-31 ENCOUNTER — Encounter (HOSPITAL_COMMUNITY)
Admission: EM | Disposition: A | Discharge status: Skilled Nursing Facility | Payer: Self-pay | Source: Home / Self Care | Attending: Internal Medicine

## 2019-12-31 ENCOUNTER — Inpatient Hospital Stay (HOSPITAL_COMMUNITY)
Admission: EM | Admit: 2019-12-31 | Discharge: 2020-01-10 | DRG: 521 | Disposition: A | Discharge status: Skilled Nursing Facility | Payer: Medicare Other | Attending: Family Medicine | Admitting: Family Medicine

## 2019-12-31 ENCOUNTER — Encounter (HOSPITAL_COMMUNITY): Payer: Self-pay | Admitting: Emergency Medicine

## 2019-12-31 DIAGNOSIS — Z96641 Presence of right artificial hip joint: Secondary | ICD-10-CM | POA: Diagnosis present

## 2019-12-31 DIAGNOSIS — R0789 Other chest pain: Secondary | ICD-10-CM | POA: Diagnosis not present

## 2019-12-31 DIAGNOSIS — R52 Pain, unspecified: Secondary | ICD-10-CM

## 2019-12-31 DIAGNOSIS — R059 Cough, unspecified: Secondary | ICD-10-CM

## 2019-12-31 DIAGNOSIS — R296 Repeated falls: Secondary | ICD-10-CM | POA: Diagnosis present

## 2019-12-31 DIAGNOSIS — E785 Hyperlipidemia, unspecified: Secondary | ICD-10-CM | POA: Diagnosis present

## 2019-12-31 DIAGNOSIS — J9621 Acute and chronic respiratory failure with hypoxia: Secondary | ICD-10-CM | POA: Diagnosis present

## 2019-12-31 DIAGNOSIS — I671 Cerebral aneurysm, nonruptured: Secondary | ICD-10-CM | POA: Diagnosis present

## 2019-12-31 DIAGNOSIS — Z7984 Long term (current) use of oral hypoglycemic drugs: Secondary | ICD-10-CM

## 2019-12-31 DIAGNOSIS — I361 Nonrheumatic tricuspid (valve) insufficiency: Secondary | ICD-10-CM | POA: Diagnosis not present

## 2019-12-31 DIAGNOSIS — S52532A Colles' fracture of left radius, initial encounter for closed fracture: Secondary | ICD-10-CM

## 2019-12-31 DIAGNOSIS — Z20822 Contact with and (suspected) exposure to covid-19: Secondary | ICD-10-CM | POA: Diagnosis present

## 2019-12-31 DIAGNOSIS — W19XXXA Unspecified fall, initial encounter: Secondary | ICD-10-CM | POA: Diagnosis not present

## 2019-12-31 DIAGNOSIS — I471 Supraventricular tachycardia: Secondary | ICD-10-CM | POA: Diagnosis not present

## 2019-12-31 DIAGNOSIS — T380X5A Adverse effect of glucocorticoids and synthetic analogues, initial encounter: Secondary | ICD-10-CM | POA: Diagnosis not present

## 2019-12-31 DIAGNOSIS — I4892 Unspecified atrial flutter: Secondary | ICD-10-CM | POA: Diagnosis not present

## 2019-12-31 DIAGNOSIS — E1165 Type 2 diabetes mellitus with hyperglycemia: Secondary | ICD-10-CM | POA: Diagnosis not present

## 2019-12-31 DIAGNOSIS — S52572A Other intraarticular fracture of lower end of left radius, initial encounter for closed fracture: Secondary | ICD-10-CM | POA: Diagnosis present

## 2019-12-31 DIAGNOSIS — I509 Heart failure, unspecified: Secondary | ICD-10-CM

## 2019-12-31 DIAGNOSIS — Z9981 Dependence on supplemental oxygen: Secondary | ICD-10-CM | POA: Diagnosis not present

## 2019-12-31 DIAGNOSIS — D630 Anemia in neoplastic disease: Secondary | ICD-10-CM | POA: Diagnosis present

## 2019-12-31 DIAGNOSIS — Z6821 Body mass index (BMI) 21.0-21.9, adult: Secondary | ICD-10-CM

## 2019-12-31 DIAGNOSIS — E7800 Pure hypercholesterolemia, unspecified: Secondary | ICD-10-CM | POA: Diagnosis present

## 2019-12-31 DIAGNOSIS — E039 Hypothyroidism, unspecified: Secondary | ICD-10-CM | POA: Diagnosis present

## 2019-12-31 DIAGNOSIS — S52502A Unspecified fracture of the lower end of left radius, initial encounter for closed fracture: Secondary | ICD-10-CM

## 2019-12-31 DIAGNOSIS — J8410 Pulmonary fibrosis, unspecified: Secondary | ICD-10-CM | POA: Diagnosis present

## 2019-12-31 DIAGNOSIS — Y92009 Unspecified place in unspecified non-institutional (private) residence as the place of occurrence of the external cause: Secondary | ICD-10-CM

## 2019-12-31 DIAGNOSIS — Z96649 Presence of unspecified artificial hip joint: Secondary | ICD-10-CM

## 2019-12-31 DIAGNOSIS — W010XXA Fall on same level from slipping, tripping and stumbling without subsequent striking against object, initial encounter: Secondary | ICD-10-CM | POA: Diagnosis present

## 2019-12-31 DIAGNOSIS — E43 Unspecified severe protein-calorie malnutrition: Secondary | ICD-10-CM | POA: Diagnosis present

## 2019-12-31 DIAGNOSIS — C3492 Malignant neoplasm of unspecified part of left bronchus or lung: Secondary | ICD-10-CM

## 2019-12-31 DIAGNOSIS — Z419 Encounter for procedure for purposes other than remedying health state, unspecified: Secondary | ICD-10-CM

## 2019-12-31 DIAGNOSIS — S52302A Unspecified fracture of shaft of left radius, initial encounter for closed fracture: Secondary | ICD-10-CM | POA: Diagnosis present

## 2019-12-31 DIAGNOSIS — S62102A Fracture of unspecified carpal bone, left wrist, initial encounter for closed fracture: Secondary | ICD-10-CM | POA: Diagnosis not present

## 2019-12-31 DIAGNOSIS — Y92018 Other place in single-family (private) house as the place of occurrence of the external cause: Secondary | ICD-10-CM

## 2019-12-31 DIAGNOSIS — Z833 Family history of diabetes mellitus: Secondary | ICD-10-CM

## 2019-12-31 DIAGNOSIS — I110 Hypertensive heart disease with heart failure: Secondary | ICD-10-CM | POA: Diagnosis present

## 2019-12-31 DIAGNOSIS — I2510 Atherosclerotic heart disease of native coronary artery without angina pectoris: Secondary | ICD-10-CM | POA: Diagnosis present

## 2019-12-31 DIAGNOSIS — Z8249 Family history of ischemic heart disease and other diseases of the circulatory system: Secondary | ICD-10-CM

## 2019-12-31 DIAGNOSIS — I5030 Unspecified diastolic (congestive) heart failure: Secondary | ICD-10-CM | POA: Diagnosis present

## 2019-12-31 DIAGNOSIS — S52612A Displaced fracture of left ulna styloid process, initial encounter for closed fracture: Secondary | ICD-10-CM | POA: Diagnosis present

## 2019-12-31 DIAGNOSIS — Z79899 Other long term (current) drug therapy: Secondary | ICD-10-CM

## 2019-12-31 DIAGNOSIS — I484 Atypical atrial flutter: Secondary | ICD-10-CM | POA: Diagnosis not present

## 2019-12-31 DIAGNOSIS — Z8719 Personal history of other diseases of the digestive system: Secondary | ICD-10-CM

## 2019-12-31 DIAGNOSIS — I480 Paroxysmal atrial fibrillation: Secondary | ICD-10-CM | POA: Diagnosis not present

## 2019-12-31 DIAGNOSIS — Z880 Allergy status to penicillin: Secondary | ICD-10-CM

## 2019-12-31 DIAGNOSIS — M25562 Pain in left knee: Secondary | ICD-10-CM | POA: Diagnosis present

## 2019-12-31 DIAGNOSIS — Z87891 Personal history of nicotine dependence: Secondary | ICD-10-CM

## 2019-12-31 DIAGNOSIS — E1169 Type 2 diabetes mellitus with other specified complication: Secondary | ICD-10-CM | POA: Diagnosis present

## 2019-12-31 DIAGNOSIS — C3412 Malignant neoplasm of upper lobe, left bronchus or lung: Secondary | ICD-10-CM | POA: Diagnosis present

## 2019-12-31 DIAGNOSIS — S72042A Displaced fracture of base of neck of left femur, initial encounter for closed fracture: Secondary | ICD-10-CM

## 2019-12-31 DIAGNOSIS — Z887 Allergy status to serum and vaccine status: Secondary | ICD-10-CM

## 2019-12-31 DIAGNOSIS — Z8371 Family history of colonic polyps: Secondary | ICD-10-CM

## 2019-12-31 DIAGNOSIS — J479 Bronchiectasis, uncomplicated: Secondary | ICD-10-CM | POA: Diagnosis present

## 2019-12-31 DIAGNOSIS — D509 Iron deficiency anemia, unspecified: Secondary | ICD-10-CM

## 2019-12-31 DIAGNOSIS — Z888 Allergy status to other drugs, medicaments and biological substances status: Secondary | ICD-10-CM

## 2019-12-31 DIAGNOSIS — I5031 Acute diastolic (congestive) heart failure: Secondary | ICD-10-CM | POA: Diagnosis not present

## 2019-12-31 DIAGNOSIS — R54 Age-related physical debility: Secondary | ICD-10-CM | POA: Diagnosis present

## 2019-12-31 DIAGNOSIS — Z881 Allergy status to other antibiotic agents status: Secondary | ICD-10-CM

## 2019-12-31 DIAGNOSIS — Z79890 Hormone replacement therapy (postmenopausal): Secondary | ICD-10-CM

## 2019-12-31 DIAGNOSIS — S72002A Fracture of unspecified part of neck of left femur, initial encounter for closed fracture: Secondary | ICD-10-CM | POA: Diagnosis present

## 2019-12-31 DIAGNOSIS — R05 Cough: Secondary | ICD-10-CM

## 2019-12-31 HISTORY — PX: TOTAL HIP ARTHROPLASTY: SHX124

## 2019-12-31 HISTORY — PX: OPEN REDUCTION INTERNAL FIXATION (ORIF) DISTAL RADIAL FRACTURE: SHX5989

## 2019-12-31 LAB — COMPREHENSIVE METABOLIC PANEL
ALT: 9 U/L (ref 0–44)
AST: 30 U/L (ref 15–41)
Albumin: 2.4 g/dL — ABNORMAL LOW (ref 3.5–5.0)
Alkaline Phosphatase: 58 U/L (ref 38–126)
Anion gap: 8 (ref 5–15)
BUN: 9 mg/dL (ref 8–23)
CO2: 26 mmol/L (ref 22–32)
Calcium: 9.4 mg/dL (ref 8.9–10.3)
Chloride: 104 mmol/L (ref 98–111)
Creatinine, Ser: 0.5 mg/dL (ref 0.44–1.00)
GFR calc Af Amer: >60 mL/min (ref >60)
GFR calc non Af Amer: >60 mL/min (ref >60)
Glucose, Bld: 130 mg/dL — ABNORMAL HIGH (ref 70–99)
Potassium: 3.7 mmol/L (ref 3.5–5.1)
Sodium: 138 mmol/L (ref 135–145)
Total Bilirubin: 0.8 mg/dL (ref 0.3–1.2)
Total Protein: 6.7 g/dL (ref 6.5–8.1)

## 2019-12-31 LAB — PROTIME-INR
INR: 1.2 (ref 0.8–1.2)
Prothrombin Time: 14.5 seconds (ref 11.4–15.2)

## 2019-12-31 LAB — CBC WITH DIFFERENTIAL/PLATELET
Abs Immature Granulocytes: 0.05 10*3/uL (ref 0.00–0.07)
Basophils Absolute: 0 10*3/uL (ref 0.0–0.1)
Basophils Relative: 0 %
Eosinophils Absolute: 0.1 10*3/uL (ref 0.0–0.5)
Eosinophils Relative: 1 %
HCT: 27 % — ABNORMAL LOW (ref 36.0–46.0)
Hemoglobin: 8.1 g/dL — ABNORMAL LOW (ref 12.0–15.0)
Immature Granulocytes: 1 %
Lymphocytes Relative: 14 %
Lymphs Abs: 1.1 10*3/uL (ref 0.7–4.0)
MCH: 27.6 pg (ref 26.0–34.0)
MCHC: 30 g/dL (ref 30.0–36.0)
MCV: 91.8 fL (ref 80.0–100.0)
Monocytes Absolute: 0.5 10*3/uL (ref 0.1–1.0)
Monocytes Relative: 5 %
Neutro Abs: 6.6 10*3/uL (ref 1.7–7.7)
Neutrophils Relative %: 79 %
Platelets: 300 10*3/uL (ref 150–400)
RBC: 2.94 MIL/uL — ABNORMAL LOW (ref 3.87–5.11)
RDW: 15.4 % (ref 11.5–15.5)
WBC: 8.4 10*3/uL (ref 4.0–10.5)
nRBC: 0 % (ref 0.0–0.2)

## 2019-12-31 LAB — TYPE AND SCREEN
ABO/RH(D): B POS
Antibody Screen: NEGATIVE

## 2019-12-31 LAB — GLUCOSE, CAPILLARY
Glucose-Capillary: 112 mg/dL — ABNORMAL HIGH (ref 70–99)
Glucose-Capillary: 135 mg/dL — ABNORMAL HIGH (ref 70–99)
Glucose-Capillary: 113 mg/dL — ABNORMAL HIGH (ref 70–99)

## 2019-12-31 LAB — HEMOGLOBIN A1C
Hgb A1c MFr Bld: 5.8 % — ABNORMAL HIGH (ref 4.8–5.6)
Mean Plasma Glucose: 119.76 mg/dL

## 2019-12-31 LAB — PREPARE RBC (CROSSMATCH)

## 2019-12-31 LAB — SARS CORONAVIRUS 2 BY RT PCR (HOSPITAL ORDER, PERFORMED IN ~~LOC~~ HOSPITAL LAB): SARS Coronavirus 2: NEGATIVE

## 2019-12-31 SURGERY — ARTHROPLASTY, HIP, TOTAL, ANTERIOR APPROACH
Anesthesia: Spinal | Site: Hip | Laterality: Left

## 2019-12-31 MED ORDER — POVIDONE-IODINE 10 % EX SWAB: 2.0000 "application " | Freq: Once | CUTANEOUS | Status: DC

## 2019-12-31 MED ORDER — MUPIROCIN 2 % EX OINT: 1.0000 "application " | TOPICAL_OINTMENT | Freq: Once | CUTANEOUS | Status: AC

## 2019-12-31 MED ORDER — MAGNESIUM CITRATE PO SOLN: 1.0000 | Freq: Once | ORAL | Status: DC | PRN

## 2019-12-31 MED ORDER — ORAL CARE MOUTH RINSE: 15.0000 mL | Freq: Once | OROMUCOSAL | Status: AC

## 2019-12-31 MED ORDER — BUPIVACAINE-EPINEPHRINE (PF) 0.25% -1:200000 IJ SOLN
INTRAMUSCULAR | Status: AC
  Filled 2019-12-31: qty 20

## 2019-12-31 MED ORDER — ENSURE PRE-SURGERY PO LIQD: 296.0000 mL | Freq: Once | ORAL | Status: DC

## 2019-12-31 MED ORDER — KETAMINE HCL 50 MG/5ML IJ SOSY
PREFILLED_SYRINGE | INTRAMUSCULAR | Status: AC
  Filled 2019-12-31: qty 5

## 2019-12-31 MED ORDER — MUPIROCIN 2 % EX OINT
TOPICAL_OINTMENT | CUTANEOUS | Status: AC
  Administered 2019-12-31: 1 via TOPICAL
  Filled 2019-12-31: qty 22

## 2019-12-31 MED ORDER — ONDANSETRON HCL 4 MG/2ML IJ SOLN
INTRAMUSCULAR | Status: AC
  Filled 2019-12-31: qty 2

## 2019-12-31 MED ORDER — TRANEXAMIC ACID 1000 MG/10ML IV SOLN
INTRAVENOUS | Status: DC | PRN
  Administered 2019-12-31: 2000 mg via TOPICAL

## 2019-12-31 MED ORDER — 0.9 % SODIUM CHLORIDE (POUR BTL) OPTIME
TOPICAL | Status: DC | PRN
  Administered 2019-12-31: 1000 mL

## 2019-12-31 MED ORDER — CHLORHEXIDINE GLUCONATE 0.12 % MT SOLN
OROMUCOSAL | Status: AC
  Filled 2019-12-31: qty 15

## 2019-12-31 MED ORDER — HYDROCODONE-ACETAMINOPHEN 5-325 MG PO TABS
1.0000 | ORAL_TABLET | Freq: Four times a day (QID) | ORAL | Status: DC | PRN
  Administered 2019-12-31 – 2020-01-01 (×2): 1 via ORAL
  Filled 2019-12-31 (×4): qty 1

## 2019-12-31 MED ORDER — PROPOFOL 500 MG/50ML IV EMUL
INTRAVENOUS | Status: DC | PRN
Start: 2019-12-31 — End: 2019-12-31
  Administered 2019-12-31: 25 ug/kg/min via INTRAVENOUS

## 2019-12-31 MED ORDER — OXYCODONE HCL 5 MG PO TABS: 5.0000 mg | ORAL_TABLET | Freq: Once | ORAL | Status: DC | PRN

## 2019-12-31 MED ORDER — PROPOFOL 10 MG/ML IV BOLUS
INTRAVENOUS | Status: DC | PRN
  Administered 2019-12-31: 20 mg via INTRAVENOUS
  Administered 2019-12-31: 30 mg via INTRAVENOUS
  Administered 2019-12-31 (×2): 20 mg via INTRAVENOUS

## 2019-12-31 MED ORDER — IRRISEPT - 450ML BOTTLE WITH 0.05% CHG IN STERILE WATER, USP 99.95% OPTIME
TOPICAL | Status: DC | PRN
  Administered 2019-12-31: 450 mL via TOPICAL

## 2019-12-31 MED ORDER — HEPARIN SODIUM (PORCINE) 5000 UNIT/ML IJ SOLN: 5000.0000 [IU] | Freq: Three times a day (TID) | INTRAMUSCULAR | Status: DC

## 2019-12-31 MED ORDER — LEVOTHYROXINE SODIUM 88 MCG PO TABS
88.0000 ug | ORAL_TABLET | Freq: Every day | ORAL | Status: DC
  Administered 2020-01-01 – 2020-01-10 (×10): 88 ug via ORAL
  Filled 2019-12-31 (×11): qty 1

## 2019-12-31 MED ORDER — FERROUS SULFATE 325 (65 FE) MG PO TABS
325.0000 mg | ORAL_TABLET | Freq: Every day | ORAL | Status: DC
  Administered 2020-01-01 – 2020-01-05 (×4): 325 mg via ORAL
  Filled 2019-12-31 (×5): qty 1

## 2019-12-31 MED ORDER — POLYETHYLENE GLYCOL 3350 17 G PO PACK
17.0000 g | PACK | Freq: Every day | ORAL | Status: DC | PRN
  Administered 2020-01-04 – 2020-01-06 (×2): 17 g via ORAL
  Filled 2019-12-31 (×3): qty 1

## 2019-12-31 MED ORDER — METHOCARBAMOL 500 MG PO TABS
500.0000 mg | ORAL_TABLET | Freq: Four times a day (QID) | ORAL | Status: DC | PRN
  Administered 2020-01-01 – 2020-01-07 (×2): 500 mg via ORAL
  Filled 2019-12-31 (×2): qty 1

## 2019-12-31 MED ORDER — LACTATED RINGERS IV SOLN: INTRAVENOUS | Status: DC

## 2019-12-31 MED ORDER — SODIUM CHLORIDE 0.9 % IV SOLN
INTRAVENOUS | Status: DC | PRN
  Administered 2019-12-31: 40 mL

## 2019-12-31 MED ORDER — MORPHINE SULFATE (PF) 2 MG/ML IV SOLN: 1.0000 mg | INTRAVENOUS | Status: DC | PRN

## 2019-12-31 MED ORDER — ACETAMINOPHEN 500 MG PO TABS
500.0000 mg | ORAL_TABLET | Freq: Four times a day (QID) | ORAL | Status: AC
  Administered 2019-12-31 – 2020-01-01 (×4): 500 mg via ORAL
  Filled 2019-12-31 (×4): qty 1

## 2019-12-31 MED ORDER — ACETAMINOPHEN 500 MG PO TABS
1000.0000 mg | ORAL_TABLET | Freq: Once | ORAL | Status: AC
  Administered 2019-12-31: 1000 mg via ORAL
  Filled 2019-12-31: qty 2

## 2019-12-31 MED ORDER — ONDANSETRON HCL 4 MG/2ML IJ SOLN: 4.0000 mg | Freq: Four times a day (QID) | INTRAMUSCULAR | Status: DC | PRN

## 2019-12-31 MED ORDER — CHLORHEXIDINE GLUCONATE 0.12 % MT SOLN
15.0000 mL | Freq: Once | OROMUCOSAL | Status: AC
  Administered 2019-12-31: 15 mL via OROMUCOSAL

## 2019-12-31 MED ORDER — ENOXAPARIN SODIUM 40 MG/0.4ML ~~LOC~~ SOLN: 40.0000 mg | Freq: Every day | SUBCUTANEOUS | 13 refills | Status: AC

## 2019-12-31 MED ORDER — BUPIVACAINE LIPOSOME 1.3 % IJ SUSP
20.0000 mL | Freq: Once | INTRAMUSCULAR | Status: DC
  Filled 2019-12-31: qty 20

## 2019-12-31 MED ORDER — SODIUM CHLORIDE 0.9 % IV SOLN: INTRAVENOUS | Status: DC

## 2019-12-31 MED ORDER — MENTHOL 3 MG MT LOZG
1.0000 | LOZENGE | OROMUCOSAL | Status: DC | PRN
  Filled 2019-12-31 (×2): qty 9

## 2019-12-31 MED ORDER — PHENOL 1.4 % MT LIQD: 1.0000 | OROMUCOSAL | Status: DC | PRN

## 2019-12-31 MED ORDER — CEFAZOLIN SODIUM-DEXTROSE 2-4 GM/100ML-% IV SOLN
2.0000 g | Freq: Four times a day (QID) | INTRAVENOUS | Status: AC
  Administered 2019-12-31 – 2020-01-01 (×3): 2 g via INTRAVENOUS
  Filled 2019-12-31 (×3): qty 100

## 2019-12-31 MED ORDER — ACETAMINOPHEN 325 MG PO TABS
325.0000 mg | ORAL_TABLET | Freq: Four times a day (QID) | ORAL | Status: DC | PRN
  Administered 2020-01-01: 325 mg via ORAL
  Administered 2020-01-02 – 2020-01-10 (×5): 650 mg via ORAL
  Filled 2019-12-31 (×5): qty 2
  Filled 2019-12-31: qty 1

## 2019-12-31 MED ORDER — SORBITOL 70 % SOLN: 30.0000 mL | Freq: Every day | Status: DC | PRN

## 2019-12-31 MED ORDER — PROPOFOL 10 MG/ML IV BOLUS
INTRAVENOUS | Status: AC
  Filled 2019-12-31: qty 20

## 2019-12-31 MED ORDER — PHENYLEPHRINE 40 MCG/ML (10ML) SYRINGE FOR IV PUSH (FOR BLOOD PRESSURE SUPPORT)
PREFILLED_SYRINGE | INTRAVENOUS | Status: AC
  Filled 2019-12-31: qty 10

## 2019-12-31 MED ORDER — ONDANSETRON HCL 4 MG PO TABS: 4.0000 mg | ORAL_TABLET | Freq: Four times a day (QID) | ORAL | Status: DC | PRN

## 2019-12-31 MED ORDER — ALUM & MAG HYDROXIDE-SIMETH 200-200-20 MG/5ML PO SUSP: 30.0000 mL | ORAL | Status: DC | PRN

## 2019-12-31 MED ORDER — SENNOSIDES-DOCUSATE SODIUM 8.6-50 MG PO TABS: 1.0000 | ORAL_TABLET | Freq: Every evening | ORAL | Status: DC | PRN

## 2019-12-31 MED ORDER — OXYCODONE HCL 5 MG/5ML PO SOLN: 5.0000 mg | Freq: Once | ORAL | Status: DC | PRN

## 2019-12-31 MED ORDER — PRAVASTATIN SODIUM 40 MG PO TABS
40.0000 mg | ORAL_TABLET | Freq: Every day | ORAL | Status: DC
  Administered 2020-01-01 – 2020-01-10 (×9): 40 mg via ORAL
  Filled 2019-12-31 (×10): qty 1

## 2019-12-31 MED ORDER — BUPIVACAINE HCL (PF) 0.25 % IJ SOLN
INTRAMUSCULAR | Status: AC
  Filled 2019-12-31: qty 30

## 2019-12-31 MED ORDER — HYDROCODONE-ACETAMINOPHEN 5-325 MG PO TABS
1.0000 | ORAL_TABLET | ORAL | Status: DC | PRN
  Administered 2020-01-01 – 2020-01-03 (×4): 1 via ORAL
  Filled 2019-12-31: qty 2
  Filled 2019-12-31: qty 1

## 2019-12-31 MED ORDER — FENTANYL CITRATE (PF) 100 MCG/2ML IJ SOLN: 25.0000 ug | INTRAMUSCULAR | Status: DC | PRN

## 2019-12-31 MED ORDER — SODIUM CHLORIDE 0.9 % IR SOLN
Status: DC | PRN
  Administered 2019-12-31: 3000 mL

## 2019-12-31 MED ORDER — ONDANSETRON HCL 4 MG/2ML IJ SOLN: 4.0000 mg | Freq: Once | INTRAMUSCULAR | Status: DC | PRN

## 2019-12-31 MED ORDER — ONDANSETRON HCL 4 MG/2ML IJ SOLN
INTRAMUSCULAR | Status: DC | PRN
  Administered 2019-12-31: 4 mg via INTRAVENOUS

## 2019-12-31 MED ORDER — TRANEXAMIC ACID-NACL 1000-0.7 MG/100ML-% IV SOLN
1000.0000 mg | INTRAVENOUS | Status: AC
  Administered 2019-12-31: 1000 mg via INTRAVENOUS
  Filled 2019-12-31: qty 100

## 2019-12-31 MED ORDER — FENTANYL CITRATE (PF) 250 MCG/5ML IJ SOLN
INTRAMUSCULAR | Status: DC | PRN
  Administered 2019-12-31 (×2): 50 ug via INTRAVENOUS

## 2019-12-31 MED ORDER — VORTIOXETINE HBR 5 MG PO TABS
10.0000 mg | ORAL_TABLET | Freq: Every day | ORAL | Status: DC
  Administered 2019-12-31 – 2020-01-10 (×10): 10 mg via ORAL
  Filled 2019-12-31 (×11): qty 2

## 2019-12-31 MED ORDER — FENTANYL CITRATE (PF) 250 MCG/5ML IJ SOLN
INTRAMUSCULAR | Status: AC
  Filled 2019-12-31: qty 5

## 2019-12-31 MED ORDER — KETAMINE HCL 10 MG/ML IJ SOLN
INTRAMUSCULAR | Status: DC | PRN
Start: 2019-12-31 — End: 2019-12-31
  Administered 2019-12-31 (×3): 10 mg via INTRAVENOUS
  Administered 2019-12-31: 20 mg via INTRAVENOUS

## 2019-12-31 MED ORDER — SODIUM CHLORIDE 0.9% IV SOLUTION: Freq: Once | INTRAVENOUS | Status: DC

## 2019-12-31 MED ORDER — VANCOMYCIN HCL 1000 MG IV SOLR
INTRAVENOUS | Status: AC
  Filled 2019-12-31: qty 1000

## 2019-12-31 MED ORDER — DOCUSATE SODIUM 100 MG PO CAPS
100.0000 mg | ORAL_CAPSULE | Freq: Two times a day (BID) | ORAL | Status: DC
  Administered 2019-12-31 – 2020-01-10 (×19): 100 mg via ORAL
  Filled 2019-12-31 (×20): qty 1

## 2019-12-31 MED ORDER — PHENYLEPHRINE HCL-NACL 10-0.9 MG/250ML-% IV SOLN
INTRAVENOUS | Status: DC | PRN
Start: 2019-12-31 — End: 2019-12-31
  Administered 2019-12-31: 25 ug/min via INTRAVENOUS

## 2019-12-31 MED ORDER — TRANEXAMIC ACID 1000 MG/10ML IV SOLN
2000.0000 mg | INTRAVENOUS | Status: DC
  Filled 2019-12-31: qty 20

## 2019-12-31 MED ORDER — HYDROMORPHONE HCL 1 MG/ML IJ SOLN
0.5000 mg | INTRAMUSCULAR | Status: DC | PRN
  Administered 2019-12-31 (×2): 0.5 mg via INTRAVENOUS
  Filled 2019-12-31: qty 1
  Filled 2019-12-31 (×2): qty 0.5

## 2019-12-31 MED ORDER — OXYCODONE-ACETAMINOPHEN 5-325 MG PO TABS: 1.0000 | ORAL_TABLET | Freq: Three times a day (TID) | ORAL | 0 refills | Status: AC | PRN

## 2019-12-31 MED ORDER — VANCOMYCIN HCL 1 G IV SOLR
INTRAVENOUS | Status: DC | PRN
  Administered 2019-12-31: 1000 mg via TOPICAL

## 2019-12-31 MED ORDER — FENTANYL CITRATE (PF) 100 MCG/2ML IJ SOLN
50.0000 ug | Freq: Once | INTRAMUSCULAR | Status: AC
  Administered 2019-12-31: 50 ug via INTRAVENOUS
  Filled 2019-12-31: qty 2

## 2019-12-31 MED ORDER — HYDROCODONE-ACETAMINOPHEN 5-325 MG PO TABS: 1.0000 | ORAL_TABLET | ORAL | Status: DC | PRN

## 2019-12-31 MED ORDER — CEFAZOLIN SODIUM-DEXTROSE 2-4 GM/100ML-% IV SOLN
2.0000 g | INTRAVENOUS | Status: AC
  Administered 2019-12-31: 2 g via INTRAVENOUS
  Filled 2019-12-31: qty 100

## 2019-12-31 MED ORDER — METHOCARBAMOL 1000 MG/10ML IJ SOLN
500.0000 mg | Freq: Four times a day (QID) | INTRAVENOUS | Status: DC | PRN
  Filled 2019-12-31: qty 5

## 2019-12-31 MED ORDER — HYDROCODONE-ACETAMINOPHEN 7.5-325 MG PO TABS
1.0000 | ORAL_TABLET | ORAL | Status: DC | PRN
  Administered 2020-01-07: 1 via ORAL
  Filled 2019-12-31: qty 1

## 2019-12-31 MED ORDER — ENOXAPARIN SODIUM 40 MG/0.4ML ~~LOC~~ SOLN
40.0000 mg | SUBCUTANEOUS | Status: DC
  Administered 2020-01-01 – 2020-01-09 (×9): 40 mg via SUBCUTANEOUS
  Filled 2019-12-31 (×9): qty 0.4

## 2019-12-31 MED ORDER — PHENYLEPHRINE 40 MCG/ML (10ML) SYRINGE FOR IV PUSH (FOR BLOOD PRESSURE SUPPORT)
PREFILLED_SYRINGE | INTRAVENOUS | Status: DC | PRN
  Administered 2019-12-31 (×2): 80 ug via INTRAVENOUS

## 2019-12-31 MED ORDER — INSULIN ASPART 100 UNIT/ML ~~LOC~~ SOLN
0.0000 [IU] | Freq: Three times a day (TID) | SUBCUTANEOUS | Status: DC
  Administered 2020-01-01 – 2020-01-03 (×5): 1 [IU] via SUBCUTANEOUS
  Administered 2020-01-03: 2 [IU] via SUBCUTANEOUS
  Administered 2020-01-04 – 2020-01-05 (×3): 1 [IU] via SUBCUTANEOUS
  Administered 2020-01-05: 5 [IU] via SUBCUTANEOUS
  Administered 2020-01-05 – 2020-01-06 (×2): 7 [IU] via SUBCUTANEOUS
  Administered 2020-01-06: 1 [IU] via SUBCUTANEOUS
  Administered 2020-01-06: 7 [IU] via SUBCUTANEOUS
  Administered 2020-01-07: 2 [IU] via SUBCUTANEOUS
  Administered 2020-01-07: 3 [IU] via SUBCUTANEOUS
  Administered 2020-01-08 (×2): 2 [IU] via SUBCUTANEOUS
  Administered 2020-01-08: 1 [IU] via SUBCUTANEOUS
  Administered 2020-01-09 – 2020-01-10 (×3): 2 [IU] via SUBCUTANEOUS
  Administered 2020-01-10: 1 [IU] via SUBCUTANEOUS

## 2019-12-31 MED ORDER — MORPHINE SULFATE (PF) 2 MG/ML IV SOLN: 0.5000 mg | INTRAVENOUS | Status: DC | PRN

## 2019-12-31 MED ORDER — BUPIVACAINE IN DEXTROSE 0.75-8.25 % IT SOLN
INTRATHECAL | Status: DC | PRN
Start: 2019-12-31 — End: 2019-12-31
  Administered 2019-12-31: 1.8 mg via INTRATHECAL

## 2019-12-31 SURGICAL SUPPLY — 99 items
ADH SKN CLS APL DERMABOND .7 (GAUZE/BANDAGES/DRESSINGS) ×2
BAG DECANTER FOR FLEXI CONT (MISCELLANEOUS) ×4 IMPLANT
BLADE CLIPPER SURG (BLADE) IMPLANT
BLADE SURG 10 STRL SS (BLADE) ×4 IMPLANT
BLADE SURG 15 STRL LF DISP TIS (BLADE) ×2 IMPLANT
BLADE SURG 15 STRL SS (BLADE) ×4
BNDG CMPR 9X4 STRL LF SNTH (GAUZE/BANDAGES/DRESSINGS) ×2
BNDG ELASTIC 3X5.8 VLCR STR LF (GAUZE/BANDAGES/DRESSINGS) ×4 IMPLANT
BNDG ELASTIC 4X5.8 VLCR STR LF (GAUZE/BANDAGES/DRESSINGS) ×4 IMPLANT
BNDG ESMARK 4X9 LF (GAUZE/BANDAGES/DRESSINGS) ×4 IMPLANT
CELLS DAT CNTRL 66122 CELL SVR (MISCELLANEOUS) IMPLANT
CORD BIPOLAR FORCEPS 12FT (ELECTRODE) ×4 IMPLANT
COVER PERINEAL POST (MISCELLANEOUS) ×4 IMPLANT
COVER SURGICAL LIGHT HANDLE (MISCELLANEOUS) ×4 IMPLANT
COVER WAND RF STERILE (DRAPES) ×4 IMPLANT
CUFF TOURN SGL QUICK 18X4 (TOURNIQUET CUFF) ×4 IMPLANT
CUFF TOURN SGL QUICK 24 (TOURNIQUET CUFF)
CUFF TRNQT CYL 24X4X16.5-23 (TOURNIQUET CUFF) IMPLANT
DERMABOND ADVANCED (GAUZE/BANDAGES/DRESSINGS) ×2
DERMABOND ADVANCED .7 DNX12 (GAUZE/BANDAGES/DRESSINGS) IMPLANT
DRAPE C-ARM 42X72 X-RAY (DRAPES) ×4 IMPLANT
DRAPE POUCH INSTRU U-SHP 10X18 (DRAPES) ×4 IMPLANT
DRAPE STERI IOBAN 125X83 (DRAPES) ×4 IMPLANT
DRAPE U-SHAPE 47X51 STRL (DRAPES) ×8 IMPLANT
DRSG AQUACEL AG ADV 3.5X 6 (GAUZE/BANDAGES/DRESSINGS) ×2 IMPLANT
DRSG AQUACEL AG ADV 3.5X10 (GAUZE/BANDAGES/DRESSINGS) ×4 IMPLANT
DURAPREP 26ML APPLICATOR (WOUND CARE) ×8 IMPLANT
ELECT BLADE 4.0 EZ CLEAN MEGAD (MISCELLANEOUS) ×4
ELECT CAUTERY BLADE 6.4 (BLADE) ×4 IMPLANT
ELECT REM PT RETURN 9FT ADLT (ELECTROSURGICAL) ×4
ELECTRODE BLDE 4.0 EZ CLN MEGD (MISCELLANEOUS) ×2 IMPLANT
ELECTRODE REM PT RTRN 9FT ADLT (ELECTROSURGICAL) ×2 IMPLANT
GAUZE SPONGE 4X4 12PLY STRL (GAUZE/BANDAGES/DRESSINGS) ×4 IMPLANT
GAUZE XEROFORM 1X8 LF (GAUZE/BANDAGES/DRESSINGS) ×4 IMPLANT
GLOVE BIOGEL PI IND STRL 7.0 (GLOVE) ×2 IMPLANT
GLOVE BIOGEL PI INDICATOR 7.0 (GLOVE) ×2
GLOVE ECLIPSE 7.0 STRL STRAW (GLOVE) ×8 IMPLANT
GLOVE SKINSENSE NS SZ7.5 (GLOVE) ×2
GLOVE SKINSENSE STRL SZ7.5 (GLOVE) ×2 IMPLANT
GLOVE SURG SYN 7.5  E (GLOVE) ×16
GLOVE SURG SYN 7.5 E (GLOVE) ×8 IMPLANT
GLOVE SURG SYN 7.5 PF PI (GLOVE) ×8 IMPLANT
GOWN STRL REIN XL XLG (GOWN DISPOSABLE) ×4 IMPLANT
GOWN STRL REUS W/ TWL LRG LVL3 (GOWN DISPOSABLE) IMPLANT
GOWN STRL REUS W/ TWL XL LVL3 (GOWN DISPOSABLE) ×2 IMPLANT
GOWN STRL REUS W/TWL LRG LVL3 (GOWN DISPOSABLE)
GOWN STRL REUS W/TWL XL LVL3 (GOWN DISPOSABLE) ×4
HANDPIECE INTERPULSE COAX TIP (DISPOSABLE) ×4
HEAD M SROM 36MM PLUS 1.5 (Hips) IMPLANT
HOOD PEEL AWAY FLYTE STAYCOOL (MISCELLANEOUS) ×8 IMPLANT
IV NS IRRIG 3000ML ARTHROMATIC (IV SOLUTION) ×4 IMPLANT
JET LAVAGE IRRISEPT WOUND (IRRIGATION / IRRIGATOR) ×4
KIT BASIN OR (CUSTOM PROCEDURE TRAY) ×4 IMPLANT
KIT TURNOVER KIT B (KITS) ×4 IMPLANT
LAVAGE JET IRRISEPT WOUND (IRRIGATION / IRRIGATOR) ×2 IMPLANT
LINER NEUTRAL 52X36MM PLUS 4 (Liner) ×2 IMPLANT
MARKER SKIN DUAL TIP RULER LAB (MISCELLANEOUS) ×4 IMPLANT
NDL SPNL 18GX3.5 QUINCKE PK (NEEDLE) ×2 IMPLANT
NEEDLE 22X1 1/2 (OR ONLY) (NEEDLE) IMPLANT
NEEDLE SPNL 18GX3.5 QUINCKE PK (NEEDLE) ×4 IMPLANT
NS IRRIG 1000ML POUR BTL (IV SOLUTION) ×4 IMPLANT
PACK ORTHO EXTREMITY (CUSTOM PROCEDURE TRAY) ×4 IMPLANT
PACK TOTAL JOINT (CUSTOM PROCEDURE TRAY) ×4 IMPLANT
PACK UNIVERSAL I (CUSTOM PROCEDURE TRAY) ×4 IMPLANT
PAD ARMBOARD 7.5X6 YLW CONV (MISCELLANEOUS) ×8 IMPLANT
PAD CAST 4YDX4 CTTN HI CHSV (CAST SUPPLIES) ×2 IMPLANT
PADDING CAST COTTON 4X4 STRL (CAST SUPPLIES) ×4
PIN SECTOR W/GRIP ACE CUP 52MM (Hips) ×4 IMPLANT
RTRCTR WOUND ALEXIS 18CM MED (MISCELLANEOUS)
SAW OSC TIP CART 19.5X105X1.3 (SAW) ×4 IMPLANT
SCREW 6.5MMX25MM (Screw) ×3 IMPLANT
SET HNDPC FAN SPRY TIP SCT (DISPOSABLE) ×2 IMPLANT
SLING ARM IMMOBILIZER MED (SOFTGOODS) ×2 IMPLANT
SPONGE LAP 4X18 RFD (DISPOSABLE) IMPLANT
SROM M HEAD 36MM PLUS 1.5 (Hips) ×4 IMPLANT
STAPLER VISISTAT 35W (STAPLE) IMPLANT
STEM FEMORAL SZ 6MM STD ACTIS (Stem) ×4 IMPLANT
SUT ETHIBOND 2 V 37 (SUTURE) ×4 IMPLANT
SUT ETHILON 4 0 PS 2 18 (SUTURE) ×4 IMPLANT
SUT MNCRL AB 4-0 PS2 18 (SUTURE) IMPLANT
SUT PROLENE 3 0 PS 2 (SUTURE) IMPLANT
SUT VIC AB 0 CT1 27 (SUTURE) ×4
SUT VIC AB 0 CT1 27XBRD ANBCTR (SUTURE) ×2 IMPLANT
SUT VIC AB 1 CTX 36 (SUTURE) ×4
SUT VIC AB 1 CTX36XBRD ANBCTR (SUTURE) ×2 IMPLANT
SUT VIC AB 2-0 CT1 27 (SUTURE) ×8
SUT VIC AB 2-0 CT1 TAPERPNT 27 (SUTURE) ×4 IMPLANT
SUT VIC AB 3-0 FS2 27 (SUTURE) IMPLANT
SYR 50ML LL SCALE MARK (SYRINGE) ×4 IMPLANT
SYR CONTROL 10ML LL (SYRINGE) IMPLANT
TOWEL GREEN STERILE (TOWEL DISPOSABLE) ×4 IMPLANT
TOWEL GREEN STERILE FF (TOWEL DISPOSABLE) ×4 IMPLANT
TRAY CATH 16FR W/PLASTIC CATH (SET/KITS/TRAYS/PACK) IMPLANT
TRAY FOLEY W/BAG SLVR 16FR (SET/KITS/TRAYS/PACK) ×4
TRAY FOLEY W/BAG SLVR 16FR ST (SET/KITS/TRAYS/PACK) ×2 IMPLANT
TUBE CONNECTING 12'X1/4 (SUCTIONS) ×1
TUBE CONNECTING 12X1/4 (SUCTIONS) ×3 IMPLANT
UNDERPAD 30X36 HEAVY ABSORB (UNDERPADS AND DIAPERS) ×4 IMPLANT
YANKAUER SUCT BULB TIP NO VENT (SUCTIONS) ×4 IMPLANT

## 2019-12-31 NOTE — Consult Note (Signed)
Reason for Consult:Left hip fracture Referring Physician: EDP  [name] is an 79 y.o. female.  HPI: 79 yo female who reports a ground level fall today at home.  She says she got up to go to the bathroom and started falling sideways. She complains of immediate hip pain on the left. She had her right hip replaced in May. She is also dealing with Stage III lung cancer (squamous). She hit her left wrist and complains of swelling and pain in the left wrist as well. She denies neck or low back pain  Past Medical History:  Diagnosis Date  . Bronchiectasis    PFTs August 23, 2010 VC 82%   dlco 62 > 129 CORRECTED  . Chronic diarrhea   . Chronic rhinitis    sinus CT ordered August 23, 2010  . Diabetes mellitus ([place])   . Hyperplastic colon polyp   . IBS (irritable bowel syndrome)   . ILD (interstitial lung disease) ([place])   . Stenosis of rectum and anus   . Thyroid disease     Past Surgical History:  Procedure Laterality Date  .  gallbaldder    . BRONCHIAL BIOPSY  11/13/2019   Procedure: BRONCHIAL BIOPSIES;  Surgeon: Collene Gobble, MD;  Location: [facility];  Service: Cardiopulmonary;;  . BRONCHIAL BRUSHINGS  11/13/2019   Procedure: BRONCHIAL BRUSHINGS;  Surgeon: Collene Gobble, MD;  Location: [facility];  Service: Cardiopulmonary;;  . CHOLECYSTECTOMY    . DILATION AND CURETTAGE OF UTERUS    . HAND SURGERY  1991   left hand; Baker's Cyst removed  . migraine    . tear duct surgery    . TOTAL HIP ARTHROPLASTY Right 11/04/2019   Procedure: TOTAL HIP ARTHROPLASTY ANTERIOR APPROACH;  Surgeon: Paralee Cancel, MD;  Location: [facility];  Service: Orthopedics;  Laterality: Right;  [name] VIDEO BRONCHOSCOPY N/A 11/13/2019   Procedure: VIDEO BRONCHOSCOPY;  Surgeon: Collene Gobble, MD;  Location: [facility];  Service: Cardiopulmonary;  Laterality: N/A;    Family History  Problem Relation Age of Onset  . Colon polyps Sister   . Heart disease Sister   . Diabetes Sister   . Heart disease Mother   .  Colon cancer Neg Hx     Social History:  reports that she quit smoking about 43 years ago. Her smoking use included cigarettes. She has a 45.00 pack-year smoking history. She has never used smokeless tobacco. She reports that she does not drink alcohol and does not use drugs.  Allergies:  Allergies  Allergen Reactions  . Penicillins Diarrhea    Has patient had a PCN reaction causing immediate rash, facial/tongue/throat swelling, SOB or lightheadedness with hypotension: Unknown Has patient had a PCN reaction causing severe rash involving mucus membranes or skin necrosis: Unknown Has patient had a PCN reaction that required hospitalization: No  Has patient had a PCN reaction occurring within the last 10 years: No  If all of the above answers are "NO", then may proceed with Cephalosporin use.   . Influenza Vaccines Other (See Comments)    Severe nausea, vomiting and body aches-md told her not to take it anymore  . Clarithromycin     Unknown reaction   . Cortisone Other (See Comments)    "Made her feel like ice water was running through her veins"   . Povidone-Iodine     Unknown reaction     Medications: I have reviewed the patient's current medications.  Results for orders placed or performed during the  hospital encounter of 12/31/19 (from the past 48 hour(s))  CBC with Differential/Platelet     Status: Abnormal   Collection Time: 12/31/19  3:30 AM  Result Value Ref Range   WBC 8.4 4.0 - 10.5 K/uL   RBC 2.94 (L) 3.87 - 5.11 MIL/uL   Hemoglobin 8.1 (L) 12.0 - 15.0 g/dL   HCT 27.0 (L) 36 - 46 %   MCV 91.8 80.0 - 100.0 fL   MCH 27.6 26.0 - 34.0 pg   MCHC 30.0 30.0 - 36.0 g/dL   RDW 15.4 11.5 - 15.5 %   Platelets 300 150 - 400 K/uL   nRBC 0.0 0.0 - 0.2 %   Neutrophils Relative % 79 %   Neutro Abs 6.6 1.7 - 7.7 K/uL   Lymphocytes Relative 14 %   Lymphs Abs 1.1 0.7 - 4.0 K/uL   Monocytes Relative 5 %   Monocytes Absolute 0.5 0 - 1 K/uL   Eosinophils Relative 1 %   Eosinophils  Absolute 0.1 0 - 0 K/uL   Basophils Relative 0 %   Basophils Absolute 0.0 0 - 0 K/uL   Immature Granulocytes 1 %   Abs Immature Granulocytes 0.05 0.00 - 0.07 K/uL    Comment: Performed at [facility], [place] [address], [place], [place] [address]  Protime-INR     Status: None   Collection Time: 12/31/19  3:30 AM  Result Value Ref Range   Prothrombin Time 14.5 11.4 - 15.2 seconds   INR 1.2 0.8 - 1.2    Comment: (NOTE) INR goal varies based on device and disease states. Performed at [facility], [place] [address], [place], [place] [address]   Comprehensive metabolic panel     Status: Abnormal   Collection Time: 12/31/19  3:30 AM  Result Value Ref Range   Sodium 138 135 - 145 mmol/L   Potassium 3.7 3.5 - 5.1 mmol/L   Chloride 104 98 - 111 mmol/L   CO2 26 22 - 32 mmol/L   Glucose, Bld 130 (H) 70 - 99 mg/dL    Comment: Glucose reference range applies only to samples taken after fasting for at least 8 hours.   BUN 9 8 - 23 mg/dL   Creatinine, Ser 0.50 0.44 - 1.00 mg/dL   Calcium 9.4 8.9 - 10.3 mg/dL   Total Protein 6.7 6.5 - 8.1 g/dL   Albumin 2.4 (L) 3.5 - 5.0 g/dL   AST 30 15 - 41 U/L   ALT 9 0 - 44 U/L   Alkaline Phosphatase 58 38 - 126 U/L   Total Bilirubin 0.8 0.3 - 1.2 mg/dL   GFR calc non Af Amer >60 >60 mL/min   GFR calc Af Amer >60 >60 mL/min   Anion gap 8 5 - 15    Comment: Performed at [facility], [place] [address] [place], [place] [address]  Type and screen     Status: None (Preliminary result)   Collection Time: 12/31/19  4:43 AM  Result Value Ref Range   ABO/RH(D) PENDING    Antibody Screen PENDING    Sample Expiration      01/03/2020,2359 Performed at [facility], [place] [address], [place],  [address]     XRAYs of the left hip and pelvis show a displaced left femoral neck fracture  Review of Systems Blood pressure 122/62, pulse 84, resp. rate (!) 25, height 5' 5.5" (1.664 m),  weight 60.3 kg, SpO2 100 %. Physical Exam Awake  and alert. No neck tenderness, C collar in place No tenderness over the thoracic or low back Bilateral shoulders not swollen and not tender, no pain with AROM of the shoulders, elbows and the right wrist.  The left wrist is swollen but the skin is intact. Decreased ROM of the left wrist due to the injury. Sensation intact x 4 extremities Right LE with pain PROM of the hip knee and ankle Left LE shortened and externally rotated.  Knee is not tender and not swollen. No pain with AROM of the ankle   Assessment/Plan: Left displaced femoral neck fracture. Will require admission and medical optimization prior to hip replacement on the left. Will be reaching out to the other ortho groups as our hip replacement surgeons are out of town Plan to follow  Augustin Schooling 12/31/2019, 5:00 AM

## 2019-12-31 NOTE — ED Notes (Signed)
Attempted to call report x 1  

## 2019-12-31 NOTE — H&P (Signed)
History and Physical        Hospital Admission Note Date: 12/31/2019  Patient name: [name] Medical record number: [id number] Date of birth: [birth year] Age: 79 y.o. Gender: female  PCP: [name], MD  Patient coming from: Home Lives with: Son  Chief Complaint    Chief Complaint  Patient presents with  . Fall      HPI:   This is a 79 year old female with past medical history of chronic hypoxic respiratory failure with ILD on 2 L at baseline follows with Dr. [name], diabetes, brain aneurysm, recent right hip replacement in May, stage III squamous cell lung cancer (follows with Dr. [name]) who presented to [facility] ED on 7/22 after a fall from ground-level after getting up from sitting position to go to the bathroom and fell sideways without LOC with subsequent left hip and wrist pain.  ED Course: Pan scan most notable for left femoral neck angulated comminuted fracture and acute styloid ulnar fracture with suspected chronic left radius fracture.  Wrist was splinted in the ED.  Orthopedic surgery was consulted as well as Dr. Claudia Desanctis, plastic surgery who recommended CT scan of left wrist to clarify fracture details and acuity.  Most normal lab Hb 8.1 slightly below baseline of 8.7.  She was given Dilaudid 0.5 mg and fentanyl for pain  Vitals:   12/31/19 0843 12/31/19 0959  BP: 112/65 135/64  Pulse: 67 83  Resp: 18 14  Temp: 98 F (36.7 C) 97.9 F (36.6 C)  SpO2: 100% 100%     Review of Systems:  Review of Systems  Constitutional: Negative for chills and fever.  Respiratory: Positive for shortness of breath. Negative for wheezing.   Cardiovascular: Negative for chest pain.  Gastrointestinal: Negative for nausea and vomiting.  Musculoskeletal: Positive for falls and joint pain.       Left hip and left wrist pain  Neurological: Negative.   Endo/Heme/Allergies:  Negative.   All other systems reviewed and are negative.   Medical/Social/Family History   Past Medical History: Past Medical History:  Diagnosis Date  . Bronchiectasis    PFTs August 23, 2010 VC 82%   dlco 62 > 129 CORRECTED  . Chronic diarrhea   . Chronic rhinitis    sinus CT ordered August 23, 2010  . Diabetes mellitus ([place])   . Hyperplastic colon polyp   . IBS (irritable bowel syndrome)   . ILD (interstitial lung disease) ([place])   . Stenosis of rectum and anus   . Thyroid disease     Past Surgical History:  Procedure Laterality Date  .  gallbaldder    . BRONCHIAL BIOPSY  11/13/2019   Procedure: BRONCHIAL BIOPSIES;  Surgeon: Collene Gobble, MD;  Location: [facility];  Service: Cardiopulmonary;;  . BRONCHIAL BRUSHINGS  11/13/2019   Procedure: BRONCHIAL BRUSHINGS;  Surgeon: Collene Gobble, MD;  Location: [facility];  Service: Cardiopulmonary;;  . CHOLECYSTECTOMY    . DILATION AND CURETTAGE OF UTERUS    . HAND SURGERY  1991   left hand; Baker's Cyst removed  . migraine    . tear duct surgery    . TOTAL HIP ARTHROPLASTY Right 11/04/2019   Procedure: TOTAL HIP ARTHROPLASTY ANTERIOR APPROACH;  Surgeon: Paralee Cancel, MD;  Location: [facility];  Service: Orthopedics;  Laterality: Right;  [name] VIDEO BRONCHOSCOPY N/A 11/13/2019   Procedure: VIDEO BRONCHOSCOPY;  Surgeon: Collene Gobble, MD;  Location: [facility];  Service: Cardiopulmonary;  Laterality: N/A;    Medications: Prior to Admission medications   Medication Sig Start Date End Date Taking? Authorizing Provider  Ascorbic Acid (VITAMIN C) 1000 MG tablet Take 1,000 mg by mouth at bedtime.    Yes [provider]  benzonatate (TESSALON) 100 MG capsule TAKE 1 CAPSULE (100 MG TOTAL) BY MOUTH EVERY 8 (EIGHT) HOURS AS NEEDED FOR COUGH. Patient taking differently: Take 100 mg by mouth in the morning.  11/18/18  Yes Croitoru, Mihai, MD  Biotin 10000 MCG TABS Take 10,000 mcg by mouth daily.    Yes [provider]    Calcium-Magnesium-Vitamin D (CALCIUM 1200+D3 PO) Take 1 tablet by mouth at bedtime.   Yes [provider]  clorazepate (TRANXENE) 3.75 MG tablet TAKE 1 TABLET BY MOUTH DAILY.MAY TAKE UP TO 2 DAILY IF NEEDED Patient taking differently: Take 3.75 mg by mouth daily. MAY TAKE UP TO 2 DAILY IF NEEDED 11/14/19  Yes Georgette Shell, MD  cyclobenzaprine (FLEXERIL) 10 MG tablet Take 10 mg by mouth at bedtime.    Yes [provider]  docusate sodium (COLACE) 100 MG capsule Take 1 capsule (100 mg total) by mouth 2 (two) times daily. Patient taking differently: Take 100 mg by mouth daily.  11/05/19  Yes Maurice March, PA-C  ferrous sulfate 325 (65 FE) MG tablet Take 1 tablet (325 mg total) by mouth 3 (three) times daily after meals for 14 days. Patient taking differently: Take 325 mg by mouth daily with breakfast.  11/05/19 12/31/19 Yes Stinson, Nicola Girt, PA-C  levothyroxine (SYNTHROID, LEVOTHROID) 88 MCG tablet Take 88 mcg by mouth daily before breakfast.    Yes [provider]  Multiple Vitamin (MULTIVITAMIN) tablet Take 1 tablet by mouth daily.     Yes [provider]  pioglitazone (ACTOS) 15 MG tablet Take 15 mg by mouth daily.   Yes [provider]  pravastatin (PRAVACHOL) 40 MG tablet TAKE 1 TABLET BY MOUTH EVERY DAY IN THE EVENING Patient taking differently: Take 40 mg by mouth daily.  11/10/19  Yes Croitoru, Mihai, MD  prochlorperazine (COMPAZINE) 10 MG tablet Take 1 tablet (10 mg total) by mouth every 6 (six) hours as needed for nausea or vomiting. 12/24/19  Yes Curt Bears, MD  TRINTELLIX 10 MG TABS tablet Take 10 mg by mouth daily. 12/23/19  Yes [provider]  calcium carbonate (TUMS - DOSED IN MG ELEMENTAL CALCIUM) 500 MG chewable tablet Chew 1 tablet (200 mg of elemental calcium total) by mouth 3 (three) times daily with meals. Patient not taking: Reported on 12/31/2019 11/14/19   Georgette Shell, MD  celecoxib (CELEBREX) 200 MG  capsule Take 1 capsule (200 mg total) by mouth 2 (two) times daily. Patient not taking: Reported on 12/31/2019 11/05/19   Maurice March, PA-C  HYDROcodone-acetaminophen (NORCO/VICODIN) 5-325 MG tablet Take 1-2 tablets by mouth every 6 (six) hours as needed for moderate pain or severe pain (pain score 4-6). Patient not taking: Reported on 12/31/2019 11/14/19   Georgette Shell, MD  menthol-cetylpyridinium (CEPACOL) 3 MG lozenge Take 1 lozenge (3 mg total) by mouth as needed for sore throat (sore throat). Patient not taking: Reported on 12/31/2019 11/14/19   Georgette Shell, MD  phenol (CHLORASEPTIC) 1.4 % LIQD Use as  directed 1 spray in the mouth or throat as needed for throat irritation / pain. Patient not taking: Reported on 12/31/2019 11/14/19   Georgette Shell, MD  polyvinyl alcohol (LIQUIFILM TEARS) 1.4 % ophthalmic solution Place 1 drop into the left eye 3 (three) times daily as needed for dry eyes. Patient not taking: Reported on 12/31/2019 11/14/19   Georgette Shell, MD  rivaroxaban (XARELTO) 10 MG TABS tablet Take 1 tablet (10 mg total) by mouth daily for 21 days. Patient not taking: Reported on 12/31/2019 11/06/19 11/27/19  Maurice March, PA-C  verapamil (CALAN) 120 MG tablet Take 0.5 tablets (60 mg total) by mouth 2 (two) times daily. Patient not taking: Reported on 12/31/2019 11/14/19   Georgette Shell, MD    Allergies:   Allergies  Allergen Reactions  . Penicillins Diarrhea    Has patient had a PCN reaction causing immediate rash, facial/tongue/throat swelling, SOB or lightheadedness with hypotension: Unknown Has patient had a PCN reaction causing severe rash involving mucus membranes or skin necrosis: Unknown Has patient had a PCN reaction that required hospitalization: No  Has patient had a PCN reaction occurring within the last 10 years: No  If all of the above answers are "NO", then may proceed with Cephalosporin use.   . Influenza Vaccines Other (See Comments)      Severe nausea, vomiting and body aches-md told her not to take it anymore  . Clarithromycin     Unknown reaction   . Cortisone Other (See Comments)    "Made her feel like ice water was running through her veins"   . Povidone-Iodine     Unknown reaction     Social History:  reports that she quit smoking about 43 years ago. Her smoking use included cigarettes. She has a 45.00 pack-year smoking history. She has never used smokeless tobacco. She reports that she does not drink alcohol and does not use drugs.  Family History: Family History  Problem Relation Age of Onset  . Colon polyps Sister   . Heart disease Sister   . Diabetes Sister   . Heart disease Mother   . Colon cancer Neg Hx      Objective   Physical Exam: Blood pressure 135/64, pulse 83, temperature 97.9 F (36.6 C), temperature source Oral, resp. rate 14, height 5' 5.5" (1.664 m), weight 60.3 kg, SpO2 100 %.  Physical Exam Vitals and nursing note reviewed.  Constitutional:      Appearance: Normal appearance.  HENT:     Head: Normocephalic and atraumatic.  Eyes:     Conjunctiva/sclera: Conjunctivae normal.  Cardiovascular:     Rate and Rhythm: Normal rate and regular rhythm.  Pulmonary:     Effort: Pulmonary effort is normal.     Breath sounds: Normal breath sounds.     Comments: On 4 L/min nasal cannula Abdominal:     General: Abdomen is flat.     Palpations: Abdomen is soft.  Musculoskeletal:     Comments: Left hand with Ace wrap Left leg rotated out  Skin:    Coloration: Skin is not jaundiced or pale.  Neurological:     Mental Status: She is alert. Mental status is at baseline.  Psychiatric:        Mood and Affect: Mood normal.        Behavior: Behavior normal.     LABS on Admission: I have personally reviewed all the labs and imaging below    Basic Metabolic Panel: Recent Labs  Lab 12/31/19 0330  NA 138  K 3.7  CL 104  CO2 26  GLUCOSE 130*  BUN 9  CREATININE 0.50  CALCIUM 9.4    Liver Function Tests: Recent Labs  Lab 12/31/19 0330  AST 30  ALT 9  ALKPHOS 58  BILITOT 0.8  PROT 6.7  ALBUMIN 2.4*   No results for input(s): LIPASE, AMYLASE in the last 168 hours. No results for input(s): AMMONIA in the last 168 hours. CBC: Recent Labs  Lab 12/31/19 0330  WBC 8.4  NEUTROABS 6.6  HGB 8.1*  HCT 27.0*  MCV 91.8  PLT 300   Cardiac Enzymes: No results for input(s): CKTOTAL, CKMB, CKMBINDEX, TROPONINI in the last 168 hours. BNP: Invalid input(s): POCBNP CBG: No results for input(s): GLUCAP in the last 168 hours.  Radiological Exams on Admission:  DG Wrist 2 Views Left  Result Date: 12/31/2019 CLINICAL DATA:  Fall with injury to left wrist. EXAM: LEFT WRIST - 2 VIEW COMPARISON:  Prior report 07/07/1999. FINDINGS: Angulated fracture of the distal left radius. This appears to be old and was previously described on prior study of 07/07/1999. A follow-up exam in 7-10 days may prove useful to ensure stable appearance. Displaced fracture of the ulnar styloid. This appears acute. Diffuse degenerative change. Diffuse soft tissue swelling. No radiopaque foreign body. IMPRESSION: 1. Angulated fracture of the distal left radius. This appears to be old was previously described on prior study of 07/07/1999. A follow-up exam in 7-10 days may prove useful to ensure stable appearance. 2.  Displaced fracture of the ulnar styloid.  This appears acute. 3.  Diffuse degenerative change.  Diffuse soft tissue swelling. Electronically Signed   By: Marcello Moores  Register   On: 12/31/2019 05:33   CT Head Wo Contrast  Result Date: 12/31/2019 CLINICAL DATA:  Facial trauma.  Fell on [employer] EXAM: CT HEAD WITHOUT CONTRAST CT CERVICAL SPINE WITHOUT CONTRAST TECHNIQUE: Multidetector CT imaging of the head and cervical spine was performed following the standard protocol without intravenous contrast. Multiplanar CT image reconstructions of the cervical spine were also generated. COMPARISON:  Head MRI  12/08/2019.  Neck CTA 06/11/2010. FINDINGS: CT HEAD FINDINGS Brain: There is no evidence of acute infarct, intracranial hemorrhage, mass, midline shift, or extra-axial fluid collection. There is mild cerebral atrophy with unchanged enlargement of the extra-axial CSF spaces over the frontal convexities. Hypodensities in the cerebral white matter bilaterally are nonspecific but compatible with mild chronic small vessel ischemic disease. Vascular: Calcified atherosclerosis at the skull base. 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 Skull: No fracture suspicious osseous lesion. Sinuses/Orbits: Chronic left maxillary sinusitis with essentially complete opacification of the sinus. Clear mastoid air cells. Unremarkable orbits. Other: None. CT CERVICAL SPINE FINDINGS Alignment: Mild reversal of the normal cervical lordosis. Trace anterolisthesis of C3 on C4. Skull base and vertebrae: No acute fracture or suspicious osseous lesion. Soft tissues and spinal canal: No prevertebral fluid or swelling. No visible canal hematoma. Disc levels: Moderate disc space narrowing at C5-6 with asymmetric left uncovertebral spurring resulting in mild left neural foraminal stenosis. Severe facet arthrosis on the right at C2-3 and on the left at C3-4 and C4-5. Upper chest: Biapical pleuroparenchymal lung scarring, centrilobular emphysema, and biapical nodular densities similar to a 12/08/2019 PET-CT. Other: Mild calcified atherosclerosis at the left carotid bifurcation. IMPRESSION: 1. No evidence of acute intracranial abnormality. 2. Mild chronic small vessel ischemic disease. 3. Known 1 cm right supraclinoid ICA aneurysm.  4. No acute cervical spine fracture. 5. Emphysema (ICD10-J43.9). Electronically Signed   By: Logan Bores M.D.   On: 12/31/2019 06:12   CT Cervical Spine Wo Contrast  Result Date: 12/31/2019 CLINICAL DATA:  Facial trauma.  Fell on [employer] EXAM: CT HEAD  WITHOUT CONTRAST CT CERVICAL SPINE WITHOUT CONTRAST TECHNIQUE: Multidetector CT imaging of the head and cervical spine was performed following the standard protocol without intravenous contrast. Multiplanar CT image reconstructions of the cervical spine were also generated. COMPARISON:  Head MRI 12/08/2019.  Neck CTA 06/11/2010. FINDINGS: CT HEAD FINDINGS Brain: There is no evidence of acute infarct, intracranial hemorrhage, mass, midline shift, or extra-axial fluid collection. There is mild cerebral atrophy with unchanged enlargement of the extra-axial CSF spaces over the frontal convexities. Hypodensities in the cerebral white matter bilaterally are nonspecific but compatible with mild chronic small vessel ischemic disease. Vascular: Calcified atherosclerosis at the skull base. 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 Skull: No fracture suspicious osseous lesion. Sinuses/Orbits: Chronic left maxillary sinusitis with essentially complete opacification of the sinus. Clear mastoid air cells. Unremarkable orbits. Other: None. CT CERVICAL SPINE FINDINGS Alignment: Mild reversal of the normal cervical lordosis. Trace anterolisthesis of C3 on C4. Skull base and vertebrae: No acute fracture or suspicious osseous lesion. Soft tissues and spinal canal: No prevertebral fluid or swelling. No visible canal hematoma. Disc levels: Moderate disc space narrowing at C5-6 with asymmetric left uncovertebral spurring resulting in mild left neural foraminal stenosis. Severe facet arthrosis on the right at C2-3 and on the left at C3-4 and C4-5. Upper chest: Biapical pleuroparenchymal lung scarring, centrilobular emphysema, and biapical nodular densities similar to a 12/08/2019 PET-CT. Other: Mild calcified atherosclerosis at the left carotid bifurcation. IMPRESSION: 1. No evidence of acute intracranial abnormality. 2. Mild chronic small vessel ischemic disease. 3. Known 1  cm right supraclinoid ICA aneurysm. 4. No acute cervical spine fracture. 5. Emphysema (ICD10-J43.9). Electronically Signed   By: Logan Bores M.D.   On: 12/31/2019 06:12   CT WRIST LEFT WO CONTRAST  Result Date: 12/31/2019 CLINICAL DATA:  Evaluate left wrist fracture EXAM: CT OF THE LEFT WRIST WITHOUT CONTRAST TECHNIQUE: Multidetector CT imaging was performed according to the standard protocol. Multiplanar CT image reconstructions were also generated. COMPARISON:  X-ray 12/31/2019 FINDINGS: Technical note: Examination is degraded by beam hardening artifact related to patient positioning of the wrist overlying the abdomen. Bones/Joint/Cartilage Acute impacted fracture of the distal radial metaphysis with intra-articular extension to the radiocarpal joint. Approximately 6 mm of impaction. Large dorsal fracture fragment of the distal radius is displaced dorsally up to 5 mm resulting in significant radiocarpal articular surface diastasis (series 4, image 21). Acute mildly displaced ulnar styloid fracture (series 5, image 22) Carpal bones appear intact without fracture or malalignment. Ligaments Suboptimally assessed by CT. Muscles and Tendons Suboptimally evaluated given the degree of beam hardening. Soft tissues Diffuse soft tissue swelling the level of the wrist. No well-defined fluid collection. IMPRESSION: 1. Acute impacted fracture of the distal radial metaphysis with intra-articular extension to the radiocarpal joint. 2. Dominant dorsal fracture fragment of the distal radius is displaced dorsally up to 5 mm with resultant radiocarpal articular surface diastasis. 3. Acute mildly displaced ulnar styloid fracture. Electronically Signed   By: Davina Poke D.O.   On: 12/31/2019 13:23   DG Chest Portable 1 View  Result Date: 12/31/2019 CLINICAL DATA:  Fall. EXAM: PORTABLE CHEST 1 VIEW COMPARISON:  CT chest 11/04/2019,  05/14/2018. Chest x-ray 11/03/2019, 05/13/2018. PET CT 06/05/2018. FINDINGS: Mediastinum 1.  Hilar structures are stable. Large mass left upper lung again noted again concerning for a neoplasm. Chronic interstitial lung disease again noted without interim change. Tiny left pleural effusion cannot be excluded. Biapical pleural thickening again noted consistent scarring. Heart size stable. Surgical clips right upper quadrant. Degenerative change thoracic spine. No acute bony abnormality identified. IMPRESSION: 1. Large mass left upper lung again noted. Although infectious etiology cannot be excluded. This finding is concerning for a neoplasm such as bronchogenic carcinoma. Similar findings noted on prior exam. Tiny left pleural effusion cannot be excluded. 2. Chronic interstitial lung disease again noted without interim change. 3. No acute bony abnormality identified. No evidence of pneumothorax. Electronically Signed   By: Marcello Moores  Register   On: 12/31/2019 05:26   DG HIP PORT UNILAT WITH PELVIS 1V LEFT  Result Date: 12/31/2019 CLINICAL DATA:  Fall with injury to left hip. EXAM: DG HIP (WITH OR WITHOUT PELVIS) 1V PORT LEFT COMPARISON:  PET-CT 12/08/2019. FINDINGS: Diffuse osteopenia. Degenerative changes lumbar spine and left hip. Total right hip replacement. Hardware intact. Anatomic alignment. Angulated comminuted fracture of the left femoral neck noted. No evidence of dislocation. IMPRESSION: Angulated comminuted fracture of the left femoral neck. Electronically Signed   By: Marcello Moores  Register   On: 12/31/2019 05:29      EKG: Independently reviewed.    A & P   Active Problems:   Fracture of femoral neck, left, closed (HCC)   Protein-calorie malnutrition, severe   1. Left hip fracture and left wrist fracture s/p mechanical fall from ground height a. CT left wrist ordered per plastic surgery: Acute impacted fracture of distal radial metaphysis with intra-articular extension to the radiocarpal joint with dominant dorsal fracture fragment of distal radius displaced dorsally and acute mildly  displaced ulnar styloid fracture. b. Orthopedic and plastic surgery on board, plan for surgery today at [facility] c. Pain control per surgical services d. PT eval when able  2. Acute on chronic hypoxic respiratory failure in setting of ILD and known stage III squamous cell lung cancer a. Suspect atelectasis b. On 2 L/min at baseline, currently on 4 L at bedside c. CXR with known LUL mass and chronic ILD noted d. Incentive spirometry e. Hold antibiotics for now as she is afebrile and without leukocytosis  3. Normocytic anemia, stable a. Continue home iron  4. Stage III lung cancer a. Follows with Dr. Julien Nordmann b. Oncology notified  5. Diabetes a. HA1C b. Sensitive sliding scale, monitor for hypoglycemia while n.p.o.   DVT prophylaxis: Per Ortho   Code Status: Full Code  Diet: N.p.o. Family Communication: Admission, patients condition and plan of care including tests being ordered have been discussed with the patient who indicates understanding and agrees with the plan and Code Status. Patient's son at bedside was updated  Disposition Plan: The appropriate patient status for this patient is INPATIENT. Inpatient status is judged to be reasonable and necessary in order to provide the required intensity of service to ensure the patient's safety. The patient's presenting symptoms, physical exam findings, and initial radiographic and laboratory data in the context of their chronic comorbidities is felt to place them at high risk for further clinical deterioration. Furthermore, it is not anticipated that the patient will be medically stable for discharge from the hospital within 2 midnights of admission. The following factors support the patient status of inpatient.   " The patient's presenting symptoms include left wrist and hip  pain. " The worrisome physical exam findings include pain and increased O2 requirement. " The initial radiographic and laboratory data are worrisome because of left wrist and  hip fracture. " The chronic co-morbidities include ILD with chronic respiratory failure.   * I certify that at the point of admission it is my clinical judgment that the patient will require inpatient hospital care spanning beyond 2 midnights from the point of admission due to high intensity of service, high risk for further deterioration and high frequency of surveillance required.*   Status is: Inpatient  Remains inpatient appropriate because:Ongoing diagnostic testing needed not appropriate for outpatient work up and Inpatient level of care appropriate due to severity of illness   Dispo: The patient is from: Home              Anticipated d/c is to: SNF              Anticipated d/c date is: 3 days              Patient currently is not medically stable to d/c.    Consultants  . Orthopedic surgery . Plastic surgery  Procedures  . None  Time Spent on Admission: 67 minutes    Harold Hedge, DO [facility] Pager [phone] 12/31/2019, 2:05 PM

## 2019-12-31 NOTE — Progress Notes (Signed)
Orthopedic Tech Progress Note Patient Details:  [name] 29-Sep-1940 [id number]  Ortho Devices Type of Ortho Device: Volar splint Ortho Device/Splint Location: LUE Ortho Device/Splint Interventions: Application   Post Interventions Patient Tolerated: Well Instructions Provided: Care of device   [name] [name] 12/31/2019, 7:03 AM

## 2019-12-31 NOTE — ED Triage Notes (Signed)
As pre [facility] pt slipped and fell on back porch injuring left hip and left wrist. No LOC

## 2019-12-31 NOTE — ED Provider Notes (Addendum)
[place] DEPT Provider Note   CSN: [id number] Arrival date & time: 12/31/19  0247     History Chief Complaint  Patient presents with  . Fall    Samanvitha A Maslow is a 79 y.o. female.  The history is provided by the patient and a relative.  Fall This is a new problem. The current episode started 1 to 2 hours ago. The problem occurs constantly. The problem has not changed since onset.Pertinent negatives include no chest pain, no abdominal pain, no headaches and no shortness of breath. Nothing aggravates the symptoms. Nothing relieves the symptoms. She has tried nothing for the symptoms. The treatment provided no relief.  Patient s/p right hip repair 1 month ago presents after slip and fall on the porch with left wrist and left hip pain and deformity.       Past Medical History:  Diagnosis Date  . Bronchiectasis    PFTs August 23, 2010 VC 82%   dlco 62 > 129 CORRECTED  . Chronic diarrhea   . Chronic rhinitis    sinus CT ordered August 23, 2010  . Diabetes mellitus ([place])   . Hyperplastic colon polyp   . IBS (irritable bowel syndrome)   . ILD (interstitial lung disease) ([place])   . Stenosis of rectum and anus   . Thyroid disease     Patient Active Problem List   Diagnosis Date Noted  . Goals of care, counseling/discussion 12/24/2019  . Stage III squamous cell carcinoma of left lung ([place]) 12/01/2019  . Encounter for antineoplastic chemotherapy 12/01/2019  . Fall at home   . Unwitnessed fall 11/04/2019  . Mediastinal adenopathy 11/04/2019  . Hypothyroidism 11/04/2019  . Fracture of femoral neck, right ([place]) 11/03/2019  . Essential hypertension 07/20/2018  . Hypercholesterolemia 07/20/2018  . Type 2 diabetes mellitus with hyperlipidemia ([place]) 07/20/2018  . Coronary artery calcification seen on CAT scan 07/20/2018  . Mass of left lung 05/22/2018  . Hypokalemia 05/13/2018  . Hypoxemia 05/13/2018  . Diabetes mellitus without complication ([place])  56/21/3086  . Environmental allergies 02/07/2015  . IPF (idiopathic pulmonary fibrosis) ([facility]) 07/15/2014  . Chronic cough 02/11/2014  . ILD (interstitial lung disease) ([place]) 11/18/2013  . Chronic rhinitis 10/02/2010  . GERD (gastroesophageal reflux disease) 10/02/2010  . INTERSTITIAL LUNG DISEASE 07/10/2010  . DEPRESSION/ANXIETY 04/12/2008  . Irritable bowel syndrome 10/09/2007  . Mass of anus 10/09/2007  . DIARRHEA, CHRONIC 10/09/2007    Past Surgical History:  Procedure Laterality Date  .  gallbaldder    . BRONCHIAL BIOPSY  11/13/2019   Procedure: BRONCHIAL BIOPSIES;  Surgeon: Collene Gobble, MD;  Location: [facility];  Service: Cardiopulmonary;;  . BRONCHIAL BRUSHINGS  11/13/2019   Procedure: BRONCHIAL BRUSHINGS;  Surgeon: Collene Gobble, MD;  Location: [facility];  Service: Cardiopulmonary;;  . CHOLECYSTECTOMY    . DILATION AND CURETTAGE OF UTERUS    . HAND SURGERY  1991   left hand; Baker's Cyst removed  . migraine    . tear duct surgery    . TOTAL HIP ARTHROPLASTY Right 11/04/2019   Procedure: TOTAL HIP ARTHROPLASTY ANTERIOR APPROACH;  Surgeon: Paralee Cancel, MD;  Location: [facility];  Service: Orthopedics;  Laterality: Right;  [name] VIDEO BRONCHOSCOPY N/A 11/13/2019   Procedure: VIDEO BRONCHOSCOPY;  Surgeon: Collene Gobble, MD;  Location: [facility];  Service: Cardiopulmonary;  Laterality: N/A;     OB History   No obstetric history on file.     Family History  Problem Relation Age of  Onset  . Colon polyps Sister   . Heart disease Sister   . Diabetes Sister   . Heart disease Mother   . Colon cancer Neg Hx     Social History   Tobacco Use  . Smoking status: Former Smoker    Packs/day: 3.00    Years: 15.00    Pack years: 45.00    Types: Cigarettes    Quit date: 06/11/1976    Years since quitting: 43.5  . Smokeless tobacco: Never Used  Substance Use Topics  . Alcohol use: No  . Drug use: No    Home Medications Prior to Admission medications   Medication  Sig Start Date End Date Taking? Authorizing Provider  Ascorbic Acid (VITAMIN C) 1000 MG tablet Take 1,000 mg by mouth at bedtime.     [provider]  benzonatate (TESSALON) 100 MG capsule TAKE 1 CAPSULE (100 MG TOTAL) BY MOUTH EVERY 8 (EIGHT) HOURS AS NEEDED FOR COUGH. 11/18/18   Croitoru, Mihai, MD  Biotin 10000 MCG TABS Take 10,000 mcg by mouth daily.     [provider]  calcium carbonate (TUMS - DOSED IN MG ELEMENTAL CALCIUM) 500 MG chewable tablet Chew 1 tablet (200 mg of elemental calcium total) by mouth 3 (three) times daily with meals. 11/14/19   Georgette Shell, MD  Calcium-Magnesium-Vitamin D (CALCIUM 1200+D3 PO) Take 1 tablet by mouth at bedtime.    [provider]  celecoxib (CELEBREX) 200 MG capsule Take 1 capsule (200 mg total) by mouth 2 (two) times daily. 11/05/19   Maurice March, PA-C  clorazepate (TRANXENE) 3.75 MG tablet TAKE 1 TABLET BY MOUTH DAILY.MAY TAKE UP TO 2 DAILY IF NEEDED 11/14/19   Georgette Shell, MD  cyclobenzaprine (FLEXERIL) 10 MG tablet Take 10 mg by mouth at bedtime.     [provider]  docusate sodium (COLACE) 100 MG capsule Take 1 capsule (100 mg total) by mouth 2 (two) times daily. 11/05/19   Maurice March, PA-C  ferrous sulfate 325 (65 FE) MG tablet Take 1 tablet (325 mg total) by mouth 3 (three) times daily after meals for 14 days. 11/05/19 11/19/19  Maurice March, PA-C  ferrous sulfate 325 (65 FE) MG tablet Take by mouth. 11/05/19   [provider]  HYDROcodone-acetaminophen (NORCO/VICODIN) 5-325 MG tablet Take 1-2 tablets by mouth every 6 (six) hours as needed for moderate pain or severe pain (pain score 4-6). 11/14/19   Georgette Shell, MD  levothyroxine (SYNTHROID, LEVOTHROID) 88 MCG tablet Take 88 mcg by mouth daily before breakfast.     [provider]  menthol-cetylpyridinium (CEPACOL) 3 MG lozenge Take 1 lozenge (3 mg total) by mouth as needed for sore throat (sore throat). 11/14/19    Georgette Shell, MD  Multiple Vitamin (MULTIVITAMIN WITH MINERALS) TABS tablet Take 1 tablet by mouth daily.    [provider]  Multiple Vitamin (MULTIVITAMIN) tablet Take 1 tablet by mouth daily.      [provider]  phenol (CHLORASEPTIC) 1.4 % LIQD Use as directed 1 spray in the mouth or throat as needed for throat irritation / pain. 11/14/19   Georgette Shell, MD  pioglitazone (ACTOS) 15 MG tablet Take 15 mg by mouth daily.    [provider]  polyvinyl alcohol (LIQUIFILM TEARS) 1.4 % ophthalmic solution Place 1 drop into the left eye 3 (three) times daily as needed for dry eyes. 11/14/19   Georgette Shell, MD  pravastatin (PRAVACHOL) 40 MG  tablet TAKE 1 TABLET BY MOUTH EVERY DAY IN THE EVENING 11/10/19   Croitoru, Dani Gobble, MD  prochlorperazine (COMPAZINE) 10 MG tablet Take 1 tablet (10 mg total) by mouth every 6 (six) hours as needed for nausea or vomiting. 12/24/19   Curt Bears, MD  rivaroxaban (XARELTO) 10 MG TABS tablet Take 1 tablet (10 mg total) by mouth daily for 21 days. 11/06/19 11/27/19  Griffith Citron R, PA-C  TRINTELLIX 10 MG TABS tablet Take 10 mg by mouth daily. 12/23/19   [provider]  verapamil (CALAN) 120 MG tablet Take 0.5 tablets (60 mg total) by mouth 2 (two) times daily. 11/14/19   Georgette Shell, MD    Allergies    Penicillins, Influenza vaccines, Clarithromycin, Cortisone, and Povidone-iodine  Review of Systems   Review of Systems  Constitutional: Negative for fever.  HENT: Negative for congestion.   Eyes: Negative for visual disturbance.  Respiratory: Negative for shortness of breath.   Cardiovascular: Negative for chest pain.  Gastrointestinal: Negative for abdominal pain.  Genitourinary: Negative for difficulty urinating.  Musculoskeletal: Positive for arthralgias and joint swelling.  Skin: Negative for wound.  Neurological: Negative for headaches.  Psychiatric/Behavioral: Negative for agitation.  All  other systems reviewed and are negative.   Physical Exam Updated Vital Signs BP 131/62   Pulse 84   Resp (!) 25   Ht 5' 5.5" (1.664 m)   Wt 60.3 kg   SpO2 100%   BMI 21.80 kg/m   Physical Exam Vitals and nursing note reviewed.  Constitutional:      General: She is not in acute distress.    Appearance: Normal appearance.  HENT:     Head: Normocephalic and atraumatic.     Nose: Nose normal.     Mouth/Throat:     Mouth: Mucous membranes are moist.     Pharynx: Oropharynx is clear.  Eyes:     Conjunctiva/sclera: Conjunctivae normal.     Pupils: Pupils are equal, round, and reactive to light.  Neck:     Comments: c collar in place  Cardiovascular:     Rate and Rhythm: Normal rate and regular rhythm.     Pulses: Normal pulses.     Heart sounds: Normal heart sounds.  Pulmonary:     Effort: Pulmonary effort is normal.     Breath sounds: Normal breath sounds.  Abdominal:     General: Abdomen is flat. Bowel sounds are normal.     Palpations: Abdomen is soft.     Tenderness: There is no abdominal tenderness.  Musculoskeletal:     Right wrist: Normal.     Left wrist: Deformity and tenderness present. Decreased range of motion.     Right hand: Normal.     Left hand: Normal.     Left hip: Deformity present.     Right ankle: Normal.     Right Achilles Tendon: Normal.     Left ankle: Normal.     Left Achilles Tendon: Normal.     Right foot: Normal.     Left foot: Normal.  Skin:    General: Skin is warm and dry.     Capillary Refill: Capillary refill takes less than 2 seconds.  Neurological:     General: No focal deficit present.     Mental Status: She is alert and oriented to person, place, and time.     Deep Tendon Reflexes: Reflexes normal.  Psychiatric:        Mood and Affect: Mood normal.  Behavior: Behavior normal.     ED Results / Procedures / Treatments   Labs (all labs ordered are listed, but only abnormal results are displayed) Results for orders  placed or performed during the hospital encounter of 12/31/19  CBC with Differential/Platelet  Result Value Ref Range   WBC 8.4 4.0 - 10.5 K/uL   RBC 2.94 (L) 3.87 - 5.11 MIL/uL   Hemoglobin 8.1 (L) 12.0 - 15.0 g/dL   HCT 27.0 (L) 36 - 46 %   MCV 91.8 80.0 - 100.0 fL   MCH 27.6 26.0 - 34.0 pg   MCHC 30.0 30.0 - 36.0 g/dL   RDW 15.4 11.5 - 15.5 %   Platelets 300 150 - 400 K/uL   nRBC 0.0 0.0 - 0.2 %   Neutrophils Relative % 79 %   Neutro Abs 6.6 1.7 - 7.7 K/uL   Lymphocytes Relative 14 %   Lymphs Abs 1.1 0.7 - 4.0 K/uL   Monocytes Relative 5 %   Monocytes Absolute 0.5 0 - 1 K/uL   Eosinophils Relative 1 %   Eosinophils Absolute 0.1 0 - 0 K/uL   Basophils Relative 0 %   Basophils Absolute 0.0 0 - 0 K/uL   Immature Granulocytes 1 %   Abs Immature Granulocytes 0.05 0.00 - 0.07 K/uL  Protime-INR  Result Value Ref Range   Prothrombin Time 14.5 11.4 - 15.2 seconds   INR 1.2 0.8 - 1.2   MR BRAIN W WO CONTRAST  Result Date: 12/08/2019 CLINICAL DATA:  79 year old female with non-small cell lung cancer. Staging. EXAM: MRI HEAD WITHOUT AND WITH CONTRAST TECHNIQUE: Multiplanar, multiecho pulse sequences of the brain and surrounding structures were obtained without and with intravenous contrast. CONTRAST:  18mL GADAVIST GADOBUTROL 1 MMOL/ML IV SOLN COMPARISON:  PET-CT today and earlier. FINDINGS: Brain: No restricted diffusion to suggest acute infarction. No midline shift, mass effect, evidence of mass lesion, ventriculomegaly, extra-axial collection or acute intracranial hemorrhage. Cervicomedullary junction and pituitary are within normal limits. 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 No dural thickening. Scattered mild for age nonspecific cerebral white matter T2 and FLAIR hyperintensity with no cortical encephalomalacia or definite chronic cerebral blood products. Vascular: Major intracranial vascular flow  voids are preserved. The major dural venous sinuses are enhancing and appear to be patent. 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 A right fronto polar arterial branch appears to arise near the base of the aneurysm on series 16, image 61. Skull and upper cervical spine: Negative for age visible cervical spine. No suspicious skull lesion. Sinuses/Orbits: Negative orbits. Chronic appearing left maxillary sinusitis with mucoperiosteal thickening. Other paranasal sinuses are clear. Other: Mastoids are clear. Visible internal auditory structures appear normal. Scalp and face soft tissues appear negative. IMPRESSION: 1. 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 Neuro-endovascular (Neuro-Interventional Radiology or Neurosurgery) consultation is recommended. 2. No other acute intracranial abnormality. Chronic left maxillary sinusitis. These results will be called to the ordering clinician or representative by the Radiologist Assistant, and communication documented in the PACS or [employer]. Electronically Signed   By: Genevie Ann M.D.   On: 12/08/2019 14:29   NM PET Image Restag (PS) Skull Base To Thigh  Result Date: 12/08/2019 CLINICAL DATA:  Initial treatment strategy for lung mass. EXAM: NUCLEAR MEDICINE PET SKULL  BASE TO THIGH TECHNIQUE: 7.21 mCi F-18 FDG was injected intravenously. Full-ring PET imaging was performed from the skull base to thigh after the radiotracer. CT data was obtained and used for attenuation correction and anatomic localization. Fasting blood glucose: 138 mg/dl COMPARISON:  PET-CT from 06/05/2018 FINDINGS: Mediastinal blood pool activity: SUV max 4.5 Liver activity: SUV max NA NECK: No hypermetabolic lymph nodes in the neck. Incidental CT findings: Asymmetric opacification of the left mastoid air cells with evidence of chronic  mucoperiosteal thickening. CHEST: The necrotic lobe lung mass is intensely hypermetabolic measuring 6.4 x 5.8 cm and has an SUV max of 19.82. Central photopenia noted compatible with tumor necrosis. Tumor involvement of the left hilum is noted with obstruction of the lingular bronchus resulting in postobstructive atelectasis and consolidation. On the previous PET-CT mass measured 2.0 x 2.3 cm with SUV max of 14.07. FDG avid left pre-vascular lymph node measures 0.8 cm with SUV max of 4.6. FDG avid left lower paratracheal lymph node measures 1.0 cm within SUV max of 5.13. FDG avid right paratracheal lymph node measures 1.1 cm within SUV max of 4.37. No hypermetabolic supraclavicular or axillary lymph nodes. Incidental CT findings: Paraseptal and centrilobular emphysema. Diffuse, bilateral subpleural reticulation and mild traction bronchiectasis identified suggesting superimposed interstitial lung disease. ABDOMEN/PELVIS: No abnormal FDG uptake within the liver, spleen, pancreas, or adrenal glands. No FDG avid lymph nodes within the abdomen or pelvis. No inguinal adenopathy. Incidental CT findings: Aortic atherosclerosis. No aneurysm. Cholecystectomy. SKELETON: No focal hypermetabolic activity to suggest skeletal metastasis. Incidental CT findings: none IMPRESSION: 1. Necrotic left upper lobe lung mass is identified which demonstrates considerable increase in size from 06/05/2018. Mass invades the left hilum and there is associated post obstructive atelectasis/consolidation of the lingula. 2. FDG avid bilateral mediastinal lymph nodes worrisome for metastatic adenopathy. 3. No findings of nodal metastasis or solid organ metastasis within the abdomen or pelvis. No findings of bone metastasis. 4. Emphysema with superimposed signs of chronic interstitial lung disease. 5. Aortic atherosclerosis and coronary artery calcifications. 6. Aortic Atherosclerosis (ICD10-I70.0) and Emphysema (ICD10-J43.9). Electronically Signed    By: Kerby Moors M.D.   On: 12/08/2019 14:04    Radiology No results found.  Procedures Procedures (including critical care time)  Medications Ordered in ED Medications  fentaNYL (SUBLIMAZE) injection 50 mcg (50 mcg Intravenous Given 12/31/19 0435)     ED Course  I have reviewed the triage vital signs and the nursing notes.  Pertinent labs & imaging results that were available during my care of the patient were reviewed by me and considered in my medical decision making (see chart for details).    Case d/w Dr. Veverly Fells who saw the patient in consult in the ED  540 case d/w Dr. Claudia Desanctis who will seen the patient in consult    The wrist fracture may in fact be old.  The ulnar styloid fracture is new but the radius may be from a previous injury per radiology.  Will be splinted pending further evaluation  7 am Ortho tech is currently present for splinting of the left wrist  Final Clinical Impression(s) / ED Diagnoses Admit to medicine    Zadie Deemer, MD 12/31/19 2951    Randal Buba, Curties Conigliaro, MD 12/31/19 0700

## 2019-12-31 NOTE — Anesthesia Preprocedure Evaluation (Addendum)
Anesthesia Evaluation  Patient identified by MRN, date of birth, ID band Patient awake    Reviewed: Allergy & Precautions, NPO status , Patient's Chart, lab work & pertinent test results  Airway Mallampati: II  TM Distance: >3 FB Neck ROM: Full    Dental  (+) Edentulous Upper, Poor Dentition, Missing,    Pulmonary shortness of breath, at rest and Long-Term Oxygen Therapy, former smoker,  Interstitial lung disease- chronic respiratory failure, on home oxygen 2LPM  Former smoker, quit 1978, 45 pack year history  Hx lung ca- nonoperative   Pulmonary exam normal  + decreased breath sounds      Cardiovascular Exercise Tolerance: Poor hypertension, Pt. on medications + CAD and +CHF (diastolic dysfunction)  Normal cardiovascular exam Rhythm:Regular Rate:Normal  Echo 10/2019: 1. Left ventricular ejection fraction, by estimation, is 65 to 70%. The left ventricle has normal function. The left ventricle has no regional wall motion abnormalities. Left ventricular diastolic parameters are consistent with Grade I diastolic dysfunction (impaired relaxation).  2. Right ventricular systolic function is normal. The right ventricular size is normal. There is moderately elevated pulmonary artery systolic pressure. The estimated right ventricular systolic pressure is 29.5 mmHg.  3. The mitral valve is grossly normal. Trivial mitral valve  regurgitation.  4. The aortic valve is tricuspid. Aortic valve regurgitation is not visualized.  5. The inferior vena cava is normal in size with greater than 50% respiratory variability, suggesting right atrial pressure of 3 mmHg.    Neuro/Psych PSYCHIATRIC DISORDERS Anxiety Depression Dementia negative neurological ROS     GI/Hepatic Neg liver ROS, GERD  Medicated and Controlled,  Endo/Other  diabetes, Well Controlled, Type 2, Oral Hypoglycemic AgentsHypothyroidism   Renal/GU negative Renal ROS  negative  genitourinary   Musculoskeletal Left femoral neck fx, left distal radius fx    Abdominal Normal abdominal exam  (+)   Peds negative pediatric ROS (+)  Hematology  (+) Blood dyscrasia, anemia , hct 27, plt 300   Anesthesia Other Findings   Reproductive/Obstetrics negative OB ROS                          Anesthesia Physical Anesthesia Plan  ASA: IV  Anesthesia Plan: Spinal   Post-op Pain Management:    Induction:   PONV Risk Score and Plan: 3 and Treatment may vary due to age or medical condition, Propofol infusion and TIVA  Airway Management Planned: Natural Airway and Simple Face Mask  Additional Equipment: None  Intra-op Plan:   Post-operative Plan:   Informed Consent: I have reviewed the patients History and Physical, chart, labs and discussed the procedure including the risks, benefits and alternatives for the proposed anesthesia with the patient or authorized representative who has indicated his/her understanding and acceptance.     Dental advisory given  Plan Discussed with: CRNA  Anesthesia Plan Comments: (Extremely frail and deconditioned, on home oxygen due to chronic respiratory issues. Explained to pt and family possibility of postoperative ventilation. D/w pt possibility of blood transfusion intraoperatively. Will do spinal as she is a pulmonary cripple, will hold off on regional anesthesia for block as she is not having pain in wrist and ortho to attempt closed reduction.)      Anesthesia Quick Evaluation

## 2019-12-31 NOTE — Anesthesia Procedure Notes (Signed)
Spinal  Patient location during procedure: OR Start time: 12/31/2019 4:57 PM End time: 12/31/2019 5:02 PM Staffing Performed: anesthesiologist  Anesthesiologist: Catalina Gravel, MD Preanesthetic Checklist Completed: patient identified, IV checked, risks and benefits discussed, surgical consent, monitors and equipment checked, pre-op evaluation and timeout performed Spinal Block Patient position: left lateral decubitus Prep: DuraPrep and site prepped and draped Patient monitoring: continuous pulse ox and blood pressure Approach: midline Location: L3-4 Injection technique: single-shot Needle Needle type: Pencan  Needle gauge: 24 G Assessment Sensory level: T8 Additional Notes Functioning IV was confirmed and monitors were applied. Sterile prep and drape, including hand hygiene, mask and sterile gloves were used. The patient was positioned and the spine was prepped. The skin was anesthetized with lidocaine.  Free flow of clear CSF was obtained prior to injecting local anesthetic into the CSF.  The spinal needle aspirated freely following injection.  The needle was carefully withdrawn.  The patient tolerated the procedure well. Consent was obtained prior to procedure with all questions answered and concerns addressed. Risks including but not limited to bleeding, infection, nerve damage, paralysis, failed block, inadequate analgesia, allergic reaction, high spinal, itching and headache were discussed and the patient wished to proceed.   Hoy Morn, MD

## 2019-12-31 NOTE — Anesthesia Procedure Notes (Signed)
Procedure Name: MAC Date/Time: 12/31/2019 5:54 PM Performed by: Teressa Lower., CRNA Pre-anesthesia Checklist: Patient identified, Emergency Drugs available, Suction available, Patient being monitored and Timeout performed Patient Re-evaluated:Patient Re-evaluated prior to induction Oxygen Delivery Method: Simple face mask Ventilation: Nasal airway inserted- appropriate to patient size

## 2019-12-31 NOTE — Transfer of Care (Signed)
Immediate Anesthesia Transfer of Care Note  Patient: [name]  Procedure(s) Performed: LEFT TOTAL HIP ARTHROPLASTY ANTERIOR APPROACH (Left Hip) CLOSED REDUCTION LEFT DISTAL RADIUS FRACTURE (Left )  Patient Location: PACU  Anesthesia Type:Spinal  Level of Consciousness: awake, alert  and oriented  Airway & Oxygen Therapy: Patient Spontanous Breathing and Patient connected to face mask oxygen  Post-op Assessment: Report given to RN and Post -op Vital signs reviewed and stable  Post vital signs: Reviewed and stable  Last Vitals:  Vitals Value Taken Time  BP 129/55 12/31/19 1912  Temp    Pulse 78 12/31/19 1912  Resp 11 12/31/19 1912  SpO2 100 % 12/31/19 1912  Vitals shown include unvalidated device data.  Last Pain:  Vitals:   12/31/19 1442  TempSrc:   PainSc: 7       Patients Stated Pain Goal: 3 (27/87/18 3672)  Complications: No complications documented.

## 2019-12-31 NOTE — Consult Note (Signed)
Left wrist films reviewed.  Recommend non contrast ct scan left wrist to clarify fracture details and acuity.

## 2019-12-31 NOTE — Consult Note (Signed)
ORTHOPAEDIC CONSULTATION  REQUESTING PHYSICIAN: Harold Hedge, MD  Chief Complaint: Left femoral neck fracture, left distal radius fracture, left knee pain  HPI:  79 year old female with past medical history of chronic hypoxic respiratory failure with ILD on 2 L at baseline follows with Dr. [name], diabetes, brain aneurysm, recent right hip replacement in May, stage III squamous cell lung cancer (follows with Dr. [name]) who presented to [facility] ED on 7/22 after a fall from ground-level after getting up from sitting position to go to the bathroom and fell sideways without LOC with subsequent left hip and wrist pain.  Ortho consulted for surgical evaluation.  Past Medical History:  Diagnosis Date  . Bronchiectasis    PFTs August 23, 2010 VC 82%   dlco 62 > 129 CORRECTED  . Chronic diarrhea   . Chronic rhinitis    sinus CT ordered August 23, 2010  . Diabetes mellitus ([place])   . Hyperplastic colon polyp   . IBS (irritable bowel syndrome)   . ILD (interstitial lung disease) ([place])   . Stenosis of rectum and anus   . Thyroid disease    Past Surgical History:  Procedure Laterality Date  .  gallbaldder    . BRONCHIAL BIOPSY  11/13/2019   Procedure: BRONCHIAL BIOPSIES;  Surgeon: Collene Gobble, MD;  Location: [facility];  Service: Cardiopulmonary;;  . BRONCHIAL BRUSHINGS  11/13/2019   Procedure: BRONCHIAL BRUSHINGS;  Surgeon: Collene Gobble, MD;  Location: [facility];  Service: Cardiopulmonary;;  . CHOLECYSTECTOMY    . DILATION AND CURETTAGE OF UTERUS    . HAND SURGERY  1991   left hand; Baker's Cyst removed  . migraine    . tear duct surgery    . TOTAL HIP ARTHROPLASTY Right 11/04/2019   Procedure: TOTAL HIP ARTHROPLASTY ANTERIOR APPROACH;  Surgeon: Paralee Cancel, MD;  Location: [facility];  Service: Orthopedics;  Laterality: Right;  [name] VIDEO BRONCHOSCOPY N/A 11/13/2019   Procedure: VIDEO BRONCHOSCOPY;  Surgeon: Collene Gobble, MD;  Location: [facility];  Service: Cardiopulmonary;   Laterality: N/A;   Social History   Socioeconomic History  . Marital status: Married    Spouse name: Not on file  . Number of children: Not on file  . Years of education: Not on file  . Highest education level: Not on file  Occupational History  . Occupation: retired  Tobacco Use  . Smoking status: Former Smoker    Packs/day: 3.00    Years: 15.00    Pack years: 45.00    Types: Cigarettes    Quit date: 06/11/1976    Years since quitting: 43.5  . Smokeless tobacco: Never Used  Substance and Sexual Activity  . Alcohol use: No  . Drug use: No  . Sexual activity: Not on file  Other Topics Concern  . Not on file  Social History Narrative   Daily caffeine use   Social Determinants of Health   Financial Resource Strain:   . Difficulty of Paying Living Expenses:   Food Insecurity:   . Worried About Charity fundraiser in the Last Year:   . Arboriculturist in the Last Year:   Transportation Needs:   . Film/video editor (Medical):   [name] Lack of Transportation (Non-Medical):   Physical Activity:   . Days of Exercise per Week:   . Minutes of Exercise per Session:   Stress:   . Feeling of Stress :   Social Connections:   . Frequency of Communication with  Friends and Family:   . Frequency of Social Gatherings with Friends and Family:   . Attends Religious Services:   . Active Member of Clubs or Organizations:   . Attends Archivist Meetings:   [name] Marital Status:    Family History  Problem Relation Age of Onset  . Colon polyps Sister   . Heart disease Sister   . Diabetes Sister   . Heart disease Mother   . Colon cancer Neg Hx    Allergies  Allergen Reactions  . Penicillins Diarrhea    Has patient had a PCN reaction causing immediate rash, facial/tongue/throat swelling, SOB or lightheadedness with hypotension: Unknown Has patient had a PCN reaction causing severe rash involving mucus membranes or skin necrosis: Unknown Has patient had a PCN reaction that  required hospitalization: No  Has patient had a PCN reaction occurring within the last 10 years: No  If all of the above answers are "NO", then may proceed with Cephalosporin use.   . Influenza Vaccines Other (See Comments)    Severe nausea, vomiting and body aches-md told her not to take it anymore  . Clarithromycin     Unknown reaction   . Cortisone Other (See Comments)    "Made her feel like ice water was running through her veins"   . Povidone-Iodine     Unknown reaction    Prior to Admission medications   Medication Sig Start Date End Date Taking? Authorizing Provider  Ascorbic Acid (VITAMIN C) 1000 MG tablet Take 1,000 mg by mouth at bedtime.    Yes [provider]  benzonatate (TESSALON) 100 MG capsule TAKE 1 CAPSULE (100 MG TOTAL) BY MOUTH EVERY 8 (EIGHT) HOURS AS NEEDED FOR COUGH. Patient taking differently: Take 100 mg by mouth in the morning.  11/18/18  Yes Croitoru, Mihai, MD  Biotin 10000 MCG TABS Take 10,000 mcg by mouth daily.    Yes [provider]  Calcium-Magnesium-Vitamin D (CALCIUM 1200+D3 PO) Take 1 tablet by mouth at bedtime.   Yes [provider]  clorazepate (TRANXENE) 3.75 MG tablet TAKE 1 TABLET BY MOUTH DAILY.MAY TAKE UP TO 2 DAILY IF NEEDED Patient taking differently: Take 3.75 mg by mouth daily. MAY TAKE UP TO 2 DAILY IF NEEDED 11/14/19  Yes Georgette Shell, MD  cyclobenzaprine (FLEXERIL) 10 MG tablet Take 10 mg by mouth at bedtime.    Yes [provider]  docusate sodium (COLACE) 100 MG capsule Take 1 capsule (100 mg total) by mouth 2 (two) times daily. Patient taking differently: Take 100 mg by mouth daily.  11/05/19  Yes Maurice March, PA-C  ferrous sulfate 325 (65 FE) MG tablet Take 1 tablet (325 mg total) by mouth 3 (three) times daily after meals for 14 days. Patient taking differently: Take 325 mg by mouth daily with breakfast.  11/05/19 12/31/19 Yes Stinson, Nicola Girt, PA-C  levothyroxine (SYNTHROID, LEVOTHROID) 88  MCG tablet Take 88 mcg by mouth daily before breakfast.    Yes [provider]  Multiple Vitamin (MULTIVITAMIN) tablet Take 1 tablet by mouth daily.     Yes [provider]  pioglitazone (ACTOS) 15 MG tablet Take 15 mg by mouth daily.   Yes [provider]  pravastatin (PRAVACHOL) 40 MG tablet TAKE 1 TABLET BY MOUTH EVERY DAY IN THE EVENING Patient taking differently: Take 40 mg by mouth daily.  11/10/19  Yes Croitoru, Mihai, MD  prochlorperazine (COMPAZINE) 10 MG tablet Take 1 tablet (10 mg total) by mouth every  6 (six) hours as needed for nausea or vomiting. 12/24/19  Yes Curt Bears, MD  TRINTELLIX 10 MG TABS tablet Take 10 mg by mouth daily. 12/23/19  Yes [provider]  calcium carbonate (TUMS - DOSED IN MG ELEMENTAL CALCIUM) 500 MG chewable tablet Chew 1 tablet (200 mg of elemental calcium total) by mouth 3 (three) times daily with meals. Patient not taking: Reported on 12/31/2019 11/14/19   Georgette Shell, MD  celecoxib (CELEBREX) 200 MG capsule Take 1 capsule (200 mg total) by mouth 2 (two) times daily. Patient not taking: Reported on 12/31/2019 11/05/19   Maurice March, PA-C  HYDROcodone-acetaminophen (NORCO/VICODIN) 5-325 MG tablet Take 1-2 tablets by mouth every 6 (six) hours as needed for moderate pain or severe pain (pain score 4-6). Patient not taking: Reported on 12/31/2019 11/14/19   Georgette Shell, MD  menthol-cetylpyridinium (CEPACOL) 3 MG lozenge Take 1 lozenge (3 mg total) by mouth as needed for sore throat (sore throat). Patient not taking: Reported on 12/31/2019 11/14/19   Georgette Shell, MD  phenol (CHLORASEPTIC) 1.4 % LIQD Use as directed 1 spray in the mouth or throat as needed for throat irritation / pain. Patient not taking: Reported on 12/31/2019 11/14/19   Georgette Shell, MD  polyvinyl alcohol (LIQUIFILM TEARS) 1.4 % ophthalmic solution Place 1 drop into the left eye 3 (three) times daily as needed for dry  eyes. Patient not taking: Reported on 12/31/2019 11/14/19   Georgette Shell, MD  rivaroxaban (XARELTO) 10 MG TABS tablet Take 1 tablet (10 mg total) by mouth daily for 21 days. Patient not taking: Reported on 12/31/2019 11/06/19 11/27/19  Maurice March, PA-C  verapamil (CALAN) 120 MG tablet Take 0.5 tablets (60 mg total) by mouth 2 (two) times daily. Patient not taking: Reported on 12/31/2019 11/14/19   Georgette Shell, MD   DG Wrist 2 Views Left  Result Date: 12/31/2019 CLINICAL DATA:  Fall with injury to left wrist. EXAM: LEFT WRIST - 2 VIEW COMPARISON:  Prior report 07/07/1999. FINDINGS: Angulated fracture of the distal left radius. This appears to be old and was previously described on prior study of 07/07/1999. A follow-up exam in 7-10 days may prove useful to ensure stable appearance. Displaced fracture of the ulnar styloid. This appears acute. Diffuse degenerative change. Diffuse soft tissue swelling. No radiopaque foreign body. IMPRESSION: 1. Angulated fracture of the distal left radius. This appears to be old was previously described on prior study of 07/07/1999. A follow-up exam in 7-10 days may prove useful to ensure stable appearance. 2.  Displaced fracture of the ulnar styloid.  This appears acute. 3.  Diffuse degenerative change.  Diffuse soft tissue swelling. Electronically Signed   By: Marcello Moores  Register   On: 12/31/2019 05:33   CT Head Wo Contrast  Result Date: 12/31/2019 CLINICAL DATA:  Facial trauma.  Fell on [employer] EXAM: CT HEAD WITHOUT CONTRAST CT CERVICAL SPINE WITHOUT CONTRAST TECHNIQUE: Multidetector CT imaging of the head and cervical spine was performed following the standard protocol without intravenous contrast. Multiplanar CT image reconstructions of the cervical spine were also generated. COMPARISON:  Head MRI 12/08/2019.  Neck CTA 06/11/2010. FINDINGS: CT HEAD FINDINGS Brain: There is no evidence of acute infarct, intracranial hemorrhage, mass, midline shift, or  extra-axial fluid collection. There is mild cerebral atrophy with unchanged enlargement of the extra-axial CSF spaces over the frontal convexities. Hypodensities in the cerebral white matter bilaterally are nonspecific but compatible with mild chronic small vessel ischemic disease.  Vascular: Calcified atherosclerosis at the skull base. 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 Skull: No fracture suspicious osseous lesion. Sinuses/Orbits: Chronic left maxillary sinusitis with essentially complete opacification of the sinus. Clear mastoid air cells. Unremarkable orbits. Other: None. CT CERVICAL SPINE FINDINGS Alignment: Mild reversal of the normal cervical lordosis. Trace anterolisthesis of C3 on C4. Skull base and vertebrae: No acute fracture or suspicious osseous lesion. Soft tissues and spinal canal: No prevertebral fluid or swelling. No visible canal hematoma. Disc levels: Moderate disc space narrowing at C5-6 with asymmetric left uncovertebral spurring resulting in mild left neural foraminal stenosis. Severe facet arthrosis on the right at C2-3 and on the left at C3-4 and C4-5. Upper chest: Biapical pleuroparenchymal lung scarring, centrilobular emphysema, and biapical nodular densities similar to a 12/08/2019 PET-CT. Other: Mild calcified atherosclerosis at the left carotid bifurcation. IMPRESSION: 1. No evidence of acute intracranial abnormality. 2. Mild chronic small vessel ischemic disease. 3. Known 1 cm right supraclinoid ICA aneurysm. 4. No acute cervical spine fracture. 5. Emphysema (ICD10-J43.9). Electronically Signed   By: Logan Bores M.D.   On: 12/31/2019 06:12   CT Cervical Spine Wo Contrast  Result Date: 12/31/2019 CLINICAL DATA:  Facial trauma.  Fell on [employer] EXAM: CT HEAD WITHOUT CONTRAST CT CERVICAL SPINE WITHOUT CONTRAST TECHNIQUE: Multidetector CT imaging of the head and cervical spine was performed following the standard  protocol without intravenous contrast. Multiplanar CT image reconstructions of the cervical spine were also generated. COMPARISON:  Head MRI 12/08/2019.  Neck CTA 06/11/2010. FINDINGS: CT HEAD FINDINGS Brain: There is no evidence of acute infarct, intracranial hemorrhage, mass, midline shift, or extra-axial fluid collection. There is mild cerebral atrophy with unchanged enlargement of the extra-axial CSF spaces over the frontal convexities. Hypodensities in the cerebral white matter bilaterally are nonspecific but compatible with mild chronic small vessel ischemic disease. Vascular: Calcified atherosclerosis at the skull base. 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 Skull: No fracture suspicious osseous lesion. Sinuses/Orbits: Chronic left maxillary sinusitis with essentially complete opacification of the sinus. Clear mastoid air cells. Unremarkable orbits. Other: None. CT CERVICAL SPINE FINDINGS Alignment: Mild reversal of the normal cervical lordosis. Trace anterolisthesis of C3 on C4. Skull base and vertebrae: No acute fracture or suspicious osseous lesion. Soft tissues and spinal canal: No prevertebral fluid or swelling. No visible canal hematoma. Disc levels: Moderate disc space narrowing at C5-6 with asymmetric left uncovertebral spurring resulting in mild left neural foraminal stenosis. Severe facet arthrosis on the right at C2-3 and on the left at C3-4 and C4-5. Upper chest: Biapical pleuroparenchymal lung scarring, centrilobular emphysema, and biapical nodular densities similar to a 12/08/2019 PET-CT. Other: Mild calcified atherosclerosis at the left carotid bifurcation. IMPRESSION: 1. No evidence of acute intracranial abnormality. 2. Mild chronic small vessel ischemic disease. 3. Known 1 cm right supraclinoid ICA aneurysm. 4. No acute cervical spine fracture. 5. Emphysema (ICD10-J43.9). Electronically Signed   By: Logan Bores M.D.   On:  12/31/2019 06:12   CT WRIST LEFT WO CONTRAST  Result Date: 12/31/2019 CLINICAL DATA:  Evaluate left wrist fracture EXAM: CT OF THE LEFT WRIST WITHOUT CONTRAST TECHNIQUE: Multidetector CT imaging was performed according to the standard protocol. Multiplanar CT image reconstructions were also generated. COMPARISON:  X-ray 12/31/2019 FINDINGS: Technical note: Examination is degraded by beam hardening artifact related to patient positioning of the wrist overlying the abdomen. Bones/Joint/Cartilage Acute impacted  fracture of the distal radial metaphysis with intra-articular extension to the radiocarpal joint. Approximately 6 mm of impaction. Large dorsal fracture fragment of the distal radius is displaced dorsally up to 5 mm resulting in significant radiocarpal articular surface diastasis (series 4, image 21). Acute mildly displaced ulnar styloid fracture (series 5, image 22) Carpal bones appear intact without fracture or malalignment. Ligaments Suboptimally assessed by CT. Muscles and Tendons Suboptimally evaluated given the degree of beam hardening. Soft tissues Diffuse soft tissue swelling the level of the wrist. No well-defined fluid collection. IMPRESSION: 1. Acute impacted fracture of the distal radial metaphysis with intra-articular extension to the radiocarpal joint. 2. Dominant dorsal fracture fragment of the distal radius is displaced dorsally up to 5 mm with resultant radiocarpal articular surface diastasis. 3. Acute mildly displaced ulnar styloid fracture. Electronically Signed   By: Davina Poke D.O.   On: 12/31/2019 13:23   DG Chest Portable 1 View  Result Date: 12/31/2019 CLINICAL DATA:  Fall. EXAM: PORTABLE CHEST 1 VIEW COMPARISON:  CT chest 11/04/2019, 05/14/2018. Chest x-ray 11/03/2019, 05/13/2018. PET CT 06/05/2018. FINDINGS: Mediastinum 1. Hilar structures are stable. Large mass left upper lung again noted again concerning for a neoplasm. Chronic interstitial lung disease again noted  without interim change. Tiny left pleural effusion cannot be excluded. Biapical pleural thickening again noted consistent scarring. Heart size stable. Surgical clips right upper quadrant. Degenerative change thoracic spine. No acute bony abnormality identified. IMPRESSION: 1. Large mass left upper lung again noted. Although infectious etiology cannot be excluded. This finding is concerning for a neoplasm such as bronchogenic carcinoma. Similar findings noted on prior exam. Tiny left pleural effusion cannot be excluded. 2. Chronic interstitial lung disease again noted without interim change. 3. No acute bony abnormality identified. No evidence of pneumothorax. Electronically Signed   By: Marcello Moores  Register   On: 12/31/2019 05:26   DG HIP PORT UNILAT WITH PELVIS 1V LEFT  Result Date: 12/31/2019 CLINICAL DATA:  Fall with injury to left hip. EXAM: DG HIP (WITH OR WITHOUT PELVIS) 1V PORT LEFT COMPARISON:  PET-CT 12/08/2019. FINDINGS: Diffuse osteopenia. Degenerative changes lumbar spine and left hip. Total right hip replacement. Hardware intact. Anatomic alignment. Angulated comminuted fracture of the left femoral neck noted. No evidence of dislocation. IMPRESSION: Angulated comminuted fracture of the left femoral neck. Electronically Signed   By: Marcello Moores  Register   On: 12/31/2019 05:29    All pertinent xrays, MRI, CT independently reviewed and interpreted  Positive ROS: All other systems have been reviewed and were otherwise negative with the exception of those mentioned in the HPI and as above.  Physical Exam: General: No acute distress Cardiovascular: No pedal edema Respiratory: No cyanosis, no use of accessory musculature GI: No organomegaly, abdomen is soft and non-tender Skin: No lesions in the area of chief complaint Neurologic: Sensation intact distally Psychiatric: Patient is at baseline mood and affect Lymphatic: No axillary or cervical lymphadenopathy  MUSCULOSKELETAL:  - severe pain with  movement of the hip and extremity - skin intact - NVI distally - compartments soft - left knee is not tender to deep palpation throughout - NVI LUE - well fitting wrist splint  Assessment: Left femoral neck fracture Left distal radius fracture Left knee pain  Plan: - will attempt for closed reduction of left distal radius fracture under MAC, with possible ORIF if unable to keep reduction - plan for left THA for femoral neck fracture - informed consent obtained from son over phone - will obtain left knee xrays  Thank you for the consult and the opportunity to see Ms. Kleist  N. Eduard Roux, MD [facility] 4:27 PM

## 2019-12-31 NOTE — Progress Notes (Signed)
Initial Nutrition Assessment  DOCUMENTATION CODES:   Severe malnutrition in context of chronic illness  INTERVENTION:   Once diet advanced: -Glucerna Shake po TID, each supplement provides 220 kcal and 10 grams of protein -Ensure MAX Protein po daily, each supplement provides 150 kcal and 30 grams of protein -Magic cup BID with meals, each supplement provides 290 kcal and 9 grams of protein  NUTRITION DIAGNOSIS:   Severe Malnutrition related to chronic illness, cancer and cancer related treatments, hip fracture as evidenced by percent weight loss, energy intake < or equal to 75% for > or equal to 1 month, moderate fat depletion, severe muscle depletion.  GOAL:   Patient will meet greater than or equal to 90% of their needs  MONITOR:   Diet advancement, Labs, Weight trends, I & O's  REASON FOR ASSESSMENT:   Consult Hip fracture protocol  ASSESSMENT:   : 79 yo female who reports a ground level fall today at home.  She says she got up to go to the bathroom and started falling sideways. She complains of immediate hip pain on the left. She had her right hip replaced in May. She is also dealing with Stage III lung cancer (squamous). Admited for left hip fracture.  Pt currently NPO, awaiting transfer to [facility] for hip surgery.  Patient currently drowsy from receiving pain medications. Pt's daughter and son at bedside. Pt's daughter reports pt's appetite has decreased over the past year and I worsened after her husband died a few months ago.  Pt lives with son who reports she will sometimes eat oatmeal, eggs or cereal for breakfast and he will cook her vegetables with her dinners. Pt likes Glucerna shakes but sometimes these will fill her up and she won't want to eat any food. Pt's son has frozen some yogurt for her to have as a treat. RD to order Glucerna shakes, Ensure Max and Magic cups to try following her surgery.  Per weight records, pt has lost 26 lbs (16% wt loss x 4.5 months,  significant for time frame).  Labs reviewed. Medications reviewed.  NUTRITION - FOCUSED PHYSICAL EXAM:    Most Recent Value  Orbital Region Moderate depletion  Upper Arm Region Moderate depletion  Thoracic and Lumbar Region Unable to assess  Buccal Region Severe depletion  Temple Region Severe depletion  Clavicle Bone Region Severe depletion  Clavicle and Acromion Bone Region Severe depletion  Scapular Bone Region Unable to assess  Dorsal Hand Severe depletion  Patellar Region Unable to assess  Anterior Thigh Region Unable to assess  Posterior Calf Region Unable to assess  Edema (RD Assessment) None  Hair Reviewed  Eyes Unable to assess  Mouth Reviewed  Skin Reviewed  Nails Unable to assess       Diet Order:   Diet Order            Diet NPO time specified  Diet effective now                 EDUCATION NEEDS:   Education needs have been addressed  Skin:  Skin Assessment: Reviewed RN Assessment  Last BM:  PTA  Height:   Ht Readings from Last 1 Encounters:  12/31/19 5' 5.5" (1.664 m)    Weight:   Wt Readings from Last 1 Encounters:  12/31/19 60.3 kg    BMI:  Body mass index is 21.8 kg/m.  Estimated Nutritional Needs:   Kcal:  1700-1900  Protein:  75-90g  Fluid:  1.7L/day   [name]  Luana Shu, MS, RD, LDN Inpatient Clinical Dietitian Contact information available via Amion

## 2019-12-31 NOTE — Op Note (Addendum)
LEFT TOTAL HIP ARTHROPLASTY ANTERIOR APPROACH, CLOSED REDUCTION LEFT DISTAL RADIUS FRACTURE  Procedure Note [name]   [id number]  Pre-op Diagnosis:  1. left femoral neck fracture 2. left distal radius fracture     Post-op Diagnosis: same   Operative Procedures  1. Left total hip replacement 2. Left distal radius closed reduction with manipulation and sugar tong splinting  Personnel  Surgeon(s): Leandrew Koyanagi, MD  Assist: None  Anesthesia: spinal, MAC  Prosthesis: Depuy Acetabulum: Pinnacle 52 mm Femur: Actis 6 STD Head: 36 mm size: +1.5 Liner: +4 neutral Bearing Type: metal on poly  We first began with closed reduction of the left distal radius fracture.  I reduce the distal radius fracture by using traction and volarly directed force in order to bring the fracture back to alignment and reduction.  This was confirmed under fluoroscopy.  The reduction was held as we placed a sugar tong splint with all bony prominences well-padded.  The splint was allowed to set up.  We then positioned her for the hip replacement portion of the surgery. After informed consent was obtained and the operative extremity marked in the holding area, the patient was brought back to the operating room and placed supine on the HANA table. Next, the operative extremity was prepped and draped in normal sterile fashion. Surgical timeout occurred verifying patient identification, surgical site, surgical procedure and administration of antibiotics.  A modified anterior Smith-Peterson approach to the hip was performed, using the interval between tensor fascia lata and sartorius.  Dissection was carried bluntly down onto the anterior hip capsule. The lateral femoral circumflex vessels were identified and coagulated. A capsulotomy was performed and there was return of fracture hematoma and the capsular flaps tagged for later repair.  The neck osteotomy was performed distal to the fracture line. The femoral head  was removed, the acetabular rim was cleared of soft tissue and attention was turned to reaming the acetabulum.  There was calcified labrum and degenerative changes of the acetabulum.  Sequential reaming was performed under fluoroscopic guidance. We reamed to a size 51 mm, and then impacted the acetabular shell. A 25 mm cancellous screw was placed through the shell for added fixation.  The liner was then placed after irrigation and attention turned to the femur.  After placing the femoral hook, the leg was taken to externally rotated, extended and adducted position taking care to perform soft tissue releases to allow for adequate mobilization of the femur. Soft tissue was cleared from the shoulder of the greater trochanter and the hook elevator used to improve exposure of the proximal femur. Sequential broaching performed up to a size 6. Trial neck and head were placed. The leg was brought back up to neutral and the construct reduced.  Antibiotic irrigation was placed in the surgical wound and kept for at least 1 minute.  The position and sizing of components, offset and leg lengths were checked using fluoroscopy. Stability of the construct was checked in extension and external rotation without any subluxation or impingement of prosthesis. We dislocated the prosthesis, dropped the leg back into position, removed trial components, and irrigated copiously. The final stem and head was then placed, the leg brought back up, the system reduced and fluoroscopy used to verify positioning.  We irrigated, obtained hemostasis and closed the capsule using #2 ethibond suture.  One gram of vancomycin powder was placed in the surgical bed. The fascia was closed with #1 vicryl plus, the deep fat layer was closed  with 0 vicryl, the subcutaneous layers closed with 2.0 Vicryl Plus and the skin closed with 2.0 nylon and dermabond. A sterile dressing was applied. The patient was awakened in the operating room and taken to recovery in  stable condition.  All sponge, needle, and instrument counts were correct at the end of the case.   Position: supine  Complications: see description of procedure.  Time Out: performed   Drains/Packing: none  Estimated blood loss: see anesthesia record  Returned to Recovery Room: in good condition.   Antibiotics: yes   Mechanical VTE (DVT) Prophylaxis: sequential compression devices, TED thigh-high  Chemical VTE (DVT) Prophylaxis: lovenox   Fluid Replacement: see anesthesia record  Specimens Removed: 1 to pathology   Sponge and Instrument Count Correct? yes   PACU: portable radiograph - low AP   Plan/RTC: Return in 2 weeks for staple removal. Weight Bearing/Load Lower Extremity: full  Hip precautions: none Suture Removal: 2 weeks   N. Eduard Roux, MD [facility] 6:46 PM   Implant Name Type Inv. Item Serial No. Manufacturer Lot No. LRB No. Used Action  LOG 737110 -  BIOMET DVR HAND INNOVATIONS SET - 1          LOG 737110 -  BIOMET/DEPUY ACE LOCKING SMALL FRAGMENT SET - 1          LINER NEUTRAL 52X36MM PLUS 4 - [id number] Liner LINER NEUTRAL 52X36MM PLUS 4  DEPUY SYNTHES [id number] Left 1 Implanted  PIN SECTOR W/GRIP ACE CUP 52MM - [id number] Hips PIN SECTOR W/GRIP ACE CUP 52MM  DEPUY SYNTHES [id number] Left 1 Implanted  SCREW 6.5MMX25MM - [id number] Screw SCREW 6.5MMX25MM  DEPUY SYNTHES [id number] Left 1 Implanted

## 2019-12-31 NOTE — Progress Notes (Signed)
Called patient's son [name] and let patient speak with her son.  Patient was transferred from [facility] to [facility] for surgery.

## 2019-12-31 NOTE — Anesthesia Postprocedure Evaluation (Signed)
Anesthesia Post Note  Patient: [name]  Procedure(s) Performed: LEFT TOTAL HIP ARTHROPLASTY ANTERIOR APPROACH (Left Hip) CLOSED REDUCTION LEFT DISTAL RADIUS FRACTURE (Left )     Patient location during evaluation: PACU Anesthesia Type: Spinal Level of consciousness: oriented and awake and alert Pain management: pain level controlled Vital Signs Assessment: post-procedure vital signs reviewed and stable Respiratory status: spontaneous breathing, respiratory function stable and patient connected to nasal cannula oxygen Cardiovascular status: blood pressure returned to baseline and stable Postop Assessment: no headache, no backache and no apparent nausea or vomiting Anesthetic complications: no   No complications documented.  Last Vitals:  Vitals:   12/31/19 1955 12/31/19 2010  BP: (!) 106/50 (!) 110/53  Pulse: 82 81  Resp: 19 22  Temp:  (!) 36.3 C  SpO2: 97% 96%    Last Pain:  Vitals:   12/31/19 2010  TempSrc:   PainSc: 7                  Alexys Gassett COKER

## 2020-01-01 ENCOUNTER — Telehealth: Payer: Self-pay | Admitting: Internal Medicine

## 2020-01-01 ENCOUNTER — Inpatient Hospital Stay: Payer: Medicare Other | Admitting: Internal Medicine

## 2020-01-01 ENCOUNTER — Encounter: Payer: Self-pay | Admitting: Internal Medicine

## 2020-01-01 DIAGNOSIS — S52502A Unspecified fracture of the lower end of left radius, initial encounter for closed fracture: Secondary | ICD-10-CM | POA: Diagnosis not present

## 2020-01-01 LAB — GLUCOSE, CAPILLARY
Glucose-Capillary: 130 mg/dL — ABNORMAL HIGH (ref 70–99)
Glucose-Capillary: 120 mg/dL — ABNORMAL HIGH (ref 70–99)
Glucose-Capillary: 109 mg/dL — ABNORMAL HIGH (ref 70–99)
Glucose-Capillary: 172 mg/dL — ABNORMAL HIGH (ref 70–99)

## 2020-01-01 LAB — CBC
HCT: 27.7 % — ABNORMAL LOW (ref 36.0–46.0)
Hemoglobin: 8.7 g/dL — ABNORMAL LOW (ref 12.0–15.0)
MCH: 28.2 pg (ref 26.0–34.0)
MCHC: 31.4 g/dL (ref 30.0–36.0)
MCV: 89.9 fL (ref 80.0–100.0)
Platelets: 261 10*3/uL (ref 150–400)
RBC: 3.08 MIL/uL — ABNORMAL LOW (ref 3.87–5.11)
RDW: 15.3 % (ref 11.5–15.5)
WBC: 8.4 10*3/uL (ref 4.0–10.5)
nRBC: 0 % (ref 0.0–0.2)

## 2020-01-01 LAB — COMPREHENSIVE METABOLIC PANEL
ALT: 13 U/L (ref 0–44)
AST: 20 U/L (ref 15–41)
Albumin: 1.9 g/dL — ABNORMAL LOW (ref 3.5–5.0)
Alkaline Phosphatase: 53 U/L (ref 38–126)
Anion gap: 8 (ref 5–15)
BUN: 6 mg/dL — ABNORMAL LOW (ref 8–23)
CO2: 28 mmol/L (ref 22–32)
Calcium: 9.6 mg/dL (ref 8.9–10.3)
Chloride: 99 mmol/L (ref 98–111)
Creatinine, Ser: 0.43 mg/dL — ABNORMAL LOW (ref 0.44–1.00)
GFR calc Af Amer: >60 mL/min (ref >60)
GFR calc non Af Amer: >60 mL/min (ref >60)
Glucose, Bld: 196 mg/dL — ABNORMAL HIGH (ref 70–99)
Potassium: 3.4 mmol/L — ABNORMAL LOW (ref 3.5–5.1)
Sodium: 135 mmol/L (ref 135–145)
Total Bilirubin: 0.5 mg/dL (ref 0.3–1.2)
Total Protein: 6 g/dL — ABNORMAL LOW (ref 6.5–8.1)

## 2020-01-01 LAB — TYPE AND SCREEN
ABO/RH(D): B POS
Antibody Screen: NEGATIVE
Unit division: 0

## 2020-01-01 LAB — BPAM RBC
Blood Product Expiration Date: 202108202359
ISSUE DATE / TIME: 202107221702
Unit Type and Rh: 7300

## 2020-01-01 MED ORDER — ENSURE MAX PROTEIN PO LIQD
11.0000 [oz_av] | Freq: Every day | ORAL | Status: DC
  Administered 2020-01-02 – 2020-01-04 (×4): 11 [oz_av] via ORAL
  Filled 2020-01-01 (×6): qty 330

## 2020-01-01 MED ORDER — GLUCERNA SHAKE PO LIQD
237.0000 mL | Freq: Three times a day (TID) | ORAL | Status: DC
  Administered 2020-01-01 – 2020-01-10 (×19): 237 mL via ORAL
  Filled 2020-01-01 (×2): qty 237

## 2020-01-01 MED ORDER — POTASSIUM CHLORIDE CRYS ER 20 MEQ PO TBCR
40.0000 meq | EXTENDED_RELEASE_TABLET | Freq: Two times a day (BID) | ORAL | Status: AC
  Administered 2020-01-01 (×2): 40 meq via ORAL
  Filled 2020-01-01 (×2): qty 2

## 2020-01-01 NOTE — Plan of Care (Signed)

## 2020-01-01 NOTE — Progress Notes (Signed)
   Subjective:  Patient reports pain as moderate.  No events.  Objective:   VITALS:   Vitals:   12/31/19 1940 12/31/19 1955 12/31/19 2010 01/01/20 0816  BP: (!) 124/49 (!) 106/50 (!) 110/53 (!) 113/56  Pulse: 83 82 81 82  Resp: 20 19 22 17   Temp:   (!) 97.3 F (36.3 C) (!) 97.5 F (36.4 C)  TempSrc:    Oral  SpO2: 100% 97% 96% 93%  Weight:      Height:        Neurovascular intact Sensation intact distally Intact pulses distally Dorsiflexion/Plantar flexion intact Incision: dressing C/D/I and no drainage   Lab Results  Component Value Date   WBC 8.4 01/01/2020   HGB 8.7 (L) 01/01/2020   HCT 27.7 (L) 01/01/2020   MCV 89.9 01/01/2020   PLT 261 01/01/2020     Assessment/Plan:  1 Day Post-Op   - WBAT LLE - platform WB LUE - up with PT/OT   Eduard Roux 01/01/2020, 1:35 PM [phone]

## 2020-01-01 NOTE — Telephone Encounter (Signed)
R/s appt per 7/23 sch msg - pt to get an updated schedule after discharge. Called number - no answer and no vmail

## 2020-01-01 NOTE — Progress Notes (Addendum)
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 [name], type II diabetes, brain aneurysm, recent right hip replacement in May 2021, stage III squamous cell lung cancer (follows with Dr. [name]) who presented to [facility] ED on 7/22 after a fall from ground-level after getting up from sitting position to go to the bathroom and fell sideways without LOC.  She had subsequent left hip and wrist pain.  ED Course: CT scan most notable for left femoral neck angulated comminuted fracture and acute styloid ulnar fracture with suspected chronic left radius fracture.  Wrist was splinted in the ED.  Orthopedic surgery was consulted as well as Dr. Claudia Desanctis, plastic surgery who recommended CT scan of left wrist to clarify fracture details and acuity.  Most normal lab Hb 8.1 slightly below baseline of 8.7.   Post left total hip arthroplasty, closed reduction left distal radius fracture on 12/31/2019 by Dr. [name].  01/01/20: Seen and examined with her daughter at bedside.  POD #1 post orthopedic surgery as stated above.  Her pain is well controlled.  Mild edema in her left lower extremity without tenderness or erythema.  She is eating breakfast, not in distress, comfortable.  She has no new complaints at this time.  She was seen by PT OT with recommendation for SNF.  TOC assisting with placement.  Assessment/Plan: Active Problems:   Fracture of femoral neck, left, closed (HCC)   Protein-calorie malnutrition, severe   Closed fracture of left distal radius   Closed fracture of left wrist  Left hip fracture and left wrist fracture secondary to mechanical fall from ground height s/p left total hip arthroplasty, closed reduction left distal radius fracture on 12/31/2019 by Dr. [name].  a. CT left wrist ordered  per plastic surgery: Acute impacted fracture of distal radial metaphysis with intra-articular extension to the radiocarpal joint with dominant dorsal fracture fragment of distal radius displaced dorsally and acute mildly displaced ulnar styloid fracture. b. Orthopedic and plastic surgery following, stable post surgery.   c. Pain control in place. d. PT recommended SNF. e. TOC assisting with SNF placement.  2. Acute on chronic hypoxic respiratory failure in setting of ILD and known stage III squamous cell lung cancer a. Suspect atelectasis, O2 saturation and requirement back to baseline b. On 2 L/min at baseline c. CXR with known LUL mass and chronic ILD noted d. Continue incentive spirometry  3. Chronic normocytic anemia likely from chronic illness a. Stable, hemoglobin 8.7.  Continue home iron  4. Stage III lung cancer a. Follows with Dr. Julien Nordmann b. Oncology notified  5. Prediabetes a. HA1C 5.8 on 12/31/2019 b. Continue sensitive sliding scale, and avoid hypoglycemia.  Hypothyroidism  last TSH 0.363 on 11/05/2019 Continue levothyroxine  Ambulatory dysfunction post orthopedic surgery Continue PT OT with recommendations from orthopedic surgery Plan is to discharge to SNF for rehab Continue fall precaution   DVT prophylaxis: SQ Lovenox daily Code Status: Full Code   Family Communication:  Updated her daughter at bedside.   Status is: Inpatient     Consultants   Orthopedic surgery  Plastic surgery  Procedures  Left total hip arthroplasty, closed reduction left distal radius fracture on 12/31/2019 by Dr. [name].   Status is: Inpatient    Dispo: The patient is from: Home  Anticipated d/c is to:SNF              Anticipated d/c date is:01/02/20              Patient currently not appropriate for dc at this time, awaiting SNF placement.    Objective: Vitals:   12/31/19 1940 12/31/19 1955 12/31/19 2010 01/01/20 0816  BP: (!) 124/49 (!) 106/50 (!)  110/53 (!) 113/56  Pulse: 83 82 81 82  Resp: 20 19 22 17   Temp:   (!) 97.3 F (36.3 C) (!) 97.5 F (36.4 C)  TempSrc:    Oral  SpO2: 100% 97% 96% 93%  Weight:      Height:        Intake/Output Summary (Last 24 hours) at 01/01/2020 1252 Last data filed at 12/31/2019 1904 Gross per 24 hour  Intake 1015 ml  Output 200 ml  Net 815 ml   Filed Weights   12/31/19 0316 12/31/19 1442  Weight: 60.3 kg 62.6 kg    Exam:  . General: 79 y.o. year-old female well developed well nourished in no acute distress.  Alert and oriented x3. . Cardiovascular: Regular rate and rhythm with no rubs or gallops.  No thyromegaly or JVD noted.   [name] Respiratory: Clear to auscultation with no wheezes or rales. Good inspiratory effort. . Abdomen: Soft nontender nondistended with normal bowel sounds x4 quadrants. . Musculoskeletal: Mild edema in left lower extremity edema. 2/4 pulses in all 4 extremities. . Skin: No ulcerative lesions noted or rashes. . Psychiatry: Mood is appropriate for condition and setting   Data Reviewed: CBC: Recent Labs  Lab 12/31/19 0330 01/01/20 0201  WBC 8.4 8.4  NEUTROABS 6.6  --   HGB 8.1* 8.7*  HCT 27.0* 27.7*  MCV 91.8 89.9  PLT 300 660   Basic Metabolic Panel: Recent Labs  Lab 12/31/19 0330 01/01/20 0201  NA 138 135  K 3.7 3.4*  CL 104 99  CO2 26 28  GLUCOSE 130* 196*  BUN 9 6*  CREATININE 0.50 0.43*  CALCIUM 9.4 9.6   GFR: Estimated Creatinine Clearance: 52.2 mL/min (A) (by C-G formula based on SCr of 0.43 mg/dL (L)). Liver Function Tests: Recent Labs  Lab 12/31/19 0330 01/01/20 0201  AST 30 20  ALT 9 13  ALKPHOS 58 53  BILITOT 0.8 0.5  PROT 6.7 6.0*  ALBUMIN 2.4* 1.9*   No results for input(s): LIPASE, AMYLASE in the last 168 hours. No results for input(s): AMMONIA in the last 168 hours. Coagulation Profile: Recent Labs  Lab 12/31/19 0330  INR 1.2   Cardiac Enzymes: No results for input(s): CKTOTAL, CKMB, CKMBINDEX, TROPONINI in the  last 168 hours. BNP (last 3 results) No results for input(s): PROBNP in the last 8760 hours. HbA1C: Recent Labs    12/31/19 1500  HGBA1C 5.8*   CBG: Recent Labs  Lab 12/31/19 1521 12/31/19 1916 12/31/19 2120 01/01/20 0935 01/01/20 1136  GLUCAP 112* 135* 113* 130* 120*   Lipid Profile: No results for input(s): CHOL, HDL, LDLCALC, TRIG, CHOLHDL, LDLDIRECT in the last 72 hours. Thyroid Function Tests: No results for input(s): TSH, T4TOTAL, FREET4, T3FREE, THYROIDAB in the last 72 hours. Anemia Panel: No results for input(s): VITAMINB12, FOLATE, FERRITIN, TIBC, IRON, RETICCTPCT in the last 72 hours. Urine analysis:    Component Value Date/Time   COLORURINE YELLOW 05/13/2018 2053   APPEARANCEUR CLEAR 05/13/2018 2053   LABSPEC 1.016 05/13/2018 2053   PHURINE 6.0 05/13/2018 2053   GLUCOSEU NEGATIVE 05/13/2018 2053  HGBUR SMALL (A) 05/13/2018 2053   BILIRUBINUR NEGATIVE 05/13/2018 2053   E. Lopez NEGATIVE 05/13/2018 2053   PROTEINUR 30 (A) 05/13/2018 2053   NITRITE NEGATIVE 05/13/2018 2053   LEUKOCYTESUR TRACE (A) 05/13/2018 2053   Sepsis Labs: @LABRCNTIP (procalcitonin:4,lacticidven:4)  ) Recent Results (from the past 240 hour(s))  SARS Coronavirus 2 by RT PCR (hospital order, performed in [facility] hospital lab) Nasopharyngeal Nasopharyngeal Swab     Status: None   Collection Time: 12/31/19  3:30 AM   Specimen: Nasopharyngeal Swab  Result Value Ref Range Status   SARS Coronavirus 2 NEGATIVE NEGATIVE Final    Comment: (NOTE) SARS-CoV-2 target nucleic acids are NOT DETECTED.  The SARS-CoV-2 RNA is generally detectable in upper and lower respiratory specimens during the acute phase of infection. The lowest concentration of SARS-CoV-2 viral copies this assay can detect is 250 copies / mL. A negative result does not preclude SARS-CoV-2 infection and should not be used as the sole basis for treatment or other patient management decisions.  A negative result may occur  with improper specimen collection / handling, submission of specimen other than nasopharyngeal swab, presence of viral mutation(s) within the areas targeted by this assay, and inadequate number of viral copies (<250 copies / mL). A negative result must be combined with clinical observations, patient history, and epidemiological information.  Fact Sheet for Patients:   StrictlyIdeas.no  Fact Sheet for Healthcare Providers: BankingDealers.co.za  This test is not yet approved or  cleared by the [place] FDA and has been authorized for detection and/or diagnosis of SARS-CoV-2 by FDA under an Emergency Use Authorization (EUA).  This EUA will remain in effect (meaning this test can be used) for the duration of the COVID-19 declaration under Section 564(b)(1) of the Act, 21 U.S.C. section 360bbb-3(b)(1), unless the authorization is terminated or revoked sooner.  Performed at [facility], [place] [address] [place], [place] [address]       Studies: DG Wrist 2 Views Left  Result Date: 12/31/2019 CLINICAL DATA:  Close reduction left wrist fracture EXAM: LEFT WRIST - 2 VIEW COMPARISON:  12/31/2019 at 4:36 a.m. FINDINGS: 2 fluoroscopic images are obtained during the performance of the procedure and are provided for interpretation only. There is been reduction of the impacted distal left radial fracture seen previously, now with near anatomic alignment. Displaced ulnar styloid fracture unchanged. Please refer to the operative report. FLUOROSCOPY TIME:  4 seconds, 2 images IMPRESSION: 1. Reduction of the impacted distal left radial fracture with near anatomic alignment. 2. Stable ulnar styloid fracture. Electronically Signed   By: Randa Ngo M.D.   On: 12/31/2019 19:55   CT WRIST LEFT WO CONTRAST  Result Date: 12/31/2019 CLINICAL DATA:  Evaluate left wrist fracture EXAM: CT OF THE LEFT WRIST WITHOUT CONTRAST TECHNIQUE:  Multidetector CT imaging was performed according to the standard protocol. Multiplanar CT image reconstructions were also generated. COMPARISON:  X-ray 12/31/2019 FINDINGS: Technical note: Examination is degraded by beam hardening artifact related to patient positioning of the wrist overlying the abdomen. Bones/Joint/Cartilage Acute impacted fracture of the distal radial metaphysis with intra-articular extension to the radiocarpal joint. Approximately 6 mm of impaction. Large dorsal fracture fragment of the distal radius is displaced dorsally up to 5 mm resulting in significant radiocarpal articular surface diastasis (series 4, image 21). Acute mildly displaced ulnar styloid fracture (series 5, image 22) Carpal bones appear intact without fracture or malalignment. Ligaments Suboptimally assessed by CT. Muscles and Tendons Suboptimally evaluated given the degree of beam  hardening. Soft tissues Diffuse soft tissue swelling the level of the wrist. No well-defined fluid collection. IMPRESSION: 1. Acute impacted fracture of the distal radial metaphysis with intra-articular extension to the radiocarpal joint. 2. Dominant dorsal fracture fragment of the distal radius is displaced dorsally up to 5 mm with resultant radiocarpal articular surface diastasis. 3. Acute mildly displaced ulnar styloid fracture. Electronically Signed   By: Davina Poke D.O.   On: 12/31/2019 13:23   Pelvis Portable  Result Date: 12/31/2019 CLINICAL DATA:  Left hip arthroplasty, left hip pain and immobility EXAM: PORTABLE PELVIS 1-2 VIEWS COMPARISON:  12/31/2019 FINDINGS: Supine frontal view of the lower pelvis and bilateral hips was performed, the iliac crests and sacrum are excluded by collimation. Chronic right hip arthroplasty remains in anatomic alignment. The left hip arthroplasty is unchanged in position from intraoperative exam performed earlier. Postsurgical changes are seen within the soft tissues surrounding the left hip. There are  no acute bony abnormalities. IMPRESSION: 1. Unremarkable bilateral hip arthroplasties as above. Electronically Signed   By: Randa Ngo M.D.   On: 12/31/2019 20:35   DG Knee Left Port  Result Date: 12/31/2019 CLINICAL DATA:  Pain EXAM: PORTABLE LEFT KNEE - 1-2 VIEW COMPARISON:  None. FINDINGS: There is no acute fracture or dislocation. Osseous mineralization is decreased. There are tricompartmental changes of osteoarthritis, greatest in the patellofemoral compartment. No joint effusion. IMPRESSION: No acute osseous abnormality or joint effusion.  Osteoarthritis. Electronically Signed   By: Macy Mis M.D.   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 There are expected postsurgical changes. The alignment is near anatomic. There is no evidence for a periprosthetic fracture. IMPRESSION: Status post total hip arthroplasty on the left. Electronically Signed   By: Constance Holster M.D.   On: 12/31/2019 19:57   DG HIP OPERATIVE UNILAT W OR W/O PELVIS LEFT  Result Date: 12/31/2019 CLINICAL DATA:  Hip replacement EXAM: OPERATIVE LEFT HIP (WITH PELVIS IF PERFORMED) 3 VIEWS TECHNIQUE: Fluoroscopic spot image(s) were submitted for interpretation post-operatively. 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 There are expected postsurgical changes. The alignment is near anatomic. There is no evidence for a periprosthetic fracture. IMPRESSION: Status post total hip arthroplasty on the left. Electronically Signed   By: Constance Holster M.D.   On: 12/31/2019 19:56    Scheduled Meds: . sodium chloride   Intravenous Once    . acetaminophen  500 mg Oral Q6H  . bupivacaine liposome  20 mL Infiltration Once  . docusate sodium  100 mg Oral BID  . enoxaparin (LOVENOX) injection  40 mg Subcutaneous Q24H  . ferrous sulfate  325 mg Oral Q breakfast  . insulin aspart  0-9 Units Subcutaneous TID WC  . levothyroxine  88 mcg Oral QAC breakfast  . potassium chloride  40 mEq Oral BID  . pravastatin  40 mg Oral Daily  . vortioxetine HBr  10 mg Oral Daily    Continuous Infusions: . sodium chloride 75 mL/hr at 01/01/20 0541  . methocarbamol (ROBAXIN) IV       LOS: 1 day     Kayleen Memos, MD [facility] Pager [phone]  If 7PM-7AM, please contact night-coverage [email or web address] Password [facility] 01/01/2020, 12:52 PM

## 2020-01-01 NOTE — TOC CAGE-AID Note (Signed)
Transition of Care [facility]) - CAGE-AID Screening   Patient Details  Name: [name] MRN: [id number] Date of Birth: [birth year]  [facility] CM/SW Contact:    Emeterio Reeve, [place] Phone Number: 01/01/2020, 3:15 PM   Clinical Narrative:  CSW met with pt at bedside. CSW introduced self and explained her role at the hospital.  Pt reported she does not drink alcohol or substance use.   CAGE-AID Screening:    Have You Ever Felt You Ought to Cut Down on Your Drinking or Drug Use?: No Have People Annoyed You By Critizing Your Drinking Or Drug Use?: No Have You Felt Bad Or Guilty About Your Drinking Or Drug Use?: No Have You Ever Had a Drink or Used Drugs First Thing In The Morning to Steady Your Nerves or to Get Rid of a Hangover?: No CAGE-AID Score: 0  Substance Abuse Education Offered: Yes  Substance abuse interventions: Patient Counseling  Emeterio Reeve, Latanya Presser, [place] Social Worker [phone]

## 2020-01-01 NOTE — Evaluation (Signed)
Occupational Therapy Evaluation Patient Details Name: [name] MRN: [id number] DOB: [birth year] Today's Date: 01/01/2020    History of Present Illness Pt is a 79 year old woman admitted after fall resulting in L distal radius and L hip fractures on 12/31/19. Pt underwent ORIF of L wrist and L THA, anterior approach on the day of admission.  PMH: R anterior THA May 2021 in which pt went to rehab in SNF, lung ca, DM, CAD, CHF,  brain aneurysm, anxiety, depression. Pt uses 2L 02 at baseline.   Clinical Impression   Pt was ambulating with a RW and assisted for ADL and IADL prior to admission. Presents with generalized weakness, L hip pain, impaired cognition and decreased standing balance. She requires +2 assist for all mobility and set up to total assist for ADL. Recommending SNF for further rehab. Per daughter, pt was supposed to begin cancer treatment in the near future. The family is still considering whether they can coordinate care in a home setting Will follow acutely.    Follow Up Recommendations  SNF;Supervision/Assistance - 24 hour    Equipment Recommendations  None recommended by OT    Recommendations for Other Services       Precautions / Restrictions Precautions Precautions: Fall Required Braces or Orthoses: Sling Restrictions Weight Bearing Restrictions: Yes LUE Weight Bearing: Weight bear through elbow only (may use platform walker) LLE Weight Bearing: Weight bearing as tolerated      Mobility Bed Mobility Overal bed mobility: Needs Assistance Bed Mobility: Supine to Sit     Supine to sit: +2 for physical assistance;Max assist     General bed mobility comments: increased time, cues to sequence, assist for LEs over EOB, to raise trunk and advance hip to EOB with bed pad  Transfers Overall transfer level: Needs assistance Equipment used: Left platform walker Transfers: Sit to/from Stand;Stand Pivot Transfers Sit to Stand: +2 physical assistance;Min assist;Mod  assist;From elevated surface Stand pivot transfers: +2 physical assistance;Mod assist       General transfer comment: assist to rise and steady, for placement of L UE on/off platform and to guide walker    Balance Overall balance assessment: Needs assistance Sitting-balance support: Feet supported Sitting balance-Leahy Scale: Fair Sitting balance - Comments: posterior lean, but no overt LOB   Standing balance support: Bilateral upper extremity supported Standing balance-Leahy Scale: Poor Standing balance comment: requires B UE and external support                           ADL either performed or assessed with clinical judgement   ADL Overall ADL's : Needs assistance/impaired Eating/Feeding: Set up;Sitting   Grooming: Maximal assistance;Sitting (to put her hair in pony tail)   Upper Body Bathing: Moderate assistance;Sitting   Lower Body Bathing: Total assistance;+2 for physical assistance;Sit to/from stand   Upper Body Dressing : Maximal assistance;Sitting   Lower Body Dressing: Total assistance;Bed level   Toilet Transfer: +2 for physical assistance;Moderate assistance;Stand-pivot;BSC;RW   Toileting- Clothing Manipulation and Hygiene: Total assistance;+2 for physical assistance;Sit to/from stand       Functional mobility during ADLs: Moderate assistance;+2 for safety/equipment;+2 for physical assistance;Rolling walker       Vision Baseline Vision/History: Wears glasses Wears Glasses: Reading only Patient Visual Report: No change from baseline       Perception     Praxis      Pertinent Vitals/Pain Pain Assessment: Faces Pain Score: 4  Faces Pain Scale: Hurts little more  Pain Location: L hip Pain Descriptors / Indicators: Grimacing;Guarding Pain Intervention(s): Monitored during session;Repositioned     Hand Dominance Right   Extremity/Trunk Assessment Upper Extremity Assessment Upper Extremity Assessment: LUE deficits/detail LUE Deficits /  Details: splinted from elbow to MPs LUE: Unable to fully assess due to immobilization LUE Coordination: decreased fine motor   Lower Extremity Assessment Lower Extremity Assessment: Defer to PT evaluation       Communication Communication Communication: No difficulties   Cognition Arousal/Alertness: Awake/alert Behavior During Therapy: Anxious Overall Cognitive Status: Impaired/Different from baseline Area of Impairment: Attention;Following commands;Problem solving;Safety/judgement;Memory                   Current Attention Level: Sustained Memory: Decreased short-term memory Following Commands: Follows one step commands with increased time Safety/Judgement: Decreased awareness of safety;Decreased awareness of deficits   Problem Solving: Slow processing;Decreased initiation;Difficulty sequencing;Requires verbal cues;Requires tactile cues     General Comments       Exercises     Shoulder Instructions      Home Living Family/patient expects to be discharged to:: Unsure                                 Additional Comments: per daughter, family is in the process of making a plan for pt's discharge. Pt to begin cancer treatment soon.      Prior Functioning/Environment Level of Independence: Needs assistance  Gait / Transfers Assistance Needed: pt was ambulating with a RW ADL's / Homemaking Assistance Needed: assisted for ADL and IADL            OT Problem List: Decreased strength;Decreased activity tolerance;Impaired balance (sitting and/or standing);Decreased coordination;Decreased cognition;Decreased safety awareness;Decreased knowledge of use of DME or AE;Impaired UE functional use;Pain      OT Treatment/Interventions: Self-care/ADL training;DME and/or AE instruction;Therapeutic activities;Patient/family education;Balance training    OT Goals(Current goals can be found in the care plan section) Acute Rehab OT Goals Patient Stated Goal: pt  agreeable to working with therapies OT Goal Formulation: With patient Time For Goal Achievement: 01/15/20 Potential to Achieve Goals: Good ADL Goals Pt Will Perform Grooming: with min assist;sitting Pt Will Perform Upper Body Bathing: with min assist;sitting Pt Will Perform Upper Body Dressing: with min assist;sitting Pt Will Transfer to Toilet: with mod assist;ambulating;bedside commode Additional ADL Goal #1: Pt will perform bed mobility with moderate assistance in preparation for ADL.  OT Frequency: Min 2X/week   Barriers to D/C:            Co-evaluation PT/OT/SLP Co-Evaluation/Treatment: Yes Reason for Co-Treatment: For patient/therapist safety   OT goals addressed during session: ADL's and self-care;Proper use of Adaptive equipment and DME      AM-PAC OT "6 Clicks" Daily Activity     Outcome Measure Help from another person eating meals?: A Little Help from another person taking care of personal grooming?: A Lot Help from another person toileting, which includes using toliet, bedpan, or urinal?: Total Help from another person bathing (including washing, rinsing, drying)?: A Lot Help from another person to put on and taking off regular upper body clothing?: A Lot Help from another person to put on and taking off regular lower body clothing?: Total 6 Click Score: 11   End of Session Equipment Utilized During Treatment: Gait belt;Rolling walker;Oxygen  Activity Tolerance: Patient tolerated treatment well Patient left: in chair;with call bell/phone within reach;with chair alarm set;with family/visitor present  OT Visit Diagnosis:  Unsteadiness on feet (R26.81);Other abnormalities of gait and mobility (R26.89);Pain;Muscle weakness (generalized) (M62.81);Other symptoms and signs involving cognitive function                Time: 4128-2081 OT Time Calculation (min): 35 min Charges:  OT General Charges $OT Visit: 1 Visit OT Evaluation $OT Eval Moderate Complexity: 1  Mod  Nestor Lewandowsky, OTR/L Acute Rehabilitation Services Pager: [phone] Office: [phone]  [name] 01/01/2020, 10:58 AM

## 2020-01-01 NOTE — Progress Notes (Signed)
Called pt to introduce myself as her Arboriculturist and to discuss the [employer].  Unfortunately there aren't any foundations offering copay assistance for her Dx and the type of ins she has.  I left a msg requesting she return my call if she would like to apply for the grant.

## 2020-01-01 NOTE — NC FL2 (Signed)
[place] LEVEL OF CARE SCREENING TOOL     IDENTIFICATION  Patient Name: [name] Birthdate: 04-26-1941 Sex: female Admission Date (Current Location): 12/31/2019  [facility] and [place] Number:  Herbalist and Address:  [facility], [place] [address] [place], [place] [address]      Provider Number: [id number]  Attending Physician Name and Address:  Kayleen Memos, DO  Relative Name and Phone Number:  Evony Rezek - [phone]    Current Level of Care: Hospital Recommended Level of Care: [place] Prior Approval Number:    Date Approved/Denied:   PASRR Number: [id number]  Discharge Plan: SNF    Current Diagnoses: Patient Active Problem List   Diagnosis Date Noted   Fracture of femoral neck, left, closed ([place]) 12/31/2019   Protein-calorie malnutrition, severe 12/31/2019   Closed fracture of left distal radius 12/31/2019   Closed fracture of left wrist    Goals of care, counseling/discussion 12/24/2019   Stage III squamous cell carcinoma of left lung ([place]) 12/01/2019   Encounter for antineoplastic chemotherapy 12/01/2019   Fall at home    Unwitnessed fall 11/04/2019   Mediastinal adenopathy 11/04/2019   Hypothyroidism 11/04/2019   Fracture of femoral neck, right ([place]) 11/03/2019   Essential hypertension 07/20/2018   Hypercholesterolemia 07/20/2018   Type 2 diabetes mellitus with hyperlipidemia ([place]) 07/20/2018   Coronary artery calcification seen on CAT scan 07/20/2018   Mass of left lung 05/22/2018   Hypokalemia 05/13/2018   Hypoxemia 05/13/2018   Diabetes mellitus without complication ([place]) 03/27/5101   Environmental allergies 02/07/2015   IPF (idiopathic pulmonary fibrosis) ([place]) 07/15/2014   Chronic cough 02/11/2014   ILD (interstitial lung disease) ([place]) 11/18/2013   Chronic rhinitis 10/02/2010   GERD (gastroesophageal reflux disease) 10/02/2010   INTERSTITIAL LUNG  DISEASE 07/10/2010   DEPRESSION/ANXIETY 04/12/2008   Irritable bowel syndrome 10/09/2007   Mass of anus 10/09/2007   DIARRHEA, CHRONIC 10/09/2007    Orientation RESPIRATION BLADDER Height & Weight     Self, Time, Situation, Place  O2 (Baseline Home O2 at 2L/min) Continent Weight: 62.6 kg Height:  5\' 5"  (165.1 cm)  BEHAVIORAL SYMPTOMS/MOOD NEUROLOGICAL BOWEL NUTRITION STATUS      Continent Diet (See discharge summary)  AMBULATORY STATUS COMMUNICATION OF NEEDS Skin   Extensive Assist Verbally Surgical wounds, Wound Vac                       Personal Care Assistance Level of Assistance  Bathing, Dressing Bathing Assistance: Limited assistance   Dressing Assistance: Limited assistance     Functional Limitations Info  Sight, Hearing, Speech Sight Info: Adequate Hearing Info: Adequate Speech Info: Adequate    SPECIAL CARE FACTORS FREQUENCY  PT (By licensed PT), OT (By licensed OT)     PT Frequency: 5 x per week OT Frequency: 5 x per week            Contractures Contractures Info: Not present    Additional Factors Info  Code Status, Allergies, Insulin Sliding Scale Code Status Info: Full code Allergies Info: penicillin, influenza, clarithromycin, cortisone, iodine   Insulin Sliding Scale Info: See discharge summary       Current Medications (01/01/2020):  This is the current hospital active medication list Current Facility-Administered Medications  Medication Dose Route Frequency Provider Last Rate Last Admin   0.9 %  sodium chloride infusion (Manually program via Guardrails IV Fluids)   Intravenous Once Cockfield, Tommy Rainwater.,  CRNA       0.9 %  sodium chloride infusion   Intravenous Continuous Leandrew Koyanagi, MD 75 mL/hr at 01/01/20 0541 New Bag at 01/01/20 0541   acetaminophen (TYLENOL) tablet 325-650 mg  325-650 mg Oral Q6H PRN Leandrew Koyanagi, MD       acetaminophen (TYLENOL) tablet 500 mg  500 mg Oral Q6H Leandrew Koyanagi, MD   500 mg at 01/01/20  1220   alum & mag hydroxide-simeth (MAALOX/MYLANTA) 200-200-20 MG/5ML suspension 30 mL  30 mL Oral Q4H PRN Leandrew Koyanagi, MD       bupivacaine liposome (EXPAREL) 1.3 % injection 266 mg  20 mL Infiltration Once Leandrew Koyanagi, MD       docusate sodium (COLACE) capsule 100 mg  100 mg Oral BID Leandrew Koyanagi, MD   100 mg at 01/01/20 1002   enoxaparin (LOVENOX) injection 40 mg  40 mg Subcutaneous Q24H Leandrew Koyanagi, MD       feeding supplement (GLUCERNA SHAKE) (GLUCERNA SHAKE) liquid 237 mL  237 mL Oral TID BM Hall, Carole N, DO       ferrous sulfate tablet 325 mg  325 mg Oral Q breakfast Harold Hedge, MD   325 mg at 01/01/20 1002   HYDROcodone-acetaminophen (NORCO) 7.5-325 MG per tablet 1-2 tablet  1-2 tablet Oral Q4H PRN Leandrew Koyanagi, MD       HYDROcodone-acetaminophen (NORCO/VICODIN) 5-325 MG per tablet 1-2 tablet  1-2 tablet Oral Q6H PRN Harold Hedge, MD   1 tablet at 12/31/19 2315   HYDROcodone-acetaminophen (NORCO/VICODIN) 5-325 MG per tablet 1-2 tablet  1-2 tablet Oral Q4H PRN Leandrew Koyanagi, MD       insulin aspart (novoLOG) injection 0-9 Units  0-9 Units Subcutaneous TID WC Leandrew Koyanagi, MD   1 Units at 01/01/20 1002   levothyroxine (SYNTHROID) tablet 88 mcg  88 mcg Oral QAC breakfast Harold Hedge, MD   88 mcg at 01/01/20 0541   magnesium citrate solution 1 Bottle  1 Bottle Oral Once PRN Leandrew Koyanagi, MD       menthol-cetylpyridinium (CEPACOL) lozenge 3 mg  1 lozenge Oral PRN Leandrew Koyanagi, MD       Or   phenol (CHLORASEPTIC) mouth spray 1 spray  1 spray Mouth/Throat PRN Leandrew Koyanagi, MD       methocarbamol (ROBAXIN) tablet 500 mg  500 mg Oral Q6H PRN Leandrew Koyanagi, MD   500 mg at 01/01/20 1220   Or   methocarbamol (ROBAXIN) 500 mg in dextrose 5 % 50 mL IVPB  500 mg Intravenous Q6H PRN Leandrew Koyanagi, MD       morphine 2 MG/ML injection 0.5 mg  0.5 mg Intravenous Q2H PRN Harold Hedge, MD       morphine 2 MG/ML injection 1 mg  1 mg Intravenous Q2H PRN Leandrew Koyanagi,  MD       ondansetron [facility]) tablet 4 mg  4 mg Oral Q6H PRN Leandrew Koyanagi, MD       Or   ondansetron [facility]) injection 4 mg  4 mg Intravenous Q6H PRN Leandrew Koyanagi, MD       polyethylene glycol (MIRALAX / GLYCOLAX) packet 17 g  17 g Oral Daily PRN Leandrew Koyanagi, MD       potassium chloride SA (KLOR-CON) CR tablet 40 mEq  40 mEq Oral BID Irene Pap N, DO   40 mEq at 01/01/20  1002   pravastatin (PRAVACHOL) tablet 40 mg  40 mg Oral Daily Harold Hedge, MD   40 mg at 01/01/20 1001   [START ON 01/02/2020] protein supplement (ENSURE MAX) liquid  11 oz Oral Daily Hall, Carole N, DO       senna-docusate (Senokot-S) tablet 1 tablet  1 tablet Oral QHS PRN Harold Hedge, MD       sorbitol 70 % solution 30 mL  30 mL Oral Daily PRN Leandrew Koyanagi, MD       vortioxetine HBr (TRINTELLIX) tablet 10 mg  10 mg Oral Daily Harold Hedge, MD   10 mg at 01/01/20 1001     Discharge Medications: Please see discharge summary for a list of discharge medications.  Relevant Imaging Results:  Relevant Lab Results:   Additional Information SS#[id number]  [place], [place]

## 2020-01-01 NOTE — Evaluation (Signed)
Physical Therapy Evaluation Patient Details Name: [name] MRN: [id number] DOB: 06/06/1941 Today's Date: 01/01/2020   History of Present Illness  Pt is a 79 year old woman admitted after fall resulting in L distal radius and L hip fractures on 12/31/19. Pt underwent ORIF of L wrist and L THA, anterior approach on the day of admission.  PMH: R anterior THA May 2021 in which pt went to rehab in SNF, lung ca, DM, CAD, CHF,  brain aneurysm, anxiety, depression. Pt uses 2L 02 at baseline.  Clinical Impression  Prior to admission, pt was ambulating with a walker and required assist with ADL's. Presents with decreased functional mobility secondary to weakness, L hip pain, cognitive impairments, balance deficits. Requiring two person assist for mobility and basic transfers. Right knee buckle noted in stance. Per daughter, discharge plan is undecided currently due to impending cancer treatment at [facility]. Currently recommending SNF at discharge to maximize functional mobility and decrease caregiver burden.     Follow Up Recommendations SNF    Equipment Recommendations  None recommended by PT    Recommendations for Other Services       Precautions / Restrictions Precautions Precautions: Fall Required Braces or Orthoses: Sling Restrictions Weight Bearing Restrictions: Yes LUE Weight Bearing: Weight bear through elbow only (may use platform RW) LLE Weight Bearing: Weight bearing as tolerated      Mobility  Bed Mobility Overal bed mobility: Needs Assistance Bed Mobility: Supine to Sit     Supine to sit: +2 for physical assistance;Max assist     General bed mobility comments: increased time, cues to sequence, assist for LEs over EOB, to raise trunk and advance hip to EOB with bed pad  Transfers Overall transfer level: Needs assistance Equipment used: Left platform walker Transfers: Sit to/from Stand;Stand Pivot Transfers Sit to Stand: +2 physical assistance;Min assist;Mod assist;From  elevated surface Stand pivot transfers: +2 physical assistance;Mod assist       General transfer comment: assist to rise and steady, for placement of L UE on/off platform and to guide walker  Ambulation/Gait                Stairs            Wheelchair Mobility    Modified Rankin (Stroke Patients Only)       Balance Overall balance assessment: Needs assistance Sitting-balance support: Feet supported Sitting balance-Leahy Scale: Fair Sitting balance - Comments: posterior lean, but no overt LOB   Standing balance support: Bilateral upper extremity supported Standing balance-Leahy Scale: Poor Standing balance comment: requires B UE and external support                             Pertinent Vitals/Pain Pain Assessment: Faces Pain Score: 4  Faces Pain Scale: Hurts little more Pain Location: L hip Pain Descriptors / Indicators: Grimacing;Guarding Pain Intervention(s): Monitored during session;Repositioned    Home Living Family/patient expects to be discharged to:: Unsure                 Additional Comments: per daughter, family is in the process of making a plan for pt's discharge. Pt to begin cancer treatment soon.    Prior Function Level of Independence: Needs assistance   Gait / Transfers Assistance Needed: pt was ambulating with a RW  ADL's / Homemaking Assistance Needed: assisted for ADL and IADL        Hand Dominance   Dominant Hand: Right  Extremity/Trunk Assessment   Upper Extremity Assessment Upper Extremity Assessment: LUE deficits/detail LUE Deficits / Details: splinted from elbow to MPs LUE: Unable to fully assess due to immobilization LUE Coordination: decreased fine motor    Lower Extremity Assessment Lower Extremity Assessment: Generalized weakness;RLE deficits/detail;LLE deficits/detail RLE Deficits / Details: Recent THA, right knee buckle in standing LLE Deficits / Details: s/p THA       Communication    Communication: No difficulties  Cognition Arousal/Alertness: Awake/alert Behavior During Therapy: Anxious Overall Cognitive Status: Impaired/Different from baseline Area of Impairment: Attention;Following commands;Problem solving;Safety/judgement;Memory                   Current Attention Level: Sustained Memory: Decreased short-term memory Following Commands: Follows one step commands with increased time Safety/Judgement: Decreased awareness of safety;Decreased awareness of deficits   Problem Solving: Slow processing;Decreased initiation;Difficulty sequencing;Requires verbal cues;Requires tactile cues        General Comments      Exercises     Assessment/Plan    PT Assessment Patient needs continued PT services  PT Problem List Decreased strength;Decreased activity tolerance;Decreased mobility;Decreased balance;Decreased cognition;Decreased safety awareness;Pain       PT Treatment Interventions DME instruction;Gait training;Functional mobility training;Therapeutic activities;Therapeutic exercise;Balance training;Patient/family education    PT Goals (Current goals can be found in the Care Plan section)  Acute Rehab PT Goals Patient Stated Goal: pt agreeable to working with therapies PT Goal Formulation: With patient Time For Goal Achievement: 01/15/20 Potential to Achieve Goals: Good    Frequency Min 5X/week   Barriers to discharge        Co-evaluation PT/OT/SLP Co-Evaluation/Treatment: Yes Reason for Co-Treatment: For patient/therapist safety;To address functional/ADL transfers PT goals addressed during session: Mobility/safety with mobility OT goals addressed during session: ADL's and self-care;Proper use of Adaptive equipment and DME       AM-PAC PT "6 Clicks" Mobility  Outcome Measure Help needed turning from your back to your side while in a flat bed without using bedrails?: A Lot Help needed moving from lying on your back to sitting on the side of a  flat bed without using bedrails?: Total Help needed moving to and from a bed to a chair (including a wheelchair)?: A Lot Help needed standing up from a chair using your arms (e.g., wheelchair or bedside chair)?: A Little Help needed to walk in hospital room?: A Lot Help needed climbing 3-5 steps with a railing? : A Lot 6 Click Score: 12    End of Session Equipment Utilized During Treatment: Gait belt;Other (comment) (sling) Activity Tolerance: Patient tolerated treatment well Patient left: in chair;with call bell/phone within reach;with chair alarm set;with family/visitor present Nurse Communication: Mobility status PT Visit Diagnosis: Pain;Difficulty in walking, not elsewhere classified (R26.2);History of falling (Z91.81);Muscle weakness (generalized) (M62.81);Unsteadiness on feet (R26.81) Pain - Right/Left: Left Pain - part of body: Hip    Time: 7062-3762 PT Time Calculation (min) (ACUTE ONLY): 29 min   Charges:   PT Evaluation $PT Eval Moderate Complexity: 1 Mod            Wyona Almas, PT, DPT Acute Rehabilitation Services Pager [phone] Office [phone]   Deno Etienne 01/01/2020, 1:28 PM

## 2020-01-01 NOTE — Plan of Care (Signed)
  Problem: Safety: Goal: Ability to remain free from injury will improve Outcome: Progressing   

## 2020-01-01 NOTE — TOC Initial Note (Signed)
Transition of Care [facility]) - Initial/Assessment Note    Patient Details  Name: [name] MRN: [id number] Date of Birth: 1940-11-29  Transition of [facility]) CM/SW Contact:    Curlene Labrum, RN Phone Number: 01/01/2020, 4:30 PM  Clinical Narrative:                 Case Management met with the patient regarding transitions to home.  Patient states that she lives alone now in [place] and fell at home.  She was recently diagnosed with lung CA - and will need cancer treatments in [place] soon.  She is a Left distal radius repair and Left hip repair from 7/22 and will need SNf placement in [place] or [place] near [place] in case she is in need of CA treatments in [place].  Patient has had both Phizer vaccines.  She recently stayed at [facility] and may possibly want to return depending on which location she needs to go for SNF placement.  Family and patient have not decided for SNF placement after being offer Medicare choice.  Expected Discharge Plan: [place] Barriers to Discharge: Continued Medical Work up, Ship broker   Patient Goals and CMS Choice Patient states their goals for this hospitalization and ongoing recovery are:: Plans to go to short term rehab possibly close to [place]. CMS Medicare.gov Compare Post Acute Care list provided to:: Patient Choice offered to / list presented to : Patient  Expected Discharge Plan and Services Expected Discharge Plan: [place]   Discharge Planning Services: CM Consult Post Acute Care Choice: [place] Living arrangements for the past 2 months: Single Family Home Expected Discharge Date:  (unknown)               DME Arranged:  (Home O2 at 2L/min, WC, RW, 3:1)           HH Agency:  (Active with [facility] in [place])        Prior Living Arrangements/Services Living arrangements for the past 2 months: [place] with:: Self Patient language and  need for interpreter reviewed:: Yes Do you feel safe going back to the place where you live?: No   lives alone and fell  Need for Family Participation in Patient Care: Yes (Comment) Care giver support system in place?: Yes (comment) Current home services: Home PT (receives [facility] services through [facility] in [place], [place]) Criminal Activity/Legal Involvement Pertinent to Current Situation/Hospitalization: No - Comment as needed  Activities of Daily Living Home Assistive Devices/Equipment: Eyeglasses, Dentures (specify type), Walker (specify type), Built-in shower seat (3 wheeled walker, front wheeled walker, upper denture) ADL Screening (condition at time of admission) Patient's cognitive ability adequate to safely complete daily activities?: Yes Is the patient deaf or have difficulty hearing?: No Does the patient have difficulty seeing, even when wearing glasses/contacts?: Yes Does the patient have difficulty concentrating, remembering, or making decisions?: No Patient able to express need for assistance with ADLs?: Yes Does the patient have difficulty dressing or bathing?: Yes Independently performs ADLs?: No Communication: Independent Dressing (OT): Needs assistance Is this a change from baseline?: Change from baseline, expected to last >3 days Grooming: Needs assistance Is this a change from baseline?: Change from baseline, expected to last >3 days Feeding: Needs assistance Is this a change from baseline?: Change from baseline, expected to last >3 days Bathing: Needs assistance Is this a change from baseline?: Change from baseline, expected to last >3 days Toileting: Dependent Is this a change  from baseline?: Change from baseline, expected to last >3days In/Out Bed: Dependent Is this a change from baseline?: Change from baseline, expected to last >3 days Walks in Home: Dependent Is this a change from baseline?: Change from baseline, expected to last >3 days Does the patient have  difficulty walking or climbing stairs?: Yes (secondary to weakness) Weakness of Legs: Right Weakness of Arms/Hands: Left  Permission Sought/Granted Permission sought to share information with : Case Manager Permission granted to share information with : Yes, Verbal Permission Granted        Permission granted to share info w Relationship: daughter - Tye [place], son- Adam     Emotional Assessment Appearance:: Appears stated age Attitude/Demeanor/Rapport: Gracious Affect (typically observed): Accepting Orientation: : Oriented to Self, Oriented to Place, Oriented to  Time, Oriented to Situation Alcohol / Substance Use: Not Applicable Psych Involvement: No (comment)  Admission diagnosis:  Fall [W19.XXXA] Fracture of femoral neck, left, closed ([place]) [S72.002A] Closed fracture of left hip, initial encounter ([place]) [S72.002A] Fall, initial encounter [W19.XXXA] Closed fracture of left wrist, initial encounter [S62.102A] Malignant neoplasm of left lung, unspecified part of lung (HCC) [C34.92] Iron deficiency anemia, unspecified iron deficiency anemia type [D50.9] Patient Active Problem List   Diagnosis Date Noted  . Fracture of femoral neck, left, closed ([place]) 12/31/2019  . Protein-calorie malnutrition, severe 12/31/2019  . Closed fracture of left distal radius 12/31/2019  . Closed fracture of left wrist   . Goals of care, counseling/discussion 12/24/2019  . Stage III squamous cell carcinoma of left lung ([place]) 12/01/2019  . Encounter for antineoplastic chemotherapy 12/01/2019  . Fall at home   . Unwitnessed fall 11/04/2019  . Mediastinal adenopathy 11/04/2019  . Hypothyroidism 11/04/2019  . Fracture of femoral neck, right ([place]) 11/03/2019  . Essential hypertension 07/20/2018  . Hypercholesterolemia 07/20/2018  . Type 2 diabetes mellitus with hyperlipidemia ([place]) 07/20/2018  . Coronary artery calcification seen on CAT scan 07/20/2018  . Mass of left lung 05/22/2018  . Hypokalemia  05/13/2018  . Hypoxemia 05/13/2018  . Diabetes mellitus without complication ([place]) [id number]/[id number]  . Environmental allergies 02/07/2015  . IPF (idiopathic pulmonary fibrosis) ([place]) 07/15/2014  . Chronic cough 02/11/2014  . ILD (interstitial lung disease) ([place]) 11/18/2013  . Chronic rhinitis 10/02/2010  . GERD (gastroesophageal reflux disease) 10/02/2010  . INTERSTITIAL LUNG DISEASE 07/10/2010  . DEPRESSION/ANXIETY 04/12/2008  . Irritable bowel syndrome 10/09/2007  . Mass of anus 10/09/2007  . DIARRHEA, CHRONIC 10/09/2007   PCP:  [name], MD Pharmacy[facility]   [name]: [phone]: [phone]    Social Determinants of Health (SDOH) Interventions    Readmission Risk Interventions Readmission Risk Prevention Plan 01/01/2020  Transportation Screening Complete  PCP or Specialist Appt within 3-5 Days Complete  HRI or HWinsideComplete  Social Work Consult for [place]/Counseling Complete  Palliative Care Screening Complete  Medication Review (Press photographer Complete  Some recent data might be hidden

## 2020-01-02 ENCOUNTER — Inpatient Hospital Stay (HOSPITAL_COMMUNITY): Payer: Medicare Other

## 2020-01-02 DIAGNOSIS — S52502A Unspecified fracture of the lower end of left radius, initial encounter for closed fracture: Secondary | ICD-10-CM | POA: Diagnosis not present

## 2020-01-02 LAB — BASIC METABOLIC PANEL
Anion gap: 6 (ref 5–15)
BUN: 6 mg/dL — ABNORMAL LOW (ref 8–23)
CO2: 29 mmol/L (ref 22–32)
Calcium: 8.8 mg/dL — ABNORMAL LOW (ref 8.9–10.3)
Chloride: 98 mmol/L (ref 98–111)
Creatinine, Ser: 0.53 mg/dL (ref 0.44–1.00)
GFR calc Af Amer: >60 mL/min (ref >60)
GFR calc non Af Amer: >60 mL/min (ref >60)
Glucose, Bld: 186 mg/dL — ABNORMAL HIGH (ref 70–99)
Potassium: 3.8 mmol/L (ref 3.5–5.1)
Sodium: 133 mmol/L — ABNORMAL LOW (ref 135–145)

## 2020-01-02 LAB — CBC
HCT: 28.7 % — ABNORMAL LOW (ref 36.0–46.0)
Hemoglobin: 8.9 g/dL — ABNORMAL LOW (ref 12.0–15.0)
MCH: 28 pg (ref 26.0–34.0)
MCHC: 31 g/dL (ref 30.0–36.0)
MCV: 90.3 fL (ref 80.0–100.0)
Platelets: 260 10*3/uL (ref 150–400)
RBC: 3.18 MIL/uL — ABNORMAL LOW (ref 3.87–5.11)
RDW: 15.4 % (ref 11.5–15.5)
WBC: 9.3 10*3/uL (ref 4.0–10.5)
nRBC: 0 % (ref 0.0–0.2)

## 2020-01-02 LAB — GLUCOSE, CAPILLARY
Glucose-Capillary: 124 mg/dL — ABNORMAL HIGH (ref 70–99)
Glucose-Capillary: 122 mg/dL — ABNORMAL HIGH (ref 70–99)
Glucose-Capillary: 143 mg/dL — ABNORMAL HIGH (ref 70–99)
Glucose-Capillary: 138 mg/dL — ABNORMAL HIGH (ref 70–99)

## 2020-01-02 MED ORDER — ENSURE MAX PROTEIN PO LIQD: 11.0000 [oz_av] | Freq: Every day | ORAL | 0 refills | Status: AC

## 2020-01-02 MED ORDER — GLUCERNA SHAKE PO LIQD: 237.0000 mL | Freq: Three times a day (TID) | ORAL | 0 refills | Status: AC

## 2020-01-02 MED ORDER — FERROUS SULFATE 325 (65 FE) MG PO TABS: 325.0000 mg | ORAL_TABLET | Freq: Every day | ORAL | 0 refills | Status: AC

## 2020-01-02 NOTE — Progress Notes (Signed)
PROGRESS NOTE  [name] OPF:[id number] DOB: [birth year] DOA: 12/31/2019 PCP: [name], MD  HPI/Recap of past 24 hours: This is a 79 year old female with past medical history of chronic hypoxic respiratory failure with ILD on 2 L at baseline follows with Dr. [name], type II diabetes, brain aneurysm, recent right hip replacement in May 2021, stage III squamous cell lung cancer (follows with Dr. [name]) who presented to [facility] ED on 7/22 after a fall from ground-level after getting up from sitting position to go to the bathroom and fell sideways without LOC.  She had subsequent left hip and wrist pain.  ED Course: CT scan most notable for left femoral neck angulated comminuted fracture and acute styloid ulnar fracture with suspected chronic left radius fracture.  Wrist was splinted in the ED.  Orthopedic surgery was consulted as well as Dr. Claudia Desanctis, plastic surgery who recommended CT scan of left wrist to clarify fracture details and acuity.  Most normal lab Hb 8.1 slightly below baseline of 8.7.   Post left total hip arthroplasty, closed reduction left distal radius fracture on 12/31/2019 by Dr. [name].  01/02/20: Seen and examined.  Left hip pain is well controlled, 2 out of 10.  States she has a difficult time weightbearing on her right lower extremity.  TOC working on SNF placement.    Assessment/Plan: Active Problems:   Fracture of femoral neck, left, closed (HCC)   Protein-calorie malnutrition, severe   Closed fracture of left distal radius   Closed fracture of left wrist  Left hip fracture and left wrist fracture secondary to mechanical fall from ground height s/p left total hip arthroplasty, closed reduction left distal radius fracture on 12/31/2019 by Dr. [name].  a. CT left wrist ordered per plastic surgery: Acute impacted fracture of distal radial metaphysis with intra-articular extension to the radiocarpal joint with dominant dorsal fracture fragment of distal radius displaced  dorsally and acute mildly displaced ulnar styloid fracture. b. Orthopedic and plastic surgery following, stable post surgery.   c. Pain control in place. d. PT recommended SNF. e. TOC assisting with SNF placement.  2. Acute, resolving, on chronic hypoxic respiratory failure in setting of ILD and known stage III squamous cell lung cancer a. Suspect from atelectasis, O2 saturation and requirement back to baseline b. On 2 L/min at baseline c. CXR with known LUL mass and chronic ILD noted d. Continue incentive spirometry  3. Chronic normocytic anemia likely from chronic illness a. Stable, hemoglobin uptrending 8.7> 8.9.  Continue home iron supplement.  4. Stage III lung cancer a. Follows with Dr. Julien Nordmann, keep appointment. b. Oncology notified  5. Prediabetes a. HA1C 5.8 on 12/31/2019 b. Continue sensitive sliding scale, and avoid hypoglycemia.  Hypothyroidism  last TSH 0.363 on 11/05/2019 Continue levothyroxine  Ambulatory dysfunction post orthopedic surgery Continue PT OT with recommendations from orthopedic surgery Plan is to discharge to SNF for rehab Continue fall precaution   DVT prophylaxis: SQ Lovenox daily Code Status: Full Code   Family Communication:  Updated her daughter at bedside.   Status is: Inpatient     Consultants   Orthopedic surgery  Plastic surgery  Procedures  Left total hip arthroplasty, closed reduction left distal radius fracture on 12/31/2019 by Dr. [name].   Status is: Inpatient    Dispo: The patient is from: Home               Anticipated d/c is to:SNF  Anticipated d/c date is:01/03/20 or 01/04/20 pending SNF bed placement.              Patient currently medically cleared for discharge.     Objective: Vitals:   01/01/20 1534 01/01/20 2123 01/02/20 0500 01/02/20 0815  BP: (!) 110/50 (!) 106/63 (!) 113/57 (!) 113/47  Pulse: 93 93 94 86  Resp: 17 16 18 17   Temp: 98.5 F (36.9 C) 98.4 F (36.9 C) 98.9 F (37.2 C)  98.5 F (36.9 C)  TempSrc: Oral Oral Oral Oral  SpO2: 100% 100% 99% 100%  Weight:      Height:        Intake/Output Summary (Last 24 hours) at 01/02/2020 1200 Last data filed at 01/02/2020 0500 Gross per 24 hour  Intake 463.39 ml  Output --  Net 463.39 ml   Filed Weights   12/31/19 0316 12/31/19 1442  Weight: 60.3 kg 62.6 kg    Exam:  . General: 79 y.o. year-old female well-developed well-nourished in no acute distress.  Alert and oriented x3.   . Cardiovascular: Regular rate and rhythm no rubs or gallops.   [name] Respiratory: Clear to auscultation no wheezes or rales.   . Abdomen: Soft nontender normal bowel sounds present. . Musculoskeletal: Improving mild left lower extremity edema.  2 out of 4 pulses in all 4 extremities.   [name] Psychiatry: Mood is appropriate for condition and setting.   Data Reviewed: CBC: Recent Labs  Lab 12/31/19 0330 01/01/20 0201 01/02/20 0302  WBC 8.4 8.4 9.3  NEUTROABS 6.6  --   --   HGB 8.1* 8.7* 8.9*  HCT 27.0* 27.7* 28.7*  MCV 91.8 89.9 90.3  PLT 300 261 409   Basic Metabolic Panel: Recent Labs  Lab 12/31/19 0330 01/01/20 0201 01/02/20 0302  NA 138 135 133*  K 3.7 3.4* 3.8  CL 104 99 98  CO2 26 28 29   GLUCOSE 130* 196* 186*  BUN 9 6* 6*  CREATININE 0.50 0.43* 0.53  CALCIUM 9.4 9.6 8.8*   GFR: Estimated Creatinine Clearance: 52.2 mL/min (by C-G formula based on SCr of 0.53 mg/dL). Liver Function Tests: Recent Labs  Lab 12/31/19 0330 01/01/20 0201  AST 30 20  ALT 9 13  ALKPHOS 58 53  BILITOT 0.8 0.5  PROT 6.7 6.0*  ALBUMIN 2.4* 1.9*   No results for input(s): LIPASE, AMYLASE in the last 168 hours. No results for input(s): AMMONIA in the last 168 hours. Coagulation Profile: Recent Labs  Lab 12/31/19 0330  INR 1.2   Cardiac Enzymes: No results for input(s): CKTOTAL, CKMB, CKMBINDEX, TROPONINI in the last 168 hours. BNP (last 3 results) No results for input(s): PROBNP in the last 8760 hours. HbA1C: Recent Labs     12/31/19 1500  HGBA1C 5.8*   CBG: Recent Labs  Lab 01/01/20 1136 01/01/20 1621 01/01/20 2122 01/02/20 0638 01/02/20 1133  GLUCAP 120* 109* 172* 124* 122*   Lipid Profile: No results for input(s): CHOL, HDL, LDLCALC, TRIG, CHOLHDL, LDLDIRECT in the last 72 hours. Thyroid Function Tests: No results for input(s): TSH, T4TOTAL, FREET4, T3FREE, THYROIDAB in the last 72 hours. Anemia Panel: No results for input(s): VITAMINB12, FOLATE, FERRITIN, TIBC, IRON, RETICCTPCT in the last 72 hours. Urine analysis:    Component Value Date/Time   COLORURINE YELLOW 05/13/2018 2053   APPEARANCEUR CLEAR 05/13/2018 2053   LABSPEC 1.016 05/13/2018 2053   PHURINE 6.0 05/13/2018 2053   GLUCOSEU NEGATIVE 05/13/2018 2053   HGBUR SMALL (A) 05/13/2018 2053   BILIRUBINUR  NEGATIVE 05/13/2018 2053   [place] NEGATIVE 05/13/2018 2053   PROTEINUR 30 (A) 05/13/2018 2053   NITRITE NEGATIVE 05/13/2018 2053   LEUKOCYTESUR TRACE (A) 05/13/2018 2053   Sepsis Labs: @LABRCNTIP (procalcitonin:4,lacticidven:4)  ) Recent Results (from the past 240 hour(s))  SARS Coronavirus 2 by RT PCR (hospital order, performed in [facility] hospital lab) Nasopharyngeal Nasopharyngeal Swab     Status: None   Collection Time: 12/31/19  3:30 AM   Specimen: Nasopharyngeal Swab  Result Value Ref Range Status   SARS Coronavirus 2 NEGATIVE NEGATIVE Final    Comment: (NOTE) SARS-CoV-2 target nucleic acids are NOT DETECTED.  The SARS-CoV-2 RNA is generally detectable in upper and lower respiratory specimens during the acute phase of infection. The lowest concentration of SARS-CoV-2 viral copies this assay can detect is 250 copies / mL. A negative result does not preclude SARS-CoV-2 infection and should not be used as the sole basis for treatment or other patient management decisions.  A negative result may occur with improper specimen collection / handling, submission of specimen other than nasopharyngeal swab, presence of viral  mutation(s) within the areas targeted by this assay, and inadequate number of viral copies (<250 copies / mL). A negative result must be combined with clinical observations, patient history, and epidemiological information.  Fact Sheet for Patients:   StrictlyIdeas.no  Fact Sheet for Healthcare Providers: BankingDealers.co.za  This test is not yet approved or  cleared by the [place] FDA and has been authorized for detection and/or diagnosis of SARS-CoV-2 by FDA under an Emergency Use Authorization (EUA).  This EUA will remain in effect (meaning this test can be used) for the duration of the COVID-19 declaration under Section 564(b)(1) of the Act, 21 U.S.C. section 360bbb-3(b)(1), unless the authorization is terminated or revoked sooner.  Performed at [facility], [place] [address], [place], [place] [address]       Studies: No results found.  Scheduled Meds: . sodium chloride   Intravenous Once  . bupivacaine liposome  20 mL Infiltration Once  . docusate sodium  100 mg Oral BID  . enoxaparin (LOVENOX) injection  40 mg Subcutaneous Q24H  . feeding supplement (GLUCERNA SHAKE)  237 mL Oral TID BM  . ferrous sulfate  325 mg Oral Q breakfast  . insulin aspart  0-9 Units Subcutaneous TID WC  . levothyroxine  88 mcg Oral QAC breakfast  . pravastatin  40 mg Oral Daily  . Ensure Max Protein  11 oz Oral Daily  . vortioxetine HBr  10 mg Oral Daily    Continuous Infusions: . methocarbamol (ROBAXIN) IV       LOS: 2 days     Kayleen Memos, MD [facility] Pager [phone]  If 7PM-7AM, please contact night-coverage [email or web address] Password [email or web address] 01/02/2020, 12:00 PM

## 2020-01-02 NOTE — TOC Progression Note (Signed)
Transition of Care [facility]) - Progression Note    Patient Details  Name: [name] MRN: [id number] Date of Birth: 1940-11-20  Transition of Care [facility]) CM/SW Contact  Loreta Blouch [place], [place] Phone [phone] 01/02/2020, 2:50 PM  Clinical Narrative:    Phone call to patient's daughter to discuss discharge plan. Per patient's daughter, she was waiting to hear back from [facility] regarding patient's radiation treatments to determine if she wanted patient to receive rehab in [place] or [place]. She has not heard back from them yet and has given the okay to send out patient's information for rehab facilities in [place]. Daughter's first choice is [facility]. Placement process discussed, Fl2 faxed out to the local facilities. This Education officer, museum will follow up with patient's daughter once bed offers received.  [place], LCSW Transitions of Care [phone]   Expected Discharge Plan: Skilled Nursing Facility Barriers to Discharge: Continued Medical Work up, Ship broker  Expected Discharge Plan and Services Expected Discharge Plan: [place]   Discharge Planning Services: CM Consult Post Acute Care Choice: [place] Living arrangements for the past 2 months: Single Family Home Expected Discharge Date: 01/02/20               DME Arranged:  (Home O2 at 2L/min, WC, RW, 3:1)           HH Agency:  (Active with [facility] in [place])         Social Determinants of Health ([place]) Interventions    Readmission Risk Interventions Readmission Risk Prevention Plan 01/01/2020  Transportation Screening Complete  PCP or Specialist Appt within 3-5 Days Complete  HRI or [place] Complete  Social Work Consult for [place] Planning/Counseling Complete  Palliative Care Screening Complete  Medication Review Press photographer) Complete  Some recent data might be hidden

## 2020-01-02 NOTE — Progress Notes (Signed)
01/02/20 1950  Assess: MEWS Score  Temp (!) 101.6 F (38.7 C)  BP (!) 132/51  Pulse Rate 103  Resp 18  Level of Consciousness Alert  SpO2 100 %  O2 Device Nasal Cannula  Assess: MEWS Score  MEWS Temp 2  MEWS Systolic 0  MEWS Pulse 1  MEWS RR 0  MEWS LOC 0  MEWS Score 3  MEWS Score Color Yellow  Assess: if the MEWS score is Yellow or Red  Were vital signs taken at a resting state? Yes  Focused Assessment No change from prior assessment  Early Detection of Sepsis Score *See Row Information* Low  MEWS guidelines implemented *See Row Information* Yes  Treat  MEWS Interventions Administered scheduled meds/treatments;Administered prn meds/treatments;Other (Comment) (MD notified)  Pain Scale 0-10  Pain Score 0  Notify: Charge Nurse/RN  Name of Charge Nurse/RN Notified yerney  Date Charge Nurse/RN Notified 01/02/20  Time Charge Nurse/RN Notified 2026  Notify: Provider  Provider Name/Title Blount  Date Provider Notified 01/02/20  Time Provider Notified 2026  Notification Type [place]  Notification Reason Change in status  Response See new orders  Date of Provider Response 01/02/20  Time of Provider Response 2027  Document  Progress note created (see row info) Yes     01/02/20 1950  Assess: MEWS Score  Temp (!) 101.6 F (38.7 C)  BP (!) 132/51  Pulse Rate 103  Resp 18  Level of Consciousness Alert  SpO2 100 %  O2 Device Nasal Cannula  Assess: MEWS Score  MEWS Temp 2  MEWS Systolic 0  MEWS Pulse 1  MEWS RR 0  MEWS LOC 0  MEWS Score 3  MEWS Score Color Yellow  Assess: if the MEWS score is Yellow or Red  Were vital signs taken at a resting state? Yes  Focused Assessment No change from prior assessment  Early Detection of Sepsis Score *See Row Information* Low  MEWS guidelines implemented *See Row Information* Yes  Treat  MEWS Interventions Administered scheduled meds/treatments;Administered prn meds/treatments;Other (Comment) (MD notified)  Pain Scale 0-10   Pain Score 0  Notify: Charge Nurse/RN  Name of Charge Nurse/RN Notified yerney  Date Charge Nurse/RN Notified 01/02/20  Time Charge Nurse/RN Notified 2026  Notify: Provider  Provider Name/Title Blount  Date Provider Notified 01/02/20  Time Provider Notified 2026  Notification Type [place]  Notification Reason Change in status  Response See new orders  Date of Provider Response 01/02/20  Time of Provider Response 2027  Document  Progress note created (see row info) Yes     01/02/20 1950  Assess: MEWS Score  Temp (!) 101.6 F (38.7 C)  BP (!) 132/51  Pulse Rate 103  Resp 18  Level of Consciousness Alert  SpO2 100 %  O2 Device Nasal Cannula  Assess: MEWS Score  MEWS Temp 2  MEWS Systolic 0  MEWS Pulse 1  MEWS RR 0  MEWS LOC 0  MEWS Score 3  MEWS Score Color Yellow  Assess: if the MEWS score is Yellow or Red  Were vital signs taken at a resting state? Yes  Focused Assessment No change from prior assessment  Early Detection of Sepsis Score *See Row Information* Low  MEWS guidelines implemented *See Row Information* Yes  Treat  MEWS Interventions Administered scheduled meds/treatments;Administered prn meds/treatments;Other (Comment) (MD notified)  Pain Scale 0-10  Pain Score 0  Notify: Charge Nurse/RN  Name of Charge Nurse/RN Notified yerney  Date Charge Nurse/RN Notified 01/02/20  Time Charge Nurse/RN Notified  2026  Notify: Provider  Provider Name/Title Blount  Date Provider Notified 01/02/20  Time Provider Notified 2026  Notification Type [place]  Notification Reason Change in status  Response See new orders  Date of Provider Response 01/02/20  Time of Provider Response 2027  Document  Progress note created (see row info) Yes     01/02/20 1950  Assess: MEWS Score  Temp (!) 101.6 F (38.7 C)  BP (!) 132/51  Pulse Rate 103  Resp 18  Level of Consciousness Alert  SpO2 100 %  O2 Device Nasal Cannula  Assess: MEWS Score  MEWS Temp 2  MEWS Systolic 0   MEWS Pulse 1  MEWS RR 0  MEWS LOC 0  MEWS Score 3  MEWS Score Color Yellow  Assess: if the MEWS score is Yellow or Red  Were vital signs taken at a resting state? Yes  Focused Assessment No change from prior assessment  Early Detection of Sepsis Score *See Row Information* Low  MEWS guidelines implemented *See Row Information* Yes  Treat  MEWS Interventions Administered scheduled meds/treatments;Administered prn meds/treatments;Other (Comment) (MD notified)  Pain Scale 0-10  Pain Score 0  Notify: Charge Nurse/RN  Name of Charge Nurse/RN Notified yerney  Date Charge Nurse/RN Notified 01/02/20  Time Charge Nurse/RN Notified 2026  Notify: Provider  Provider Name/Title Blount  Date Provider Notified 01/02/20  Time Provider Notified 2026  Notification Type [place]  Notification Reason Change in status  Response See new orders  Date of Provider Response 01/02/20  Time of Provider Response 2027  Document  Progress note created (see row info) Yes     01/02/20 1950  Assess: MEWS Score  Temp (!) 101.6 F (38.7 C)  BP (!) 132/51  Pulse Rate 103  Resp 18  Level of Consciousness Alert  SpO2 100 %  O2 Device Nasal Cannula  Assess: MEWS Score  MEWS Temp 2  MEWS Systolic 0  MEWS Pulse 1  MEWS RR 0  MEWS LOC 0  MEWS Score 3  MEWS Score Color Yellow  Assess: if the MEWS score is Yellow or Red  Were vital signs taken at a resting state? Yes  Focused Assessment No change from prior assessment  Early Detection of Sepsis Score *See Row Information* Low  MEWS guidelines implemented *See Row Information* Yes  Treat  MEWS Interventions Administered scheduled meds/treatments;Administered prn meds/treatments;Other (Comment) (MD notified)  Pain Scale 0-10  Pain Score 0  Notify: Charge Nurse/RN  Name of Charge Nurse/RN Notified yerney  Date Charge Nurse/RN Notified 01/02/20  Time Charge Nurse/RN Notified 2026  Notify: Provider  Provider Name/Title Blount  Date Provider Notified  01/02/20  Time Provider Notified 2026  Notification Type [place]  Notification Reason Change in status  Response See new orders  Date of Provider Response 01/02/20  Time of Provider Response 2027  Document  Progress note created (see row info) Yes     01/02/20 1950  Assess: MEWS Score  Temp (!) 101.6 F (38.7 C)  BP (!) 132/51  Pulse Rate 103  Resp 18  Level of Consciousness Alert  SpO2 100 %  O2 Device Nasal Cannula  Assess: MEWS Score  MEWS Temp 2  MEWS Systolic 0  MEWS Pulse 1  MEWS RR 0  MEWS LOC 0  MEWS Score 3  MEWS Score Color Yellow  Assess: if the MEWS score is Yellow or Red  Were vital signs taken at a resting state? Yes  Focused Assessment No change from prior assessment  Early  Detection of Sepsis Score *See Row Information* Low  MEWS guidelines implemented *See Row Information* Yes  Treat  MEWS Interventions Administered scheduled meds/treatments;Administered prn meds/treatments;Other (Comment) (MD notified)  Pain Scale 0-10  Pain Score 0  Notify: Charge Nurse/RN  Name of Charge Nurse/RN Notified yerney  Date Charge Nurse/RN Notified 01/02/20  Time Charge Nurse/RN Notified 2026  Notify: Provider  Provider Name/Title Blount  Date Provider Notified 01/02/20  Time Provider Notified 2026  Notification Type [place]  Notification Reason Change in status  Response See new orders  Date of Provider Response 01/02/20  Time of Provider Response 2027  Document  Progress note created (see row info) Yes     01/02/20 1950  Assess: MEWS Score  Temp (!) 101.6 F (38.7 C)  BP (!) 132/51  Pulse Rate 103  Resp 18  Level of Consciousness Alert  SpO2 100 %  O2 Device Nasal Cannula  Assess: MEWS Score  MEWS Temp 2  MEWS Systolic 0  MEWS Pulse 1  MEWS RR 0  MEWS LOC 0  MEWS Score 3  MEWS Score Color Yellow  Assess: if the MEWS score is Yellow or Red  Were vital signs taken at a resting state? Yes  Focused Assessment No change from prior assessment  Early  Detection of Sepsis Score *See Row Information* Low  MEWS guidelines implemented *See Row Information* Yes  Treat  MEWS Interventions Administered scheduled meds/treatments;Administered prn meds/treatments;Other (Comment) (MD notified)  Pain Scale 0-10  Pain Score 0  Notify: Charge Nurse/RN  Name of Charge Nurse/RN Notified yerney  Date Charge Nurse/RN Notified 01/02/20  Time Charge Nurse/RN Notified 2026  Notify: Provider  Provider Name/Title Blount  Date Provider Notified 01/02/20  Time Provider Notified 2026  Notification Type [place]  Notification Reason Change in status  Response See new orders  Date of Provider Response 01/02/20  Time of Provider Response 2027  Document  Progress note created (see row info) Yes

## 2020-01-02 NOTE — Progress Notes (Signed)
   01/02/20 2209  Assess: MEWS Score  Temp 99.7 F (37.6 C)  BP (!) 121/46  Pulse Rate 98  Resp 18  Level of Consciousness Alert  SpO2 97 %  O2 Device Nasal Cannula  Assess: MEWS Score  MEWS Temp 0  MEWS Systolic 0  MEWS Pulse 0  MEWS RR 0  MEWS LOC 0  MEWS Score 0  MEWS Score Color Green  Assess: if the MEWS score is Yellow or Red  Were vital signs taken at a resting state? Yes  Focused Assessment No change from prior assessment  Early Detection of Sepsis Score *See Row Information* Low  MEWS guidelines implemented *See Row Information* Yes  Treat  MEWS Interventions Other (Comment)  Pain Scale 0-10  Pain Score 0  Take Vital Signs  Increase Vital Sign Frequency  Yellow: Q 2hr X 2 then Q 4hr X 2, if remains yellow, continue Q 4hrs  Escalate  MEWS: Escalate Yellow: discuss with charge nurse/RN and consider discussing with provider and RRT

## 2020-01-03 DIAGNOSIS — S52502A Unspecified fracture of the lower end of left radius, initial encounter for closed fracture: Secondary | ICD-10-CM | POA: Diagnosis not present

## 2020-01-03 LAB — GLUCOSE, CAPILLARY
Glucose-Capillary: 123 mg/dL — ABNORMAL HIGH (ref 70–99)
Glucose-Capillary: 119 mg/dL — ABNORMAL HIGH (ref 70–99)
Glucose-Capillary: 131 mg/dL — ABNORMAL HIGH (ref 70–99)
Glucose-Capillary: 195 mg/dL — ABNORMAL HIGH (ref 70–99)

## 2020-01-03 LAB — CBC
HCT: 27.7 % — ABNORMAL LOW (ref 36.0–46.0)
Hemoglobin: 8.6 g/dL — ABNORMAL LOW (ref 12.0–15.0)
MCH: 27.6 pg (ref 26.0–34.0)
MCHC: 31 g/dL (ref 30.0–36.0)
MCV: 88.8 fL (ref 80.0–100.0)
Platelets: 276 10*3/uL (ref 150–400)
RBC: 3.12 MIL/uL — ABNORMAL LOW (ref 3.87–5.11)
RDW: 15.3 % (ref 11.5–15.5)
WBC: 8.9 10*3/uL (ref 4.0–10.5)
nRBC: 0 % (ref 0.0–0.2)

## 2020-01-03 MED ORDER — GUAIFENESIN-CODEINE 100-10 MG/5ML PO SOLN: 10.0000 mL | Freq: Four times a day (QID) | ORAL | Status: DC | PRN

## 2020-01-03 MED ORDER — GUAIFENESIN-CODEINE 100-10 MG/5ML PO SOLN
10.0000 mL | Freq: Once | ORAL | Status: AC
  Administered 2020-01-03: 10 mL via ORAL
  Filled 2020-01-03: qty 10

## 2020-01-03 NOTE — Plan of Care (Signed)

## 2020-01-03 NOTE — Progress Notes (Signed)
Pt refusing to adhere to the npo order- spoke with family to educate on the npo order- they are insistent pt get something to moisten her mouth- water and gum made accessible to pt. Unable to provide explanation for the order at this time

## 2020-01-03 NOTE — Progress Notes (Signed)
No events. Patient stable and participating with PT. Will require SNF placement. Hgb stable.  ABLA. Follow up in 2 weeks in office.  Azucena Cecil, MD [facility] [phone] 1:18 PM

## 2020-01-03 NOTE — Progress Notes (Signed)
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 [name], type II diabetes, brain aneurysm, recent right hip replacement in May 2021, stage III squamous cell lung cancer (follows with Dr. [name]) who presented to [facility] ED on 7/22 after a fall from ground-level after getting up from sitting position to go to the bathroom and fell sideways without LOC.  She had subsequent left hip and wrist pain.  ED Course: CT scan most notable for left femoral neck angulated comminuted fracture and acute styloid ulnar fracture with suspected chronic left radius fracture.  Wrist was splinted in the ED.  Orthopedic surgery was consulted as well as Dr. Claudia Desanctis, plastic surgery who recommended CT scan of left wrist to clarify fracture details and acuity.  Most normal lab Hb 8.1 slightly below baseline of 8.7.   Post left total hip arthroplasty, closed reduction left distal radius fracture on 12/31/2019 by Dr. [name].  01/03/20: Seen and examined.  LUE evaluated by Dr. Marcelino Scot.  Plan for non operative management.  Assessment/Plan: Active Problems:   Fracture of femoral neck, left, closed (HCC)   Protein-calorie malnutrition, severe   Closed fracture of left distal radius   Closed fracture of left wrist  Left hip fracture and left wrist fracture secondary to mechanical fall from ground height s/p left total hip arthroplasty, closed reduction left distal radius fracture on 12/31/2019 by Dr. [name].  a. CT left wrist ordered per plastic surgery: Acute impacted fracture of distal radial metaphysis with intra-articular extension to the radiocarpal joint with dominant dorsal fracture fragment of distal radius displaced dorsally and acute mildly displaced ulnar styloid fracture. b. Orthopedic and plastic surgery  following, stable post surgery.   c. Pain control in place. d. PT recommended SNF. e. TOC assisting with SNF placement. f. LUE evaluated by Dr. Marcelino Scot.  Plan for non operative management.   2. Acute, resolved, on chronic hypoxic respiratory failure in setting of ILD and known stage III squamous cell lung cancer a. Suspect from atelectasis, O2 saturation and requirement back to baseline b. On 2 L/min at baseline with O2 saturation of 100% on 2L. c. CXR with known LUL mass and chronic ILD noted, no acute disease process. d. Continue incentive spirometer  3. Chronic normocytic anemia likely from chronic illness a. Stable, hemoglobin 8.7> 8.9> 8.6. b. Continue home iron supplement.  4. Stage III lung cancer a. Follows with Dr. Julien Nordmann, keep appointment.  5. Prediabetes a. HA1C 5.8 on 12/31/2019 b. Continue sensitive sliding scale, and avoid hypoglycemia.  Hypothyroidism  last TSH 0.363 on 11/05/2019 Continue levothyroxine  Ambulatory dysfunction post orthopedic surgery Continue PT OT with recommendations from orthopedic surgery Plan is to discharge to SNF for rehab Continue fall precaution   DVT prophylaxis: SQ Lovenox daily Code Status: Full Code   Family Communication:  Updated her son via phone.    Consultants   Orthopedic surgery  Plastic surgery  Procedures  Left total hip arthroplasty, closed reduction left distal radius fracture on 12/31/2019 by Dr. [name].   Status is: Inpatient    Dispo: The patient is from: Home               Anticipated d/c is to:SNF              Anticipated d/c date is: 01/04/20  pending SNF bed placement.              Patient currently medically cleared for discharge.     Objective: Vitals:   01/02/20 2359 01/03/20 0208 01/03/20 0400 01/03/20 0751  BP: (!) 124/54 122/65 (!) 125/53 (!) 131/59  Pulse: 92 93 93 94  Resp: 18 18 18 16   Temp: 98.7 F (37.1 C) 98.3 F (36.8 C) 98.6 F (37 C) 98.4 F (36.9 C)  TempSrc: Oral Oral  Oral Oral  SpO2: 100% 100% 100% 100%  Weight:      Height:       No intake or output data in the 24 hours ending 01/03/20 1254 Filed Weights   12/31/19 0316 12/31/19 1442  Weight: 60.3 kg 62.6 kg    Exam:  . General: 79 y.o. year-old female well-developed well-nourished in no acute distress.  Alert oriented x3.   . Cardiovascular: Regular rate and rhythm no rubs or gallops. [name] Respiratory: Clear to auscultation no wheezes no rales. . Abdomen: Soft Nontender Normal Bowel Sounds Present. . Musculoskeletal: Improving mild left lower extremity edema.   [name] Psychiatry: Mood is appropriate for condition and setting.   Data Reviewed: CBC: Recent Labs  Lab 12/31/19 0330 01/01/20 0201 01/02/20 0302 01/03/20 0255  WBC 8.4 8.4 9.3 8.9  NEUTROABS 6.6  --   --   --   HGB 8.1* 8.7* 8.9* 8.6*  HCT 27.0* 27.7* 28.7* 27.7*  MCV 91.8 89.9 90.3 88.8  PLT 300 261 260 194   Basic Metabolic Panel: Recent Labs  Lab 12/31/19 0330 01/01/20 0201 01/02/20 0302  NA 138 135 133*  K 3.7 3.4* 3.8  CL 104 99 98  CO2 26 28 29   GLUCOSE 130* 196* 186*  BUN 9 6* 6*  CREATININE 0.50 0.43* 0.53  CALCIUM 9.4 9.6 8.8*   GFR: Estimated Creatinine Clearance: 52.2 mL/min (by C-G formula based on SCr of 0.53 mg/dL). Liver Function Tests: Recent Labs  Lab 12/31/19 0330 01/01/20 0201  AST 30 20  ALT 9 13  ALKPHOS 58 53  BILITOT 0.8 0.5  PROT 6.7 6.0*  ALBUMIN 2.4* 1.9*   No results for input(s): LIPASE, AMYLASE in the last 168 hours. No results for input(s): AMMONIA in the last 168 hours. Coagulation Profile: Recent Labs  Lab 12/31/19 0330  INR 1.2   Cardiac Enzymes: No results for input(s): CKTOTAL, CKMB, CKMBINDEX, TROPONINI in the last 168 hours. BNP (last 3 results) No results for input(s): PROBNP in the last 8760 hours. HbA1C: Recent Labs    12/31/19 1500  HGBA1C 5.8*   CBG: Recent Labs  Lab 01/02/20 1133 01/02/20 1603 01/02/20 2035 01/03/20 0753 01/03/20 1140  GLUCAP  122* 143* 138* 119* 131*   Lipid Profile: No results for input(s): CHOL, HDL, LDLCALC, TRIG, CHOLHDL, LDLDIRECT in the last 72 hours. Thyroid Function Tests: No results for input(s): TSH, T4TOTAL, FREET4, T3FREE, THYROIDAB in the last 72 hours. Anemia Panel: No results for input(s): VITAMINB12, FOLATE, FERRITIN, TIBC, IRON, RETICCTPCT in the last 72 hours. Urine analysis:    Component Value Date/Time   COLORURINE YELLOW 05/13/2018 2053   APPEARANCEUR CLEAR 05/13/2018 2053   LABSPEC 1.016 05/13/2018 2053   PHURINE 6.0 05/13/2018 2053   GLUCOSEU NEGATIVE 05/13/2018 2053   HGBUR SMALL (A) 05/13/2018 2053   [place] NEGATIVE 05/13/2018 2053   [place] NEGATIVE 05/13/2018 2053   PROTEINUR 30 (A) 05/13/2018 2053   NITRITE NEGATIVE 05/13/2018 2053   LEUKOCYTESUR TRACE (A) 05/13/2018 2053   Sepsis  Labs: @LABRCNTIP (procalcitonin:4,lacticidven:4)  ) Recent Results (from the past 240 hour(s))  SARS Coronavirus 2 by RT PCR (hospital order, performed in [facility] hospital lab) Nasopharyngeal Nasopharyngeal Swab     Status: None   Collection Time: 12/31/19  3:30 AM   Specimen: Nasopharyngeal Swab  Result Value Ref Range Status   SARS Coronavirus 2 NEGATIVE NEGATIVE Final    Comment: (NOTE) SARS-CoV-2 target nucleic acids are NOT DETECTED.  The SARS-CoV-2 RNA is generally detectable in upper and lower respiratory specimens during the acute phase of infection. The lowest concentration of SARS-CoV-2 viral copies this assay can detect is 250 copies / mL. A negative result does not preclude SARS-CoV-2 infection and should not be used as the sole basis for treatment or other patient management decisions.  A negative result may occur with improper specimen collection / handling, submission of specimen other than nasopharyngeal swab, presence of viral mutation(s) within the areas targeted by this assay, and inadequate number of viral copies (<250 copies / mL). A negative result must be  combined with clinical observations, patient history, and epidemiological information.  Fact Sheet for Patients:   StrictlyIdeas.no  Fact Sheet for Healthcare Providers: BankingDealers.co.za  This test is not yet approved or  cleared by the [place] FDA and has been authorized for detection and/or diagnosis of SARS-CoV-2 by FDA under an Emergency Use Authorization (EUA).  This EUA will remain in effect (meaning this test can be used) for the duration of the COVID-19 declaration under Section 564(b)(1) of the Act, 21 U.S.C. section 360bbb-3(b)(1), unless the authorization is terminated or revoked sooner.  Performed at [facility], [place] [address] [place], [place] [address]       Studies: DG Wrist 2 Views Left  Result Date: 01/02/2020 CLINICAL DATA:  Fall, wrist fracture, post reduction imaging EXAM: LEFT WRIST - 2 VIEW COMPARISON:  None. FINDINGS: Two view radiograph of the left wrist is performed within an external immobilizer which obscures fine bony detail. Impacted distal radial fracture now appears disimpacted to length and demonstrates neutral tilt of the distal radial surface and appropriate radial inclination. There is intra-articular extension of a longitudinal fracture plane likely into the lunate fossa with residual ulnar displacement of the fracture fragment. Ulnar styloid fracture again noted, distracted medial displaced but anatomically aligned at this point. IMPRESSION: Post reduction alignment as described above. Electronically Signed   By: Fidela Salisbury MD   On: 01/02/2020 22:58   DG CHEST PORT 1 VIEW  Result Date: 01/02/2020 CLINICAL DATA:  Cough EXAM: PORTABLE CHEST 1 VIEW COMPARISON:  12/31/2019, 12/08/2019 FINDINGS: Single frontal view of the chest demonstrates large left upper lobe mass compatible with malignancy. No significant change since prior study. Diffuse interstitial prominence,  scarring, and fibrosis again noted throughout the lungs unchanged. Cardiac silhouette is stable. No acute bony abnormality. IMPRESSION: 1. Stable left upper lobe mass compatible with malignancy. 2. Diffuse pulmonary scarring and fibrosis, with no new airspace disease. Electronically Signed   By: Randa Ngo M.D.   On: 01/02/2020 20:50    Scheduled Meds: . sodium chloride   Intravenous Once  . bupivacaine liposome  20 mL Infiltration Once  . docusate sodium  100 mg Oral BID  . enoxaparin (LOVENOX) injection  40 mg Subcutaneous Q24H  . feeding supplement (GLUCERNA SHAKE)  237 mL Oral TID BM  . ferrous sulfate  325 mg Oral Q breakfast  . insulin aspart  0-9 Units Subcutaneous TID WC  . levothyroxine  88 mcg  Oral QAC breakfast  . pravastatin  40 mg Oral Daily  . Ensure Max Protein  11 oz Oral Daily  . vortioxetine HBr  10 mg Oral Daily    Continuous Infusions: . methocarbamol (ROBAXIN) IV       LOS: 3 days     Kayleen Memos, MD [facility] Pager [phone]  If 7PM-7AM, please contact night-coverage [email or web address] Password [facility] 01/03/2020, 12:54 PM

## 2020-01-03 NOTE — Progress Notes (Signed)
Informed in regards of not eating or drinking- including gum- pt would not give up the gum in her mouth

## 2020-01-03 NOTE — Progress Notes (Signed)
°   01/02/20 2359  Assess: MEWS Score  Temp 98.7 F (37.1 C)  BP (!) 124/54  Pulse Rate 92  Resp 18  Level of Consciousness Alert  SpO2 100 %  O2 Device Nasal Cannula  O2 Flow Rate (L/min) 2 L/min  Assess: MEWS Score  MEWS Temp 0  MEWS Systolic 0  MEWS Pulse 0  MEWS RR 0  MEWS LOC 0  MEWS Score 0  MEWS Score Color Green  Assess: if the MEWS score is Yellow or Red  Were vital signs taken at a resting state? Yes  Focused Assessment Change from prior assessment (see assessment flowsheet)  Early Detection of Sepsis Score *See Row Information* Low  MEWS guidelines implemented *See Row Information* No, previously yellow, continue vital signs every 4 hours  Treat  MEWS Interventions Administered scheduled meds/treatments  Pain Scale 0-10  Pain Score 0  Document  Patient Outcome Stabilized after interventions  Progress note created (see row info) Yes

## 2020-01-03 NOTE — Progress Notes (Signed)
   01/03/20 0400  Assess: MEWS Score  Temp 98.6 F (37 C)  BP (!) 125/53  Pulse Rate 93  Resp 18  Level of Consciousness Alert  SpO2 100 %  O2 Device Nasal Cannula  O2 Flow Rate (L/min) 2 L/min  Assess: MEWS Score  MEWS Temp 0  MEWS Systolic 0  MEWS Pulse 0  MEWS RR 0  MEWS LOC 0  MEWS Score 0  MEWS Score Color Green  Assess: if the MEWS score is Yellow or Red  Were vital signs taken at a resting state? Yes  Focused Assessment No change from prior assessment  Early Detection of Sepsis Score *See Row Information* Low  MEWS guidelines implemented *See Row Information* No, previously yellow, continue vital signs every 4 hours  Treat  MEWS Interventions Administered scheduled meds/treatments  Pain Scale 0-10  Pain Score 0  Document  Patient Outcome Stabilized after interventions  Progress note created (see row info) Yes

## 2020-01-03 NOTE — Progress Notes (Signed)
Pt has incentive spirometer and instructed for it- does it when she wants to

## 2020-01-03 NOTE — Consult Note (Signed)
Orthopaedic Trauma Requesting MD: Eduard Roux, MD  Appreciate opportunity to see [name] and to discuss with her and family whether she would benefit from, and like to proceed with, ORIF of the left wrist in the setting of polytrauma. We spent over 30 minutes together of direct face time looking through the x-rays and CT scan, confirming examination findings, and going through the risks, benefits, potential options for anesthesia and both short long term rehabilitation. Following that discussion, all of [place] reached consensus that continuing nonoperative management would be the best decision at this time.  Appreciate Dr. Phoebe Sharps excellent care!   Sylvia Lasker, MD [facility], [facility] [phone]

## 2020-01-03 NOTE — Progress Notes (Signed)
[name] had a coughing spell this afternoon.  She has a history of chronic cough, was diagnosed with squamous cell lung cancer.  States coughing spells are not new to her.  They have been occurring intermittently.  Independently reviewed CXR done last night.  No evidence of acute pulmonary infiltrates or pulmonary edema.  Guafeinesen with codeine cough syrup added as needed for symptom management.  Will place her on telemetry and continue to closely monitor for any acute changes.

## 2020-01-03 NOTE — TOC Progression Note (Signed)
Transition of Care [facility]) - Progression Note    Patient Details  Name: GEETA DWORKIN MRN: [id number] Date of Birth: 01-29-1941  Transition of Care [facility]) CM/SW Contact  Elliot Gurney [place], [place] Phone Number: [phone] 01/03/2020, 3:06 PM  Clinical Narrative:    Phone call to patient's daughter to review bed offers up to this point. Three bed offers provided. Patient's daughter would like to see more offers come in before a decision is made. Patient's daughter informed that a member of the Transition of Care team will follow up with her regarding update of any additional bed offers.   [address] LCSW Transition of Care [phone]    Expected Discharge Plan: Skilled Nursing Facility Barriers to Discharge: Continued Medical Work up, Ship broker  Expected Discharge Plan and Services Expected Discharge Plan: [place]   Discharge Planning Services: CM Consult Post Acute Care Choice: [place] Living arrangements for the past 2 months: Single Family Home Expected Discharge Date: 01/02/20               DME Arranged:  (Home O2 at 2L/min, WC, RW, 3:1)           HH Agency:  (Active with [facility] in [place])         Social Determinants of Health ([place]) Interventions    Readmission Risk Interventions Readmission Risk Prevention Plan 01/01/2020  Transportation Screening Complete  PCP or Specialist Appt within 3-5 Days Complete  HRI or [place] Complete  Social Work Consult for [place] Planning/Counseling Complete  Palliative Care Screening Complete  Medication Review Press photographer) Complete  Some recent data might be hidden

## 2020-01-03 NOTE — Plan of Care (Signed)

## 2020-01-04 ENCOUNTER — Inpatient Hospital Stay: Payer: Medicare Other

## 2020-01-04 DIAGNOSIS — S52502A Unspecified fracture of the lower end of left radius, initial encounter for closed fracture: Secondary | ICD-10-CM | POA: Diagnosis not present

## 2020-01-04 LAB — GLUCOSE, CAPILLARY
Glucose-Capillary: 146 mg/dL — ABNORMAL HIGH (ref 70–99)
Glucose-Capillary: 111 mg/dL — ABNORMAL HIGH (ref 70–99)
Glucose-Capillary: 130 mg/dL — ABNORMAL HIGH (ref 70–99)
Glucose-Capillary: 156 mg/dL — ABNORMAL HIGH (ref 70–99)

## 2020-01-04 MED ORDER — GABAPENTIN 100 MG PO CAPS
100.0000 mg | ORAL_CAPSULE | Freq: Three times a day (TID) | ORAL | Status: DC
  Administered 2020-01-05 – 2020-01-07 (×8): 100 mg via ORAL
  Filled 2020-01-04 (×8): qty 1

## 2020-01-04 MED ORDER — MORPHINE SULFATE (PF) 2 MG/ML IV SOLN
1.0000 mg | Freq: Once | INTRAVENOUS | Status: AC
  Administered 2020-01-04: 1 mg via INTRAVENOUS
  Filled 2020-01-04 (×2): qty 1

## 2020-01-04 MED ORDER — DM-GUAIFENESIN ER 30-600 MG PO TB12
2.0000 | ORAL_TABLET | Freq: Two times a day (BID) | ORAL | Status: DC
  Administered 2020-01-04 – 2020-01-10 (×12): 2 via ORAL
  Filled 2020-01-04 (×12): qty 2

## 2020-01-04 MED ORDER — CODEINE SULFATE 15 MG PO TABS
30.0000 mg | ORAL_TABLET | ORAL | Status: DC | PRN
  Administered 2020-01-05: 30 mg via ORAL
  Filled 2020-01-04: qty 2

## 2020-01-04 MED ORDER — WHITE PETROLATUM EX OINT
TOPICAL_OINTMENT | CUTANEOUS | Status: AC
  Filled 2020-01-04: qty 28.35

## 2020-01-04 MED ORDER — GUAIFENESIN-CODEINE 100-10 MG/5ML PO SOLN
10.0000 mL | Freq: Four times a day (QID) | ORAL | Status: DC | PRN
  Administered 2020-01-04 (×2): 10 mL via ORAL
  Filled 2020-01-04 (×2): qty 10

## 2020-01-04 MED ORDER — SODIUM CHLORIDE 3 % IN NEBU
4.0000 mL | INHALATION_SOLUTION | Freq: Two times a day (BID) | RESPIRATORY_TRACT | Status: AC
  Administered 2020-01-05 – 2020-01-07 (×5): 4 mL via RESPIRATORY_TRACT
  Filled 2020-01-04 (×7): qty 4

## 2020-01-04 NOTE — Progress Notes (Signed)
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 [name], type II diabetes, brain aneurysm, recent right hip replacement in May 2021, stage III squamous cell lung cancer (follows with Dr. [name]) who presented to [facility] ED on 7/22 after a fall from ground-level after getting up from sitting position to go to the bathroom and fell sideways without LOC.  She had subsequent left hip and wrist pain.  ED Course: CT scan most notable for left femoral neck angulated comminuted fracture and acute styloid ulnar fracture with suspected chronic left radius fracture.  Wrist was splinted in the ED.  Orthopedic surgery was consulted as well as Dr. Claudia Desanctis, plastic surgery who recommended CT scan of left wrist to clarify fracture details and acuity.  Most normal lab Hb 8.1 slightly below baseline of 8.7.   Post left total hip arthroplasty, closed reduction left distal radius fracture on 12/31/2019 by Dr. [name]. LUE evaluated by Dr. Marcelino Scot.  Plan for non operative management.  01/04/20: Seen and examined.  Has intermittent coughing spells.  Motivated to work with PT.   Assessment/Plan: Active Problems:   Fracture of femoral neck, left, closed (HCC)   Protein-calorie malnutrition, severe   Closed fracture of left distal radius   Closed fracture of left wrist  Left hip fracture and left wrist fracture secondary to mechanical fall from ground height s/p left total hip arthroplasty, closed reduction left distal radius fracture on 12/31/2019 by Dr. [name].  a. CT left wrist ordered per plastic surgery: Acute impacted fracture of distal radial metaphysis with intra-articular extension to the radiocarpal joint with dominant dorsal fracture fragment of distal radius displaced dorsally and acute mildly displaced  ulnar styloid fracture. b. Orthopedic and plastic surgery following, stable post surgery.   c. Pain control in place. d. PT recommended SNF. e. TOC assisting with SNF placement. f. LUE evaluated by Dr. Marcelino Scot.  Plan for non operative management g. Continue to work with PT by abiding with ortho's recommendations.   Acute, resolved, on chronic hypoxic respiratory failure in setting of ILD and known stage III squamous cell lung cancer a. Suspect from atelectasis, O2 saturation and requirement back to baseline b. On 2 L/min at baseline with O2 saturation of 100% on 2L. c. CXR with known LUL mass and chronic ILD noted, no acute disease process. d. Continue incentive spirometer e. Obtain home O2 eval for dc planning  Persistent cough with intermittent coughing spells likely secondary to her underlying lung malignancy Guafeinesein/codeine cough syrup PRN for coughing spells only Monitor  Chronic normocytic anemia likely from chronic illness a. Stable, hemoglobin 8.7> 8.9> 8.6. b. Continue home iron supplement.  Stage III lung cancer a. Follows with Dr. Julien Nordmann, keep appointment.  Prediabetes a. HA1C 5.8 on 12/31/2019 b. Continue sensitive sliding scale, and avoid hypoglycemia.  Hypothyroidism  last TSH 0.363 on 11/05/2019 Continue levothyroxine  Ambulatory dysfunction post orthopedic surgery Continue PT OT with recommendations from orthopedic surgery Plan is to discharge to SNF for rehab Continue fall precaution   DVT prophylaxis: SQ Lovenox daily Code Status: Full Code   Family Communication:  Updated her daughter in the room    Consultants   Orthopedic surgery  Plastic surgery  Procedures  Left total hip arthroplasty, closed reduction left distal radius fracture on 12/31/2019 by Dr. [name].  Status is: Inpatient    Dispo: The patient is from: Home               Anticipated d/c is to:SNF              Anticipated d/c date is: 01/05/20 pending SNF bed  placement.              Patient currently medically cleared for discharge.     Objective: Vitals:   01/03/20 1934 01/04/20 0328 01/04/20 0838 01/04/20 1411  BP: (!) 108/49 (!) 120/64 (!) 113/50 (!) 108/45  Pulse: 96 85 77 85  Resp: 16 16 17 17   Temp: 100 F (37.8 C) 98 F (36.7 C) 98.3 F (36.8 C) 99 F (37.2 C)  TempSrc: Oral Oral Oral Oral  SpO2: 100% 100% 99% 90%  Weight:      Height:       No intake or output data in the 24 hours ending 01/04/20 1457 Filed Weights   12/31/19 0316 12/31/19 1442  Weight: 60.3 kg 62.6 kg    Exam:  . General: 79 y.o. year-old female pleasant, well-developed well-nourished in no acute distress.  Alert and oriented x3.   . Cardiovascular: Regular rate and rhythm no rubs or gallops. [name] Respiratory: Mild rales at bases no wheezing noted.   . Abdomen: Soft nontender normal bowel sounds present.   . Musculoskeletal: No lower extremity edema bilaterally. [name] Psychiatry: Mood is appropriate for condition and setting.   Data Reviewed: CBC: Recent Labs  Lab 12/31/19 0330 01/01/20 0201 01/02/20 0302 01/03/20 0255  WBC 8.4 8.4 9.3 8.9  NEUTROABS 6.6  --   --   --   HGB 8.1* 8.7* 8.9* 8.6*  HCT 27.0* 27.7* 28.7* 27.7*  MCV 91.8 89.9 90.3 88.8  PLT 300 261 260 161   Basic Metabolic Panel: Recent Labs  Lab 12/31/19 0330 01/01/20 0201 01/02/20 0302  NA 138 135 133*  K 3.7 3.4* 3.8  CL 104 99 98  CO2 26 28 29   GLUCOSE 130* 196* 186*  BUN 9 6* 6*  CREATININE 0.50 0.43* 0.53  CALCIUM 9.4 9.6 8.8*   GFR: Estimated Creatinine Clearance: 52.2 mL/min (by C-G formula based on SCr of 0.53 mg/dL). Liver Function Tests: Recent Labs  Lab 12/31/19 0330 01/01/20 0201  AST 30 20  ALT 9 13  ALKPHOS 58 53  BILITOT 0.8 0.5  PROT 6.7 6.0*  ALBUMIN 2.4* 1.9*   No results for input(s): LIPASE, AMYLASE in the last 168 hours. No results for input(s): AMMONIA in the last 168 hours. Coagulation Profile: Recent Labs  Lab 12/31/19 0330  INR  1.2   Cardiac Enzymes: No results for input(s): CKTOTAL, CKMB, CKMBINDEX, TROPONINI in the last 168 hours. BNP (last 3 results) No results for input(s): PROBNP in the last 8760 hours. HbA1C: No results for input(s): HGBA1C in the last 72 hours. CBG: Recent Labs  Lab 01/03/20 0753 01/03/20 1140 01/03/20 1608 01/04/20 0724 01/04/20 1131  GLUCAP 119* 131* 195* 146* 111*   Lipid Profile: No results for input(s): CHOL, HDL, LDLCALC, TRIG, CHOLHDL, LDLDIRECT in the last 72 hours. Thyroid Function Tests: No results for input(s): TSH, T4TOTAL, FREET4, T3FREE, THYROIDAB in the last 72 hours. Anemia Panel: No results for input(s): VITAMINB12, FOLATE, FERRITIN, TIBC, IRON, RETICCTPCT in the last 72 hours. Urine analysis:    Component Value Date/Time   COLORURINE YELLOW 05/13/2018 2053   APPEARANCEUR CLEAR 05/13/2018 2053   LABSPEC 1.016 05/13/2018 2053   PHURINE 6.0  05/13/2018 2053   GLUCOSEU NEGATIVE 05/13/2018 2053   HGBUR SMALL (A) 05/13/2018 2053   BILIRUBINUR NEGATIVE 05/13/2018 2053   March ARB 05/13/2018 2053   PROTEINUR 30 (A) 05/13/2018 2053   NITRITE NEGATIVE 05/13/2018 2053   LEUKOCYTESUR TRACE (A) 05/13/2018 2053   Sepsis Labs: @LABRCNTIP (procalcitonin:4,lacticidven:4)  ) Recent Results (from the past 240 hour(s))  SARS Coronavirus 2 by RT PCR (hospital order, performed in [facility] hospital lab) Nasopharyngeal Nasopharyngeal Swab     Status: None   Collection Time: 12/31/19  3:30 AM   Specimen: Nasopharyngeal Swab  Result Value Ref Range Status   SARS Coronavirus 2 NEGATIVE NEGATIVE Final    Comment: (NOTE) SARS-CoV-2 target nucleic acids are NOT DETECTED.  The SARS-CoV-2 RNA is generally detectable in upper and lower respiratory specimens during the acute phase of infection. The lowest concentration of SARS-CoV-2 viral copies this assay can detect is 250 copies / mL. A negative result does not preclude SARS-CoV-2 infection and should not be used  as the sole basis for treatment or other patient management decisions.  A negative result may occur with improper specimen collection / handling, submission of specimen other than nasopharyngeal swab, presence of viral mutation(s) within the areas targeted by this assay, and inadequate number of viral copies (<250 copies / mL). A negative result must be combined with clinical observations, patient history, and epidemiological information.  Fact Sheet for Patients:   StrictlyIdeas.no  Fact Sheet for Healthcare Providers: BankingDealers.co.za  This test is not yet approved or  cleared by the [place] FDA and has been authorized for detection and/or diagnosis of SARS-CoV-2 by FDA under an Emergency Use Authorization (EUA).  This EUA will remain in effect (meaning this test can be used) for the duration of the COVID-19 declaration under Section 564(b)(1) of the Act, 21 U.S.C. section 360bbb-3(b)(1), unless the authorization is terminated or revoked sooner.  Performed at [facility], [place] [address], [place],  [address]       Studies: No results found.  Scheduled Meds: . sodium chloride   Intravenous Once  . bupivacaine liposome  20 mL Infiltration Once  . docusate sodium  100 mg Oral BID  . enoxaparin (LOVENOX) injection  40 mg Subcutaneous Q24H  . feeding supplement (GLUCERNA SHAKE)  237 mL Oral TID BM  . ferrous sulfate  325 mg Oral Q breakfast  . insulin aspart  0-9 Units Subcutaneous TID WC  . levothyroxine  88 mcg Oral QAC breakfast  . pravastatin  40 mg Oral Daily  . Ensure Max Protein  11 oz Oral Daily  . vortioxetine HBr  10 mg Oral Daily    Continuous Infusions: . methocarbamol (ROBAXIN) IV       LOS: 4 days     Kayleen Memos, MD [facility] Pager [phone]  If 7PM-7AM, please contact night-coverage [email or web address] Password [facility] 01/04/2020, 2:57 PM

## 2020-01-04 NOTE — Progress Notes (Signed)
Patient is 4 days s/p left THA and nonop treatment of left distal radius fracture. No events. Has done 1 session of PT so far. Patient stable from ortho stand point. Needs a couple more sessions of PT. Platform weight bearing LUE. WBAT LLE. Follow up in office in 2 weeks.  Azucena Cecil, MD [facility] [phone] 9:25 AM

## 2020-01-04 NOTE — Progress Notes (Signed)
Physical Therapy Treatment Patient Details Name: [name] MRN: [id number] DOB: 1940-12-05 Today's Date: 01/04/2020    History of Present Illness Pt is a 79 year old woman admitted after fall resulting in L distal radius and L hip fractures on 12/31/19. Pt underwent ORIF of L wrist and L THA, anterior approach on the day of admission.  PMH: R anterior THA May 2021 in which pt went to rehab in SNF, lung ca, DM, CAD, CHF,  brain aneurysm, anxiety, depression. Pt uses 2L 02 at baseline.    PT Comments    Patient progressing well towards PT goals. Improved ambulation distance today with Min A for balance/safety. Noted to have left knee instability but no buckling. Sp02 dropped to mid 80s on RA during gait and limited with distance due to dyspnea; donned 2L and able to increase distance with Sp02 dropping to 88%. Pt with edema in left digits, encouraged AROM and improved positioning with pillow/towel. Tolerated there ex as well. Encouraged OOB to chair as much as tolerated. Will follow.     Follow Up Recommendations  SNF     Equipment Recommendations  None recommended by PT    Recommendations for Other Services       Precautions / Restrictions Precautions Precautions: Fall Restrictions Weight Bearing Restrictions: Yes LUE Weight Bearing:  (WB thru elbow) LLE Weight Bearing: Weight bearing as tolerated    Mobility  Bed Mobility Overal bed mobility: Needs Assistance Bed Mobility: Supine to Sit;Rolling Rolling: Min assist   Supine to sit: Mod assist;HOB elevated;+2 for physical assistance     General bed mobility comments: increased time, cues to sequence, assist with LLE and to raise trunk, able to push through right heel to scoot bottom towards EOB with cues; rolling towards left x2 with assist.  Transfers Overall transfer level: Needs assistance Equipment used: Left platform walker Transfers: Sit to/from Stand Sit to Stand: Mod assist;+2 physical assistance          General transfer comment: assist to rise and steady, for placement of L UE on/off platform and to guide walker.  Ambulation/Gait Ambulation/Gait assistance: Min assist;+2 safety/equipment Gait Distance (Feet): 6 Feet (+ 12') Assistive device: Left platform walker Gait Pattern/deviations: Step-to pattern;Step-through pattern;Decreased stride length Gait velocity: decreased   General Gait Details: Slow, mildly unsteady gait with left knee instability but no buckling. 2/4 DOE. Initially tried to walk without 02 and not able to tolerate due to dyspnea, Sp02 dropped to mid 80s, walked with 02 and able to increase distance, dropped to 88% on 2L/min 02 [place]. 1 seated rest break.   Stairs             Wheelchair Mobility    Modified Rankin (Stroke Patients Only)       Balance Overall balance assessment: Needs assistance Sitting-balance support: Feet supported;No upper extremity supported Sitting balance-Leahy Scale: Fair     Standing balance support: During functional activity Standing balance-Leahy Scale: Poor Standing balance comment: requires B UE and external support                            Cognition Arousal/Alertness: Awake/alert Behavior During Therapy: WFL for tasks assessed/performed Overall Cognitive Status: Impaired/Different from baseline Area of Impairment: Memory;Following commands;Safety/judgement;Problem solving                     Memory: Decreased short-term memory Following Commands: Follows one step commands with increased time Safety/Judgement: Decreased awareness of  safety;Decreased awareness of deficits   Problem Solving: Slow processing;Difficulty sequencing;Requires verbal cues        Exercises General Exercises - Lower Extremity Ankle Circles/Pumps: AROM;Both;10 reps;Supine Quad Sets: AROM;Both;10 reps;Supine    General Comments General comments (skin integrity, edema, etc.): Daughter present during session.       Pertinent Vitals/Pain Pain Assessment: Faces Faces Pain Scale: Hurts a little bit Pain Location: left hip Pain Descriptors / Indicators: Grimacing;Guarding;Sore Pain Intervention(s): Monitored during session;Repositioned    Home Living                      Prior Function            PT Goals (current goals can now be found in the care plan section) Progress towards PT goals: Progressing toward goals    Frequency    Min 5X/week      PT Plan Current plan remains appropriate    Co-evaluation              AM-PAC PT "6 Clicks" Mobility   Outcome Measure  Help needed turning from your back to your side while in a flat bed without using bedrails?: A Little Help needed moving from lying on your back to sitting on the side of a flat bed without using bedrails?: A Lot Help needed moving to and from a bed to a chair (including a wheelchair)?: A Lot Help needed standing up from a chair using your arms (e.g., wheelchair or bedside chair)?: A Lot Help needed to walk in hospital room?: A Little Help needed climbing 3-5 steps with a railing? : Total 6 Click Score: 13    End of Session Equipment Utilized During Treatment: Gait belt Activity Tolerance: Patient tolerated treatment well Patient left: in chair;with call bell/phone within reach;with chair alarm set;with family/visitor present Nurse Communication: Mobility status PT Visit Diagnosis: Pain;Difficulty in walking, not elsewhere classified (R26.2);History of falling (Z91.81);Muscle weakness (generalized) (M62.81);Unsteadiness on feet (R26.81) Pain - Right/Left: Left Pain - part of body: Hip     Time: 7782-4235 PT Time Calculation (min) (ACUTE ONLY): 37 min  Charges:  $Gait Training: 8-22 mins $Therapeutic Activity: 8-22 mins                     Marisa Severin, PT, DPT Acute Rehabilitation Services Pager [phone] Office [phone]       [place] 01/04/2020, 1:25 PM

## 2020-01-05 ENCOUNTER — Encounter (HOSPITAL_COMMUNITY): Payer: Self-pay | Admitting: Orthopaedic Surgery

## 2020-01-05 DIAGNOSIS — S52502A Unspecified fracture of the lower end of left radius, initial encounter for closed fracture: Secondary | ICD-10-CM | POA: Diagnosis not present

## 2020-01-05 LAB — COMPREHENSIVE METABOLIC PANEL
ALT: 9 U/L (ref 0–44)
AST: 21 U/L (ref 15–41)
Albumin: 1.7 g/dL — ABNORMAL LOW (ref 3.5–5.0)
Alkaline Phosphatase: 93 U/L (ref 38–126)
Anion gap: 7 (ref 5–15)
BUN: 7 mg/dL — ABNORMAL LOW (ref 8–23)
CO2: 31 mmol/L (ref 22–32)
Calcium: 8.9 mg/dL (ref 8.9–10.3)
Chloride: 95 mmol/L — ABNORMAL LOW (ref 98–111)
Creatinine, Ser: 0.42 mg/dL — ABNORMAL LOW (ref 0.44–1.00)
GFR calc Af Amer: >60 mL/min (ref >60)
GFR calc non Af Amer: >60 mL/min (ref >60)
Glucose, Bld: 129 mg/dL — ABNORMAL HIGH (ref 70–99)
Potassium: 3.3 mmol/L — ABNORMAL LOW (ref 3.5–5.1)
Sodium: 133 mmol/L — ABNORMAL LOW (ref 135–145)
Total Bilirubin: 0.5 mg/dL (ref 0.3–1.2)
Total Protein: 6.2 g/dL — ABNORMAL LOW (ref 6.5–8.1)

## 2020-01-05 LAB — CBC WITH DIFFERENTIAL/PLATELET
Abs Immature Granulocytes: 0.03 10*3/uL (ref 0.00–0.07)
Basophils Absolute: 0 10*3/uL (ref 0.0–0.1)
Basophils Relative: 0 %
Eosinophils Absolute: 0.3 10*3/uL (ref 0.0–0.5)
Eosinophils Relative: 4 %
HCT: 25.5 % — ABNORMAL LOW (ref 36.0–46.0)
Hemoglobin: 7.8 g/dL — ABNORMAL LOW (ref 12.0–15.0)
Immature Granulocytes: 0 %
Lymphocytes Relative: 22 %
Lymphs Abs: 1.6 10*3/uL (ref 0.7–4.0)
MCH: 27.2 pg (ref 26.0–34.0)
MCHC: 30.6 g/dL (ref 30.0–36.0)
MCV: 88.9 fL (ref 80.0–100.0)
Monocytes Absolute: 0.5 10*3/uL (ref 0.1–1.0)
Monocytes Relative: 7 %
Neutro Abs: 4.9 10*3/uL (ref 1.7–7.7)
Neutrophils Relative %: 67 %
Platelets: 259 10*3/uL (ref 150–400)
RBC: 2.87 MIL/uL — ABNORMAL LOW (ref 3.87–5.11)
RDW: 14.9 % (ref 11.5–15.5)
WBC: 7.3 10*3/uL (ref 4.0–10.5)
nRBC: 0 % (ref 0.0–0.2)

## 2020-01-05 LAB — PROCALCITONIN: Procalcitonin: 0.18 ng/mL

## 2020-01-05 LAB — MAGNESIUM: Magnesium: 1.5 mg/dL — ABNORMAL LOW (ref 1.7–2.4)

## 2020-01-05 LAB — GLUCOSE, CAPILLARY
Glucose-Capillary: 135 mg/dL — ABNORMAL HIGH (ref 70–99)
Glucose-Capillary: 285 mg/dL — ABNORMAL HIGH (ref 70–99)
Glucose-Capillary: 301 mg/dL — ABNORMAL HIGH (ref 70–99)
Glucose-Capillary: 237 mg/dL — ABNORMAL HIGH (ref 70–99)

## 2020-01-05 LAB — PHOSPHORUS: Phosphorus: 2.7 mg/dL (ref 2.5–4.6)

## 2020-01-05 MED ORDER — IPRATROPIUM-ALBUTEROL 0.5-2.5 (3) MG/3ML IN SOLN
3.0000 mL | Freq: Four times a day (QID) | RESPIRATORY_TRACT | Status: DC
  Administered 2020-01-05 (×3): 3 mL via RESPIRATORY_TRACT
  Filled 2020-01-05 (×3): qty 3

## 2020-01-05 MED ORDER — POTASSIUM CHLORIDE CRYS ER 20 MEQ PO TBCR
40.0000 meq | EXTENDED_RELEASE_TABLET | Freq: Two times a day (BID) | ORAL | Status: AC
  Administered 2020-01-05 (×2): 40 meq via ORAL
  Filled 2020-01-05 (×2): qty 2

## 2020-01-05 MED ORDER — FERROUS SULFATE 325 (65 FE) MG PO TABS
325.0000 mg | ORAL_TABLET | Freq: Two times a day (BID) | ORAL | Status: DC
  Administered 2020-01-05 – 2020-01-10 (×11): 325 mg via ORAL
  Filled 2020-01-05 (×11): qty 1

## 2020-01-05 MED ORDER — METHYLPREDNISOLONE SODIUM SUCC 125 MG IJ SOLR
40.0000 mg | Freq: Every day | INTRAMUSCULAR | Status: DC
  Administered 2020-01-05 – 2020-01-06 (×2): 40 mg via INTRAVENOUS
  Filled 2020-01-05 (×2): qty 2

## 2020-01-05 MED ORDER — CODEINE SULFATE 15 MG PO TABS
30.0000 mg | ORAL_TABLET | ORAL | Status: AC | PRN
  Administered 2020-01-05: 30 mg via ORAL
  Filled 2020-01-05: qty 2

## 2020-01-05 MED ORDER — ADULT MULTIVITAMIN W/MINERALS CH
1.0000 | ORAL_TABLET | Freq: Every day | ORAL | Status: DC
  Administered 2020-01-05 – 2020-01-10 (×6): 1 via ORAL
  Filled 2020-01-05 (×6): qty 1

## 2020-01-05 MED ORDER — MAGNESIUM SULFATE 4 GM/100ML IV SOLN
4.0000 g | Freq: Once | INTRAVENOUS | Status: AC
  Administered 2020-01-05: 4 g via INTRAVENOUS
  Filled 2020-01-05: qty 100

## 2020-01-05 MED ORDER — IPRATROPIUM-ALBUTEROL 0.5-2.5 (3) MG/3ML IN SOLN
3.0000 mL | Freq: Three times a day (TID) | RESPIRATORY_TRACT | Status: DC
  Administered 2020-01-06 (×2): 3 mL via RESPIRATORY_TRACT
  Filled 2020-01-05 (×2): qty 3

## 2020-01-05 NOTE — Progress Notes (Signed)
PROGRESS NOTE  [name] HFW:[id number] DOB: Sep 10, 1940 DOA: 12/31/2019 PCP: [name], MD  HPI/Recap of past 24 hours: This is a 79 year old female with past medical history of chronic hypoxic respiratory failure with ILD on 2 L [place] at baseline follows with Dr. 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 [name]) who presented to [facility] ED on 12/31/19 after a fall from ground-level without LOC.  She subsequently had left hip and left wrist pain.   CT scan notable for left femoral neck angulated comminuted fracture and acute left styloid ulnar fracture with suspected chronic left radius fracture.  Wrist was splinted in the ED.  Orthopedic surgery was consulted as well as Dr. Claudia Desanctis, plastic surgery who recommended CT scan of left wrist to clarify fracture details and acuity. She was transferred to [facility] and on 12/31/19 had left total hip arthroplasty, closed reduction left distal radius fracture by Dr. [name].  LUE was evaluated by Dr. Marcelino Scot with plan for non operative management.  Hospital course complicated by intractable cough for which codeine tablets 30 mg q4h PRN and Gabapentin were started.  IV Solu-Medrol 40 mg daily x 5 days was also started due to wheezing on exam.  01/05/20: Seen and examined.  She feels better with the above regimen.  Son has many questions regarding prognosis and treatment of her non-small cell lung carcinoma, reached out to Dr. [name] to discuss with family. Will be happy to discuss again after discharge.  Has appointment on 01/18/20.  Reached out to office of Dr. Estanislado Pandy due to large brain aneurysm.  Per his PA, Louk, Alexis via phone, the patient will be seen by Dr. Norma Fredrickson outpatient.   Assessment/Plan: Active Problems:   Fracture of femoral neck, left, closed (HCC)    Protein-calorie malnutrition, severe   Closed fracture of left distal radius   Closed fracture of left wrist  Left hip fracture and left wrist fracture secondary to mechanical fall from ground height s/p left total hip arthroplasty, closed reduction left distal radius fracture on 12/31/2019 by Dr. [name]. a. Seen by Orthopedic and plastic surgery b. Pain control in place. c. PT recommended SNF. d. TOC assisting with SNF placement. e. LUE evaluated by Dr. Marcelino Scot.  Plan for non operative management f. Continue to work with PT by abiding to ortho's recommendations.  Acute, resolved, on chronic hypoxic respiratory failure in setting of ILD and known stage IIIB squamous cell lung cancer a. Suspect component of atelectasis, O2 saturation and requirement back to baseline b. On 2 L/min at baseline with O2 saturation of 100% on 2L. c. CXR with known LUL mass and chronic ILD, follows with pulmonary outpatient d. Continue incentive spirometer e. Obtain home O2 eval when close to discharge  Intractable cough likely secondary to her underlying lung malignancy Started codeine 30 mg q4H PRN x 3 days, gabapentin 100 mg TID, and IV solumedrol 40 mg daily x 5 days. Cough better on above regimen.  Continue to closely monitor Continue continuous pulse oximetry  Large 11 mm saccular aneurysm which appears inseparable from the R ICA terminus (12/08/19) Plan is to follow up with Dr. Debbrah Alar outpatient                    Reached out to office of Dr. Estanislado Pandy due to large brain aneurysm.  Per his                     PA, Louk, Alexis, via phone, the patient will be seen by Dr. Norma Fredrickson                                    outpatient.  Chronic normocytic anemia likely from chronic illness a. Drop in Hg 7.8 from 8.6 b. Continue home iron supplement 325 mg BID c. Obtain iron studies.  Stage IIIc non-small cell lung cancer, squamous cell carcinoma a. Follows with Dr. Julien Nordmann, keep appointment. b. Reached out to  Dr. [name] to discuss with family. States he will be happy to discuss again after discharge.  Has appointment on 01/18/20.   Prediabetes a. HA1C 5.8 on 12/31/2019 b. Avoid hypoglycemia.  Hypothyroidism  last TSH 0.363 on 11/05/2019 Continue levothyroxine  Ambulatory dysfunction post orthopedic surgery Continue PT OT with recommendations from orthopedic surgery Plan is to discharge to SNF for rehab Continue fall precaution   DVT prophylaxis: SQ Lovenox daily Code Status: Full Code   Family Communication:  Updated her son in the room    Consultants   Orthopedic surgery  Plastic surgery  Procedures  Left total hip arthroplasty, closed reduction left distal radius fracture on 12/31/2019 by Dr. [name].   Status is: Inpatient    Dispo: The patient is from: Home               Anticipated d/c is to:SNF              Anticipated d/c date is: 01/07/20 pending clinical improvement, needs improvement of her cough, needs more PT sessions as recommended by ortho, needs SNF placement.        Objective: Vitals:   01/04/20 2119 01/05/20 0745 01/05/20 0837 01/05/20 1011  BP:  (!) 112/52    Pulse: 103 82    Resp: 20 18    Temp:  98.2 F (36.8 C)    TempSrc:  Oral    SpO2: 100% 100% 94% 97%  Weight:      Height:       No intake or output data in the 24 hours ending 01/05/20 1247 Filed Weights   12/31/19 0316 12/31/19 1442  Weight: 60.3 kg 62.6 kg    Exam:  . General: 79 y.o. year-old female pleasant well-developed well-nourished in no acute distress.  Alert and oriented x3.   . Cardiovascular: Regular rate and rhythm no rubs or gallops.   [name] Respiratory: Diffuse wheezing bilaterally.  Poor inspiratory effort.   . Abdomen: Soft nontender normal bowel sounds present.   . Musculoskeletal: No lower extremity edema bilaterally.   [name] Psychiatry: Mood is appropriate for condition and setting.  Data Reviewed: CBC: Recent Labs  Lab 12/31/19 0330 01/01/20 0201 01/02/20 0302  01/03/20 0255 01/05/20 0257  WBC 8.4 8.4 9.3 8.9 7.3  NEUTROABS 6.6  --   --   --  4.9  HGB 8.1* 8.7* 8.9* 8.6* 7.8*  HCT 27.0* 27.7* 28.7* 27.7* 25.5*  MCV 91.8 89.9 90.3 88.8 88.9  PLT 300 261 260 276 378   Basic Metabolic Panel: Recent Labs  Lab 12/31/19 0330 01/01/20 0201 01/02/20 0302 01/05/20 0257  NA 138 135 133* 133*  K 3.7 3.4* 3.8 3.3*  CL 104 99 98 95*  CO2 26 28 29 31   GLUCOSE 130* 196* 186* 129*  BUN 9 6*  6* 7*  CREATININE 0.50 0.43* 0.53 0.42*  CALCIUM 9.4 9.6 8.8* 8.9  MG  --   --   --  1.5*  PHOS  --   --   --  2.7   GFR: Estimated Creatinine Clearance: 52.2 mL/min (A) (by C-G formula based on SCr of 0.42 mg/dL (L)). Liver Function Tests: Recent Labs  Lab 12/31/19 0330 01/01/20 0201 01/05/20 0257  AST 30 20 21   ALT 9 13 9   ALKPHOS 58 53 93  BILITOT 0.8 0.5 0.5  PROT 6.7 6.0* 6.2*  ALBUMIN 2.4* 1.9* 1.7*   No results for input(s): LIPASE, AMYLASE in the last 168 hours. No results for input(s): AMMONIA in the last 168 hours. Coagulation Profile: Recent Labs  Lab 12/31/19 0330  INR 1.2   Cardiac Enzymes: No results for input(s): CKTOTAL, CKMB, CKMBINDEX, TROPONINI in the last 168 hours. BNP (last 3 results) No results for input(s): PROBNP in the last 8760 hours. HbA1C: No results for input(s): HGBA1C in the last 72 hours. CBG: Recent Labs  Lab 01/04/20 1131 01/04/20 1615 01/04/20 2114 01/05/20 0644 01/05/20 1147  GLUCAP 111* 130* 156* 135* 285*   Lipid Profile: No results for input(s): CHOL, HDL, LDLCALC, TRIG, CHOLHDL, LDLDIRECT in the last 72 hours. Thyroid Function Tests: No results for input(s): TSH, T4TOTAL, FREET4, T3FREE, THYROIDAB in the last 72 hours. Anemia Panel: No results for input(s): VITAMINB12, FOLATE, FERRITIN, TIBC, IRON, RETICCTPCT in the last 72 hours. Urine analysis:    Component Value Date/Time   COLORURINE YELLOW 05/13/2018 2053   APPEARANCEUR CLEAR 05/13/2018 2053   LABSPEC 1.016 05/13/2018 2053    PHURINE 6.0 05/13/2018 2053   GLUCOSEU NEGATIVE 05/13/2018 2053   HGBUR SMALL (A) 05/13/2018 2053   BILIRUBINUR NEGATIVE 05/13/2018 2053   [place] NEGATIVE 05/13/2018 2053   PROTEINUR 30 (A) 05/13/2018 2053   NITRITE NEGATIVE 05/13/2018 2053   LEUKOCYTESUR TRACE (A) 05/13/2018 2053   Sepsis Labs: @LABRCNTIP (procalcitonin:4,lacticidven:4)  ) Recent Results (from the past 240 hour(s))  SARS Coronavirus 2 by RT PCR (hospital order, performed in [facility] hospital lab) Nasopharyngeal Nasopharyngeal Swab     Status: None   Collection Time: 12/31/19  3:30 AM   Specimen: Nasopharyngeal Swab  Result Value Ref Range Status   SARS Coronavirus 2 NEGATIVE NEGATIVE Final    Comment: (NOTE) SARS-CoV-2 target nucleic acids are NOT DETECTED.  The SARS-CoV-2 RNA is generally detectable in upper and lower respiratory specimens during the acute phase of infection. The lowest concentration of SARS-CoV-2 viral copies this assay can detect is 250 copies / mL. A negative result does not preclude SARS-CoV-2 infection and should not be used as the sole basis for treatment or other patient management decisions.  A negative result may occur with improper specimen collection / handling, submission of specimen other than nasopharyngeal swab, presence of viral mutation(s) within the areas targeted by this assay, and inadequate number of viral copies (<250 copies / mL). A negative result must be combined with clinical observations, patient history, and epidemiological information.  Fact Sheet for Patients:   StrictlyIdeas.no  Fact Sheet for Healthcare Providers: BankingDealers.co.za  This test is not yet approved or  cleared by the [place] FDA and has been authorized for detection and/or diagnosis of SARS-CoV-2 by FDA under an Emergency Use Authorization (EUA).  This EUA will remain in effect (meaning this test can be used) for the duration of  the COVID-19 declaration under Section 564(b)(1) of the Act, 21 U.S.C. section 360bbb-3(b)(1), unless the  authorization is terminated or revoked sooner.  Performed at [facility], [place] [address] [place], [place] [address]       Studies: No results found.  Scheduled Meds: . sodium chloride   Intravenous Once  . bupivacaine liposome  20 mL Infiltration Once  . dextromethorphan-guaiFENesin  2 tablet Oral BID  . docusate sodium  100 mg Oral BID  . enoxaparin (LOVENOX) injection  40 mg Subcutaneous Q24H  . feeding supplement (GLUCERNA SHAKE)  237 mL Oral TID BM  . ferrous sulfate  325 mg Oral BID WC  . gabapentin  100 mg Oral TID  . insulin aspart  0-9 Units Subcutaneous TID WC  . ipratropium-albuterol  3 mL Nebulization Q6H  . levothyroxine  88 mcg Oral QAC breakfast  . methylPREDNISolone (SOLU-MEDROL) injection  40 mg Intravenous Daily  . multivitamin with minerals  1 tablet Oral Daily  . potassium chloride  40 mEq Oral BID  . pravastatin  40 mg Oral Daily  . sodium chloride HYPERTONIC  4 mL Nebulization BID  . vortioxetine HBr  10 mg Oral Daily    Continuous Infusions: . methocarbamol (ROBAXIN) IV       LOS: 5 days     Kayleen Memos, MD [facility] Pager [phone]  If 7PM-7AM, please contact night-coverage [email or web address] Password [facility] 01/05/2020, 12:47 PM

## 2020-01-05 NOTE — Progress Notes (Signed)
NIR.  Received [place] from Dr. [place] Crane regarding management of unruptured right ICA terminus aneurysm.  Chart check revealed plans for [facility] consult with Dr. Karenann Cai on outpatient basis (unscheduled at this time, per notes attempt to call patient to schedule x2). Dr. Karenann Cai is out of office for remainder of week- at this time, continue with current plan for management of aneurysm on outpatient basis. Discussed with Dr. [place] Crane who states patient to remain in house for some time. Will discuss case with Dr. Karenann Cai upon her return- at that time, if patient still in house, will re-discuss management options of aneurysm (inpatient vs outpatient management).  Please call NIR with questions/concerns.   Bea Graff Mora Pedraza, PA-C 01/05/2020, 1:14 PM

## 2020-01-05 NOTE — Progress Notes (Signed)
Physical Therapy Treatment Patient Details Name: [name] MRN: [id number] DOB: [birth year] Today's Date: 01/05/2020    History of Present Illness Pt is a 79 year old woman admitted after fall resulting in L distal radius and L hip fractures on 12/31/19. Pt underwent ORIF of L wrist and L THA, anterior approach on the day of admission.  PMH: R anterior THA May 2021 in which pt went to rehab in SNF, lung ca, DM, CAD, CHF,  brain aneurysm, anxiety, depression. Pt uses 2L 02 at baseline.    PT Comments    Pt  Continues to progress with mobility though fatigues very quickly and needs standing and seated rest breaks. Pt kept on 2L O2 throughout mobility today, experienced 2/4 DOE but sats remained ~95%. Pt ambulated 8', 7', 8' with PFRW and min A with seated rest breaks in between. L knee buckling end of last ambulation. Pt finished session in recliner. PT will continue to follow.   Follow Up Recommendations  SNF     Equipment Recommendations  None recommended by PT    Recommendations for Other Services       Precautions / Restrictions Precautions Precautions: Fall Required Braces or Orthoses: Sling Restrictions Weight Bearing Restrictions: Yes LUE Weight Bearing: Weight bear through elbow only LLE Weight Bearing: Weight bearing as tolerated    Mobility  Bed Mobility Overal bed mobility: Needs Assistance Bed Mobility: Supine to Sit     Supine to sit: HOB elevated;Mod assist     General bed mobility comments: increased time needed, encouraged pt to try to come to edge on her own and pt able to achieve partially. Mod A for elevation of trunk into sitting and scooting L hip fwd to EOB since pt does not have use of RUE  Transfers Overall transfer level: Needs assistance Equipment used: Left platform walker Transfers: Sit to/from Stand Sit to Stand: Mod assist         General transfer comment: mod A from bed, recliner, and toilet. Last stand from recliner pt needed max A  due to fatigue if LE's  Ambulation/Gait Ambulation/Gait assistance: Min assist;+2 safety/equipment Gait Distance (Feet): 23 Feet (8', 7', 8') Assistive device: Left platform walker Gait Pattern/deviations: Step-through pattern;Decreased stride length Gait velocity: decreased Gait velocity interpretation: <1.31 ft/sec, indicative of household ambulator General Gait Details: pt needed frequent rest breaks due to DOE, kept on 2L O2 with sats 95%. L knee began to buckle with 3rd bout of ambulation   Stairs             Wheelchair Mobility    Modified Rankin (Stroke Patients Only)       Balance Overall balance assessment: Needs assistance Sitting-balance support: Feet supported;No upper extremity supported Sitting balance-Leahy Scale: Fair Sitting balance - Comments: posterior lean, but no overt LOB   Standing balance support: During functional activity;Single extremity supported Standing balance-Leahy Scale: Poor Standing balance comment: requires B UE and external support                            Cognition Arousal/Alertness: Awake/alert Behavior During Therapy: WFL for tasks assessed/performed Overall Cognitive Status: Impaired/Different from baseline Area of Impairment: Memory;Following commands;Safety/judgement;Problem solving                   Current Attention Level: Sustained Memory: Decreased short-term memory Following Commands: Follows one step commands with increased time Safety/Judgement: Decreased awareness of safety;Decreased awareness of deficits   Problem  Solving: Slow processing;Difficulty sequencing;Requires verbal cues;Decreased initiation General Comments: pt needs much encouragement. Son gives her a lot of encouragement but she sometimes gets frustrated with him.      Exercises General Exercises - Lower Extremity Ankle Circles/Pumps: AROM;Both;10 reps;Supine Quad Sets: AROM;Both;10 reps;Supine Gluteal Sets: AROM;Both;10  reps;Supine Heel Slides: AROM;Left;10 reps;Supine Hip ABduction/ADduction: AROM;Left;10 reps;Seated Straight Leg Raises: AAROM;Left;10 reps;Supine    General Comments General comments (skin integrity, edema, etc.): pt given handout of LE ther ex and ice for L hip      Pertinent Vitals/Pain Pain Assessment: Faces Faces Pain Scale: Hurts little more Pain Location: left hip Pain Descriptors / Indicators: Grimacing;Guarding;Sore Pain Intervention(s): Limited activity within patient's tolerance;Monitored during session    Home Living                      Prior Function            PT Goals (current goals can now be found in the care plan section) Acute Rehab PT Goals Patient Stated Goal: pt would like to use BSC PT Goal Formulation: With patient Time For Goal Achievement: 01/15/20 Potential to Achieve Goals: Good Progress towards PT goals: Progressing toward goals    Frequency    Min 5X/week      PT Plan Current plan remains appropriate    Co-evaluation              AM-PAC PT "6 Clicks" Mobility   Outcome Measure  Help needed turning from your back to your side while in a flat bed without using bedrails?: A Little Help needed moving from lying on your back to sitting on the side of a flat bed without using bedrails?: A Lot Help needed moving to and from a bed to a chair (including a wheelchair)?: A Lot Help needed standing up from a chair using your arms (e.g., wheelchair or bedside chair)?: A Lot Help needed to walk in hospital room?: A Little Help needed climbing 3-5 steps with a railing? : Total 6 Click Score: 13    End of Session Equipment Utilized During Treatment: Gait belt;Oxygen Activity Tolerance: Patient tolerated treatment well Patient left: in chair;with call bell/phone within reach;with chair alarm set;with family/visitor present Nurse Communication: Mobility status PT Visit Diagnosis: Pain;Difficulty in walking, not elsewhere classified  (R26.2);History of falling (Z91.81);Muscle weakness (generalized) (M62.81);Unsteadiness on feet (R26.81) Pain - Right/Left: Left Pain - part of body: Hip     Time: 3009-2330 PT Time Calculation (min) (ACUTE ONLY): 44 min  Charges:  $Gait Training: 23-37 mins $Therapeutic Exercise: 8-22 mins                     [place]  Pager [phone] Office [place] 01/05/2020, 1:38 PM

## 2020-01-05 NOTE — TOC Progression Note (Signed)
Transition of Care [facility]) - Progression Note    Patient Details  Name: [name] MRN: [id number] Date of Birth: May 25, 1941  Transition of Care [facility]) CM/SW [place], [place] Phone Number: 01/05/2020, 1:51 PM  Clinical Narrative:     CSW spoke to pts son [name] in pts room. CSW provided [name] and pt the list of accepted SNF's. Adam stated he will review them with His sister and get back with CSW. Pt stated she has been to [facility] in the past but states he does not want to go back there.    10:00am- CSW called pts daughter Juliann Pulse, there was no answer, csw left message.     Expected Discharge Plan: [place] Barriers to Discharge: Continued Medical Work up, Ship broker  Expected Discharge Plan and Services Expected Discharge Plan: [place]   Discharge Planning Services: CM Consult Post Acute Care Choice: [place] Living arrangements for the past 2 months: Single Family Home Expected Discharge Date: 01/02/20               DME Arranged:  (Home O2 at 2L/min, WC, RW, 3:1)           HH Agency:  (Active with [facility] in [place])         Social Determinants of Health ([place]) Interventions    Readmission Risk Interventions Readmission Risk Prevention Plan 01/01/2020  Transportation Screening Complete  PCP or Specialist Appt within 3-5 Days Complete  HRI or [place] Complete  Social Work Consult for [place] Planning/Counseling Complete  Palliative Care Screening Complete  Medication Review Press photographer) Complete  Some recent data might be hidden   Emeterio Reeve, Latanya Presser, [place] Social Worker [phone]

## 2020-01-05 NOTE — Progress Notes (Signed)
Nutrition Follow-up  RD working remotely.  DOCUMENTATION CODES:   Severe malnutrition in context of chronic illness  INTERVENTION:   -Continue Glucerna Shake po TID, each supplement provides 220 kcal and 10 grams of protein -D/c Ensure Max -MVI with minerals daily -Magic cup TID with meals, each supplement provides 290 kcal and 9 grams of protein -Continue liberalized diet of regular  NUTRITION DIAGNOSIS:   Severe Malnutrition related to chronic illness, cancer and cancer related treatments, hip fracture as evidenced by percent weight loss, energy intake < or equal to 75% for > or equal to 1 month, moderate fat depletion, severe muscle depletion.  Ongoing  GOAL:   Patient will meet greater than or equal to 90% of their needs  Progressing   MONITOR:   Diet advancement, Labs, Weight trends, I & O's  REASON FOR ASSESSMENT:   Consult Hip fracture protocol  ASSESSMENT:   : 79 yo female who reports a ground level fall today at home.  She says she got up to go to the bathroom and started falling sideways. She complains of immediate hip pain on the left. She had her right hip replaced in May. She is also dealing with Stage III lung cancer (squamous). Admited for left hip fracture.  7/22- s/p lt total hip replacement and ORIF of lt distal radius 7/25- per orthopedics notes, plan for non-operative management of lt wrist  Attempted to speak with pt via hospital room phone, however, no answer.   No meal completion records available at this time. Pt is consuming Glucerna supplements, but refusing Ensure Max.   Medications reviewed and include mucinex, colace, lovenox, ferrous sulfate, neurontin, and solu-medrol.   Per TOC team notes, plan to d/c to SNF once medically stable and bed is available.   Labs reviewed: Na: 133, K: 3.3, CBGS: 130-156 (inpatient orders for glycemic control are 0-9 units insulin aspart TID with meals).   Diet Order:   Diet Order            Diet  regular Room service appropriate? Yes; Fluid consistency: Thin  Diet effective now                 EDUCATION NEEDS:   Education needs have been addressed  Skin:  Skin Assessment: Skin Integrity Issues: Skin Integrity Issues:: Incisions Incisions: lt hip, lt arm  Last BM:  01/02/20  Height:   Ht Readings from Last 1 Encounters:  12/31/19 5\' 5"  (1.651 m)    Weight:   Wt Readings from Last 1 Encounters:  12/31/19 62.6 kg    Ideal Body Weight:     BMI:  Body mass index is 22.96 kg/m.  Estimated Nutritional Needs:   Kcal:  1800-2000  Protein:  95-110 grams  Fluid:  > 1.8 L    Loistine Chance, RD, LDN, [place] Registered Dietitian II Certified Diabetes Care and Education Specialist Please refer to [facility] for RD and/or RD on-call/weekend/after hours pager

## 2020-01-06 ENCOUNTER — Inpatient Hospital Stay: Payer: Medicare Other

## 2020-01-06 DIAGNOSIS — S52532A Colles' fracture of left radius, initial encounter for closed fracture: Secondary | ICD-10-CM

## 2020-01-06 LAB — GLUCOSE, CAPILLARY
Glucose-Capillary: 144 mg/dL — ABNORMAL HIGH (ref 70–99)
Glucose-Capillary: 348 mg/dL — ABNORMAL HIGH (ref 70–99)
Glucose-Capillary: 304 mg/dL — ABNORMAL HIGH (ref 70–99)
Glucose-Capillary: 181 mg/dL — ABNORMAL HIGH (ref 70–99)

## 2020-01-06 LAB — BASIC METABOLIC PANEL
Anion gap: 8 (ref 5–15)
BUN: 11 mg/dL (ref 8–23)
CO2: 31 mmol/L (ref 22–32)
Calcium: 9 mg/dL (ref 8.9–10.3)
Chloride: 97 mmol/L — ABNORMAL LOW (ref 98–111)
Creatinine, Ser: 0.51 mg/dL (ref 0.44–1.00)
GFR calc Af Amer: >60 mL/min (ref >60)
GFR calc non Af Amer: >60 mL/min (ref >60)
Glucose, Bld: 190 mg/dL — ABNORMAL HIGH (ref 70–99)
Potassium: 4.8 mmol/L (ref 3.5–5.1)
Sodium: 136 mmol/L (ref 135–145)

## 2020-01-06 LAB — CBC
HCT: 24.7 % — ABNORMAL LOW (ref 36.0–46.0)
Hemoglobin: 7.6 g/dL — ABNORMAL LOW (ref 12.0–15.0)
MCH: 27.3 pg (ref 26.0–34.0)
MCHC: 30.8 g/dL (ref 30.0–36.0)
MCV: 88.8 fL (ref 80.0–100.0)
Platelets: 289 10*3/uL (ref 150–400)
RBC: 2.78 MIL/uL — ABNORMAL LOW (ref 3.87–5.11)
RDW: 14.7 % (ref 11.5–15.5)
WBC: 8.1 10*3/uL (ref 4.0–10.5)
nRBC: 0 % (ref 0.0–0.2)

## 2020-01-06 LAB — IRON AND TIBC
Iron: 21 ug/dL — ABNORMAL LOW (ref 28–170)
Saturation Ratios: 10 % — ABNORMAL LOW (ref 10.4–31.8)
TIBC: 203 ug/dL — ABNORMAL LOW (ref 250–450)
UIBC: 182 ug/dL

## 2020-01-06 LAB — VITAMIN B12: Vitamin B-12: 739 pg/mL (ref 180–914)

## 2020-01-06 LAB — FOLATE: Folate: 15.4 ng/mL (ref >5.9)

## 2020-01-06 LAB — FERRITIN: Ferritin: 117 ng/mL (ref 11–307)

## 2020-01-06 MED ORDER — SODIUM CHLORIDE 0.9 % IV SOLN
510.0000 mg | Freq: Once | INTRAVENOUS | Status: AC
  Administered 2020-01-06: 510 mg via INTRAVENOUS
  Filled 2020-01-06: qty 17

## 2020-01-06 MED ORDER — INSULIN GLARGINE 100 UNIT/ML ~~LOC~~ SOLN
7.0000 [IU] | Freq: Every day | SUBCUTANEOUS | Status: DC
  Administered 2020-01-06 – 2020-01-07 (×2): 7 [IU] via SUBCUTANEOUS
  Filled 2020-01-06 (×2): qty 0.07

## 2020-01-06 MED ORDER — PREDNISONE 20 MG PO TABS
20.0000 mg | ORAL_TABLET | Freq: Every day | ORAL | Status: DC
  Administered 2020-01-07 – 2020-01-10 (×4): 20 mg via ORAL
  Filled 2020-01-06 (×4): qty 1

## 2020-01-06 NOTE — Progress Notes (Signed)
Occupational Therapy Treatment Patient Details Name: [name] MRN: [id number] DOB: [birth year] Today's Date: 01/06/2020    History of present illness Pt is a 79 y.o. female admitted 12/31/19 after fall sustaining L distal radius fx and L hip fxs. S/p L wrist ORIF and L THA (direct ant approach) on 7/22. PMH includes R anterior THA (10/2019 - pt went to rehab at SNF), lung CA, DM, CAD, CHF, brain aneurysm, anxiety, depression.   OT comments  Pt continues to be limited by pain in L hip. Pt education for fit/wear schedule for sling for LUE. Pt cooperative and wanting to participate today. Pt's daughter in room pt performing pericare in standing at commode with minA and sat for grooming at sink with set-upA. Pt progressing well with mobility minA overall for stability with RW. Pt would benefit from continued OT skilled services. OT following.   O2 >90% on 3L and bumped up to 4L when pt reporting to be claustrophobic in bathroom.     Follow Up Recommendations  SNF;Supervision/Assistance - 24 hour    Equipment Recommendations  None recommended by OT    Recommendations for Other Services      Precautions / Restrictions Precautions Precautions: Fall;Other (comment) Precaution Comments: Watch SpO2 (wears 2L O2 baseline) Required Braces or Orthoses: Sling Restrictions Weight Bearing Restrictions: Yes LUE Weight Bearing: Weight bear through elbow only LLE Weight Bearing: Weight bearing as tolerated       Mobility Bed Mobility Overal bed mobility: Needs Assistance Bed Mobility: Sit to Supine       Sit to supine: Supervision   General bed mobility comments: Increased time and effort, no physical assist to manage LLE; use of bed rail  Transfers Overall transfer level: Needs assistance Equipment used: Left platform walker Transfers: Sit to/from Stand Sit to Stand: Min assist         General transfer comment: MinA for stability    Balance Overall balance assessment: Needs  assistance Sitting-balance support: Feet supported;No upper extremity supported Sitting balance-Leahy Scale: Fair       Standing balance-Leahy Scale: Poor Standing balance comment: Reliant on UE support                           ADL either performed or assessed with clinical judgement   ADL Overall ADL's : Needs assistance/impaired     Grooming: Set up;Sitting;Wash/dry hands;Wash/dry face               Lower Body Dressing: Moderate assistance;Cueing for safety;Sitting/lateral leans;Sit to/from stand Lower Body Dressing Details (indicate cue type and reason): fixing briefs Toilet Transfer: Minimal assistance;+2 for physical assistance;Ambulation;RW;BSC Toilet Transfer Details (indicate cue type and reason): BSC over commode Toileting- Clothing Manipulation and Hygiene: Minimal assistance;Sitting/lateral lean;Sit to/from stand Toileting - Clothing Manipulation Details (indicate cue type and reason): Pt performing own pericare seated first and then in standing     Functional mobility during ADLs: Minimal assistance;Rolling walker;Cueing for safety General ADL Comments: Pt continues to be limited by pain in L hip. Pt education for fit/wear schedule for sling for LUE. Pt cooperative and wanting to participate today. Pt's daughter in room     Vision       Perception     Praxis      Cognition Arousal/Alertness: Awake/alert Behavior During Therapy: WFL for tasks assessed/performed Overall Cognitive Status: Impaired/Different from baseline Area of Impairment: Memory;Following commands;Safety/judgement;Problem solving;Attention;Awareness  Current Attention Level: Selective Memory: Decreased short-term memory Following Commands: Follows one step commands consistently;Follows multi-step commands inconsistently Safety/Judgement: Decreased awareness of deficits Awareness: Emergent Problem Solving: Requires verbal cues General Comments: Pt  alert and participative this session        Exercises General Exercises - Lower Extremity Ankle Circles/Pumps: AROM;Both;Supine Long Arc Quad: AROM;Both;Seated   Shoulder Instructions       General Comments Pt's daughter in room.    Pertinent Vitals/ Pain       Pain Assessment: Faces Faces Pain Scale: Hurts little more Pain Location: left hip Pain Descriptors / Indicators: Guarding;Sore;Tiring Pain Intervention(s): Monitored during session  Home Living                                          Prior Functioning/Environment              Frequency  Min 2X/week        Progress Toward Goals  OT Goals(current goals can now be found in the care plan section)  Progress towards OT goals: Progressing toward goals  Acute Rehab OT Goals Patient Stated Goal: pt would like to use BSC consistently OT Goal Formulation: With patient Time For Goal Achievement: 01/15/20 Potential to Achieve Goals: Good ADL Goals Pt Will Perform Grooming: with min assist;sitting Pt Will Perform Upper Body Bathing: with min assist;sitting Pt Will Perform Upper Body Dressing: with min assist;sitting Pt Will Transfer to Toilet: with mod assist;ambulating;bedside commode Additional ADL Goal #1: Pt will perform bed mobility with moderate assistance in preparation for ADL.  Plan Discharge plan remains appropriate    Co-evaluation                 AM-PAC OT "6 Clicks" Daily Activity     Outcome Measure   Help from another person eating meals?: A Little Help from another person taking care of personal grooming?: A Little Help from another person toileting, which includes using toliet, bedpan, or urinal?: A Little Help from another person bathing (including washing, rinsing, drying)?: A Lot Help from another person to put on and taking off regular upper body clothing?: A Little Help from another person to put on and taking off regular lower body clothing?: A Lot 6 Click  Score: 16    End of Session Equipment Utilized During Treatment: Gait belt;Rolling walker;Oxygen  OT Visit Diagnosis: Unsteadiness on feet (R26.81);Other abnormalities of gait and mobility (R26.89);Pain;Muscle weakness (generalized) (M62.81);Other symptoms and signs involving cognitive function Pain - Right/Left: Left Pain - part of body: Hip   Activity Tolerance Patient tolerated treatment well   Patient Left in chair;with call bell/phone within reach;with chair alarm set;with family/visitor present   Nurse Communication Mobility status        Time: 4270-6237 OT Time Calculation (min): 45 min  Charges: OT General Charges $OT Visit: 1 Visit OT Treatments $Self Care/Home Management : 23-37 mins $Therapeutic Activity: 8-22 mins  Jefferey Pica, OTR/L Acute Rehabilitation Services Pager: [phone] Office: [phone]    Nur Krasinski C 01/06/2020, 5:30 PM

## 2020-01-06 NOTE — Progress Notes (Signed)
Physical Therapy Treatment Patient Details Name: [name] MRN: [id number] DOB: 1940-09-10 Today's Date: 01/06/2020    History of Present Illness Pt is a 79 y.o. female admitted 12/31/19 after fall sustaining L distal radius fx and L hip fxs. S/p L wrist ORIF and L THA (direct ant approach) on 7/22. PMH includes R anterior THA (10/2019 - pt went to rehab at SNF), lung CA, DM, CAD, CHF, brain aneurysm, anxiety, depression.   PT Comments    Pt progressing well with mobility, motivated to participate. Today's session focused on transfer and gait training with LUE platform RW. Pt requires up to [facility] for mobility. SpO2 88-96% on 2L O2 [place] with activity. Pt remains limited by generalized weakness, impaired balance and decreased activity tolerance; requires frequent rest breaks with activity. Daughter present and supportive; currently researching SNFs.    Follow Up Recommendations  SNF;Supervision for mobility/OOB     Equipment Recommendations  None recommended by PT    Recommendations for Other Services       Precautions / Restrictions Precautions Precautions: Fall;Other (comment) Precaution Comments: Watch SpO2 (wears 2L O2 baseline) Restrictions Weight Bearing Restrictions: Yes LUE Weight Bearing: Weight bear through elbow only LLE Weight Bearing: Weight bearing as tolerated    Mobility  Bed Mobility Overal bed mobility: Needs Assistance Bed Mobility: Sit to Supine       Sit to supine: Supervision   General bed mobility comments: Increased time and effort, no physical assist to manage LLE; use of bed rail  Transfers Overall transfer level: Needs assistance Equipment used: Left platform walker Transfers: Sit to/from Stand Sit to Stand: Mod assist         General transfer comment: Performed multiple sit<>stands during session; requires consistent modA for trunk elevation, as well as assist to place LUE on platform; frequent cues for sequencing, improving  carryover  Ambulation/Gait Ambulation/Gait assistance: Min assist Gait Distance (Feet): 36 Feet (+36) Assistive device: Left platform walker Gait Pattern/deviations: Step-through pattern;Decreased stride length;Trunk flexed Gait velocity: decreased   General Gait Details: Slow, mildly unsteady gait with platform RW and intermittent minA for stability. Amb 54' + 36' with prolonged seated rest between gait trials. SpO2 down to 86% on RA; 88-96% on 2L O2 [place]. Frequent cues for upright posture. Assist for managing walker with turns   Stairs             Wheelchair Mobility    Modified Rankin (Stroke Patients Only)       Balance Overall balance assessment: Needs assistance Sitting-balance support: Feet supported;No upper extremity supported Sitting balance-Leahy Scale: Fair       Standing balance-Leahy Scale: Poor Standing balance comment: Reliant on UE support                            Cognition Arousal/Alertness: Awake/alert Behavior During Therapy: WFL for tasks assessed/performed Overall Cognitive Status: Impaired/Different from baseline Area of Impairment: Memory;Following commands;Safety/judgement;Problem solving;Attention;Awareness                   Current Attention Level: Selective Memory: Decreased short-term memory Following Commands: Follows one step commands consistently;Follows multi-step commands inconsistently Safety/Judgement: Decreased awareness of deficits Awareness: Emergent Problem Solving: Requires verbal cues        Exercises General Exercises - Lower Extremity Ankle Circles/Pumps: AROM;Both;Supine Long Arc Quad: AROM;Both;Seated    General Comments        Pertinent Vitals/Pain Pain Assessment: Faces Faces Pain Scale: Hurts little more  Pain Location: left hip Pain Descriptors / Indicators: Guarding;Sore;Tiring Pain Intervention(s): Monitored during session    Home Living                      Prior  Function            PT Goals (current goals can now be found in the care plan section) Progress towards PT goals: Progressing toward goals    Frequency    Min 5X/week      PT Plan Current plan remains appropriate    Co-evaluation              AM-PAC PT "6 Clicks" Mobility   Outcome Measure  Help needed turning from your back to your side while in a flat bed without using bedrails?: A Little Help needed moving from lying on your back to sitting on the side of a flat bed without using bedrails?: A Little Help needed moving to and from a bed to a chair (including a wheelchair)?: A Lot Help needed standing up from a chair using your arms (e.g., wheelchair or bedside chair)?: A Lot Help needed to walk in hospital room?: A Little Help needed climbing 3-5 steps with a railing? : Total 6 Click Score: 14    End of Session Equipment Utilized During Treatment: Gait belt;Oxygen Activity Tolerance: Patient tolerated treatment well Patient left: in bed;with call bell/phone within reach;with family/visitor present;with bed alarm set Nurse Communication: Mobility status PT Visit Diagnosis: Pain;Difficulty in walking, not elsewhere classified (R26.2);History of falling (Z91.81);Muscle weakness (generalized) (M62.81);Unsteadiness on feet (R26.81) Pain - Right/Left: Left Pain - part of body: Hip     Time: 3709-6438 PT Time Calculation (min) (ACUTE ONLY): 34 min  Charges:  $Gait Training: 8-22 mins $Therapeutic Activity: 8-22 mins                     Mabeline Caras, PT, DPT Acute Rehabilitation Services  Pager [phone] Office [place] 01/06/2020, 3:45 PM

## 2020-01-06 NOTE — Progress Notes (Signed)
PROGRESS NOTE    [name]  KDX:[id number] DOB: 1940-10-02 DOA: 12/31/2019 PCP: [name], MD  Brief Narrative:  23 white female prior 45-pack-year smoker currently home Prior to this admission Pulmonary fibrosis/ILD (with autoimmune features-cannot use immunosuppressants given new cancer diagnosis) previously managed by Dr. [name] lost to follow-up until 2016 currently [facility] with [name]ology Dr. [name] Last visit 12/28/2019 found to have pulmonary nodule Bronchoscopy 11/13/2019 = SQ CC LUL-to be followed by Dr. Harley [place] ISS-and has nothing for XRT scheduled 01/15/2020 Diabetes mellitus type 2, hypothyroid, hyperlipidemia, recent blood loss anemia from surgery Sustained hip fracture 11/03/2019 discharged to [facility] rehab- Readmitted 12/31/2019 with ground-level fall acute styloid ulnar fracture, left femoral neck angulated comminuted fracture Ortho/plastic surgery consulted Underwent surgery for hip 7/22-and closed reduction with manipulation of left distal wrist fracture with sugar tong splinting by Dr. [name]   Assessment & Plan:   Active Problems:   Fracture of femoral neck, left, closed (HCC)   Protein-calorie malnutrition, severe   Closed fracture of left distal radius   Closed fracture of left wrist   1. Left total hip replacement after fall + left distal radius closed reduction a. 2-week follow-up in office per Dr.Xu b. Will need skilled placement TOC consulted and the options debated with family c. They have some concerns about patient going to skilled facility as she did not "do well"-son pulled me aside subsequently and was concerned about may be patient going to outpatient rehab where she would probably interact more d. I clarified with the treating therapist who states that patient is now currently on the level of supervision where she can get outpatient therapy 2. 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 We will coordinate outpatient planning with  neuro interventional radiology tomorrow b. Will need possible intervention once recovers from surgery 3. Squamous cell CA following at [facility] a. Patient is to have mapping apparently per patient's son on above date b. Hopefully patient will be discharged prior to this c. Dr. [name] is aware of the patient we will CC plan d. I will also CC [name] who is a oncology navigator to ensure patient is not lost follow-up 4. 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  intractable cough a. Outpatient management at [facility] pulmonology as above b. Patient was placed on codeine 30 mg every 4 as needed and gabapentin 100 3 times daily with guaifenesin and hypertonic saline nebs twice daily she can continue duo nebs in addition although I do not hear wheezes c. I will taper off rapidly her Solu-Medrol given hyperglycemia to prednisone 6. DM TY A1c 5.8 a.  at this time holding Actos 15 2 b. CBGs ranging from 340 348 but received Solu-Medrol for apparent cough c. Add Lantus 7 units today and monitor trends 7. Hypothyroid a. Needs outpatient TSH continuing at this time Synthroid 88 mcg 8. Hip fracture 11/03/2019 9. 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 hemoglobin down-it appears she received transfusion earlier in admission b. Transfusion threshold not met but iron studies show significant very low saturations therefore Feraheme administered 729  DVT prophylaxis: Lovenox Lovenox Code Status: Full code Family Communication: They have concerns about how to coordinate saccular aneurysm neuro interventional radiology follow-up in addition to how to coordinate new diagnosis of cancer follow-up Per their discussion with me it appears Dr. [name] is okay with radiation therapy {not chemo until healing of wounds] I will CC Dr. [name] so that he is aware  15-minute discussion with  both daughter and son is at bedside today  disposition:   Status is: Inpatient  Remains inpatient appropriate  because:Hemodynamically unstable and IV treatments appropriate due to intensity of illness or inability to take PO   Dispo:  Patient From: Home  Planned Disposition: [place]  Expected discharge date: 01/08/2020  Medically stable for discharge: No        Consultants:   Orthopedics  Plastic surgery  Procedures: As above none current  Antimicrobials: None current   Subjective: Awake quite anxious pain is moderately controlled she is however not able to really get up and mobilize well given fractures in both upper and lower extremity on the same side Many questions about whether she can go home See above discussion She remains on oxygen  Objective: Vitals:   01/06/20 0521 01/06/20 0800 01/06/20 0845 01/06/20 1500  BP: (!) 121/64  (!) 106/44 (!) 110/63  Pulse: 84  86 85  Resp: 15  15 16   Temp:   98.2 F (36.8 C) 98.2 F (36.8 C)  TempSrc:   Oral Oral  SpO2: 100% 99% 100% 98%  Weight:      Height:       No intake or output data in the 24 hours ending 01/06/20 1815 Filed Weights   12/31/19 0316 12/31/19 1442  Weight: 60.3 kg 62.6 kg    Examination:  General exam: EOMI NCAT no focaldeficit neck soft supple Respiratory system: Clinically clear no rales no rhonchi Cardiovascular system: S1-S2 Gastrointestinal system: Soft nontender no rebound no guarding no organomegaly  Central nervous system: Neurologically intact no focal deficit Extremities: Left lower extremities externally rotated with bandages over wounds  left upper extremity is bandaged abundantly with Coban and reinforce dressing Skin: No edema Psychiatry: Euthymic and pleasant  Data Reviewed: I have personally reviewed following labs and imaging studies BUN/creatinine 11/2.5 Iron is 20 saturation ratio is 10 Hemoglobin is down from 8.9 at its peak to 7.6 MCV is 88 platelet 259  Radiology Studies: No results found.   Scheduled Meds: . sodium chloride   Intravenous Once  .  bupivacaine liposome  20 mL Infiltration Once  . dextromethorphan-guaiFENesin  2 tablet Oral BID  . docusate sodium  100 mg Oral BID  . enoxaparin (LOVENOX) injection  40 mg Subcutaneous Q24H  . feeding supplement (GLUCERNA SHAKE)  237 mL Oral TID BM  . ferrous sulfate  325 mg Oral BID WC  . gabapentin  100 mg Oral TID  . insulin aspart  0-9 Units Subcutaneous TID WC  . ipratropium-albuterol  3 mL Nebulization TID  . levothyroxine  88 mcg Oral QAC breakfast  . methylPREDNISolone (SOLU-MEDROL) injection  40 mg Intravenous Daily  . multivitamin with minerals  1 tablet Oral Daily  . pravastatin  40 mg Oral Daily  . sodium chloride HYPERTONIC  4 mL Nebulization BID  . vortioxetine HBr  10 mg Oral Daily   Continuous Infusions: . methocarbamol (ROBAXIN) IV       LOS: 6 days    Time spent: [place], MD [facility] To contact the attending provider between 7A-7P or the covering provider during after hours 7P-7A, please log into the web site [email or web address] and access using universal [place] password for that web site. If you do not have the password, please call the hospital operator.  01/06/2020, 6:15 PM

## 2020-01-07 ENCOUNTER — Inpatient Hospital Stay (HOSPITAL_COMMUNITY): Payer: Medicare Other

## 2020-01-07 DIAGNOSIS — S52532A Colles' fracture of left radius, initial encounter for closed fracture: Secondary | ICD-10-CM | POA: Diagnosis not present

## 2020-01-07 LAB — BASIC METABOLIC PANEL
Anion gap: 9 (ref 5–15)
BUN: 12 mg/dL (ref 8–23)
CO2: 29 mmol/L (ref 22–32)
Calcium: 9.1 mg/dL (ref 8.9–10.3)
Chloride: 99 mmol/L (ref 98–111)
Creatinine, Ser: 0.46 mg/dL (ref 0.44–1.00)
GFR calc Af Amer: >60 mL/min (ref >60)
GFR calc non Af Amer: >60 mL/min (ref >60)
Glucose, Bld: 149 mg/dL — ABNORMAL HIGH (ref 70–99)
Potassium: 4.7 mmol/L (ref 3.5–5.1)
Sodium: 137 mmol/L (ref 135–145)

## 2020-01-07 LAB — CBC WITH DIFFERENTIAL/PLATELET
Abs Immature Granulocytes: 0.11 10*3/uL — ABNORMAL HIGH (ref 0.00–0.07)
Basophils Absolute: 0 10*3/uL (ref 0.0–0.1)
Basophils Relative: 0 %
Eosinophils Absolute: 0.1 10*3/uL (ref 0.0–0.5)
Eosinophils Relative: 0 %
HCT: 25.8 % — ABNORMAL LOW (ref 36.0–46.0)
Hemoglobin: 8.1 g/dL — ABNORMAL LOW (ref 12.0–15.0)
Immature Granulocytes: 1 %
Lymphocytes Relative: 18 %
Lymphs Abs: 2.1 10*3/uL (ref 0.7–4.0)
MCH: 27.7 pg (ref 26.0–34.0)
MCHC: 31.4 g/dL (ref 30.0–36.0)
MCV: 88.4 fL (ref 80.0–100.0)
Monocytes Absolute: 0.7 10*3/uL (ref 0.1–1.0)
Monocytes Relative: 6 %
Neutro Abs: 8.8 10*3/uL — ABNORMAL HIGH (ref 1.7–7.7)
Neutrophils Relative %: 75 %
Platelets: 323 10*3/uL (ref 150–400)
RBC: 2.92 MIL/uL — ABNORMAL LOW (ref 3.87–5.11)
RDW: 15.2 % (ref 11.5–15.5)
WBC: 11.8 10*3/uL — ABNORMAL HIGH (ref 4.0–10.5)
nRBC: 0 % (ref 0.0–0.2)

## 2020-01-07 LAB — GLUCOSE, CAPILLARY
Glucose-Capillary: 107 mg/dL — ABNORMAL HIGH (ref 70–99)
Glucose-Capillary: 198 mg/dL — ABNORMAL HIGH (ref 70–99)
Glucose-Capillary: 204 mg/dL — ABNORMAL HIGH (ref 70–99)
Glucose-Capillary: 192 mg/dL — ABNORMAL HIGH (ref 70–99)

## 2020-01-07 MED ORDER — PIOGLITAZONE HCL 15 MG PO TABS
15.0000 mg | ORAL_TABLET | Freq: Every day | ORAL | Status: DC
  Administered 2020-01-07 – 2020-01-10 (×4): 15 mg via ORAL
  Filled 2020-01-07 (×4): qty 1

## 2020-01-07 MED ORDER — IPRATROPIUM-ALBUTEROL 0.5-2.5 (3) MG/3ML IN SOLN
3.0000 mL | Freq: Two times a day (BID) | RESPIRATORY_TRACT | Status: DC
  Administered 2020-01-07 – 2020-01-08 (×3): 3 mL via RESPIRATORY_TRACT
  Filled 2020-01-07 (×3): qty 3

## 2020-01-07 MED ORDER — IPRATROPIUM-ALBUTEROL 0.5-2.5 (3) MG/3ML IN SOLN
3.0000 mL | RESPIRATORY_TRACT | Status: DC | PRN
  Administered 2020-01-07: 3 mL via RESPIRATORY_TRACT
  Filled 2020-01-07: qty 3

## 2020-01-07 MED ORDER — INSULIN GLARGINE 100 UNIT/ML ~~LOC~~ SOLN
4.0000 [IU] | Freq: Every day | SUBCUTANEOUS | Status: DC
  Administered 2020-01-08 – 2020-01-10 (×3): 4 [IU] via SUBCUTANEOUS
  Filled 2020-01-07 (×3): qty 0.04

## 2020-01-07 MED ORDER — FUROSEMIDE 20 MG PO TABS
20.0000 mg | ORAL_TABLET | Freq: Once | ORAL | Status: AC
  Administered 2020-01-07: 20 mg via ORAL
  Filled 2020-01-07: qty 1

## 2020-01-07 NOTE — Plan of Care (Signed)
  Problem: Education: Goal: Knowledge of General Education information will improve Description: Including pain rating scale, medication(s)/side effects and non-pharmacologic comfort measures Outcome: Progressing   Problem: Health Behavior/Discharge Planning: Goal: Ability to manage health-related needs will improve Outcome: Progressing   Problem: Clinical Measurements: Goal: Ability to maintain clinical measurements within normal limits will improve Outcome: Progressing   Problem: Clinical Measurements: Goal: Will remain free from infection Outcome: Progressing   Problem: Clinical Measurements: Goal: Diagnostic test results will improve Outcome: Progressing   Problem: Clinical Measurements: Goal: Cardiovascular complication will be avoided Outcome: Progressing

## 2020-01-07 NOTE — TOC Progression Note (Addendum)
Transition of Care [facility]) - Progression Note    Patient Details  Name: SHARINE CADLE MRN: [id number] Date of Birth: [birth year]  Transition of Care [facility]) CM/SW Contact  Sharin Mons, RN Phone Number: [phone] 01/07/2020, 10:04 AM  Clinical Narrative:    NCM spoke with son [name] @ bedside regarding SNF bed offer with [facility]. Sons states unsure if they want to accept  Bed offer 2/2 to staff COVID case. States waiting to her hear back from sister before accepting offer. NCM has initiated insurance authorization ... authorization pending for SNF placement.  TOC team will continue to monitor....   Expected Discharge Plan: Skilled Nursing Facility Barriers to Discharge: Insurance Authorization  Expected Discharge Plan and Services Expected Discharge Plan: [place]   Discharge Planning Services: CM Consult Post Acute Care Choice: [place] Living arrangements for the past 2 months: Single Family Home Expected Discharge Date: 01/02/20               DME Arranged:  (Home O2 at 2L/min, WC, RW, 3:1)           HH Agency:  (Active with [facility] in [place])         Social Determinants of Health ([place]) Interventions    Readmission Risk Interventions Readmission Risk Prevention Plan 01/01/2020  Transportation Screening Complete  PCP or Specialist Appt within 3-5 Days Complete  HRI or [place] Complete  Social Work Consult for [place] Planning/Counseling Complete  Palliative Care Screening Complete  Medication Review [employer] photographer) Complete  Some recent data might be hidden

## 2020-01-07 NOTE — Plan of Care (Signed)

## 2020-01-07 NOTE — Progress Notes (Signed)
Patient with frequent calls  and requests for misplaced personal items. Two calls she stated " I don't know what I want".   Bath given, full linen change given. Noted patient with nasal congestion, Mucinex given (see Mar). Pt blowing nose to clear. Pt prefers to use the pickle  Instead of the incentive spirometry. LUE elevated on a pillow, Ice pack placed to L Hip. Pt refuses scheduled Neurontin. Repositioned patient for comfort and bed exit alarm placed. Call light and phone in reach.

## 2020-01-07 NOTE — Progress Notes (Signed)
Physical Therapy Treatment Patient Details Name: [name] MRN: [id number] DOB: 1941-01-01 Today's Date: 01/07/2020    History of Present Illness Pt is a 79 y.o. female admitted 12/31/19 after fall sustaining L distal radius fx and L hip fxs. S/p L wrist ORIF and L THA (direct ant approach) on 7/22. PMH includes R anterior THA (10/2019 - pt went to rehab at SNF), lung CA, DM, CAD, CHF, brain aneurysm, anxiety, depression.   PT Comments    Pt progressing with mobility. Today's session focused on transfer training from varying surface heights. Pt requires consistent modA to assist trunk elevation and balance. Reliant on external assist to maintain balance with standing ADL task; at high risk for falls. Pt self-limiting and anxious with increased WOB; responds well to encouragement and practicing diaphragmatic breathing. SpO2 >/90% on RA. Continue to recommend SNF-level therapies to maximize functional mobility and independence.    Follow Up Recommendations  SNF;Supervision for mobility/OOB     Equipment Recommendations   (defer)    Recommendations for Other Services       Precautions / Restrictions Precautions Precautions: Fall;Other (comment) Precaution Comments: Watch SpO2 (wears 2L O2 baseline) Required Braces or Orthoses: Sling Restrictions Weight Bearing Restrictions: Yes LUE Weight Bearing: Weight bear through elbow only LLE Weight Bearing: Weight bearing as tolerated    Mobility  Bed Mobility Overal bed mobility: Needs Assistance Bed Mobility: Supine to Sit;Sit to Supine     Supine to sit: Mod assist;HOB elevated Sit to supine: Min assist   General bed mobility comments: ModA to assist trunk elevation with RUE HHA; minA for LE management return to supine  Transfers Overall transfer level: Needs assistance Equipment used: 1 person hand held assist Transfers: Sit to/from Stand Sit to Stand: Mod assist;Max assist         General transfer comment: Performed  multiple sit<>stands from EOB and BSC with RUE HHA and modA, increased to maxA last trial due to fatigue  Ambulation/Gait Ambulation/Gait assistance: Mod assist Gait Distance (Feet): 4 Feet Assistive device: 1 person hand held assist Gait Pattern/deviations: Step-to pattern;Antalgic;Trunk flexed;Shuffle     General Gait Details: Steps from bed<>BSC with HHA and modA for balance   Stairs             Wheelchair Mobility    Modified Rankin (Stroke Patients Only)       Balance Overall balance assessment: Needs assistance Sitting-balance support: Feet supported;No upper extremity supported Sitting balance-Leahy Scale: Fair       Standing balance-Leahy Scale: Poor Standing balance comment: Reliant on UE support or external assist; able to perform posterior pericare without UE support, requiring minA for external support                            Cognition Arousal/Alertness: Awake/alert Behavior During Therapy: WFL for tasks assessed/performed Overall Cognitive Status: Impaired/Different from baseline Area of Impairment: Memory;Following commands;Safety/judgement;Problem solving;Attention;Awareness                   Current Attention Level: Sustained;Selective Memory: Decreased short-term memory Following Commands: Follows one step commands consistently;Follows multi-step commands inconsistently Safety/Judgement: Decreased awareness of deficits Awareness: Emergent Problem Solving: Requires verbal cues General Comments: Increased confusion; self-limiting      Exercises Other Exercises Other Exercises: Diaphragmatic breathing sitting at EOB    General Comments General comments (skin integrity, edema, etc.): SpO2 >90% on 1L O2 [place]. Son and daughter present at beginning and end of session  Pertinent Vitals/Pain Pain Assessment: Faces Faces Pain Scale: Hurts little more Pain Location: LUE > LLE Pain Descriptors / Indicators:  Guarding;Sore;Tiring Pain Intervention(s): Monitored during session    Home Living                      Prior Function            PT Goals (current goals can now be found in the care plan section) Progress towards PT goals: Progressing toward goals    Frequency    Min 3X/week      PT Plan Frequency needs to be updated    Co-evaluation              AM-PAC PT "6 Clicks" Mobility   Outcome Measure  Help needed turning from your back to your side while in a flat bed without using bedrails?: A Little Help needed moving from lying on your back to sitting on the side of a flat bed without using bedrails?: A Lot Help needed moving to and from a bed to a chair (including a wheelchair)?: A Lot Help needed standing up from a chair using your arms (e.g., wheelchair or bedside chair)?: A Lot Help needed to walk in hospital room?: A Little Help needed climbing 3-5 steps with a railing? : Total 6 Click Score: 13    End of Session Equipment Utilized During Treatment: Oxygen Activity Tolerance: Patient tolerated treatment well;Patient limited by fatigue Patient left: in bed;with call bell/phone within reach;with family/visitor present;with bed alarm set Nurse Communication: Mobility status PT Visit Diagnosis: Pain;Difficulty in walking, not elsewhere classified (R26.2);History of falling (Z91.81);Muscle weakness (generalized) (M62.81);Unsteadiness on feet (R26.81) Pain - Right/Left: Left Pain - part of body: Arm;Hip     Time: 7414-2395 PT Time Calculation (min) (ACUTE ONLY): 23 min  Charges:  $Therapeutic Activity: 23-37 mins                     Mabeline Caras, PT, DPT Acute Rehabilitation Services  Pager [phone] Office [place] 01/07/2020, 5:03 PM

## 2020-01-07 NOTE — Progress Notes (Signed)
PROGRESS NOTE    [name]  ZLD:[id number] DOB: 1941/05/28 DOA: 12/31/2019 PCP: [name], MD  Brief Narrative:  47 white female prior 45-pack-year smoker community dwelling resident Pulmonary fibrosis/ILD (with autoimmune features-cannot use immunosuppressants given new cancer diagnosis) previously managed by Dr. [name] lost to follow-up until 2016 currently follows with [facility] pulmonology Dr. [name] Last visit 12/28/2019 found to have pulmonary nodule Bronchoscopy 11/13/2019 = SQ CC LUL-to be followed by Dr. [name]  XRT scheduled 01/15/2020 Diabetes mellitus type 2, hypothyroid, hyperlipidemia, recent blood loss anemia from surgery Sustained hip fracture 11/03/2019 discharged to [facility] rehab- Readmitted 12/31/2019 with ground-level fall acute styloid ulnar fracture, left femoral neck angulated comminuted fracture Ortho/plastic surgery consulted Underwent surgery for hip 7/22-and closed reduction with manipulation of left distal wrist fracture with sugar tong splinting by Dr. [name]   Assessment & Plan:   Active Problems:   Fracture of femoral neck, left, closed (HCC)   Protein-calorie malnutrition, severe   Closed fracture of left distal radius   Closed fracture of left wrist   1. 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  intractable cough a. Outpatient management at [facility] pulmonology as above b. Patient was placed on codeine 30 mg every 4 as needed and gabapentin 100 3 times daily with guaifenesin and hypertonic saline nebs twice daily she can continue duo nebs in addition although I do not hear wheezes c. Previously Solu-Medrol currently on prednisone 20 daily d. She had paroxysms of coughing overnight 7/28 but is at 99% O2 sat on 2 L e. I did a chest x-ray 7/29 that per my over read shows minimal fluid and large mass on the left side as noted previously f. Hopefully will continue to recover and do well over the next day and a half or so and can probably discharge skilled 3. Left total hip  replacement after fall + left distal radius closed reduction a. 2-week follow-up in office per Dr.Xu WBAT LLE suture removal 8/4 at his office b. Awaiting skilled placement 4. 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 Discussed with Dr. Margurite Auerbach b. Per him intervention does not take precedence over treatment of cancer and recovery from multiple orthopedic procedures c. Dr. [name] will be made aware and will contact the patient-office number to call is [phone], [phone] if issues 5. Squamous cell CA following at [facility] a. Patient is to have mapping apparently per patient's son on above date b. Dr. [name] is aware of the patient we will CC plan c. I will also CC [name] who is a oncology navigator to ensure patient is not lost follow-up 6. DM TY A1c 5.8-not on insulin prior to admission a. Resume Actos 7/29 b. CBG much improved with de-escalation of steroids to 140s-190s c. Adjust Lantus downward to 4 units today 7. Hypothyroid a. Needs outpatient TSH continuing at this time Synthroid 88 mcg 8. Hip fracture 11/03/2019 9. 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 hemoglobin down-it appears she received transfusion earlier in admission b. Transfusion threshold not met but iron studies show significant very low saturations therefore Feraheme administered 729 c. No further work-up at this time  DVT prophylaxis: Lovenox Lovenox Code Status: Full code Family Communication: They have concerns about how to coordinate saccular aneurysm neuro interventional radiology follow-up in addition to how to coordinate new diagnosis of cancer follow-up Per their discussion with me it appears Dr. [name] is okay with radiation therapy {not chemo until healing of wounds] I will CC  Dr. [name] so that he is aware  15-minute discussion with both daughter and son is at bedside today  disposition:   Status is: Inpatient  Remains inpatient appropriate  because:Hemodynamically unstable and IV treatments appropriate due to intensity of illness or inability to take PO   Dispo:  Patient From: Home  Planned Disposition: [place]  Expected discharge date: 01/08/2020  Medically stable for discharge: No        Consultants:   Orthopedics  Plastic surgery  Procedures: As above none current  Antimicrobials: None current   Subjective: Anxious Had paroxysms of cough last night in addition to this morning but sats are good Long discussion with patient family at bedside Discussed chronic disease management and hopes to recover Anticipate she will need long course of steroids going forward  Objective: Vitals:   01/07/20 0941 01/07/20 0950 01/07/20 0956 01/07/20 1414  BP: (!) 119/42     Pulse: 79   83  Resp: 16   18  Temp: 97.9 F (36.6 C)     TempSrc: Oral     SpO2: 100% 94% 98% 99%  Weight:      Height:        Intake/Output Summary (Last 24 hours) at 01/07/2020 1434 Last data filed at 01/07/2020 0536 Gross per 24 hour  Intake 360 ml  Output --  Net 360 ml   Filed Weights   12/31/19 0316 12/31/19 1442 01/07/20 0400  Weight: 60.3 kg 62.6 kg 63.9 kg    Examination:  General exam: Frail EOMI NCAT Respiratory system: Clinically clear fine crackles Velcro-like sounds posterolaterally with no rales rhonchi no egophony Cardiovascular system: S1-S2 no murmur rub or gallop Gastrointestinal system: Soft nontender no rebound no guarding no organomegaly  Central nervous system: Neurologically intact no focal deficit Extremities: Left lower extremities externally rotated with bandages over wounds  left upper extremity is bandaged abundantly with Coban and reinforce dressing Skin: No edema Psychiatry: Euthymic but slightly anxious  Data Reviewed: I have personally reviewed following labs and imaging studies BUN/creatinine 12/0.4 Hemoglobin is down from 8.9 at its peak to 7.6-->8.1 MCV is 88 platelet  323  Radiology Studies: DG CHEST PORT 1 VIEW  Result Date: 01/07/2020 CLINICAL DATA:  Heart failure. EXAM: PORTABLE CHEST 1 VIEW COMPARISON:  01/02/2020, 05/13/2018.  CT chest 11/04/2019. FINDINGS: Mediastinum appears stable. Heart size appears stable. Venous congestion. Chronic interstitial changes are again noted bilaterally. Left upper lung prominent mass is again noted without interim change. No prominent pleural effusion. Biapical pleural thickening consistent scarring. No pneumothorax. No acute bony abnormality. IMPRESSION: 1.  Large left upper lung mass again noted without interim change. 2. Chronic interstitial lung disease again noted. 3. Heart size stable. No pulmonary venous congestion. Electronically Signed   By: [place]   On: 01/07/2020 12:16     Scheduled Meds: . sodium chloride   Intravenous Once  . bupivacaine liposome  20 mL Infiltration Once  . dextromethorphan-guaiFENesin  2 tablet Oral BID  . docusate sodium  100 mg Oral BID  . enoxaparin (LOVENOX) injection  40 mg Subcutaneous Q24H  . feeding supplement (GLUCERNA SHAKE)  237 mL Oral TID BM  . ferrous sulfate  325 mg Oral BID WC  . insulin aspart  0-9 Units Subcutaneous TID WC  . insulin glargine  7 Units Subcutaneous Daily  . ipratropium-albuterol  3 mL Nebulization BID  . levothyroxine  88 mcg Oral QAC breakfast  . multivitamin with minerals  1 tablet Oral Daily  .  pravastatin  40 mg Oral Daily  . predniSONE  20 mg Oral QAC breakfast  . sodium chloride HYPERTONIC  4 mL Nebulization BID  . vortioxetine HBr  10 mg Oral Daily   Continuous Infusions: . methocarbamol (ROBAXIN) IV       LOS: 7 days    Time spent: 35 minutes  Nita Sells, MD [facility] To contact the attending provider between 7A-7P or the covering provider during after hours 7P-7A, please log into the web site [email or web address] and access using universal [place] password for that web site. If you do not have the password,  please call the hospital operator.  01/07/2020, 2:34 PM

## 2020-01-08 ENCOUNTER — Encounter: Payer: Self-pay | Admitting: *Deleted

## 2020-01-08 ENCOUNTER — Inpatient Hospital Stay (HOSPITAL_COMMUNITY): Payer: Medicare Other

## 2020-01-08 DIAGNOSIS — R52 Pain, unspecified: Secondary | ICD-10-CM | POA: Diagnosis not present

## 2020-01-08 DIAGNOSIS — I5031 Acute diastolic (congestive) heart failure: Secondary | ICD-10-CM | POA: Diagnosis not present

## 2020-01-08 DIAGNOSIS — I484 Atypical atrial flutter: Secondary | ICD-10-CM

## 2020-01-08 DIAGNOSIS — I361 Nonrheumatic tricuspid (valve) insufficiency: Secondary | ICD-10-CM

## 2020-01-08 DIAGNOSIS — I480 Paroxysmal atrial fibrillation: Secondary | ICD-10-CM

## 2020-01-08 LAB — CBC WITH DIFFERENTIAL/PLATELET
Abs Immature Granulocytes: 0.08 10*3/uL — ABNORMAL HIGH (ref 0.00–0.07)
Basophils Absolute: 0.1 10*3/uL (ref 0.0–0.1)
Basophils Relative: 0 %
Eosinophils Absolute: 0.2 10*3/uL (ref 0.0–0.5)
Eosinophils Relative: 2 %
HCT: 28.1 % — ABNORMAL LOW (ref 36.0–46.0)
Hemoglobin: 8.5 g/dL — ABNORMAL LOW (ref 12.0–15.0)
Immature Granulocytes: 1 %
Lymphocytes Relative: 19 %
Lymphs Abs: 2.3 10*3/uL (ref 0.7–4.0)
MCH: 27.5 pg (ref 26.0–34.0)
MCHC: 30.2 g/dL (ref 30.0–36.0)
MCV: 90.9 fL (ref 80.0–100.0)
Monocytes Absolute: 0.8 10*3/uL (ref 0.1–1.0)
Monocytes Relative: 6 %
Neutro Abs: 8.4 10*3/uL — ABNORMAL HIGH (ref 1.7–7.7)
Neutrophils Relative %: 72 %
Platelets: 384 10*3/uL (ref 150–400)
RBC: 3.09 MIL/uL — ABNORMAL LOW (ref 3.87–5.11)
RDW: 15.6 % — ABNORMAL HIGH (ref 11.5–15.5)
WBC: 11.7 10*3/uL — ABNORMAL HIGH (ref 4.0–10.5)
nRBC: 0 % (ref 0.0–0.2)

## 2020-01-08 LAB — GLUCOSE, CAPILLARY
Glucose-Capillary: 138 mg/dL — ABNORMAL HIGH (ref 70–99)
Glucose-Capillary: 168 mg/dL — ABNORMAL HIGH (ref 70–99)
Glucose-Capillary: 188 mg/dL — ABNORMAL HIGH (ref 70–99)

## 2020-01-08 LAB — BASIC METABOLIC PANEL
Anion gap: 10 (ref 5–15)
BUN: 9 mg/dL (ref 8–23)
CO2: 31 mmol/L (ref 22–32)
Calcium: 10 mg/dL (ref 8.9–10.3)
Chloride: 96 mmol/L — ABNORMAL LOW (ref 98–111)
Creatinine, Ser: 0.45 mg/dL (ref 0.44–1.00)
GFR calc Af Amer: >60 mL/min (ref >60)
GFR calc non Af Amer: >60 mL/min (ref >60)
Glucose, Bld: 105 mg/dL — ABNORMAL HIGH (ref 70–99)
Potassium: 4 mmol/L (ref 3.5–5.1)
Sodium: 137 mmol/L (ref 135–145)

## 2020-01-08 LAB — ECHOCARDIOGRAM LIMITED
Height: 65 in
S' Lateral: 2.1 cm
Weight: 2253.98 oz

## 2020-01-08 LAB — MAGNESIUM: Magnesium: 1.9 mg/dL (ref 1.7–2.4)

## 2020-01-08 LAB — TROPONIN I (HIGH SENSITIVITY): Troponin I (High Sensitivity): 11 ng/L (ref <18)

## 2020-01-08 LAB — TSH: TSH: 1.524 u[IU]/mL (ref 0.350–4.500)

## 2020-01-08 MED ORDER — METOPROLOL TARTRATE 5 MG/5ML IV SOLN
5.0000 mg | Freq: Four times a day (QID) | INTRAVENOUS | Status: DC
  Administered 2020-01-08: 5 mg via INTRAVENOUS
  Filled 2020-01-08 (×3): qty 5

## 2020-01-08 MED ORDER — METOPROLOL TARTRATE 12.5 MG HALF TABLET
12.5000 mg | ORAL_TABLET | Freq: Two times a day (BID) | ORAL | Status: DC
  Administered 2020-01-08 – 2020-01-10 (×5): 12.5 mg via ORAL
  Filled 2020-01-08 (×6): qty 1

## 2020-01-08 MED ORDER — LEVALBUTEROL HCL 0.63 MG/3ML IN NEBU
0.6300 mg | INHALATION_SOLUTION | Freq: Two times a day (BID) | RESPIRATORY_TRACT | Status: DC
  Administered 2020-01-09: 0.63 mg via RESPIRATORY_TRACT
  Filled 2020-01-08 (×2): qty 3

## 2020-01-08 MED ORDER — LEVALBUTEROL HCL 0.63 MG/3ML IN NEBU
0.6300 mg | INHALATION_SOLUTION | Freq: Four times a day (QID) | RESPIRATORY_TRACT | Status: DC
  Administered 2020-01-08: 0.63 mg via RESPIRATORY_TRACT
  Filled 2020-01-08: qty 3

## 2020-01-08 MED ORDER — VERAPAMIL HCL 120 MG PO TABS: 60.0000 mg | ORAL_TABLET | Freq: Two times a day (BID) | ORAL | Status: DC

## 2020-01-08 MED ORDER — UMECLIDINIUM BROMIDE 62.5 MCG/INH IN AEPB
1.0000 | INHALATION_SPRAY | Freq: Every day | RESPIRATORY_TRACT | Status: DC
  Administered 2020-01-09: 1 via RESPIRATORY_TRACT
  Filled 2020-01-08: qty 7

## 2020-01-08 MED ORDER — ALPRAZOLAM 0.5 MG PO TABS
0.5000 mg | ORAL_TABLET | Freq: Three times a day (TID) | ORAL | Status: DC | PRN
  Administered 2020-01-08 – 2020-01-10 (×2): 0.5 mg via ORAL
  Filled 2020-01-08 (×2): qty 1

## 2020-01-08 NOTE — Progress Notes (Signed)
PROGRESS NOTE    [name]  RCB:[id number] DOB: 01-01-1941 DOA: 12/31/2019 PCP: [name], MD  Brief Narrative:  62 white female prior 45-pack-year smoker community dwelling resident Pulmonary fibrosis/ILD (with autoimmune features-cannot use immunosuppressants given new cancer diagnosis) previously managed by Dr. [name] lost to follow-up until 2016 currently follows with [facility] pulmonology Dr. [name] Last visit 12/28/2019 found to have pulmonary nodule Bronchoscopy 11/13/2019 = SQ CC LUL-to be followed by Dr. [name]  XRT scheduled 01/15/2020 Diabetes mellitus type 2, hypothyroid, hyperlipidemia, recent blood loss anemia from surgery Sustained hip fracture 11/03/2019 discharged to [facility] rehab- Readmitted 12/31/2019 with ground-level fall acute styloid ulnar fracture, left femoral neck angulated comminuted fracture Ortho/plastic surgery consulted Underwent surgery for hip 7/22-and closed reduction with manipulation of left distal wrist fracture with sugar tong splinting by Dr. [name]   Assessment & Plan:   Active Problems:   Fracture of femoral neck, left, closed (HCC)   Protein-calorie malnutrition, severe   Closed fracture of left distal radius   Closed fracture of left wrist   1. Atrial flutter/SVT CHADS2 score 4 a. Seems to have occurred sometime overnight 7/29-feeling the fluttering in her chest and symptomatic therefore given metoprolol IV x1 and started metoprolol twice daily 12.5 b. Not a good candidate from my perspective for cardioversion given advanced lung disease c. Consider anticoagulation in the next several days d. Echo does not seem overly concerning e. Magnesium/TSH also seem relatively benign 2. 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  intractable cough a. Outpatient management at [facility] pulmonology as above b. Patient was placed on codeine 30 mg every 4 as needed and gabapentin 100 3 times daily with guaifenesin and hypertonic saline nebs twice daily  c. Switch DuoNeb to  Xopenex given tachycardia today and add tiotropium instead of ipratropium d. Continue prednisone 20 daily e. I did a chest x-ray 7/29 that per my over read shows minimal fluid and large mass on the left side as noted previously 4. Left total hip replacement after fall + left distal radius closed reduction a. 2-week follow-up in office per Dr.Xu WBAT LLE suture removal 8/4 at his office b. Awaiting skilled placement 5. 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 Discussed with Dr. [name] other medical issues take precedence over this b. Dr. [name] will be made aware and will contact the patient-office number to call is [phone], [phone] if issues 6. Squamous cell CA following at [facility] a. Patient is to have mapping apparently per patient's son on above date b. Dr. [name] is aware of the patient we will CC plan c. I will also CC [name] who is a oncology navigator to ensure patient is not lost follow-up-appreciate input thank you 7. DM TY A1c 5.8-not on insulin prior to admission a. Resume Actos 7/29 b. CBG much improved with de-escalation of steroids = 1 30-1 60 c. Continue Lantus 4 units 8. Hypothyroid a. Needs outpatient TSH continuing at this time Synthroid 88 mcg 9. Hip fracture 11/03/2019 10. 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 hemoglobin down-it appears she received transfusion earlier in admission b. Transfusion threshold not met but iron studies show significant very low saturations therefore Feraheme administered 729 c. No further work-up at this time  DVT prophylaxis: Lovenox Lovenox Code Status: Full code Family Communication: Long discussion with daughter at bedside explained she has tachycardia All questions answered Monitor trends including labs in the morning hopeful for some rate control do not anticipate patient will  be hemodynamically stable for the next 1 to 2 days prior to transfer to rehab   disposition:   Status is: Inpatient  Remains inpatient appropriate because:Hemodynamically unstable and IV treatments appropriate due to intensity of illness or inability to take PO   Dispo:  Patient From: Home  Planned Disposition: [place]  Expected discharge date: 01/08/2020  Medically stable for discharge: No        Consultants:   Orthopedics  Plastic surgery  Cardiology  Procedures: As above none current  Antimicrobials: None current   Subjective: Anxious since last night and feeling very very uncomfortable Heart rate in the 150s at the bedside Seems to be sinus tach alternating with flutter 2:1 Given this is a nontelemetry floor we did not opt to do sinus massage or adenosine   Objective: Vitals:   01/08/20 1300 01/08/20 1306 01/08/20 1310 01/08/20 1428  BP:   (!) 111/61 (!) 107/55  Pulse: (!) 147 (!) 128 (!) 130 78  Resp:   18 18  Temp:    97.9 F (36.6 C)  TempSrc:    Oral  SpO2: 97% 97% 98% 94%  Weight:      Height:        Intake/Output Summary (Last 24 hours) at 01/08/2020 1544 Last data filed at 01/08/2020 1155 Gross per 24 hour  Intake 120 ml  Output 301 ml  Net -181 ml   Filed Weights   12/31/19 0316 12/31/19 1442 01/07/20 0400  Weight: 60.3 kg 62.6 kg 63.9 kg    Examination:  General exam: Frail EOMI NCAT Respiratory system: Clinically clear fine crackles Velcro- Cardiovascular system: Tachycardic rate confirmed at bedside Gastrointestinal system: Soft nontender no rebound no guarding no organomegaly  Central nervous system: Neurologically intact no focal deficit Extremities: Left lower extremities externally rotated with bandages over wounds  left upper extremity is bandaged abundantly with Coban and reinforce dressing Skin: No edema Psychiatry: Euthymic but slightly anxious  Data Reviewed: I have personally reviewed following labs and imaging studies White count 11.7 hemoglobin 8.5 platelet 384 BUN/creatinine  9/0.4 Magnesium 1.9 Troponin XI EKG my over read shows sinus tach alternating with a flutter 2-1 rate related changes across precordium without significant ST-T wave deviations other than rate related changes  Radiology Studies: DG CHEST PORT 1 VIEW  Result Date: 01/07/2020 CLINICAL DATA:  Heart failure. EXAM: PORTABLE CHEST 1 VIEW COMPARISON:  01/02/2020, 05/13/2018.  CT chest 11/04/2019. FINDINGS: Mediastinum appears stable. Heart size appears stable. Venous congestion. Chronic interstitial changes are again noted bilaterally. Left upper lung prominent mass is again noted without interim change. No prominent pleural effusion. Biapical pleural thickening consistent scarring. No pneumothorax. No acute bony abnormality. IMPRESSION: 1.  Large left upper lung mass again noted without interim change. 2. Chronic interstitial lung disease again noted. 3. Heart size stable. No pulmonary venous congestion. Electronically Signed   By: Marcello Moores  Register   On: 01/07/2020 12:16   ECHOCARDIOGRAM LIMITED  Result Date: 01/08/2020    ECHOCARDIOGRAM LIMITED REPORT   Patient Name:   [name] Date of Exam: 01/08/2020 Medical Rec #:  [id number]      Height:       65.0 in Accession #:    [id number]     Weight:       140.9 lb Date of Birth:  December 22, 1940       BSA:          1.704 m Patient Age:    31 years  BP:           111/61 mmHg Patient Gender: F              HR:           138 bpm. Exam Location:  Inpatient Procedure: Limited Echo, Cardiac Doppler and Color Doppler STAT ECHO Indications:    abnormal ecg 794.31  History:        Patient has prior history of Echocardiogram examinations, most                 recent 11/05/2019. Cancer; Risk Factors:Diabetes and                 Dyslipidemia.  Sonographer:    Johny Chess Referring Phys: [id number] [place]  1. Left ventricular ejection fraction, by estimation, is 60 to 65%. The left ventricle has normal function. The left ventricle has no regional wall  motion abnormalities. Left ventricular diastolic parameters are indeterminate.  2. Right ventricular systolic function was not well visualized. The right ventricular size is not well visualized. Tricuspid regurgitation signal is inadequate for assessing PA pressure.  3. The mitral valve is normal in structure. Trivial mitral valve regurgitation. No evidence of mitral stenosis.  4. The aortic valve is normal in structure. Aortic valve regurgitation is trivial. No aortic stenosis is present. FINDINGS  Left Ventricle: Left ventricular ejection fraction, by estimation, is 60 to 65%. The left ventricle has normal function. The left ventricle has no regional wall motion abnormalities. The left ventricular internal cavity size was normal in size. There is  no left ventricular hypertrophy. Right Ventricle: The right ventricular size is not well visualized. Right vetricular wall thickness was not assessed. Right ventricular systolic function was not well visualized. Tricuspid regurgitation signal is inadequate for assessing PA pressure. Left Atrium: Left atrial size was normal in size. Right Atrium: Right atrial size was normal in size. Pericardium: There is no evidence of pericardial effusion. Mitral Valve: The mitral valve is normal in structure. Normal mobility of the mitral valve leaflets. Mild mitral annular calcification. Trivial mitral valve regurgitation. No evidence of mitral valve stenosis. Tricuspid Valve: The tricuspid valve is normal in structure. Tricuspid valve regurgitation is mild . No evidence of tricuspid stenosis. Aortic Valve: The aortic valve is normal in structure. Aortic valve regurgitation is trivial. No aortic stenosis is present. Pulmonic Valve: The pulmonic valve was normal in structure. Pulmonic valve regurgitation is trivial. No evidence of pulmonic stenosis. Aorta: The aortic root is normal in size and structure. Venous: The inferior vena cava was not well visualized. IAS/Shunts: No atrial  level shunt detected by color flow Doppler. LEFT VENTRICLE PLAX 2D LVIDd:         3.50 cm LVIDs:         2.10 cm LV PW:         0.80 cm LV IVS:        0.70 cm LVOT diam:     1.90 cm LVOT Area:     2.84 cm  LEFT ATRIUM         Index LA diam:    2.30 cm 1.35 cm/m   AORTA Ao Root diam: 3.30 cm Ao Asc diam:  2.70 cm  SHUNTS Systemic Diam: 1.90 cm Fransico Him MD Electronically signed by Fransico Him MD Signature Date/Time: 01/08/2020/3:22:09 PM    Final      Scheduled Meds: . sodium chloride   Intravenous Once  . bupivacaine liposome  20 mL Infiltration Once  .  dextromethorphan-guaiFENesin  2 tablet Oral BID  . docusate sodium  100 mg Oral BID  . enoxaparin (LOVENOX) injection  40 mg Subcutaneous Q24H  . feeding supplement (GLUCERNA SHAKE)  237 mL Oral TID BM  . ferrous sulfate  325 mg Oral BID WC  . insulin aspart  0-9 Units Subcutaneous TID WC  . insulin glargine  4 Units Subcutaneous Daily  . ipratropium-albuterol  3 mL Nebulization BID  . levothyroxine  88 mcg Oral QAC breakfast  . metoprolol tartrate  5 mg Intravenous Q6H  . metoprolol tartrate  12.5 mg Oral BID  . multivitamin with minerals  1 tablet Oral Daily  . pioglitazone  15 mg Oral Daily  . pravastatin  40 mg Oral Daily  . predniSONE  20 mg Oral QAC breakfast  . vortioxetine HBr  10 mg Oral Daily   Continuous Infusions: . methocarbamol (ROBAXIN) IV       LOS: 8 days    Time spent: 35 minutes  Nita Sells, MD [facility] To contact the attending provider between 7A-7P or the covering provider during after hours 7P-7A, please log into the web site [email or web address] and access using universal [place] password for that web site. If you do not have the password, please call the hospital operator.  01/08/2020, 3:44 PM

## 2020-01-08 NOTE — TOC Progression Note (Signed)
Transition of Care [facility]) - Progression Note    Patient Details  Name: [name] MRN: [id number] Date of Birth: 1941/01/25  [facility]) CM/SW Contact  Sharin Mons, RN Phone Number: [phone] 01/08/2020, 9:15 AM  Clinical Narrative:    Family accepted SNF bed offer with [facility]. Insurance authorization pending.   [name] will have bed available 7/31. Pt fully vaccinated , no updated COVID test needed per admission liaison.  TOC team will continue to monitor for needs ....  Expected Discharge Plan: Skilled Nursing Facility Barriers to Discharge: Insurance Authorization, No SNF bed  Expected Discharge Plan and Services Expected Discharge Plan: [place]   Discharge Planning Services: CM Consult Post Acute Care Choice: [place] Living arrangements for the past 2 months: Single Family Home Expected Discharge Date: 01/02/20               DME Arranged:  (Home O2 at 2L/min, WC, RW, 3:1)           HH Agency:  (Active with [facility] in [place])         Social Determinants of Health ([place]) Interventions    Readmission Risk Interventions Readmission Risk Prevention Plan 01/01/2020  Transportation Screening Complete  PCP or Specialist Appt within 3-5 Days Complete  HRI or [place] Complete  Social Work Consult for [facility] Planning/Counseling Complete  Palliative Care Screening Complete  Medication Review Press photographer) Complete  Some recent data might be hidden

## 2020-01-08 NOTE — Progress Notes (Signed)
   01/08/20 1258  Assess: MEWS Score  BP 95/69  Pulse Rate (!) 147  Resp 17  Level of Consciousness Alert  SpO2 97 %  O2 Device Nasal Cannula  O2 Flow Rate (L/min) 1.5 L/min  Assess: MEWS Score  MEWS Temp 0  MEWS Systolic 1  MEWS Pulse 3  MEWS RR 0  MEWS LOC 0  MEWS Score 4  MEWS Score Color Red  Assess: if the MEWS score is Yellow or Red  Were vital signs taken at a resting state? Yes  Focused Assessment No change from prior assessment  Early Detection of Sepsis Score *See Row Information* Low  MEWS guidelines implemented *See Row Information* Yes  Treat  MEWS Interventions Escalated (See documentation below)  Pain Scale 0-10  Pain Score 0  Take Vital Signs  Increase Vital Sign Frequency  Red: Q 1hr X 4 then Q 4hr X 4, if remains red, continue Q 4hrs  Escalate  MEWS: Escalate Red: discuss with charge nurse/RN and provider, consider discussing with RRT  Notify: Charge Nurse/RN  Name of Charge Nurse/RN Notified Lazarus Gowda, RN  Date Charge Nurse/RN Notified 01/08/20  Time Charge Nurse/RN Notified  [phone])  Notify: Provider  Provider Name/Title Dr. Verlon Au  Date Provider Notified 01/08/20  Time Provider Notified 1303  Notification Type [place] (By RRT RN)  Notification Reason Change in status  Response No new orders  Date of Provider Response 01/08/20  Time of Provider Response 1303  Notify: Rapid Response  Name of Rapid Response RN Notified [facility]  Date Rapid Response Notified 01/08/20  Time Rapid Response Notified 0272  Document  Patient Outcome Transferred/level of care increased Gus Puma, primary RN attempted to call report)  Progress note created (see row info) Yes  Primary RN, Gus Puma, attempted to call report.  6E RN to call back when available.

## 2020-01-08 NOTE — Significant Event (Addendum)
Rapid Response Event Note  Reason for Call : HR 150s  Initial Focused Assessment:  Pt lying in bed. She is alert and oriented. She is able to follow commands. Skin is warm, dry. She does not appear to be distressed, denies pain. Lung sounds are clear. Mild JVD noted.   VS: T 98.52F, BP 112/63, HR 148, RR 16, SpO2 97% on 1.5L [place]  Interventions: -EKG completed. Initial EKG read STEMI, but upon review and repeat EKG no ST elevation noted. Reviewed with Dr. Verlon Au.  -5mg  IV Lopressor given -Scheduled PO Lopressor given  Plan of Care: -Transfer to cardiac progressive care -Scheduled PO Lopressor  Event Summary: MD Notified:  Dr. Verlon Au Call Time: MD at bedside upon my arrival Arrival Time:  1215 End Time: Ginger Blue, RN

## 2020-01-08 NOTE — Progress Notes (Signed)
I completed a scheduling message for family to talk with provider on the day of chemo

## 2020-01-08 NOTE — Progress Notes (Signed)
   01/08/20 1208  Assess: MEWS Score  Temp 98.5 F (36.9 C)  BP 109/67  Pulse Rate (!) 163  Resp 16  SpO2 100 %  O2 Flow Rate (L/min) 1.5 L/min  Assess: MEWS Score  MEWS Temp 0  MEWS Systolic 0  MEWS Pulse 3  MEWS RR 0  MEWS LOC 0  MEWS Score 3  MEWS Score Color Yellow  Assess: if the MEWS score is Yellow or Red  Were vital signs taken at a resting state? Yes  Focused Assessment Change from prior assessment (see assessment flowsheet)  Early Detection of Sepsis Score *See Row Information* Low  MEWS guidelines implemented *See Row Information* Yes  Treat  MEWS Interventions Escalated (See documentation below);Administered scheduled meds/treatments;Administered prn meds/treatments  Pain Scale 0-10  Pain Score 0  Take Vital Signs  Increase Vital Sign Frequency  Yellow: Q 2hr X 2 then Q 4hr X 2, if remains yellow, continue Q 4hrs  Escalate  MEWS: Escalate Yellow: discuss with charge nurse/RN and consider discussing with provider and RRT  Notify: Charge Nurse/RN  Name of Charge Nurse/RN Notified Lazarus Gowda, RN  Date Charge Nurse/RN Notified 01/08/20  Time Charge Nurse/RN Notified 1210  Notify: Provider  Provider Name/Title Dr. Verlon Au  Date Provider Notified 01/08/20  Time Provider Notified 1210  Notification Type Face-to-face  Notification Reason Change in status  Response See new orders  Date of Provider Response 01/08/20  Time of Provider Response 1210  Notify: Rapid Response  Name of Rapid Response RN Notified [facility]  Date Rapid Response Notified 01/08/20  Time Rapid Response Notified 1017  Document  Patient Outcome Transferred/level of care increased  Progress note created (see row info) Yes  Dr, Verlon Au and Wilburn Cornelia, RRT RN at bedside.  Medications administered by RRT and order received to transfer patient to Progressive Care.  Waiting for bed.  AC aware and assisting with bed placement.

## 2020-01-08 NOTE — Progress Notes (Signed)
Pt requested for pure wik and son agreed, d/t some urinary incontinence and urgenc. Writer paged on call NP/MD (J. Mansy) and ordered to have pt on a pure wik. Order will be carried out.

## 2020-01-08 NOTE — Consult Note (Addendum)
Cardiology Consultation:   Patient ID: [name] MRN: [id number]; DOB: 03/09/1941  Admit date: 12/31/2019 Date of Consult: 01/08/2020  Primary Care Provider: [name], MD [facility] Cardiologist: Sanda Klein, MD  [facility] Electrophysiologist:  None    Patient Profile:   [name] is a 79 y.o. female with a history of extensive coronary artery calcification on chest CT in 05/2018, right supraclinoid ICA aneurysm, interstitial lung disease with chronic respiratory failure on 2L of O2 at home, stage IIIc non-small cell lung cancer, hypertension, hyperlipidemia, and type 2 diabetes mellitus, and hypothyroidism who is being seen today for the evaluation of atrial flutter with RVR at the request of Dr. Verlon Au.  History of Present Illness:   [name] is a 79 year old female with the above history who is followed by Dr. Sallyanne Kuster. She was referred to Dr. Sallyanne Kuster in 07/2018 for incidentally finding of extensive coronary artery calcification on CT scan in 05/2018. She has interstitial lung disease and lung cancer which is why the CT was performed. Cardiopulmonary stress testing in 07/2018 showed abnormal but low risk findings and evidence of impaired exercise capacity due to both pulmonary and cardiac issues including a mild degree of chronotropic incompetence. Peak VO2 was roughly 75%ile. Patient denied any chest pain; therefore, she was not felt to benefit from percutaneous revascularization and given advanced lung disease she would not be candidate for CABG. Therefore, decision made to treat medically. Patient was admitted from 11/03/2019 to 11/14/2019 for right femoral neck fracture following fall. She underwent right total hip arthroplasty on 11/04/2019. Following procedure, she had a Echo done on 11/05/2019 which showed LVEF of 65-70% with normal wall motion, grade 1 diastolic dysfunction, and moderately elevated PASP of 48.4 mmHg. She was ultimately discharged to [facility].  Patient was readmitted on 12/31/2019 for left femoral neck angulated comminuted fracture and acute styloid ulnar fracture after a ground-level fall. She underwent left total hip arthroplasty and closed reduction of left distal radius fracture on day of admission. Recovery unremarkable until this afternoon when she became tachycardic with rates in the 140-150's. EKG consistent with atypical atrial flutter. Cardiology consulted for further evaluation.  At the time of this evaluation, patient getting Echo done. She is resting comfortably. She describes a mechanical fall in which she went to use the bedside commode at home which was on her porch when the commode tipped over and she fell on the ground. No cardiac symptoms prior to fall. She did note chest discomfort last night that was initially felt to possibly be due to anxiety as she was getting very anxious thinking going back to rehab. It does not sound like her breathing has been significantly worse than her baseline. No orthopnea, PND, or edema. She describes chest flutter with atrial flutter earlier today but none at this time. No lightheadedness, dizziness, No abnormal bleeding in urine or stools.  Past Medical History:  Diagnosis Date   Bronchiectasis    PFTs August 23, 2010 VC 82%   dlco 62 > 129 CORRECTED   Chronic diarrhea    Chronic rhinitis    sinus CT ordered August 23, 2010   Diabetes mellitus ([place])    Hyperplastic colon polyp    IBS (irritable bowel syndrome)    ILD (interstitial lung disease) ([place])    Stenosis of rectum and anus    Thyroid disease     Past Surgical History:  Procedure Laterality Date    gallbaldder     BRONCHIAL  BIOPSY  11/13/2019   Procedure: BRONCHIAL BIOPSIES;  Surgeon: Collene Gobble, MD;  Location: [facility];  Service: Cardiopulmonary;;   BRONCHIAL BRUSHINGS  11/13/2019   Procedure: BRONCHIAL BRUSHINGS;  Surgeon: Collene Gobble, MD;  Location: [facility];  Service: Cardiopulmonary;;    CHOLECYSTECTOMY     DILATION AND CURETTAGE OF UTERUS     HAND SURGERY  1991   left hand; Baker's Cyst removed   migraine     OPEN REDUCTION INTERNAL FIXATION (ORIF) DISTAL RADIAL FRACTURE Left 12/31/2019   Procedure: CLOSED REDUCTION LEFT DISTAL RADIUS FRACTURE;  Surgeon: Leandrew Koyanagi, MD;  Location: [facility];  Service: Orthopedics;  Laterality: Left;   tear duct surgery     TOTAL HIP ARTHROPLASTY Right 11/04/2019   Procedure: TOTAL HIP ARTHROPLASTY ANTERIOR APPROACH;  Surgeon: Paralee Cancel, MD;  Location: [facility];  Service: Orthopedics;  Laterality: Right;   TOTAL HIP ARTHROPLASTY Left 12/31/2019   Procedure: LEFT TOTAL HIP ARTHROPLASTY ANTERIOR APPROACH;  Surgeon: Leandrew Koyanagi, MD;  Location: [place];  Service: Orthopedics;  Laterality: Left;   VIDEO BRONCHOSCOPY N/A 11/13/2019   Procedure: VIDEO BRONCHOSCOPY;  Surgeon: Collene Gobble, MD;  Location: [facility];  Service: Cardiopulmonary;  Laterality: N/A;     Home Medications:  Prior to Admission medications   Medication Sig Start Date End Date Taking? Authorizing Provider  Ascorbic Acid (VITAMIN C) 1000 MG tablet Take 1,000 mg by mouth at bedtime.    Yes [provider]  benzonatate (TESSALON) 100 MG capsule TAKE 1 CAPSULE (100 MG TOTAL) BY MOUTH EVERY 8 (EIGHT) HOURS AS NEEDED FOR COUGH. Patient taking differently: Take 100 mg by mouth in the morning.  11/18/18  Yes Croitoru, Mihai, MD  Biotin 10000 MCG TABS Take 10,000 mcg by mouth daily.    Yes [provider]  Calcium-Magnesium-Vitamin D (CALCIUM 1200+D3 PO) Take 1 tablet by mouth at bedtime.   Yes [provider]  clorazepate (TRANXENE) 3.75 MG tablet TAKE 1 TABLET BY MOUTH DAILY.MAY TAKE UP TO 2 DAILY IF NEEDED Patient taking differently: Take 3.75 mg by mouth daily. MAY TAKE UP TO 2 DAILY IF NEEDED 11/14/19  Yes Georgette Shell, MD  cyclobenzaprine (FLEXERIL) 10 MG tablet Take 10 mg by mouth at bedtime.    Yes [provider]  docusate sodium  (COLACE) 100 MG capsule Take 1 capsule (100 mg total) by mouth 2 (two) times daily. Patient taking differently: Take 100 mg by mouth daily.  11/05/19  Yes Maurice March, PA-C  ferrous sulfate 325 (65 FE) MG tablet Take 1 tablet (325 mg total) by mouth 3 (three) times daily after meals for 14 days. Patient taking differently: Take 325 mg by mouth daily with breakfast.  11/05/19 12/31/19 Yes Stinson, Nicola Girt, PA-C  levothyroxine (SYNTHROID, LEVOTHROID) 88 MCG tablet Take 88 mcg by mouth daily before breakfast.    Yes [provider]  Multiple Vitamin (MULTIVITAMIN) tablet Take 1 tablet by mouth daily.     Yes [provider]  pioglitazone (ACTOS) 15 MG tablet Take 15 mg by mouth daily.   Yes [provider]  pravastatin (PRAVACHOL) 40 MG tablet TAKE 1 TABLET BY MOUTH EVERY DAY IN THE EVENING Patient taking differently: Take 40 mg by mouth daily.  11/10/19  Yes Croitoru, Mihai, MD  prochlorperazine (COMPAZINE) 10 MG tablet Take 1 tablet (10 mg total) by mouth every 6 (six) hours as needed for nausea or vomiting. 12/24/19  Yes Curt Bears, MD  TRINTELLIX 10  MG TABS tablet Take 10 mg by mouth daily. 12/23/19  Yes [provider]  calcium carbonate (TUMS - DOSED IN MG ELEMENTAL CALCIUM) 500 MG chewable tablet Chew 1 tablet (200 mg of elemental calcium total) by mouth 3 (three) times daily with meals. Patient not taking: Reported on 12/31/2019 11/14/19   Georgette Shell, MD  celecoxib (CELEBREX) 200 MG capsule Take 1 capsule (200 mg total) by mouth 2 (two) times daily. Patient not taking: Reported on 12/31/2019 11/05/19   Maurice March, PA-C  enoxaparin (LOVENOX) 40 MG/0.4ML injection Inject 0.4 mLs (40 mg total) into the skin daily. 12/31/19   Leandrew Koyanagi, MD  Ensure Max Protein (ENSURE MAX PROTEIN) LIQD Take 330 mLs (11 oz total) by mouth daily for 7 days. 01/02/20 01/09/20  Kayleen Memos, DO  feeding supplement, GLUCERNA SHAKE, (GLUCERNA SHAKE) LIQD Take 237  mLs by mouth 3 (three) times daily between meals for 7 days. 01/02/20 01/09/20  Kayleen Memos, DO  ferrous sulfate 325 (65 FE) MG tablet Take 1 tablet (325 mg total) by mouth daily with breakfast. 01/02/20 02/01/20  Kayleen Memos, DO  HYDROcodone-acetaminophen (NORCO/VICODIN) 5-325 MG tablet Take 1-2 tablets by mouth every 6 (six) hours as needed for moderate pain or severe pain (pain score 4-6). Patient not taking: Reported on 12/31/2019 11/14/19   Georgette Shell, MD  menthol-cetylpyridinium (CEPACOL) 3 MG lozenge Take 1 lozenge (3 mg total) by mouth as needed for sore throat (sore throat). Patient not taking: Reported on 12/31/2019 11/14/19   Georgette Shell, MD  oxyCODONE-acetaminophen (PERCOCET) 5-325 MG tablet Take 1-2 tablets by mouth every 8 (eight) hours as needed for severe pain. 12/31/19   Leandrew Koyanagi, MD  phenol (CHLORASEPTIC) 1.4 % LIQD Use as directed 1 spray in the mouth or throat as needed for throat irritation / pain. Patient not taking: Reported on 12/31/2019 11/14/19   Georgette Shell, MD  polyvinyl alcohol (LIQUIFILM TEARS) 1.4 % ophthalmic solution Place 1 drop into the left eye 3 (three) times daily as needed for dry eyes. Patient not taking: Reported on 12/31/2019 11/14/19   Georgette Shell, MD  rivaroxaban (XARELTO) 10 MG TABS tablet Take 1 tablet (10 mg total) by mouth daily for 21 days. Patient not taking: Reported on 12/31/2019 11/06/19 11/27/19  Maurice March, PA-C  verapamil (CALAN) 120 MG tablet Take 0.5 tablets (60 mg total) by mouth 2 (two) times daily. Patient not taking: Reported on 12/31/2019 11/14/19   Georgette Shell, MD    Inpatient Medications: Scheduled Meds:  sodium chloride   Intravenous Once   bupivacaine liposome  20 mL Infiltration Once   dextromethorphan-guaiFENesin  2 tablet Oral BID   docusate sodium  100 mg Oral BID   enoxaparin (LOVENOX) injection  40 mg Subcutaneous Q24H   feeding supplement (GLUCERNA SHAKE)  237 mL Oral TID BM    ferrous sulfate  325 mg Oral BID WC   insulin aspart  0-9 Units Subcutaneous TID WC   insulin glargine  4 Units Subcutaneous Daily   ipratropium-albuterol  3 mL Nebulization BID   levothyroxine  88 mcg Oral QAC breakfast   metoprolol tartrate  5 mg Intravenous Q6H   metoprolol tartrate  12.5 mg Oral BID   multivitamin with minerals  1 tablet Oral Daily   pioglitazone  15 mg Oral Daily   pravastatin  40 mg Oral Daily   predniSONE  20 mg Oral QAC breakfast   vortioxetine HBr  10 mg Oral Daily   Continuous Infusions:  methocarbamol (ROBAXIN) IV     PRN Meds: acetaminophen, ALPRAZolam, alum & mag hydroxide-simeth, codeine, HYDROcodone-acetaminophen, HYDROcodone-acetaminophen, HYDROcodone-acetaminophen, ipratropium-albuterol, magnesium citrate, menthol-cetylpyridinium **OR** phenol, methocarbamol **OR** methocarbamol (ROBAXIN) IV, morphine injection, morphine injection, ondansetron **OR** ondansetron (ZOFRAN) IV, polyethylene glycol, senna-docusate, sorbitol  Allergies:    Allergies  Allergen Reactions   Penicillins Diarrhea    Has patient had a PCN reaction causing immediate rash, facial/tongue/throat swelling, SOB or lightheadedness with hypotension: Unknown Has patient had a PCN reaction causing severe rash involving mucus membranes or skin necrosis: Unknown Has patient had a PCN reaction that required hospitalization: No  Has patient had a PCN reaction occurring within the last 10 years: No  If all of the above answers are "NO", then may proceed with Cephalosporin use. Cephalosporins ok   Influenza Vaccines Other (See Comments)    Severe nausea, vomiting and body aches-md told her not to take it anymore   Clarithromycin     Unknown reaction    Cortisone Other (See Comments)    "Made her feel like ice water was running through her veins"    Povidone-Iodine     Unknown reaction     Social History:   Social History   Socioeconomic History   Marital status: Married    Spouse  name: Not on file   Number of children: Not on file   Years of education: Not on file   Highest education level: Not on file  Occupational History   Occupation: retired  Tobacco Use   Smoking status: Former Smoker    Packs/day: 3.00    Years: 15.00    Pack years: 45.00    Types: Cigarettes    Quit date: 06/11/1976    Years since quitting: 43.6   Smokeless tobacco: Never Used  Substance and Sexual Activity   Alcohol use: No   Drug use: No   Sexual activity: Not on file  Other Topics Concern   Not on file  Social History Narrative   Daily caffeine use   Social Determinants of Health   Financial Resource Strain:    Difficulty of Paying Living Expenses:   Food Insecurity:    Worried About Charity fundraiser in the Last Year:    Arboriculturist in the Last Year:   Transportation Needs:    Film/video editor (Medical):    Lack of Transportation (Non-Medical):   Physical Activity:    Days of Exercise per Week:    Minutes of Exercise per Session:   Stress:    Feeling of Stress :   Social Connections:    Frequency of Communication with Friends and Family:    Frequency of Social Gatherings with Friends and Family:    Attends Religious Services:    Active Member of Clubs or Organizations:    Attends Music therapist:    Marital Status:   Intimate Partner Violence:    Fear of Current or Ex-Partner:    Emotionally Abused:    Physically Abused:    Sexually Abused:     Family History:    Family History  Problem Relation Age of Onset   Colon polyps Sister    Heart disease Sister    Diabetes Sister    Heart disease Mother    Colon cancer Neg Hx      ROS:  Please see the history of present illness.  All other ROS reviewed and negative.  Physical Exam/Data:   Vitals:   01/08/20 1258 01/08/20 1300 01/08/20 1306 01/08/20 1310  BP: 95/69   (!) 111/61  Pulse: (!) 147 (!) 147 (!) 128 (!) 130  Resp: 17   18  Temp:      TempSrc:      SpO2: 97% 97%  97% 98%  Weight:      Height:        Intake/Output Summary (Last 24 hours) at 01/08/2020 1326 Last data filed at 01/08/2020 1155 Gross per 24 hour  Intake 120 ml  Output 301 ml  Net -181 ml   Last 3 Weights 01/07/2020 12/31/2019 12/31/2019  Weight (lbs) 140 lb 14 oz 138 lb 133 lb  Weight (kg) 63.9 kg 62.596 kg 60.328 kg     Body mass index is 23.44 kg/m.  General: 79 y.o. female resting comfortably in no acute distress. HEENT: Normocephalic and atraumatic. Sclera clear.  Neck: Supple. No carotid bruits. No JVD. Heart: RRR. Distinct S1 and S2. No murmurs, gallops, or rubs. Radial and distal pedal pulses 2+ and equal bilaterally. Lungs: No increased work of breathing. Clear to ausculation anteriorly.   Abdomen: Soft, non-distended, and non-tender to palpation. Bowel sounds present. MSK: Normal strength and tone for age. Extremities: No lower extremity edema.    Skin: Warm and dry. Neuro: No focal deficits. Psych: Normal affect. Responds appropriately.  EKG:  The EKG was personally reviewed and demonstrates:  Atrial flutter, rate in the 160's, with LVH, left axis deviation, and mild ST elevation in inferior leads and mild ST depression in I and aVL. Repeat EKG at 13:44 showed atrial fibrillation, rate 134 bpm, with LVH and left axis deviation but ST changes had resolved.  Telemetry:  Telemetry was personally reviewed and demonstrates:  Going in and out of atrial flutter/fibrillation and normal sinus rhythm. Rates typically in the 130's to 140's while in flutter and in the 70's when in sinus rhythm. Currently in sinus rhythm.   Relevant CV Studies: Echocardiogram 11/05/2019: Impressions: 1. Left ventricular ejection fraction, by estimation, is 65 to 70%. The  left ventricle has normal function. The left ventricle has no regional  wall motion abnormalities. Left ventricular diastolic parameters are  consistent with Grade I diastolic  dysfunction (impaired relaxation).   2. Right  ventricular systolic function is normal. The right ventricular  size is normal. There is moderately elevated pulmonary artery systolic  pressure. The estimated right ventricular systolic pressure is 62.2 mmHg.   3. The mitral valve is grossly normal. Trivial mitral valve  regurgitation.   4. The aortic valve is tricuspid. Aortic valve regurgitation is not  visualized.   5. The inferior vena cava is normal in size with greater than 50%  respiratory variability, suggesting right atrial pressure of 3 mmHg.   Laboratory Data:  High Sensitivity Troponin:  No results for input(s): TROPONINIHS in the last 720 hours.   Chemistry Recent Labs  Lab 01/06/20 0332 01/07/20 0306 01/08/20 0420  NA 136 137 137  K 4.8 4.7 4.0  CL 97* 99 96*  CO2 31 29 31   GLUCOSE 190* 149* 105*  BUN 11 12 9   CREATININE 0.51 0.46 0.45  CALCIUM 9.0 9.1 10.0  GFRNONAA >60 >60 >60  GFRAA >60 >60 >60  ANIONGAP 8 9 10     Recent Labs  Lab 01/05/20 0257  PROT 6.2*  ALBUMIN 1.7*  AST 21  ALT 9  ALKPHOS 93  BILITOT 0.5   Hematology Recent Labs  Lab 01/06/20 0332 01/07/20  8242 01/08/20 0420  WBC 8.1 11.8* 11.7*  RBC 2.78* 2.92* 3.09*  HGB 7.6* 8.1* 8.5*  HCT 24.7* 25.8* 28.1*  MCV 88.8 88.4 90.9  MCH 27.3 27.7 27.5  MCHC 30.8 31.4 30.2  RDW 14.7 15.2 15.6*  PLT 289 323 384   BNPNo results for input(s): BNP, PROBNP in the last 168 hours.  DDimer No results for input(s): DDIMER in the last 168 hours.   Radiology/Studies:  DG CHEST PORT 1 VIEW  Result Date: 01/07/2020 CLINICAL DATA:  Heart failure. EXAM: PORTABLE CHEST 1 VIEW COMPARISON:  01/02/2020, 05/13/2018.  CT chest 11/04/2019. FINDINGS: Mediastinum appears stable. Heart size appears stable. Venous congestion. Chronic interstitial changes are again noted bilaterally. Left upper lung prominent mass is again noted without interim change. No prominent pleural effusion. Biapical pleural thickening consistent scarring. No pneumothorax. No acute bony  abnormality. IMPRESSION: 1.  Large left upper lung mass again noted without interim change. 2. Chronic interstitial lung disease again noted. 3. Heart size stable. No pulmonary venous congestion. Electronically Signed   By: [place]   On: 01/07/2020 12:16     Assessment and Plan:   Atrial Flutter with RVR - Patient developed atypical atrial flutter with rates as high as the 160's. Occurred following hip surgery on 12/31/2019.  - Telemetry reviewed and patient going in and out of atrial flutter/fibrillation vs normal sinus rhythm. When in sinus rhythm, rates in the 70's. Currently in normal sinus rhythm.  - K 4.0.  - Mg pending. - TSH pending.  - Echo has been done but final read pending.  - Continue Lopressor 12.5mg  twice daily. Can up-titrate as needed. Patient used to be on Verapamil at home for migraines. Could consider restarting this if EF normal. Amiodarone is not a good option with severe lung disease.  - CHA2DS2-VASc=6. Hopefully, patient will maintain normal sinus rhythm. Would hold off on anticoagulation right now given recent surgery, chronic anemia with hgb of 8.5, and 2 recent falls. However, patient also has cancer which puts her in hypercoagulable states so if she has recurrence, may need to discuss starting DOAC.  CAD - Patient had some chest discomfort yesterday. Sounds like she was very anxious at the time.  - Initial EKG showed minimal ST elevation in inferior leads and mild ST depression in I and aVL while patient in atrial flutter with rates in the 160's. However, when rates slowed these changes resolved. - Currently chest pain free.  - Echo pending. - She has known extensive coronary artery calcifications noted on prior chest CT. However, it was previously felt that she would not benefit from PCI given lack of chest pain so cardiac cath was not pursued.   Otherwise, per primary team: - Left femoral neck fracture - Left distal radius fracture - Saccular aneurysm  of right ICA - Interstitial lung disease - Lung cancer - Chronic anemia - Hypothyroidism  For questions or updates, please contact [facility] Please consult [email or web address] for contact info under    Signed, Darreld Mclean, PA-C  01/08/2020 1:26 PM

## 2020-01-08 NOTE — Progress Notes (Signed)
°  Echocardiogram 2D Echocardiogram has been performed.  [name] 01/08/2020, 2:26 PM

## 2020-01-08 NOTE — Progress Notes (Signed)
Patient transferred to 5E via bed with CN and RRT RN at 1319 on 01/08/20.  Verbal report called by primary RN.

## 2020-01-08 NOTE — Plan of Care (Signed)

## 2020-01-08 NOTE — Plan of Care (Signed)

## 2020-01-09 DIAGNOSIS — R52 Pain, unspecified: Secondary | ICD-10-CM | POA: Diagnosis not present

## 2020-01-09 DIAGNOSIS — I4892 Unspecified atrial flutter: Secondary | ICD-10-CM

## 2020-01-09 LAB — CBC WITH DIFFERENTIAL/PLATELET
Abs Immature Granulocytes: 0.11 10*3/uL — ABNORMAL HIGH (ref 0.00–0.07)
Basophils Absolute: 0 10*3/uL (ref 0.0–0.1)
Basophils Relative: 0 %
Eosinophils Absolute: 0.1 10*3/uL (ref 0.0–0.5)
Eosinophils Relative: 1 %
HCT: 30.3 % — ABNORMAL LOW (ref 36.0–46.0)
Hemoglobin: 9.2 g/dL — ABNORMAL LOW (ref 12.0–15.0)
Immature Granulocytes: 1 %
Lymphocytes Relative: 19 %
Lymphs Abs: 2.4 10*3/uL (ref 0.7–4.0)
MCH: 27.6 pg (ref 26.0–34.0)
MCHC: 30.4 g/dL (ref 30.0–36.0)
MCV: 91 fL (ref 80.0–100.0)
Monocytes Absolute: 0.7 10*3/uL (ref 0.1–1.0)
Monocytes Relative: 6 %
Neutro Abs: 9.2 10*3/uL — ABNORMAL HIGH (ref 1.7–7.7)
Neutrophils Relative %: 73 %
Platelets: 408 10*3/uL — ABNORMAL HIGH (ref 150–400)
RBC: 3.33 MIL/uL — ABNORMAL LOW (ref 3.87–5.11)
RDW: 15.8 % — ABNORMAL HIGH (ref 11.5–15.5)
WBC: 12.5 10*3/uL — ABNORMAL HIGH (ref 4.0–10.5)
nRBC: 0 % (ref 0.0–0.2)

## 2020-01-09 LAB — BASIC METABOLIC PANEL
Anion gap: 9 (ref 5–15)
BUN: 19 mg/dL (ref 8–23)
CO2: 31 mmol/L (ref 22–32)
Calcium: 10.1 mg/dL (ref 8.9–10.3)
Chloride: 96 mmol/L — ABNORMAL LOW (ref 98–111)
Creatinine, Ser: 0.52 mg/dL (ref 0.44–1.00)
GFR calc Af Amer: >60 mL/min (ref >60)
GFR calc non Af Amer: >60 mL/min (ref >60)
Glucose, Bld: 123 mg/dL — ABNORMAL HIGH (ref 70–99)
Potassium: 4 mmol/L (ref 3.5–5.1)
Sodium: 136 mmol/L (ref 135–145)

## 2020-01-09 LAB — GLUCOSE, CAPILLARY
Glucose-Capillary: 161 mg/dL — ABNORMAL HIGH (ref 70–99)
Glucose-Capillary: 153 mg/dL — ABNORMAL HIGH (ref 70–99)
Glucose-Capillary: 155 mg/dL — ABNORMAL HIGH (ref 70–99)
Glucose-Capillary: 228 mg/dL — ABNORMAL HIGH (ref 70–99)

## 2020-01-09 LAB — MAGNESIUM: Magnesium: 1.8 mg/dL (ref 1.7–2.4)

## 2020-01-09 MED ORDER — ALPRAZOLAM 0.5 MG PO TABS: 0.5000 mg | ORAL_TABLET | Freq: Two times a day (BID) | ORAL | 0 refills | Status: AC | PRN

## 2020-01-09 MED ORDER — METOPROLOL TARTRATE 25 MG PO TABS: 12.5000 mg | ORAL_TABLET | Freq: Two times a day (BID) | ORAL | Status: DC

## 2020-01-09 MED ORDER — PREDNISONE 20 MG PO TABS: 20.0000 mg | ORAL_TABLET | Freq: Every day | ORAL | 0 refills | Status: AC

## 2020-01-09 MED ORDER — DM-GUAIFENESIN ER 30-600 MG PO TB12: 2.0000 | ORAL_TABLET | Freq: Two times a day (BID) | ORAL | 0 refills | Status: AC

## 2020-01-09 MED ORDER — TRINTELLIX 10 MG PO TABS: 10.0000 mg | ORAL_TABLET | Freq: Every day | ORAL | 0 refills | Status: AC

## 2020-01-09 MED ORDER — UMECLIDINIUM BROMIDE 62.5 MCG/INH IN AEPB: 1.0000 | INHALATION_SPRAY | Freq: Every day | RESPIRATORY_TRACT | 0 refills | Status: AC

## 2020-01-09 MED ORDER — LEVALBUTEROL HCL 0.63 MG/3ML IN NEBU: 0.6300 mg | INHALATION_SOLUTION | Freq: Two times a day (BID) | RESPIRATORY_TRACT | 12 refills | Status: AC

## 2020-01-09 NOTE — Progress Notes (Signed)
Dr. Harrington Challenger requested assistance arranging 3 week OP monitor for atrial flutter, live kind. Sent request to our office to help arrange. The patient already has f/u with Dr. Sallyanne Kuster 8/5 which Dr. Harrington Challenger recommends to keep for now.  Amrita Radu PA-C

## 2020-01-09 NOTE — TOC Progression Note (Signed)
Transition of Care [facility]) - Progression Note    Patient Details  Name: [name] MRN: [id number] Date of Birth: [birth year]  Transition [facility] Care [facility] CM/SW [place], [place] Phone Number: [phone] 01/09/2020, 9:19 AM  Clinical Narrative:     CSW called Bernadene Bell and patient's Josem Kaufmann is back.   Everlene Balls #- [id number] Health Plan #- [id number] Case Manager: Cristobal Goldmann Authorization dates: 7/30 - 8/3 Fax Number: [phone]   CSW alerted MD Samtani that Josem Kaufmann is back and he will alert CSW if patient is medically stable for discharge today.  TOC team will continue to assist with discharge planning needs.  Expected Discharge Plan: Skilled Nursing Facility Barriers to Discharge: Insurance Authorization, No SNF bed  Expected Discharge Plan and Services Expected Discharge Plan: [place]   Discharge Planning Services: CM Consult Post Acute Care Choice: [place] Living arrangements for the past 2 months: Single Family Home Expected Discharge Date: 01/02/20               DME Arranged:  (Home O2 at 2L/min, WC, RW, 3:1)           HH Agency:  (Active with [facility] in [place])         Social Determinants of Health ([place]) Interventions    Readmission Risk Interventions Readmission Risk Prevention Plan 01/01/2020  Transportation Screening Complete  PCP or Specialist Appt within 3-5 Days Complete  HRI or [place] Complete  Social Work Consult for [place] Planning/Counseling Complete  Palliative Care Screening Complete  Medication Review Press photographer) Complete  Some recent data might be hidden

## 2020-01-09 NOTE — TOC Progression Note (Signed)
Transition of Care [facility]) - Progression Note    Patient Details  Name: [name] MRN: [id number] Date of Birth: 1940-09-07  Transition of Care [facility]) CM/SW [place], [place] Phone Number: [phone] 01/09/2020, 12:41 PM  Clinical Narrative:     CSW was alerted that patient's discharge was delayed. Patient will possibly be discharged tomorrow.  Facility updated about change in discharge.  TOC team will continue to assist with discharge planning needs.  Expected Discharge Plan: Skilled Nursing Facility Barriers to Discharge: No Barriers Identified  Expected Discharge Plan and Services Expected Discharge Plan: [place]   Discharge Planning Services: CM Consult Post Acute Care Choice: [place] Living arrangements for the past 2 months: Single Family Home Expected Discharge Date: 01/09/20               DME Arranged:  (Home O2 at 2L/min, WC, RW, 3:1)           HH Agency:  (Active with [facility] in [place])         Social Determinants of Health ([place]) Interventions    Readmission Risk Interventions Readmission Risk Prevention Plan 01/01/2020  Transportation Screening Complete  PCP or Specialist Appt within 3-5 Days Complete  HRI or [place] Complete  Social Work Consult for [place] Planning/Counseling Complete  Palliative Care Screening Complete  Medication Review Press photographer) Complete  Some recent data might be hidden

## 2020-01-09 NOTE — Progress Notes (Signed)
Progress Note  Patient Name: [name] Date of Encounter: 01/09/2020  [facility] Cardiologist: Sanda Klein, MD   Subjective   Pt's breathing OK   No CP    Inpatient Medications    Scheduled Meds: . sodium chloride   Intravenous Once  . bupivacaine liposome  20 mL Infiltration Once  . dextromethorphan-guaiFENesin  2 tablet Oral BID  . docusate sodium  100 mg Oral BID  . enoxaparin (LOVENOX) injection  40 mg Subcutaneous Q24H  . feeding supplement (GLUCERNA SHAKE)  237 mL Oral TID BM  . ferrous sulfate  325 mg Oral BID WC  . insulin aspart  0-9 Units Subcutaneous TID WC  . insulin glargine  4 Units Subcutaneous Daily  . levalbuterol  0.63 mg Nebulization BID  . levothyroxine  88 mcg Oral QAC breakfast  . metoprolol tartrate  12.5 mg Oral BID  . multivitamin with minerals  1 tablet Oral Daily  . pioglitazone  15 mg Oral Daily  . pravastatin  40 mg Oral Daily  . predniSONE  20 mg Oral QAC breakfast  . umeclidinium bromide  1 puff Inhalation Daily  . vortioxetine HBr  10 mg Oral Daily   Continuous Infusions: . methocarbamol (ROBAXIN) IV     PRN Meds: acetaminophen, ALPRAZolam, alum & mag hydroxide-simeth, HYDROcodone-acetaminophen, HYDROcodone-acetaminophen, HYDROcodone-acetaminophen, magnesium citrate, menthol-cetylpyridinium **OR** phenol, methocarbamol **OR** methocarbamol (ROBAXIN) IV, morphine injection, morphine injection, ondansetron **OR** ondansetron (ZOFRAN) IV, polyethylene glycol, senna-docusate, sorbitol   Vital Signs    Vitals:   01/09/20 0101 01/09/20 0444 01/09/20 0716 01/09/20 0901  BP: (!) 97/58 (!) 117/48 (!) 108/47   Pulse: 64 76 77 86  Resp: 16 18 15    Temp: 98.1 F (36.7 C) 98.1 F (36.7 C) 98.4 F (36.9 C)   TempSrc: Oral Oral Oral   SpO2: 100% 100% 100%   Weight:      Height:        Intake/Output Summary (Last 24 hours) at 01/09/2020 1202 Last data filed at 01/09/2020 0645 Gross per 24 hour  Intake 240 ml  Output 950 ml  Net  -710 ml   Last 3 Weights 01/07/2020 12/31/2019 12/31/2019  Weight (lbs) 140 lb 14 oz 138 lb 133 lb  Weight (kg) 63.9 kg 62.596 kg 60.328 kg      Telemetry    SR  Personally Reviewed  ECG    None today  - Personally Reviewed  Physical Exam   GEN: Thin 79 yo in No acute distress.   Neck: No JVD Cardiac: RRR, no murmurs, rubs, or gallops.  Respiratory: Clear to auscultation bilaterally. GI: Soft, nontender, non-distended  MS: No edema; No deformity. Neuro:  Nonfocal  Psych: Normal affect   Labs    High Sensitivity Troponin:   Recent Labs  Lab 01/08/20 1443  TROPONINIHS 11      Chemistry Recent Labs  Lab 01/05/20 0257 01/06/20 0332 01/07/20 0306 01/08/20 0420 01/09/20 0236  NA 133*   < > 137 137 136  K 3.3*   < > 4.7 4.0 4.0  CL 95*   < > 99 96* 96*  CO2 31   < > 29 31 31   GLUCOSE 129*   < > 149* 105* 123*  BUN 7*   < > 12 9 19   CREATININE 0.42*   < > 0.46 0.45 0.52  CALCIUM 8.9   < > 9.1 10.0 10.1  PROT 6.2*  --   --   --   --   ALBUMIN  1.7*  --   --   --   --   AST 21  --   --   --   --   ALT 9  --   --   --   --   ALKPHOS 93  --   --   --   --   BILITOT 0.5  --   --   --   --   GFRNONAA >60   < > >60 >60 >60  GFRAA >60   < > >60 >60 >60  ANIONGAP 7   < > 9 10 9    < > = values in this interval not displayed.     Hematology Recent Labs  Lab 01/07/20 0306 01/08/20 0420 01/09/20 0236  WBC 11.8* 11.7* 12.5*  RBC 2.92* 3.09* 3.33*  HGB 8.1* 8.5* 9.2*  HCT 25.8* 28.1* 30.3*  MCV 88.4 90.9 91.0  MCH 27.7 27.5 27.6  MCHC 31.4 30.2 30.4  RDW 15.2 15.6* 15.8*  PLT 323 384 408*    BNPNo results for input(s): BNP, PROBNP in the last 168 hours.   DDimer No results for input(s): DDIMER in the last 168 hours.   Radiology    DG CHEST PORT 1 VIEW  Result Date: 01/07/2020 CLINICAL DATA:  Heart failure. EXAM: PORTABLE CHEST 1 VIEW COMPARISON:  01/02/2020, 05/13/2018.  CT chest 11/04/2019. FINDINGS: Mediastinum appears stable. Heart size appears stable.  Venous congestion. Chronic interstitial changes are again noted bilaterally. Left upper lung prominent mass is again noted without interim change. No prominent pleural effusion. Biapical pleural thickening consistent scarring. No pneumothorax. No acute bony abnormality. IMPRESSION: 1.  Large left upper lung mass again noted without interim change. 2. Chronic interstitial lung disease again noted. 3. Heart size stable. No pulmonary venous congestion. Electronically Signed   By: Marcello Moores  Register   On: 01/07/2020 12:16   ECHOCARDIOGRAM LIMITED  Result Date: 01/08/2020    ECHOCARDIOGRAM LIMITED REPORT   Patient Name:   [name] Date of Exam: 01/08/2020 Medical Rec #:  [id number]      Height:       65.0 in Accession #:    [id number]     Weight:       140.9 lb Date of Birth:  03/31/1941       BSA:          1.704 m Patient Age:    76 years       BP:           111/61 mmHg Patient Gender: F              HR:           138 bpm. Exam Location:  Inpatient Procedure: Limited Echo, Cardiac Doppler and Color Doppler STAT ECHO Indications:    abnormal ecg 794.31  History:        Patient has prior history of Echocardiogram examinations, most                 recent 11/05/2019. Cancer; Risk Factors:Diabetes and                 Dyslipidemia.  Sonographer:    Johny Chess Referring Phys: [id number] Revloc  1. Left ventricular ejection fraction, by estimation, is 60 to 65%. The left ventricle has normal function. The left ventricle has no regional wall motion abnormalities. Left ventricular diastolic parameters are indeterminate.  2. Right ventricular systolic function was not well visualized. The right ventricular size is  not well visualized. Tricuspid regurgitation signal is inadequate for assessing PA pressure.  3. The mitral valve is normal in structure. Trivial mitral valve regurgitation. No evidence of mitral stenosis.  4. The aortic valve is normal in structure. Aortic valve regurgitation is trivial.  No aortic stenosis is present. FINDINGS  Left Ventricle: Left ventricular ejection fraction, by estimation, is 60 to 65%. The left ventricle has normal function. The left ventricle has no regional wall motion abnormalities. The left ventricular internal cavity size was normal in size. There is  no left ventricular hypertrophy. Right Ventricle: The right ventricular size is not well visualized. Right vetricular wall thickness was not assessed. Right ventricular systolic function was not well visualized. Tricuspid regurgitation signal is inadequate for assessing PA pressure. Left Atrium: Left atrial size was normal in size. Right Atrium: Right atrial size was normal in size. Pericardium: There is no evidence of pericardial effusion. Mitral Valve: The mitral valve is normal in structure. Normal mobility of the mitral valve leaflets. Mild mitral annular calcification. Trivial mitral valve regurgitation. No evidence of mitral valve stenosis. Tricuspid Valve: The tricuspid valve is normal in structure. Tricuspid valve regurgitation is mild . No evidence of tricuspid stenosis. Aortic Valve: The aortic valve is normal in structure. Aortic valve regurgitation is trivial. No aortic stenosis is present. Pulmonic Valve: The pulmonic valve was normal in structure. Pulmonic valve regurgitation is trivial. No evidence of pulmonic stenosis. Aorta: The aortic root is normal in size and structure. Venous: The inferior vena cava was not well visualized. IAS/Shunts: No atrial level shunt detected by color flow Doppler. LEFT VENTRICLE PLAX 2D LVIDd:         3.50 cm LVIDs:         2.10 cm LV PW:         0.80 cm LV IVS:        0.70 cm LVOT diam:     1.90 cm LVOT Area:     2.84 cm  LEFT ATRIUM         Index LA diam:    2.30 cm 1.35 cm/m   AORTA Ao Root diam: 3.30 cm Ao Asc diam:  2.70 cm  SHUNTS Systemic Diam: 1.90 cm Fransico Him MD Electronically signed by Fransico Him MD Signature Date/Time: 01/08/2020/3:22:09 PM    Final      Cardiac Studies   Relevant CV Studies: Echocardiogram 11/05/2019: Impressions: 1. Left ventricular ejection fraction, by estimation, is 65 to 70%. The  left ventricle has normal function. The left ventricle has no regional  wall motion abnormalities. Left ventricular diastolic parameters are  consistent with Grade I diastolic  dysfunction (impaired relaxation).  2. Right ventricular systolic function is normal. The right ventricular  size is normal. There is moderately elevated pulmonary artery systolic  pressure. The estimated right ventricular systolic pressure is 53.2 mmHg.  3. The mitral valve is grossly normal. Trivial mitral valve  regurgitation.  4. The aortic valve is tricuspid. Aortic valve regurgitation is not  visualized.  5. The inferior vena cava is normal in size with greater than 50%  respiratory variability, suggesting right atrial pressure of 3 mmHg.    Patient Profile     [name] is a 79 y.o. female with a history of extensive coronary artery calcification on chest CT in 05/2018, right supraclinoid ICA aneurysm, interstitial lung disease with chronic respiratory failure on 2L of O2 at home, stage IIIc non-small cell lung cancer, hypertension, hyperlipidemia, and type 2 diabetes mellitus, and hypothyroidism  who is being seen today for the evaluation of atrial flutter with RVR at the request of Dr. Verlon Au.  Assessment & Plan    1  Atrial flutter with RVR   REmains in SR     I have reviewed records   Pt at high risk for bleeds / falls but also clotting  I have reviewed with son and Dr Verlon Au. WIth risks for bleeding would document further afib/flutter before further anticoagulation  I will arrange for pt to get event monitor as outpt to confirm WIll make sure has f/u in cardiology after   Continue b blocker   For questions or updates, please contact [facility] Please consult [email or web address] for contact info under        Signed, Dorris Carnes, MD  01/09/2020, 12:02 PM

## 2020-01-09 NOTE — Progress Notes (Signed)
Pt indicated she is not ready and well enough to go to the facility yet. States "  I am not sure if I am ready, I am not just myself today" . Dr. Verlon Au text paged to update . MD to cancel transfer. Will update SW .

## 2020-01-09 NOTE — TOC Transition Note (Signed)
Transition of Care [facility]) - CM/SW Discharge Note   Patient Details  Name: [name] MRN: [id number] Date of Birth: [birth year]  Transition [facility]) CM/SW Contact:  Bary Castilla, LCSW Phone Number: [phone] 01/09/2020, 11:47 AM   Clinical Narrative:    Patient will DC to:?[facility]  Anticipated DC date:?01/09/20 Family notified:?Cathy-left message Transport by: Corey Harold   Per MD patient ready for DC to [facility]. RN, patient, patient's family, and facility notified of DC. Discharge Summary sent to facility. RN given number for report  [phone] room 106P. DC packet on chart. Ambulance transport requested for patient.   CSW signing off.   [place], [place] [phone]    Final next level of care: [place] Barriers to Discharge: No Barriers Identified   Patient Goals and CMS Choice Patient states their goals for this hospitalization and ongoing recovery are:: Plans to go to short term rehab possibly close to [place]. CMS Medicare.gov Compare Post Acute Care list provided to:: Patient Choice offered to / list presented to : Patient  Discharge Placement              Patient [facility] bed at: [facility] Patient to be transferred to facility by: [place] Name of family member notified: Cathy Patient and family notified of of transfer: 01/09/20  Discharge Plan and Services   Discharge Planning Services: CM Consult Post Acute Care Choice: [place]          DME Arranged:  (Home O2 at 2L/min, WC, RW, 3:1)           [place] Agency:  (Active with [facility] in [place])        Social Determinants of Health ([place]) Interventions     Readmission Risk Interventions Readmission Risk Prevention Plan 01/01/2020  Transportation Screening Complete  PCP or Specialist Appt within 3-5 Days Complete  HRI or Home Care Consult Complete  Social Work Consult for [place] Planning/Counseling Complete  Palliative Care Screening  Complete  Medication Review Press photographer) Complete  Some recent data might be hidden

## 2020-01-09 NOTE — Discharge Summary (Signed)
Physician Discharge Summary  [name] OHY:[id number] DOB: [birth year] DOA: 12/31/2019  PCP: [name], MD  Admit date: 12/31/2019 Discharge date: 01/09/2020  Time spent: 45 minutes  Recommendations for Outpatient Follow-up:  1. Will need outpatient coordination with Dr. [name] with regards to XRT/chemo initiation post recovery from hip surgery performed on 7/22 2. Suggest outpatient follow-up with Dr. [name] of orthopedics probably around the first week of August to ensure stability 3. Will need Chem-12, CBC in about 1 week 4. Neuro interventional radiology Dr. Estanislado Pandy and Dr. Norma Fredrickson are aware of her for her ICA aneurysm and this will be coordinated in terms of follow-up with them 5. Patient will need follow-up with cardiology and a Zio patch will be arranged and mailed to the patient-decision for anticoagulation is difficult and has been deferred for now given her recent fall hip fracture and high risk for bleeding based on a has bled score >4-this has to be weighed in mind with her paroxysmal A. fib and should be followed in the outpatient setting by cardiology 6. Lovenox for DVT prophylaxis at this time ending as per Dr. [name] w-- follow-up as scheduled  Discharge Diagnoses:  Active Problems:   Fracture of femoral neck, left, closed (HCC)   Protein-calorie malnutrition, severe   Closed fracture of left distal radius   Closed fracture of left wrist   Discharge Condition:fair  Diet recommendation: hh  Filed Weights   12/31/19 0316 12/31/19 1442 01/07/20 0400  Weight: 60.3 kg 62.6 kg 63.9 kg    History of present illness:  79 white female prior 45-pack-year smoker community dwelling resident Pulmonary fibrosis/ILD (with autoimmune features-cannot use immunosuppressants given new cancer diagnosis) previously managed by Dr. [name] lost to follow-up until 2016 currently follows with [facility] pulmonology Dr. [name] Last visit 12/28/2019 found to have pulmonary nodule Bronchoscopy  11/13/2019 = SQ CC LUL-to be followed by Dr. [name]  XRT scheduled 01/15/2020 Diabetes mellitus type 2, hypothyroid, hyperlipidemia, recent blood loss anemia from surgery Sustained hip fracture 11/03/2019 discharged to [name] rehab- Readmitted 12/31/2019 with ground-level fall acute styloid ulnar fracture, left femoral neck angulated comminuted fracture Ortho/plastic surgery consulted Underwent surgery for hip 7/22-and closed reduction with manipulation of left distal wrist fracture with sugar tong splinting by Dr. [name] She progressed to some degree but flipped into a flutter/SVT on 7/30 and cardiology saw the patient in consult as below   Hospital Course:  1. Atrial flutter/SVT CHADS2 score 4 a. Seems to have occurred sometime overnight 7/29-feeling the fluttering in her chest and symptomatic therefore given metoprolol IV x1 and started metoprolol twice daily 12.5 b. Not a good candidate from my perspective for cardioversion given advanced lung disease c. Decision made by cardiology to ensure Zio patch is in place and planning for anticoagulation can be done in the outpatient setting d. Echo does not seem overly concerning for any other issues 2. And TSH magnesium are okay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  intractable cough a. Outpatient management at [facility] pulmonology as above b. Initially trialed codeine and gabapentin subsequently using mainly guaifenesin and other nebs c. Discharging on Xopenex given tachycardia today and add tiotropium instead of ipratropium d. Continue prednisone 20 daily with quick taper e. X-rays 7/29 relatively benign upper chest 4. Left total hip replacement after fall + left distal radius closed reduction a. 2-week follow-up in office per Dr.Xu WBAT LLE suture removal 8/4 at his office b. Will need Lovenox on discharge as per him 5. 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 Discussed with Dr. [name] other medical issues take precedence over  this b. Dr. [name] will be made aware and will contact the patient-office number to call is [phone], [phone] if issues 6. Squamous cell CA following at [facility] a. Patient is to have mapping apparently per patient's son on above date b. Dr. [name] is aware of the patient we will CC plan c. I will also CC [name] who is a oncology navigator to ensure patient is not lost follow-up-appreciate input thank you 7. DM TY A1c 5.8-not on insulin prior to admission a. Resume Actos 7/29 b. CBG much improved with de-escalation of steroids in the 100 ranges c. Lantus and sliding scale was discontinued on discharge 8. Hypothyroid a. Needs outpatient TSH continuing at this time Synthroid 88 mcg 9. Hip fracture 11/03/2019 10. 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 hemoglobin down-it appears she received transfusion earlier in admission b. Transfusion threshold not met but iron studies show significant very low saturations therefore Feraheme administered 729 c. Will need recheck iron studies soon  Procedures:  Hip repair 722 Dr. [name] (i.e. Studies not automatically included, echos, thoracentesis, etc; not x-rays)  Consultations:  Orthopedics  Cardiology  Discharge Exam: Vitals:   01/09/20 0716 01/09/20 0901  BP: (!) 108/47   Pulse: 77 86  Resp: 15   Temp: 98.4 F (36.9 C)   SpO2: 100%     General: Awake alert coherent quite anxious on oxygen no distress Cardiovascular: S1-S2 no murmur rub or gallop and predominant sinus rhythm Respiratory: Mild wheeze no rales rhonchi taking good deep breaths without any specific accessory muscle use Abdomen soft nontender Arm in sling postop changes to the lower extremity Neurologically intact  Discharge Instructions   Discharge Instructions    Diet - low sodium heart healthy   Complete by: As directed    Increase activity slowly   Complete by: As directed    No wound care   Complete by: As directed    Weight  bearing as tolerated   Complete by: As directed      Allergies as of 01/09/2020      Reactions   Penicillins Diarrhea   Has patient had a PCN reaction causing immediate rash, facial/tongue/throat swelling, SOB or lightheadedness with hypotension: Unknown Has patient had a PCN reaction causing severe rash involving mucus membranes or skin necrosis: Unknown Has patient had a PCN reaction that required hospitalization: No  Has patient had a PCN reaction occurring within the last 10 years: No  If all of the above answers are "NO", then may proceed with Cephalosporin use. Cephalosporins ok   Influenza Vaccines Other (See Comments)   Severe nausea, vomiting and body aches-md told her not to take it anymore   Clarithromycin    Unknown reaction    Cortisone Other (See Comments)   "Made her feel like ice water was running through her veins"    Povidone-iodine    Unknown reaction       Medication List    STOP taking these medications   calcium carbonate 500 MG chewable tablet Commonly known as: TUMS - dosed in mg elemental calcium   celecoxib 200 MG capsule Commonly known as: CELEBREX   HYDROcodone-acetaminophen 5-325 MG tablet Commonly known as: NORCO/VICODIN   menthol-cetylpyridinium 3 MG lozenge Commonly known as: CEPACOL   phenol 1.4 % Liqd Commonly known as: CHLORASEPTIC   polyvinyl alcohol 1.4 % ophthalmic solution Commonly known as: LIQUIFILM TEARS  rivaroxaban 10 MG Tabs tablet Commonly known as: XARELTO   verapamil 120 MG tablet Commonly known as: CALAN     TAKE these medications   ALPRAZolam 0.5 MG tablet Commonly known as: XANAX Take 1 tablet (0.5 mg total) by mouth 2 (two) times daily as needed for anxiety.   benzonatate 100 MG capsule Commonly known as: TESSALON TAKE 1 CAPSULE (100 MG TOTAL) BY MOUTH EVERY 8 (EIGHT) HOURS AS NEEDED FOR COUGH. What changed: when to take this   Biotin 10000 MCG Tabs Take 10,000 mcg by mouth daily.   CALCIUM 1200+D3  PO Take 1 tablet by mouth at bedtime.   clorazepate 3.75 MG tablet Commonly known as: TRANXENE TAKE 1 TABLET BY MOUTH DAILY.MAY TAKE UP TO 2 DAILY IF NEEDED What changed:   how much to take  how to take this  when to take this  additional instructions   cyclobenzaprine 10 MG tablet Commonly known as: FLEXERIL Take 10 mg by mouth at bedtime.   dextromethorphan-guaiFENesin 30-600 MG 12hr tablet Commonly known as: MUCINEX DM Take 2 tablets by mouth 2 (two) times daily.   docusate sodium 100 MG capsule Commonly known as: COLACE Take 1 capsule (100 mg total) by mouth 2 (two) times daily. What changed: when to take this   enoxaparin 40 MG/0.4ML injection Commonly known as: LOVENOX Inject 0.4 mLs (40 mg total) into the skin daily.   Ensure Max Protein Liqd Take 330 mLs (11 oz total) by mouth daily for 7 days.   feeding supplement (GLUCERNA SHAKE) Liqd Take 237 mLs by mouth 3 (three) times daily between meals for 7 days.   ferrous sulfate 325 (65 FE) MG tablet Take 1 tablet (325 mg total) by mouth daily with breakfast.   levalbuterol 0.63 MG/3ML nebulizer solution Commonly known as: XOPENEX Take 3 mLs (0.63 mg total) by nebulization 2 (two) times daily.   levothyroxine 88 MCG tablet Commonly known as: SYNTHROID Take 88 mcg by mouth daily before breakfast.   metoprolol tartrate 25 MG tablet Commonly known as: LOPRESSOR Take 0.5 tablets (12.5 mg total) by mouth 2 (two) times daily.   multivitamin tablet Take 1 tablet by mouth daily.   oxyCODONE-acetaminophen 5-325 MG tablet Commonly known as: Percocet Take 1-2 tablets by mouth every 8 (eight) hours as needed for severe pain.   pioglitazone 15 MG tablet Commonly known as: ACTOS Take 15 mg by mouth daily.   pravastatin 40 MG tablet Commonly known as: PRAVACHOL TAKE 1 TABLET BY MOUTH EVERY DAY IN THE EVENING What changed:   how much to take  how to take this  when to take this   predniSONE 20 MG  tablet Commonly known as: DELTASONE Take 1 tablet (20 mg total) by mouth daily before breakfast. Start taking on: January 10, 2020   prochlorperazine 10 MG tablet Commonly known as: COMPAZINE Take 1 tablet (10 mg total) by mouth every 6 (six) hours as needed for nausea or vomiting.   Trintellix 10 MG Tabs tablet Generic drug: vortioxetine HBr Take 1 tablet (10 mg total) by mouth daily.   umeclidinium bromide 62.5 MCG/INH Aepb Commonly known as: INCRUSE ELLIPTA Inhale 1 puff into the lungs daily.   vitamin C 1000 MG tablet Take 1,000 mg by mouth at bedtime.            Discharge Care Instructions  (From admission, onward)         Start     Ordered   12/31/19 0000  Weight bearing as  tolerated        12/31/19 1849         Allergies  Allergen Reactions  . Penicillins Diarrhea    Has patient had a PCN reaction causing immediate rash, facial/tongue/throat swelling, SOB or lightheadedness with hypotension: Unknown Has patient had a PCN reaction causing severe rash involving mucus membranes or skin necrosis: Unknown Has patient had a PCN reaction that required hospitalization: No  Has patient had a PCN reaction occurring within the last 10 years: No  If all of the above answers are "NO", then may proceed with Cephalosporin use. Cephalosporins ok  . Influenza Vaccines Other (See Comments)    Severe nausea, vomiting and body aches-md told her not to take it anymore  . Clarithromycin     Unknown reaction   . Cortisone Other (See Comments)    "Made her feel like ice water was running through her veins"   . Povidone-Iodine     Unknown reaction     Contact information for follow-up providers    Leandrew Koyanagi, MD In 2 weeks.   Specialty: Orthopedic Surgery Why: For suture removal, For wound re-check Contact information: [place] [address] [phone]        [name], MD. Call in 1 day(s).   Specialty: Family Medicine Why: PLease call for  a post hospital follow up appointment Contact information: [place] [address] [phone]        Sanda Klein, MD .   Specialty: Cardiology Contact information: [address] [place] [address] [phone]            Contact information for after-discharge care    Destination    [facility]erred [facility] .   Service: Skilled Nursing Contact information: [place] [phone]                   The results of significant diagnostics from this hospitalization (including imaging, microbiology, ancillary and laboratory) are listed below for reference.    Significant Diagnostic Studies: DG Wrist 2 Views Left  Result Date: 01/02/2020 CLINICAL DATA:  Fall, wrist fracture, post reduction imaging EXAM: LEFT WRIST - 2 VIEW COMPARISON:  None. FINDINGS: Two view radiograph of the left wrist is performed within an external immobilizer which obscures fine bony detail. Impacted distal radial fracture now appears disimpacted to length and demonstrates neutral tilt of the distal radial surface and appropriate radial inclination. There is intra-articular extension of a longitudinal fracture plane likely into the lunate fossa with residual ulnar displacement of the fracture fragment. Ulnar styloid fracture again noted, distracted medial displaced but anatomically aligned at this point. IMPRESSION: Post reduction alignment as described above. Electronically Signed   By: Fidela Salisbury MD   On: 01/02/2020 22:58   DG Wrist 2 Views Left  Result Date: 12/31/2019 CLINICAL DATA:  Close reduction left wrist fracture EXAM: LEFT WRIST - 2 VIEW COMPARISON:  12/31/2019 at 4:36 a.m. FINDINGS: 2 fluoroscopic images are obtained during the performance of the procedure and are provided for interpretation only. There is been reduction of the impacted distal left radial fracture seen previously, now with near anatomic  alignment. Displaced ulnar styloid fracture unchanged. Please refer to the operative report. FLUOROSCOPY TIME:  4 seconds, 2 images IMPRESSION: 1. Reduction of the impacted distal left radial fracture with near anatomic alignment. 2. Stable ulnar styloid fracture. Electronically Signed   By: Randa Ngo M.D.   On:  12/31/2019 19:55   DG Wrist 2 Views Left  Result Date: 12/31/2019 CLINICAL DATA:  Fall with injury to left wrist. EXAM: LEFT WRIST - 2 VIEW COMPARISON:  Prior report 07/07/1999. FINDINGS: Angulated fracture of the distal left radius. This appears to be old and was previously described on prior study of 07/07/1999. A follow-up exam in 7-10 days may prove useful to ensure stable appearance. Displaced fracture of the ulnar styloid. This appears acute. Diffuse degenerative change. Diffuse soft tissue swelling. No radiopaque foreign body. IMPRESSION: 1. Angulated fracture of the distal left radius. This appears to be old was previously described on prior study of 07/07/1999. A follow-up exam in 7-10 days may prove useful to ensure stable appearance. 2.  Displaced fracture of the ulnar styloid.  This appears acute. 3.  Diffuse degenerative change.  Diffuse soft tissue swelling. Electronically Signed   By: Marcello Moores  Register   On: 12/31/2019 05:33   CT Head Wo Contrast  Result Date: 12/31/2019 CLINICAL DATA:  Facial trauma.  Fell on [employer] EXAM: CT HEAD WITHOUT CONTRAST CT CERVICAL SPINE WITHOUT CONTRAST TECHNIQUE: Multidetector CT imaging of the head and cervical spine was performed following the standard protocol without intravenous contrast. Multiplanar CT image reconstructions of the cervical spine were also generated. COMPARISON:  Head MRI 12/08/2019.  Neck CTA 06/11/2010. FINDINGS: CT HEAD FINDINGS Brain: There is no evidence of acute infarct, intracranial hemorrhage, mass, midline shift, or extra-axial fluid collection. There is mild cerebral atrophy with unchanged enlargement of the extra-axial  CSF spaces over the frontal convexities. Hypodensities in the cerebral white matter bilaterally are nonspecific but compatible with mild chronic small vessel ischemic disease. Vascular: Calcified atherosclerosis at the skull base. 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 Skull: No fracture suspicious osseous lesion. Sinuses/Orbits: Chronic left maxillary sinusitis with essentially complete opacification of the sinus. Clear mastoid air cells. Unremarkable orbits. Other: None. CT CERVICAL SPINE FINDINGS Alignment: Mild reversal of the normal cervical lordosis. Trace anterolisthesis of C3 on C4. Skull base and vertebrae: No acute fracture or suspicious osseous lesion. Soft tissues and spinal canal: No prevertebral fluid or swelling. No visible canal hematoma. Disc levels: Moderate disc space narrowing at C5-6 with asymmetric left uncovertebral spurring resulting in mild left neural foraminal stenosis. Severe facet arthrosis on the right at C2-3 and on the left at C3-4 and C4-5. Upper chest: Biapical pleuroparenchymal lung scarring, centrilobular emphysema, and biapical nodular densities similar to a 12/08/2019 PET-CT. Other: Mild calcified atherosclerosis at the left carotid bifurcation. IMPRESSION: 1. No evidence of acute intracranial abnormality. 2. Mild chronic small vessel ischemic disease. 3. Known 1 cm right supraclinoid ICA aneurysm. 4. No acute cervical spine fracture. 5. Emphysema (ICD10-J43.9). Electronically Signed   By: Logan Bores M.D.   On: 12/31/2019 06:12   CT Cervical Spine Wo Contrast  Result Date: 12/31/2019 CLINICAL DATA:  Facial trauma.  Fell on [employer] EXAM: CT HEAD WITHOUT CONTRAST CT CERVICAL SPINE WITHOUT CONTRAST TECHNIQUE: Multidetector CT imaging of the head and cervical spine was performed following the standard protocol without intravenous contrast. Multiplanar CT image reconstructions of the cervical spine were also  generated. COMPARISON:  Head MRI 12/08/2019.  Neck CTA 06/11/2010. FINDINGS: CT HEAD FINDINGS Brain: There is no evidence of acute infarct, intracranial hemorrhage, mass, midline shift, or extra-axial fluid collection. There is mild cerebral atrophy with unchanged enlargement of the extra-axial CSF spaces over the frontal convexities. Hypodensities in the cerebral white matter bilaterally are  nonspecific but compatible with mild chronic small vessel ischemic disease. Vascular: Calcified atherosclerosis at the skull base. 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 Skull: No fracture suspicious osseous lesion. Sinuses/Orbits: Chronic left maxillary sinusitis with essentially complete opacification of the sinus. Clear mastoid air cells. Unremarkable orbits. Other: None. CT CERVICAL SPINE FINDINGS Alignment: Mild reversal of the normal cervical lordosis. Trace anterolisthesis of C3 on C4. Skull base and vertebrae: No acute fracture or suspicious osseous lesion. Soft tissues and spinal canal: No prevertebral fluid or swelling. No visible canal hematoma. Disc levels: Moderate disc space narrowing at C5-6 with asymmetric left uncovertebral spurring resulting in mild left neural foraminal stenosis. Severe facet arthrosis on the right at C2-3 and on the left at C3-4 and C4-5. Upper chest: Biapical pleuroparenchymal lung scarring, centrilobular emphysema, and biapical nodular densities similar to a 12/08/2019 PET-CT. Other: Mild calcified atherosclerosis at the left carotid bifurcation. IMPRESSION: 1. No evidence of acute intracranial abnormality. 2. Mild chronic small vessel ischemic disease. 3. Known 1 cm right supraclinoid ICA aneurysm. 4. No acute cervical spine fracture. 5. Emphysema (ICD10-J43.9). Electronically Signed   By: Logan Bores M.D.   On: 12/31/2019 06:12   CT WRIST LEFT WO CONTRAST  Result Date: 12/31/2019 CLINICAL DATA:  Evaluate left wrist  fracture EXAM: CT OF THE LEFT WRIST WITHOUT CONTRAST TECHNIQUE: Multidetector CT imaging was performed according to the standard protocol. Multiplanar CT image reconstructions were also generated. COMPARISON:  X-ray 12/31/2019 FINDINGS: Technical note: Examination is degraded by beam hardening artifact related to patient positioning of the wrist overlying the abdomen. Bones/Joint/Cartilage Acute impacted fracture of the distal radial metaphysis with intra-articular extension to the radiocarpal joint. Approximately 6 mm of impaction. Large dorsal fracture fragment of the distal radius is displaced dorsally up to 5 mm resulting in significant radiocarpal articular surface diastasis (series 4, image 21). Acute mildly displaced ulnar styloid fracture (series 5, image 22) Carpal bones appear intact without fracture or malalignment. Ligaments Suboptimally assessed by CT. Muscles and Tendons Suboptimally evaluated given the degree of beam hardening. Soft tissues Diffuse soft tissue swelling the level of the wrist. No well-defined fluid collection. IMPRESSION: 1. Acute impacted fracture of the distal radial metaphysis with intra-articular extension to the radiocarpal joint. 2. Dominant dorsal fracture fragment of the distal radius is displaced dorsally up to 5 mm with resultant radiocarpal articular surface diastasis. 3. Acute mildly displaced ulnar styloid fracture. Electronically Signed   By: Davina Poke D.O.   On: 12/31/2019 13:23   Pelvis Portable  Result Date: 12/31/2019 CLINICAL DATA:  Left hip arthroplasty, left hip pain and immobility EXAM: PORTABLE PELVIS 1-2 VIEWS COMPARISON:  12/31/2019 FINDINGS: Supine frontal view of the lower pelvis and bilateral hips was performed, the iliac crests and sacrum are excluded by collimation. Chronic right hip arthroplasty remains in anatomic alignment. The left hip arthroplasty is unchanged in position from intraoperative exam performed earlier. Postsurgical changes are  seen within the soft tissues surrounding the left hip. There are no acute bony abnormalities. IMPRESSION: 1. Unremarkable bilateral hip arthroplasties as above. Electronically Signed   By: Randa Ngo M.D.   On: 12/31/2019 20:35   DG CHEST PORT 1 VIEW  Result Date: 01/07/2020 CLINICAL DATA:  Heart failure. EXAM: PORTABLE CHEST 1 VIEW COMPARISON:  01/02/2020, 05/13/2018.  CT chest 11/04/2019. FINDINGS: Mediastinum appears stable. Heart size appears stable. Venous congestion. Chronic interstitial changes are again noted bilaterally. Left upper lung prominent mass is again  noted without interim change. No prominent pleural effusion. Biapical pleural thickening consistent scarring. No pneumothorax. No acute bony abnormality. IMPRESSION: 1.  Large left upper lung mass again noted without interim change. 2. Chronic interstitial lung disease again noted. 3. Heart size stable. No pulmonary venous congestion. Electronically Signed   By: Marcello Moores  Register   On: 01/07/2020 12:16   DG CHEST PORT 1 VIEW  Result Date: 01/02/2020 CLINICAL DATA:  Cough EXAM: PORTABLE CHEST 1 VIEW COMPARISON:  12/31/2019, 12/08/2019 FINDINGS: Single frontal view of the chest demonstrates large left upper lobe mass compatible with malignancy. No significant change since prior study. Diffuse interstitial prominence, scarring, and fibrosis again noted throughout the lungs unchanged. Cardiac silhouette is stable. No acute bony abnormality. IMPRESSION: 1. Stable left upper lobe mass compatible with malignancy. 2. Diffuse pulmonary scarring and fibrosis, with no new airspace disease. Electronically Signed   By: Randa Ngo M.D.   On: 01/02/2020 20:50   DG Chest Portable 1 View  Result Date: 12/31/2019 CLINICAL DATA:  Fall. EXAM: PORTABLE CHEST 1 VIEW COMPARISON:  CT chest 11/04/2019, 05/14/2018. Chest x-ray 11/03/2019, 05/13/2018. PET CT 06/05/2018. FINDINGS: Mediastinum 1. Hilar structures are stable. Large mass left upper lung again  noted again concerning for a neoplasm. Chronic interstitial lung disease again noted without interim change. Tiny left pleural effusion cannot be excluded. Biapical pleural thickening again noted consistent scarring. Heart size stable. Surgical clips right upper quadrant. Degenerative change thoracic spine. No acute bony abnormality identified. IMPRESSION: 1. Large mass left upper lung again noted. Although infectious etiology cannot be excluded. This finding is concerning for a neoplasm such as bronchogenic carcinoma. Similar findings noted on prior exam. Tiny left pleural effusion cannot be excluded. 2. Chronic interstitial lung disease again noted without interim change. 3. No acute bony abnormality identified. No evidence of pneumothorax. Electronically Signed   By: Marcello Moores  Register   On: 12/31/2019 05:26   DG Knee Left Port  Result Date: 12/31/2019 CLINICAL DATA:  Pain EXAM: PORTABLE LEFT KNEE - 1-2 VIEW COMPARISON:  None. FINDINGS: There is no acute fracture or dislocation. Osseous mineralization is decreased. There are tricompartmental changes of osteoarthritis, greatest in the patellofemoral compartment. No joint effusion. IMPRESSION: No acute osseous abnormality or joint effusion.  Osteoarthritis. Electronically Signed   By: Macy Mis M.D.   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 There are expected postsurgical changes. The alignment is near anatomic. There is no evidence for a periprosthetic fracture. IMPRESSION: Status post total hip arthroplasty on the left. Electronically Signed   By: Constance Holster M.D.   On: 12/31/2019 19:57   DG HIP PORT UNILAT  WITH PELVIS 1V LEFT  Result Date: 12/31/2019 CLINICAL DATA:  Fall with injury to left hip. EXAM: DG HIP (WITH OR WITHOUT PELVIS) 1V PORT LEFT COMPARISON:  PET-CT 12/08/2019. FINDINGS: Diffuse osteopenia. Degenerative changes lumbar spine and left hip. Total right hip replacement. Hardware intact. Anatomic alignment. Angulated comminuted fracture of the left femoral neck noted. No evidence of dislocation. IMPRESSION: Angulated comminuted fracture of the left femoral neck. Electronically Signed   By: Marcello Moores  Register   On: 12/31/2019 05:29   DG HIP OPERATIVE [facility]  OR W/O PELVIS LEFT  Result Date: 12/31/2019 CLINICAL DATA:  Hip replacement EXAM: OPERATIVE LEFT HIP (WITH PELVIS IF PERFORMED) 3 VIEWS TECHNIQUE: Fluoroscopic spot image(s) were submitted for interpretation post-operatively. 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 There are expected postsurgical changes. The alignment is near anatomic. There is no evidence for a periprosthetic fracture. IMPRESSION: Status post total hip arthroplasty on the left. Electronically Signed   By: Constance Holster M.D.   On: 12/31/2019 19:56   ECHOCARDIOGRAM LIMITED  Result Date: 01/08/2020    ECHOCARDIOGRAM LIMITED REPORT   Patient Name:   [name] Date of Exam: 01/08/2020 Medical Rec #:  [id number]      Height:       65.0 in Accession #:    [id number]     Weight:       140.9 lb Date of Birth:  [birth year]       BSA:          1.704 m Patient Age:    79 years       BP:           111/61 mmHg Patient Gender: F              HR:           138 bpm. Exam Location:  Inpatient Procedure: Limited Echo, Cardiac Doppler and Color Doppler STAT ECHO Indications:    abnormal ecg 794.31  History:        Patient has prior history of Echocardiogram examinations, most                 recent 11/05/2019. Cancer; Risk Factors:Diabetes and                 Dyslipidemia.  Sonographer:    Johny Chess Referring Phys: [id number]  Farrell  1. Left ventricular ejection fraction, by estimation, is 60 to 65%. The left ventricle has normal function. The left ventricle has no regional wall motion abnormalities. Left ventricular diastolic parameters are indeterminate.  2. Right ventricular systolic function was not well visualized. The right ventricular size is not well visualized. Tricuspid regurgitation signal is inadequate for assessing PA pressure.  3. The mitral valve is normal in structure. Trivial mitral valve regurgitation. No evidence of mitral stenosis.  4. The aortic valve is normal in structure. Aortic valve regurgitation is trivial. No aortic stenosis is present. FINDINGS  Left Ventricle: Left ventricular ejection fraction, by estimation, is 60 to 65%. The left ventricle has normal function. The left ventricle has no regional wall motion abnormalities. The left ventricular internal cavity size was normal in size. There is  no left ventricular hypertrophy. Right Ventricle: The right ventricular size is not well visualized. Right vetricular wall thickness was not assessed. Right ventricular systolic function was not well visualized. Tricuspid regurgitation signal is inadequate for assessing PA pressure. Left Atrium: Left atrial size was normal in size. Right Atrium: Right atrial size was normal in size. Pericardium: There is no evidence of pericardial effusion. Mitral Valve: The mitral valve is normal in structure. Normal mobility of the mitral valve leaflets. Mild mitral annular calcification. Trivial mitral valve regurgitation. No evidence of mitral valve stenosis. Tricuspid Valve: The tricuspid valve is normal in structure. Tricuspid valve regurgitation is mild . No evidence of tricuspid stenosis. Aortic Valve: The aortic valve is normal in structure. Aortic valve regurgitation is trivial. No aortic stenosis is present. Pulmonic Valve: The pulmonic valve  was normal in structure. Pulmonic valve regurgitation is  trivial. No evidence of pulmonic stenosis. Aorta: The aortic root is normal in size and structure. Venous: The inferior vena cava was not well visualized. IAS/Shunts: No atrial level shunt detected by color flow Doppler. LEFT VENTRICLE PLAX 2D LVIDd:         3.50 cm LVIDs:         2.10 cm LV PW:         0.80 cm LV IVS:        0.70 cm LVOT diam:     1.90 cm LVOT Area:     2.84 cm  LEFT ATRIUM         Index LA diam:    2.30 cm 1.35 cm/m   AORTA Ao Root diam: 3.30 cm Ao Asc diam:  2.70 cm  SHUNTS Systemic Diam: 1.90 cm Fransico Him MD Electronically signed by Fransico Him MD Signature Date/Time: 01/08/2020/3:22:09 PM    Final     Microbiology: Recent Results (from the past 240 hour(s))  SARS Coronavirus 2 by RT PCR (hospital order, performed in [place] hospital lab) Nasopharyngeal Nasopharyngeal Swab     Status: None   Collection Time: 12/31/19  3:30 AM   Specimen: Nasopharyngeal Swab  Result Value Ref Range Status   SARS Coronavirus 2 NEGATIVE NEGATIVE Final    Comment: (NOTE) SARS-CoV-2 target nucleic acids are NOT DETECTED.  The SARS-CoV-2 RNA is generally detectable in upper and lower respiratory specimens during the acute phase of infection. The lowest concentration of SARS-CoV-2 viral copies this assay can detect is 250 copies / mL. A negative result does not preclude SARS-CoV-2 infection and should not be used as the sole basis for treatment or other patient management decisions.  A negative result may occur with improper specimen collection / handling, submission of specimen other than nasopharyngeal swab, presence of viral mutation(s) within the areas targeted by this assay, and inadequate number of viral copies (<250 copies / mL). A negative result must be combined with clinical observations, patient history, and epidemiological information.  Fact Sheet for Patients:   StrictlyIdeas.no  Fact Sheet for Healthcare  Providers: BankingDealers.co.za  This test is not yet approved or  cleared by the [place] FDA and has been authorized for detection and/or diagnosis of SARS-CoV-2 by FDA under an Emergency Use Authorization (EUA).  This EUA will remain in effect (meaning this test can be used) for the duration of the COVID-19 declaration under Section 564(b)(1) of the Act, 21 U.S.C. section 360bbb-3(b)(1), unless the authorization is terminated or revoked sooner.  Performed at [facility], [place] [address] [place], [place] [address]      Labs: Basic Metabolic Panel: Recent Labs  Lab 01/05/20 0257 01/06/20 0332 01/07/20 0306 01/08/20 0420 01/08/20 1443 01/09/20 0236  NA 133* 136 137 137  --  136  K 3.3* 4.8 4.7 4.0  --  4.0  CL 95* 97* 99 96*  --  96*  CO2 _0 --  31  GLUCOSE 129* 190* 149* 105*  --  123*  BUN 7* _1 --  19  CREATININE 0.42* 0.51 0.46 0.45  --  0.52  CALCIUM 8.9 9.0 9.1 10.0  --  10.1  MG 1.5*  --   --   --  1.9 1.8  PHOS 2.7  --   --   --   --   --    Liver Function Tests: Recent Labs  Lab 01/05/20 0257  AST 21  ALT 9  ALKPHOS 93  BILITOT 0.5  PROT 6.2*  ALBUMIN 1.7*   No results for input(s): LIPASE, AMYLASE in the last 168 hours. No results for input(s): AMMONIA in the last 168 hours. CBC: Recent Labs  Lab 01/05/20 0257 01/06/20 0332 01/07/20 0306 01/08/20 0420 01/09/20 0236  WBC 7.3 8.1 11.8* 11.7* 12.5*  NEUTROABS 4.9  --  8.8* 8.4* 9.2*  HGB 7.8* 7.6* 8.1* 8.5* 9.2*  HCT 25.5* 24.7* 25.8* 28.1* 30.3*  MCV 88.9 88.8 88.4 90.9 91.0  PLT 259 289 323 384 408*   Cardiac Enzymes: No results for input(s): CKTOTAL, CKMB, CKMBINDEX, TROPONINI in the last 168 hours. BNP: BNP (last 3 results) No results for input(s): BNP in the last 8760 hours.  ProBNP (last 3 results) No results for input(s): PROBNP in the last 8760 hours.  CBG: Recent Labs  Lab 01/07/20 2205 01/08/20 0729  01/08/20 1205 01/08/20 1741 01/09/20 0743  GLUCAP 192* 138* 168* 188* 161*       Signed:  Nita Sells MD   [facility] 01/09/2020, 10:37 AM

## 2020-01-10 LAB — GLUCOSE, CAPILLARY
Glucose-Capillary: 167 mg/dL — ABNORMAL HIGH (ref 70–99)
Glucose-Capillary: 133 mg/dL — ABNORMAL HIGH (ref 70–99)

## 2020-01-10 LAB — SARS CORONAVIRUS 2 (TAT 6-24 HRS): SARS Coronavirus 2: NEGATIVE

## 2020-01-10 NOTE — Progress Notes (Signed)
Please see my addended discharge summary as below She is a little anxious about leaving the hospital which is understandable however I think she is maximally optimized She has no chest pain her wheeze sounds better her heart rate is well controlled at this time and has not had a recurrence on the monitors of any arrhythmia Care team updated including Dr. [name] to coordinate outpatient follow-ups  Verneita Griffes, MD [facility] 9:14 AM

## 2020-01-10 NOTE — Discharge Instructions (Signed)
Acute Pain, Adult Acute pain is a type of sudden pain that may last for just a few days or for as long as six months. It is often related to an illness, injury, or medical procedure. Acute pain may be mild, moderate, or severe. Pain can make it hard for you to do your normal, daily activities. It can cause anxiety and lead to other problems if it is left untreated. Treatment depends on the cause and severity of your pain. Acute pain usually goes away once your injury has healed or you are no longer ill. Follow these instructions at home: Medicines   Take over-the-counter and prescription medicines only as told by your health care provider.  Take the lowest dose of medicine for the shortest amount of time needed to relieve the pain.  If you are taking prescription pain medicine: ? Do not stop taking the medicine suddenly. Talk to your health care provider about how and when to discontinue prescription medicine. ? Do not take more pills than told by your health care provider even if your pain is severe. ? Do not take other over-the-counter pain medicines in addition to prescription pain medicine unless told by your health care provider. ? Ask your health care provider if the medicine requires you to avoid driving or using heavy machinery. ? Ask your health care provider if the medicine can cause constipation. You may need to take these actions to prevent or treat constipation:  Drink enough fluid to keep your urine pale yellow.  Eat foods that are high in fiber, such as beans, whole grains, and fresh fruits and vegetables.  Take over-the-counter or prescription medicines.  Limit foods that are high in fat and processed sugars, such as fried or sweet foods. Managing pain, stiffness, and swelling     If directed, put ice on the affected area. To do this:  Put ice in a plastic bag.  Place a towel between your skin and the bag.  Leave the ice on for 20 minutes, 2-3 times a day. If  directed, apply heat to the affected area as often as told by your health care provider. Use the heat source that your health care provider recommends, such as a moist heat pack or a heating pad.  Place a towel between your skin and the heat source.  Leave the heat on for 20-30 minutes.  Remove the heat if your skin turns bright red. This is especially important if you are unable to feel pain, heat, or cold. You may have a greater risk of getting burned.  Activity  Rest as told by your health care provider.  Return to your normal activities as told by your health care provider. Ask your health care provider what activities are safe for you. General instructions  Check your pain level as told by your health care provider.  Ask your health care provider if other strategies such as distraction, relaxation, or physical therapies can help your pain.  Keep all follow-up visits as told by your health care provider. This is important. Contact a health care provider if:  Your pain is not controlled by medicine.  Your pain does not improve or gets worse.  You have side effects from pain medicines, such as vomiting or confusion. Get help right away if you:  Have severe pain.  Have trouble breathing.  Lose consciousness.  Have chest pain or pressure that lasts for more than a few minutes, or if you have other symptoms along with chest  pain, including if you: ? Have pain or discomfort in one or both arms, your back, neck, jaw, or stomach. ? Have shortness of breath. ? Break out in a cold sweat. ? Feel nauseous. ? Become light-headed. These symptoms may represent a serious problem that is an emergency. Do not wait to see if the symptoms will go away. Get medical help right away. Call your local emergency services (911 in the [place] Do not drive yourself to the hospital. Summary  Acute pain may be mild, moderate, or severe. It usually goes away once your injury has healed or you are no  longer ill.  Take over-the-counter and prescription medicines only as told by your health care provider.  Ask your health care provider if the medicine prescribed to you can cause constipation.  Contact a health care provider if your pain is not controlled by medicine. This information is not intended to replace advice given to you by your health care provider. Make sure you discuss any questions you have with your health care provider. Document Revised: 10/13/2018 Document Reviewed: 10/13/2018 Elsevier Patient Education  Faith items at home which could result in a fall. This includes throw rugs or furniture in walking pathways o ICE to the affected joint every three hours while awake for 30 minutes at a time, for at least the first 3-5 days, and then as needed for pain and swelling.  Continue to use ice for pain and swelling. You may notice swelling that will progress down to the foot and ankle.  This is normal after surgery.  Elevate your leg when you are not up walking on it.   o Continue to use the breathing machine you got in the hospital (incentive spirometer) which will help keep your temperature down.  It is common for your temperature to cycle up and down following surgery, especially at night when you are not up moving around and exerting yourself.  The breathing machine keeps your lungs expanded and your temperature down.   DIET:  As you were doing prior to hospitalization, we recommend a well-balanced diet.  DRESSING / WOUND CARE / SHOWERING  You may change your surgical dressing 7 days after surgery.  Then change the dressing every day with sterile gauze.  Please use good hand washing techniques before changing the dressing.  Do not use any lotions or creams on the incision until instructed by your surgeon.  You may shower while you have the surgical dressing which is waterproof.  After removal of surgical dressing,  you must cover the incision when showering.  ACTIVITY  o Increase activity slowly as tolerated, but follow the weight bearing instructions below.   o No driving for 6 weeks or until further direction given by your physician.  You cannot drive while taking narcotics.  o No lifting or carrying greater than 10 lbs. until further directed by your surgeon. o Avoid periods of inactivity such as sitting longer than an hour when not asleep. This helps prevent blood clots.  o You may return to work once you are authorized by your doctor.     WEIGHT BEARING   Weight bearing as tolerated with assist device (walker, cane, etc) as directed, use it as long as suggested by your surgeon or therapist, typically at least 4-6 weeks.   EXERCISES  Results after joint replacement surgery are often greatly improved when you follow the exercise, range of motion and muscle  strengthening exercises prescribed by your doctor. Safety measures are also important to protect the joint from further injury. Any time any of these exercises cause you to have increased pain or swelling, decrease what you are doing until you are comfortable again and then slowly increase them. If you have problems or questions, call your caregiver or physical therapist for advice.   Rehabilitation is important following a joint replacement. After just a few days of immobilization, the muscles of the leg can become weakened and shrink (atrophy).  These exercises are designed to build up the tone and strength of the thigh and leg muscles and to improve motion. Often times heat used for twenty to thirty minutes before working out will loosen up your tissues and help with improving the range of motion but do not use heat for the first two weeks following surgery (sometimes heat can increase post-operative swelling).   These exercises can be done on a training (exercise) mat, on the floor, on a table or on a bed. Use whatever works the best and is most  comfortable for you.    Use music or television while you are exercising so that the exercises are a pleasant break in your day. This will make your life better with the exercises acting as a break in your routine that you can look forward to.   Perform all exercises about fifteen times, three times per day or as directed.  You should exercise both the operative leg and the other leg as well.  Exercises include:   . Quad Sets - Tighten up the muscle on the front of the thigh (Quad) and hold for 5-10 seconds.   . Straight Leg Raises - With your knee straight (if you were given a brace, keep it on), lift the leg to 60 degrees, hold for 3 seconds, and slowly lower the leg.  Perform this exercise against resistance later as your leg gets stronger.  . Leg Slides: Lying on your back, slowly slide your foot toward your buttocks, bending your knee up off the floor (only go as far as is comfortable). Then slowly slide your foot back down until your leg is flat on the floor again.  Glenard Haring Wings: Lying on your back spread your legs to the side as far apart as you can without causing discomfort.  . Hamstring Strength:  Lying on your back, push your heel against the floor with your leg straight by tightening up the muscles of your buttocks.  Repeat, but this time bend your knee to a comfortable angle, and push your heel against the floor.  You may put a pillow under the heel to make it more comfortable if necessary.   A rehabilitation program following joint replacement surgery can speed recovery and prevent re-injury in the future due to weakened muscles. Contact your doctor or a physical therapist for more information on knee rehabilitation.    CONSTIPATION  Constipation is defined medically as fewer than three stools per week and severe constipation as less than one stool per week.  Even if you have a regular bowel pattern at home, your normal regimen is likely to be disrupted due to multiple reasons following  surgery.  Combination of anesthesia, postoperative narcotics, change in appetite and fluid intake all can affect your bowels.   YOU MUST use at least one of the following options; they are listed in order of increasing strength to get the job done.  They are all available over the counter, and  you may need to use some, POSSIBLY even all of these options:    Drink plenty of fluids (prune juice may be helpful) and high fiber foods Colace 100 mg by mouth twice a day  Senokot for constipation as directed and as needed Dulcolax (bisacodyl), take with full glass of water  Miralax (polyethylene glycol) once or twice a day as needed.  If you have tried all these things and are unable to have a bowel movement in the first 3-4 days after surgery call either your surgeon or your primary doctor.    If you experience loose stools or diarrhea, hold the medications until you stool forms back up.  If your symptoms do not get better within 1 week or if they get worse, check with your doctor.  If you experience "the worst abdominal pain ever" or develop nausea or vomiting, please contact the office immediately for further recommendations for treatment.   ITCHING:  If you experience itching with your medications, try taking only a single pain pill, or even half a pain pill at a time.  You can also use Benadryl over the counter for itching or also to help with sleep.   TED HOSE STOCKINGS:  Use stockings on both legs until for at least 2 weeks or as directed by physician office. They may be removed at night for sleeping.  MEDICATIONS:  See your medication summary on the "After Visit Summary" that nursing will review with you.  You may have some home medications which will be placed on hold until you complete the course of blood thinner medication.  It is important for you to complete the blood thinner medication as prescribed.  PRECAUTIONS:  If you experience chest pain or shortness of breath - call 911 immediately  for transfer to the hospital emergency department.   If you develop a fever greater that 101 F, purulent drainage from wound, increased redness or drainage from wound, foul odor from the wound/dressing, or calf pain - CONTACT YOUR SURGEON.                                                   FOLLOW-UP APPOINTMENTS:  If you do not already have a post-op appointment, please call the office for an appointment to be seen by your surgeon.  Guidelines for how soon to be seen are listed in your "After Visit Summary", but are typically between 1-4 weeks after surgery.  OTHER INSTRUCTIONS:   Knee Replacement:  Do not place pillow under knee, focus on keeping the knee straight while resting. CPM instructions: 0-90 degrees, 2 hours in the morning, 2 hours in the afternoon, and 2 hours in the evening. Place foam block, curve side up under heel at all times except when in CPM or when walking.  DO NOT modify, tear, cut, or change the foam block in any way.  MAKE SURE YOU:  . Understand these instructions.  . Get help right away if you are not doing well or get worse.    Thank you for letting us be a part of your medical care team.  It is a privilege we respect greatly.  We hope these instructions will help you stay on track for a fast and full recovery!

## 2020-01-10 NOTE — Progress Notes (Signed)
   Progress Note  Patient Name: [name] Date of Encounter: 01/10/2020   Pt remains in [name]    No new recommendations  WIll make sure pt has f/u as outlined yesterday  WIll sign off  [facility] Cardiologist: Sanda Klein, MD  For questions or updates, please contact [facility] Please consult [email or web address] for contact info under        Signed, Dorris Carnes, MD  01/10/2020, 7:12 AM

## 2020-01-10 NOTE — TOC Progression Note (Signed)
Transition of Care [facility]) - Progression Note    Patient Details  Name: [name] MRN: [id number] Date of Birth: 1940-10-02  Transition of Care [facility]) CM/SW [place], LCSW Phone Number: [phone] 01/10/2020, 10:00 AM  Clinical Narrative:     CSW was alerted that patient was medically stable for discharge to [facility]. CSW followed up with facility and they stated that could accept patient around 1pm.  CSW spoke with patient's daughter and she stated that should would try to be at hospital to assist with patient's transition to facility.  TOC team will continue to assist with discharge planning needs. Expected Discharge Plan: Skilled Nursing Facility Barriers to Discharge: No Barriers Identified  Expected Discharge Plan and Services Expected Discharge Plan: [place]   Discharge Planning Services: CM Consult Post Acute Care Choice: [place] Living arrangements for the past 2 months: Single Family Home Expected Discharge Date: 01/10/20               DME Arranged:  (Home O2 at 2L/min, WC, RW, 3:1)           HH Agency:  (Active with [facility] in [place])         Social Determinants of Health ([place]) Interventions    Readmission Risk Interventions Readmission Risk Prevention Plan 01/01/2020  Transportation Screening Complete  PCP or Specialist Appt within 3-5 Days Complete  HRI or [place] Complete  Social Work Consult for [place] Planning/Counseling Complete  Palliative Care Screening Complete  Medication Review Press photographer) Complete  Some recent data might be hidden

## 2020-01-10 NOTE — Discharge Summary (Signed)
Physician Discharge Summary  [name] MRN:[id number] DOB: 12/04/1940 DOA: 12/31/2019  PCP: Little, Kevin, MD  Admit date: 12/31/2019 Discharge date: 01/10/2020 Patient seen and examined today 01/10/2020 and there are no acute changes to this discharge summary from 7/31-patient has been optimized from my perspective for discharge to facility to get physical therapy that is integral to her physical recovery Rest as below Time spent: 45 minutes  Recommendations for Outpatient Follow-up:  1. Will need outpatient coordination with Dr. Mohammed with regards to XRT/chemo initiation post recovery from hip surgery performed on 7/22 2. Suggest outpatient follow-up with [name] of orthopedics probably around the first week of August to ensure stability 3. Will need Chem-12, CBC in about 1 week 4. Neuro interventional radiology Dr. Deveshwar and Dr. Rodriguez are aware of her for her ICA aneurysm and this will be coordinated in terms of follow-up with them 5. Patient will need follow-up with cardiology and a Zio patch will be arranged and mailed to the patient-decision for anticoagulation is difficult and has been deferred for now given her recent fall hip fracture and high risk for bleeding based on a has bled score >4-this has to be weighed in mind with her paroxysmal A. fib and should be followed in the outpatient setting by cardiology 6. Lovenox for DVT prophylaxis at this time ending as per [name] w-- follow-up as scheduled  Discharge Diagnoses:  Active Problems:   Fracture of femoral neck, left, closed (HCC)   Protein-calorie malnutrition, severe   Closed fracture of left distal radius   Closed fracture of left wrist   Discharge Condition:fair  Diet recommendation: hh  Filed Weights   12/31/19 1442 01/07/20 0400 01/10/20 0615  Weight: 62.6 kg 63.9 kg 62.2 kg    History of present illness:  79 white female prior 45-pack-year smoker community dwelling resident Pulmonary fibrosis/ILD (with  autoimmune features-cannot use immunosuppressants given new cancer diagnosis) previously managed by Dr. Ramaswamy lost to follow-up until 2016 currently follows with [facility] pulmonology Dr. Lobo Last visit 12/28/2019 found to have pulmonary nodule Bronchoscopy 11/13/2019 = SQ [facility] L[facility]-to be followed by Dr. Weiss-at [facility]  XRT scheduled 01/15/2020 Diabetes mellitus type 2, hypothyroid, hyperlipidemia, recent blood loss anemia from surgery Sustained hip fracture 11/03/2019 discharged to [facility] rehab- Readmitted 12/31/2019 with ground-level fall acute styloid ulnar fracture, left femoral neck angulated comminuted fracture Ortho/plastic surgery consulted Underwent surgery for hip 7/22-and closed reduction with manipulation of left distal wrist fracture with sugar tong splinting by [name] She progressed to some degree but flipped into a flutter/SVT on 7/30 and cardiology saw the patient in consult as below   Hospital Course:  1. Atrial flutter/SVT CHADS2 score 4 a. Seems to have occurred sometime overnight 7/29-feeling the fluttering in her chest and symptomatic therefore given metoprolol IV x1 and started metoprolol twice daily 12.5 b. Not a good candidate from my perspective for cardioversion given advanced lung disease c. Decision made by cardiology to ensure Zio patch is in place and planning for anticoagulation can be done in the outpatient setting d. Echo does not seem overly concerning for any other issues 2. And TSH magnesium are okay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  intractable cough a. Outpatient management at [facility] pulmonology as above b. Initially trialed codeine and gabapentin subsequently using mainly guaifenesin and other nebs c. Discharging on Xopenex given tachycardia today and add tiotropium instead of ipratropium d. Continue prednisone 20 daily with quick taper e. X-rays 7/29 relatively benign upper chest 4. Left total   hip replacement after fall + left distal radius closed reduction a. 2-week  follow-up in office per Dr.Xu WBAT LLE suture removal 8/4 at his office b. Will need Lovenox on discharge as per him 5. 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 Discussed with Dr. Deveshwar-7/29-and other medical issues take precedence over this b. Dr. Demacedo Rodriguez will be made aware and will contact the patient-office number to call is [phone], [phone] if issues 6. Squamous cell CA following at [facility] a. Patient is to have mapping apparently per patient's son on above date b. Dr. Mohammed is aware of the patient we will CC plan c. I will also CC Dana Herndon who is a oncology navigator to ensure patient is not lost follow-up-appreciate input thank you 7. DM TY A1c 5.8-not on insulin prior to admission a. Resume Actos 7/29 b. CBG much improved with de-escalation of steroids in the 100 ranges c. Lantus and sliding scale was discontinued on discharge 8. Hypothyroid a. Needs outpatient TSH continuing at this time Synthroid 88 mcg 9. Hip fracture 11/03/2019 10. 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 hemoglobin down-it appears she received transfusion earlier in admission b. Transfusion threshold not met but iron studies show significant very low saturations therefore Feraheme administered 729 c. Will need recheck iron studies soon  Procedures:  Hip repair 722 [name] (i.e. Studies not automatically included, echos, thoracentesis, etc; not x-rays)  Consultations:  Orthopedics  Cardiology  Discharge Exam: Vitals:   01/10/20 0615 01/10/20 0857  BP:  (!) 114/44  Pulse:  79  Resp: 18   Temp: 97.6 F (36.4 C) 98.7 F (37.1 C)  SpO2:  99%    General: Awake alert coherent quite anxious on oxygen no distress Cardiovascular: S1-S2 no murmur rub or gallop and predominant sinus rhythm Respiratory: Mild wheeze no rales rhonchi taking good deep breaths without any specific accessory muscle use Abdomen soft nontender Arm in sling postop changes to  the lower extremity Neurologically intact  Discharge Instructions   Discharge Instructions    Diet - low sodium heart healthy   Complete by: As directed    Increase activity slowly   Complete by: As directed    No wound care   Complete by: As directed    Weight bearing as tolerated   Complete by: As directed      Allergies as of 01/10/2020      Reactions   Penicillins Diarrhea   Has patient had a PCN reaction causing immediate rash, facial/tongue/throat swelling, SOB or lightheadedness with hypotension: Unknown Has patient had a PCN reaction causing severe rash involving mucus membranes or skin necrosis: Unknown Has patient had a PCN reaction that required hospitalization: No  Has patient had a PCN reaction occurring within the last 10 years: No  If all of the above answers are "NO", then may proceed with Cephalosporin use. Cephalosporins ok   Influenza Vaccines Other (See Comments)   Severe nausea, vomiting and body aches-md told her not to take it anymore   Clarithromycin    Unknown reaction    Cortisone Other (See Comments)   "Made her feel like ice water was running through her veins"    Povidone-iodine    Unknown reaction       Medication List    STOP taking these medications   calcium carbonate 500 MG chewable tablet Commonly known as: TUMS - dosed in mg elemental calcium   celecoxib 200 MG capsule Commonly known as: CELEBREX     HYDROcodone-acetaminophen 5-325 MG tablet Commonly known as: NORCO/VICODIN   menthol-cetylpyridinium 3 MG lozenge Commonly known as: CEPACOL   phenol 1.4 % Liqd Commonly known as: CHLORASEPTIC   polyvinyl alcohol 1.4 % ophthalmic solution Commonly known as: LIQUIFILM TEARS   rivaroxaban 10 MG Tabs tablet Commonly known as: XARELTO   verapamil 120 MG tablet Commonly known as: CALAN     TAKE these medications   ALPRAZolam 0.5 MG tablet Commonly known as: XANAX Take 1 tablet (0.5 mg total) by mouth 2 (two) times daily as  needed for anxiety.   benzonatate 100 MG capsule Commonly known as: TESSALON TAKE 1 CAPSULE (100 MG TOTAL) BY MOUTH EVERY 8 (EIGHT) HOURS AS NEEDED FOR COUGH. What changed: when to take this   Biotin 10000 MCG Tabs Take 10,000 mcg by mouth daily.   CALCIUM 1200+D3 PO Take 1 tablet by mouth at bedtime.   clorazepate 3.75 MG tablet Commonly known as: TRANXENE TAKE 1 TABLET BY MOUTH DAILY.MAY TAKE UP TO 2 DAILY IF NEEDED What changed:   how much to take  how to take this  when to take this  additional instructions   cyclobenzaprine 10 MG tablet Commonly known as: FLEXERIL Take 10 mg by mouth at bedtime.   dextromethorphan-guaiFENesin 30-600 MG 12hr tablet Commonly known as: MUCINEX DM Take 2 tablets by mouth 2 (two) times daily.   docusate sodium 100 MG capsule Commonly known as: COLACE Take 1 capsule (100 mg total) by mouth 2 (two) times daily. What changed: when to take this   enoxaparin 40 MG/0.4ML injection Commonly known as: LOVENOX Inject 0.4 mLs (40 mg total) into the skin daily.   ferrous sulfate 325 (65 FE) MG tablet Take 1 tablet (325 mg total) by mouth daily with breakfast.   levalbuterol 0.63 MG/3ML nebulizer solution Commonly known as: XOPENEX Take 3 mLs (0.63 mg total) by nebulization 2 (two) times daily.   levothyroxine 88 MCG tablet Commonly known as: SYNTHROID Take 88 mcg by mouth daily before breakfast.   metoprolol tartrate 25 MG tablet Commonly known as: LOPRESSOR Take 0.5 tablets (12.5 mg total) by mouth 2 (two) times daily.   multivitamin tablet Take 1 tablet by mouth daily.   oxyCODONE-acetaminophen 5-325 MG tablet Commonly known as: Percocet Take 1-2 tablets by mouth every 8 (eight) hours as needed for severe pain.   pioglitazone 15 MG tablet Commonly known as: ACTOS Take 15 mg by mouth daily.   pravastatin 40 MG tablet Commonly known as: PRAVACHOL TAKE 1 TABLET BY MOUTH EVERY DAY IN THE EVENING What changed:   how much to  take  how to take this  when to take this   predniSONE 20 MG tablet Commonly known as: DELTASONE Take 1 tablet (20 mg total) by mouth daily before breakfast.   prochlorperazine 10 MG tablet Commonly known as: COMPAZINE Take 1 tablet (10 mg total) by mouth every 6 (six) hours as needed for nausea or vomiting.   Trintellix 10 MG Tabs tablet Generic drug: vortioxetine HBr Take 1 tablet (10 mg total) by mouth daily.   umeclidinium bromide 62.5 MCG/INH Aepb Commonly known as: INCRUSE ELLIPTA Inhale 1 puff into the lungs daily.   vitamin C 1000 MG tablet Take 1,000 mg by mouth at bedtime.     ASK your doctor about these medications   Ensure Max Protein Liqd Take 330 mLs (11 oz total) by mouth daily for 7 days. Ask about: Should I take this medication?   feeding supplement (GLUCERNA SHAKE) Liqd  Take 237 mLs by mouth 3 (three) times daily between meals for 7 days. Ask about: Should I take this medication?            Discharge Care Instructions  (From admission, onward)         Start     Ordered   12/31/19 0000  Weight bearing as tolerated        12/31/19 1849         Allergies  Allergen Reactions  . Penicillins Diarrhea    Has patient had a PCN reaction causing immediate rash, facial/tongue/throat swelling, SOB or lightheadedness with hypotension: Unknown Has patient had a PCN reaction causing severe rash involving mucus membranes or skin necrosis: Unknown Has patient had a PCN reaction that required hospitalization: No  Has patient had a PCN reaction occurring within the last 10 years: No  If all of the above answers are "NO", then may proceed with Cephalosporin use. Cephalosporins ok  . Influenza Vaccines Other (See Comments)    Severe nausea, vomiting and body aches-md told her not to take it anymore  . Clarithromycin     Unknown reaction   . Cortisone Other (See Comments)    "Made her feel like ice water was running through her veins"   . Povidone-Iodine      Unknown reaction     Contact information for follow-up providers    Xu, Naiping M, MD In 2 weeks.   Specialty: Orthopedic Surgery Why: For suture removal, For wound re-check Contact information: [address] [place] [address] [phone]        Little, Kevin, MD. Call in 1 day(s).   Specialty: Family Medicine Why: PLease call for a post hospital follow up appointment Contact information: [address] [place] [address] [phone]        Croitoru, Mihai, MD Follow up.   Specialty: Cardiology Why: [facility] - See apointment information below with Dr. Croitoru on Thursday January 14, 2020 at 3:40 PM (Arrive by 3:25 PM). The office will also be sending you a heart monitor to wear to track your heart rhythm. Contact information: [address] [place]  [address] [phone]            Contact information for after-discharge care    Destination    [facility] .   Service: Skilled Nursing Contact information: [address] [place] [address] [phone]                   The results of significant diagnostics from this hospitalization (including imaging, microbiology, ancillary and laboratory) are listed below for reference.    Significant Diagnostic Studies: DG Wrist 2 Views Left  Result Date: 01/02/2020 CLINICAL DATA:  Fall, wrist fracture, post reduction imaging EXAM: LEFT WRIST - 2 VIEW COMPARISON:  None. FINDINGS: Two view radiograph of the left wrist is performed within an external immobilizer which obscures fine bony detail. Impacted distal radial fracture now appears disimpacted to length and demonstrates neutral tilt of the distal radial surface and appropriate radial inclination. There is intra-articular extension of a longitudinal fracture plane likely into the lunate fossa with residual ulnar displacement of the fracture fragment. Ulnar styloid fracture again noted,  distracted medial displaced but anatomically aligned at this point. IMPRESSION: Post reduction alignment as described above. Electronically Signed   By: Ashesh  Parikh MD   On: 01/02/2020 22:58   DG Wrist 2 Views Left  Result Date: 12/31/2019 CLINICAL DATA:    Close reduction left wrist fracture EXAM: LEFT WRIST - 2 VIEW COMPARISON:  12/31/2019 at 4:36 a.m. FINDINGS: 2 fluoroscopic images are obtained during the performance of the procedure and are provided for interpretation only. There is been reduction of the impacted distal left radial fracture seen previously, now with near anatomic alignment. Displaced ulnar styloid fracture unchanged. Please refer to the operative report. FLUOROSCOPY TIME:  4 seconds, 2 images IMPRESSION: 1. Reduction of the impacted distal left radial fracture with near anatomic alignment. 2. Stable ulnar styloid fracture. Electronically Signed   By: Michael  Brown M.D.   On: 12/31/2019 19:55   DG Wrist 2 Views Left  Result Date: 12/31/2019 CLINICAL DATA:  Fall with injury to left wrist. EXAM: LEFT WRIST - 2 VIEW COMPARISON:  Prior report 07/07/1999. FINDINGS: Angulated fracture of the distal left radius. This appears to be old and was previously described on prior study of 07/07/1999. A follow-up exam in 7-10 days may prove useful to ensure stable appearance. Displaced fracture of the ulnar styloid. This appears acute. Diffuse degenerative change. Diffuse soft tissue swelling. No radiopaque foreign body. IMPRESSION: 1. Angulated fracture of the distal left radius. This appears to be old was previously described on prior study of 07/07/1999. A follow-up exam in 7-10 days may prove useful to ensure stable appearance. 2.  Displaced fracture of the ulnar styloid.  This appears acute. 3.  Diffuse degenerative change.  Diffuse soft tissue swelling. Electronically Signed   By: Thomas  Register   On: 12/31/2019 05:33   CT Head Wo Contrast  Result Date: 12/31/2019 CLINICAL DATA:  Facial  trauma.  Fell on porch. EXAM: CT HEAD WITHOUT CONTRAST CT CERVICAL SPINE WITHOUT CONTRAST TECHNIQUE: Multidetector CT imaging of the head and cervical spine was performed following the standard protocol without intravenous contrast. Multiplanar CT image reconstructions of the cervical spine were also generated. COMPARISON:  Head MRI 12/08/2019.  Neck CTA 06/11/2010. FINDINGS: CT HEAD FINDINGS Brain: There is no evidence of acute infarct, intracranial hemorrhage, mass, midline shift, or extra-axial fluid collection. There is mild cerebral atrophy with unchanged enlargement of the extra-axial CSF spaces over the frontal convexities. Hypodensities in the cerebral white matter bilaterally are nonspecific but compatible with mild chronic small vessel ischemic disease. Vascular: Calcified atherosclerosis at the skull base. 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 Skull: No fracture suspicious osseous lesion. Sinuses/Orbits: Chronic left maxillary sinusitis with essentially complete opacification of the sinus. Clear mastoid air cells. Unremarkable orbits. Other: None. CT CERVICAL SPINE FINDINGS Alignment: Mild reversal of the normal cervical lordosis. Trace anterolisthesis of C3 on C4. Skull base and vertebrae: No acute fracture or suspicious osseous lesion. Soft tissues and spinal canal: No prevertebral fluid or swelling. No visible canal hematoma. Disc levels: Moderate disc space narrowing at C5-6 with asymmetric left uncovertebral spurring resulting in mild left neural foraminal stenosis. Severe facet arthrosis on the right at C2-3 and on the left at C3-4 and C4-5. Upper chest: Biapical pleuroparenchymal lung scarring, centrilobular emphysema, and biapical nodular densities similar to a 12/08/2019 PET-CT. Other: Mild calcified atherosclerosis at the left carotid bifurcation. IMPRESSION: 1. No evidence of acute intracranial abnormality. 2. Mild chronic  small vessel ischemic disease. 3. Known 1 cm right supraclinoid ICA aneurysm. 4. No acute cervical spine fracture. 5. Emphysema (ICD10-J43.9). Electronically Signed   By: Allen  Grady M.D.   On: 12/31/2019 06:12   CT Cervical Spine Wo Contrast  Result Date: 12/31/2019 CLINICAL DATA:    Facial trauma.  Fell on [employer] EXAM: CT HEAD WITHOUT CONTRAST CT CERVICAL SPINE WITHOUT CONTRAST TECHNIQUE: Multidetector CT imaging of the head and cervical spine was performed following the standard protocol without intravenous contrast. Multiplanar CT image reconstructions of the cervical spine were also generated. COMPARISON:  Head MRI 12/08/2019.  Neck CTA 06/11/2010. FINDINGS: CT HEAD FINDINGS Brain: There is no evidence of acute infarct, intracranial hemorrhage, mass, midline shift, or extra-axial fluid collection. There is mild cerebral atrophy with unchanged enlargement of the extra-axial CSF spaces over the frontal convexities. Hypodensities in the cerebral white matter bilaterally are nonspecific but compatible with mild chronic small vessel ischemic disease. Vascular: Calcified atherosclerosis at the skull base. 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 Skull: No fracture suspicious osseous lesion. Sinuses/Orbits: Chronic left maxillary sinusitis with essentially complete opacification of the sinus. Clear mastoid air cells. Unremarkable orbits. Other: None. CT CERVICAL SPINE FINDINGS Alignment: Mild reversal of the normal cervical lordosis. Trace anterolisthesis of C3 on C4. Skull base and vertebrae: No acute fracture or suspicious osseous lesion. Soft tissues and spinal canal: No prevertebral fluid or swelling. No visible canal hematoma. Disc levels: Moderate disc space narrowing at C5-6 with asymmetric left uncovertebral spurring resulting in mild left neural foraminal stenosis. Severe facet arthrosis on the right at C2-3 and on the left at C3-4 and C4-5.  Upper chest: Biapical pleuroparenchymal lung scarring, centrilobular emphysema, and biapical nodular densities similar to a 12/08/2019 PET-CT. Other: Mild calcified atherosclerosis at the left carotid bifurcation. IMPRESSION: 1. No evidence of acute intracranial abnormality. 2. Mild chronic small vessel ischemic disease. 3. Known 1 cm right supraclinoid ICA aneurysm. 4. No acute cervical spine fracture. 5. Emphysema (ICD10-J43.9). Electronically Signed   By: Logan Bores M.D.   On: 12/31/2019 06:12   CT WRIST LEFT WO CONTRAST  Result Date: 12/31/2019 CLINICAL DATA:  Evaluate left wrist fracture EXAM: CT OF THE LEFT WRIST WITHOUT CONTRAST TECHNIQUE: Multidetector CT imaging was performed according to the standard protocol. Multiplanar CT image reconstructions were also generated. COMPARISON:  X-ray 12/31/2019 FINDINGS: Technical note: Examination is degraded by beam hardening artifact related to patient positioning of the wrist overlying the abdomen. Bones/Joint/Cartilage Acute impacted fracture of the distal radial metaphysis with intra-articular extension to the radiocarpal joint. Approximately 6 mm of impaction. Large dorsal fracture fragment of the distal radius is displaced dorsally up to 5 mm resulting in significant radiocarpal articular surface diastasis (series 4, image 21). Acute mildly displaced ulnar styloid fracture (series 5, image 22) Carpal bones appear intact without fracture or malalignment. Ligaments Suboptimally assessed by CT. Muscles and Tendons Suboptimally evaluated given the degree of beam hardening. Soft tissues Diffuse soft tissue swelling the level of the wrist. No well-defined fluid collection. IMPRESSION: 1. Acute impacted fracture of the distal radial metaphysis with intra-articular extension to the radiocarpal joint. 2. Dominant dorsal fracture fragment of the distal radius is displaced dorsally up to 5 mm with resultant radiocarpal articular surface diastasis. 3. Acute mildly  displaced ulnar styloid fracture. Electronically Signed   By: Davina Poke D.O.   On: 12/31/2019 13:23   Pelvis Portable  Result Date: 12/31/2019 CLINICAL DATA:  Left hip arthroplasty, left hip pain and immobility EXAM: PORTABLE PELVIS 1-2 VIEWS COMPARISON:  12/31/2019 FINDINGS: Supine frontal view of the lower pelvis and bilateral hips was performed, the iliac crests and sacrum are excluded by collimation. Chronic right hip arthroplasty remains in anatomic alignment. The left hip arthroplasty is  unchanged in position from intraoperative exam performed earlier. Postsurgical changes are seen within the soft tissues surrounding the left hip. There are no acute bony abnormalities. IMPRESSION: 1. Unremarkable bilateral hip arthroplasties as above. Electronically Signed   By: Randa Ngo M.D.   On: 12/31/2019 20:35   DG CHEST PORT 1 VIEW  Result Date: 01/07/2020 CLINICAL DATA:  Heart failure. EXAM: PORTABLE CHEST 1 VIEW COMPARISON:  01/02/2020, 05/13/2018.  CT chest 11/04/2019. FINDINGS: Mediastinum appears stable. Heart size appears stable. Venous congestion. Chronic interstitial changes are again noted bilaterally. Left upper lung prominent mass is again noted without interim change. No prominent pleural effusion. Biapical pleural thickening consistent scarring. No pneumothorax. No acute bony abnormality. IMPRESSION: 1.  Large left upper lung mass again noted without interim change. 2. Chronic interstitial lung disease again noted. 3. Heart size stable. No pulmonary venous congestion. Electronically Signed   By: Marcello Moores  Register   On: 01/07/2020 12:16   DG CHEST PORT 1 VIEW  Result Date: 01/02/2020 CLINICAL DATA:  Cough EXAM: PORTABLE CHEST 1 VIEW COMPARISON:  12/31/2019, 12/08/2019 FINDINGS: Single frontal view of the chest demonstrates large left upper lobe mass compatible with malignancy. No significant change since prior study. Diffuse interstitial prominence, scarring, and fibrosis again noted  throughout the lungs unchanged. Cardiac silhouette is stable. No acute bony abnormality. IMPRESSION: 1. Stable left upper lobe mass compatible with malignancy. 2. Diffuse pulmonary scarring and fibrosis, with no new airspace disease. Electronically Signed   By: Randa Ngo M.D.   On: 01/02/2020 20:50   DG Chest Portable 1 View  Result Date: 12/31/2019 CLINICAL DATA:  Fall. EXAM: PORTABLE CHEST 1 VIEW COMPARISON:  CT chest 11/04/2019, 05/14/2018. Chest x-ray 11/03/2019, 05/13/2018. PET CT 06/05/2018. FINDINGS: Mediastinum 1. Hilar structures are stable. Large mass left upper lung again noted again concerning for a neoplasm. Chronic interstitial lung disease again noted without interim change. Tiny left pleural effusion cannot be excluded. Biapical pleural thickening again noted consistent scarring. Heart size stable. Surgical clips right upper quadrant. Degenerative change thoracic spine. No acute bony abnormality identified. IMPRESSION: 1. Large mass left upper lung again noted. Although infectious etiology cannot be excluded. This finding is concerning for a neoplasm such as bronchogenic carcinoma. Similar findings noted on prior exam. Tiny left pleural effusion cannot be excluded. 2. Chronic interstitial lung disease again noted without interim change. 3. No acute bony abnormality identified. No evidence of pneumothorax. Electronically Signed   By: Marcello Moores  Register   On: 12/31/2019 05:26   DG Knee Left Port  Result Date: 12/31/2019 CLINICAL DATA:  Pain EXAM: PORTABLE LEFT KNEE - 1-2 VIEW COMPARISON:  None. FINDINGS: There is no acute fracture or dislocation. Osseous mineralization is decreased. There are tricompartmental changes of osteoarthritis, greatest in the patellofemoral compartment. No joint effusion. IMPRESSION: No acute osseous abnormality or joint effusion.  Osteoarthritis. Electronically Signed   By: Macy Mis M.D.   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 There are expected postsurgical changes. The alignment is near anatomic. There is no evidence for a periprosthetic fracture. IMPRESSION: Status post total hip arthroplasty on the left. Electronically Signed   By: Jamie Kato.D.  On: 12/31/2019 19:57   DG HIP PORT UNILAT WITH PELVIS 1V LEFT  Result Date: 12/31/2019 CLINICAL DATA:  Fall with injury to left hip. EXAM: DG HIP (WITH OR WITHOUT PELVIS) 1V PORT LEFT COMPARISON:  PET-CT 12/08/2019. FINDINGS: Diffuse osteopenia. Degenerative changes lumbar spine and left hip. Total right hip replacement. Hardware intact. Anatomic alignment. Angulated comminuted fracture of the left femoral neck noted. No evidence of dislocation. IMPRESSION: Angulated comminuted fracture of the left femoral neck. Electronically Signed   By: Thomas  Register   On: 12/31/2019 05:29   DG HIP OPERATIVE UNILAT W OR W/O PELVIS LEFT  Result Date: 12/31/2019 CLINICAL DATA:  Hip replacement EXAM: OPERATIVE LEFT HIP (WITH PELVIS IF PERFORMED) 3 VIEWS TECHNIQUE: Fluoroscopic spot image(s) were submitted for interpretation post-operatively. 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 There are expected postsurgical changes. The alignment is near anatomic. There is no evidence for a periprosthetic fracture. IMPRESSION: Status post total hip arthroplasty on the left. Electronically Signed   By: Christopher  Green M.D.   On: 12/31/2019 19:56   ECHOCARDIOGRAM LIMITED  Result Date: 01/08/2020    ECHOCARDIOGRAM LIMITED REPORT   Patient Name:   [name] Date of Exam: 01/08/2020 Medical Rec #:  [id number]       Height:       65.0 in Accession #:    [id number]     Weight:       140.9 lb Date of Birth:  01/05/1941       BSA:          1.704 m Patient Age:    78 years       BP:           111/61 mmHg Patient Gender: F              HR:           138 bpm. Exam Location:  Inpatient Procedure: Limited Echo, Cardiac Doppler and Color Doppler STAT ECHO Indications:    abnormal ecg 794.31  History:        Patient has prior history of Echocardiogram examinations, most                 recent 11/05/2019. Cancer; Risk Factors:Diabetes and                 Dyslipidemia.  Sonographer:    Lauren Pennington Referring Phys: [id number] JAI-GURMUKH SAMTANI IMPRESSIONS  1. Left ventricular ejection fraction, by estimation, is 60 to 65%. The left ventricle has normal function. The left ventricle has no regional wall motion abnormalities. Left ventricular diastolic parameters are indeterminate.  2. Right ventricular systolic function was not well visualized. The right ventricular size is not well visualized. Tricuspid regurgitation signal is inadequate for assessing PA pressure.  3. The mitral valve is normal in structure. Trivial mitral valve regurgitation. No evidence of mitral stenosis.  4. The aortic valve is normal in structure. Aortic valve regurgitation is trivial. No aortic stenosis is present. FINDINGS  Left Ventricle: Left ventricular ejection fraction, by estimation, is 60 to 65%. The left ventricle has normal function. The left ventricle has no regional wall motion abnormalities. The left ventricular internal cavity size was normal in size. There is  no left ventricular hypertrophy. Right Ventricle: The right ventricular size is not well visualized. Right vetricular wall thickness was not assessed. Right ventricular systolic function was not well visualized. Tricuspid regurgitation signal is inadequate for assessing PA pressure.   Left Atrium: Left atrial size was normal in size. Right Atrium: Right atrial size was normal in size. Pericardium:  There is no evidence of pericardial effusion. Mitral Valve: The mitral valve is normal in structure. Normal mobility of the mitral valve leaflets. Mild mitral annular calcification. Trivial mitral valve regurgitation. No evidence of mitral valve stenosis. Tricuspid Valve: The tricuspid valve is normal in structure. Tricuspid valve regurgitation is mild . No evidence of tricuspid stenosis. Aortic Valve: The aortic valve is normal in structure. Aortic valve regurgitation is trivial. No aortic stenosis is present. Pulmonic Valve: The pulmonic valve was normal in structure. Pulmonic valve regurgitation is trivial. No evidence of pulmonic stenosis. Aorta: The aortic root is normal in size and structure. Venous: The inferior vena cava was not well visualized. IAS/Shunts: No atrial level shunt detected by color flow Doppler. LEFT VENTRICLE PLAX 2D LVIDd:         3.50 cm LVIDs:         2.10 cm LV PW:         0.80 cm LV IVS:        0.70 cm LVOT diam:     1.90 cm LVOT Area:     2.84 cm  LEFT ATRIUM         Index LA diam:    2.30 cm 1.35 cm/m   AORTA Ao Root diam: 3.30 cm Ao Asc diam:  2.70 cm  SHUNTS Systemic Diam: 1.90 cm Traci Turner MD Electronically signed by Traci Turner MD Signature Date/Time: 01/08/2020/3:22:09 PM    Final     Microbiology: No results found for this or any previous visit (from the past 240 hour(s)).   Labs: Basic Metabolic Panel: Recent Labs  Lab 01/05/20 0257 01/06/20 0332 01/07/20 0306 01/08/20 0420 01/08/20 1443 01/09/20 0236  NA 133* 136 137 137  --  136  K 3.3* 4.8 4.7 4.0  --  4.0  CL 95* 97* 99 96*  --  96*  CO2 31 31 29 31  --  31  GLUCOSE 129* 190* 149* 105*  --  123*  BUN 7* 11 12 9  --  19  CREATININE 0.42* 0.51 0.46 0.45  --  0.52  CALCIUM 8.9 9.0 9.1 10.0  --  10.1  MG 1.5*  --   --   --  1.9 1.8  PHOS 2.7  --   --   --   --   --    Liver Function Tests: Recent Labs  Lab 01/05/20 0257  AST 21  ALT 9  ALKPHOS 93  BILITOT 0.5  PROT 6.2*  ALBUMIN 1.7*    No results for input(s): LIPASE, AMYLASE in the last 168 hours. No results for input(s): AMMONIA in the last 168 hours. CBC: Recent Labs  Lab 01/05/20 0257 01/06/20 0332 01/07/20 0306 01/08/20 0420 01/09/20 0236  WBC 7.3 8.1 11.8* 11.7* 12.5*  NEUTROABS 4.9  --  8.8* 8.4* 9.2*  HGB 7.8* 7.6* 8.1* 8.5* 9.2*  HCT 25.5* 24.7* 25.8* 28.1* 30.3*  MCV 88.9 88.8 88.4 90.9 91.0  PLT 259 289 323 384 408*   Cardiac Enzymes: No results for input(s): CKTOTAL, CKMB, CKMBINDEX, TROPONINI in the last 168 hours. BNP: BNP (last 3 results) No results for input(s): BNP in the last 8760 hours.  ProBNP (last 3 results) No results for input(s): PROBNP in the last 8760 hours.  CBG: Recent Labs  Lab 01/09/20 0743 01/09/20 1133 01/09/20 1702 01/09/20 2103 01/10/20 0817  GLUCAP 161* 153* 155*   228* 167*       Signed:  Nita Sells MD   [facility] 01/10/2020, 9:12 AM

## 2020-01-10 NOTE — TOC Transition Note (Signed)
Transition of Care [facility]) - CM/SW Discharge Note   Patient Details  Name: [name] MRN: [id number] Date of Birth: 14-Jul-1940  [facility]) CM/SW Contact:  Bary Castilla, LCSW Phone Number: [phone] 01/10/2020, 10:03 AM   Clinical Narrative:    Patient will DC to:?[facility]  Anticipated DC date:?01/10/20 Family notified:?Cathy Transport by: Corey Harold  Per MD patient ready for DC to [facility]. RN, patient, patient's family, and facility notified of DC. Discharge Summary sent to facility. RN given number for [phone] room 106P. DC packet on chart. Ambulance transport requested for patient.   CSW signing off.   Final next level of care: [place] Barriers to Discharge: No Barriers Identified   Patient Goals and CMS Choice Patient states their goals for this hospitalization and ongoing recovery are:: Plans to go to short term rehab possibly close to [place]. CMS Medicare.gov Compare Post Acute Care list provided to:: Patient Choice offered to / list presented to : Patient  Discharge Placement              Patient [facility] bed at: [facility] Patient to be transferred to facility by: [place] Name of family member notified: [facility]  Discharge Plan and Services   Discharge Planning Services: CM Consult Post Acute Care Choice: [place]          DME Arranged:  (Home O2 at 2L/min, WC, RW, 3:1)           [place] Agency:  (Active with [facility] in [place])        Social Determinants of Health ([place]) Interventions     Readmission Risk Interventions Readmission Risk Prevention Plan 01/01/2020  Transportation Screening Complete  PCP or Specialist Appt within 3-5 Days Complete  HRI or Home Care Consult Complete  Social Work Consult for [place] Planning/Counseling Complete  Palliative Care Screening Complete  Medication Review Press photographer) Complete  Some recent data might be hidden

## 2020-01-11 ENCOUNTER — Ambulatory Visit: Payer: Medicare Other | Admitting: Physician Assistant

## 2020-01-11 ENCOUNTER — Ambulatory Visit: Payer: Medicare Other

## 2020-01-11 ENCOUNTER — Other Ambulatory Visit: Payer: Medicare Other

## 2020-01-11 ENCOUNTER — Telehealth: Payer: Self-pay | Admitting: *Deleted

## 2020-01-11 NOTE — Telephone Encounter (Signed)
I called patient but was unable to reach her. I called her son.  He states that she is being seen at [facility] for cancer treatment due to transportation and family care.  I will cancel up coming appts and notify Dr. Julien Nordmann.

## 2020-01-12 ENCOUNTER — Other Ambulatory Visit: Payer: Self-pay | Admitting: Physician Assistant

## 2020-01-12 DIAGNOSIS — I4892 Unspecified atrial flutter: Secondary | ICD-10-CM

## 2020-01-12 NOTE — Progress Notes (Signed)
[name]

## 2020-01-12 NOTE — Addendum Note (Signed)
Addended by: Charlie Pitter on: 01/12/2020 07:40 AM   Modules accepted: Orders

## 2020-01-13 ENCOUNTER — Telehealth: Payer: Self-pay | Admitting: Cardiovascular Disease

## 2020-01-13 ENCOUNTER — Inpatient Hospital Stay: Payer: Medicare Other

## 2020-01-13 NOTE — Telephone Encounter (Signed)
   Pt's daughter calling, she said pt is in rehab and can only do 10 steps and can't go out. She is wondering if they can change pt's appt to virtual for tomorrow. She said they can do video call visit if permitted

## 2020-01-13 NOTE — Telephone Encounter (Signed)
The daughter has been made aware that this is fine. Appointment changed.

## 2020-01-14 ENCOUNTER — Encounter: Payer: Self-pay | Admitting: Cardiovascular Disease

## 2020-01-14 ENCOUNTER — Encounter: Payer: Self-pay | Admitting: *Deleted

## 2020-01-14 ENCOUNTER — Telehealth (INDEPENDENT_AMBULATORY_CARE_PROVIDER_SITE_OTHER): Payer: Medicare Other | Admitting: Cardiovascular Disease

## 2020-01-14 VITALS — BP 120/55 | HR 73 | Ht 66.0 in | Wt 138.0 lb

## 2020-01-14 DIAGNOSIS — I671 Cerebral aneurysm, nonruptured: Secondary | ICD-10-CM

## 2020-01-14 DIAGNOSIS — J849 Interstitial pulmonary disease, unspecified: Secondary | ICD-10-CM

## 2020-01-14 DIAGNOSIS — E119 Type 2 diabetes mellitus without complications: Secondary | ICD-10-CM

## 2020-01-14 DIAGNOSIS — E7800 Pure hypercholesterolemia, unspecified: Secondary | ICD-10-CM | POA: Diagnosis not present

## 2020-01-14 DIAGNOSIS — I10 Essential (primary) hypertension: Secondary | ICD-10-CM

## 2020-01-14 DIAGNOSIS — I2510 Atherosclerotic heart disease of native coronary artery without angina pectoris: Secondary | ICD-10-CM | POA: Diagnosis not present

## 2020-01-14 DIAGNOSIS — C3412 Malignant neoplasm of upper lobe, left bronchus or lung: Secondary | ICD-10-CM

## 2020-01-14 DIAGNOSIS — I480 Paroxysmal atrial fibrillation: Secondary | ICD-10-CM

## 2020-01-14 NOTE — Patient Instructions (Addendum)
Medication Instructions:  No changes *If you need a refill on your cardiac medications before your next appointment, please call your pharmacy*   Lab Work: None ordered If you have labs (blood work) drawn today and your tests are completely normal, you will receive your results only by: [name] MyChart Message (if you have MyChart) OR . A paper copy in the mail If you have any lab test that is abnormal or we need to change your treatment, we will call you to review the results.   Testing/Procedures: Cardiac monitor to be started 01/25/20 (mail to son's address)   Follow-Up: At [facility], you and your health needs are our priority.  As part of our continuing mission to provide you with exceptional heart care, we have created designated Provider Care Teams.  These Care Teams include your primary Cardiologist (physician) and Advanced Practice Providers (APPs -  Physician Assistants and Nurse Practitioners) who all work together to provide you with the care you need, when you need it.  We recommend signing up for the patient portal called "MyChart".  Sign up information is provided on this After Visit Summary.  MyChart is used to connect with patients for Virtual Visits (Telemedicine).  Patients are able to view lab/test results, encounter notes, upcoming appointments, etc.  Non-urgent messages can be sent to your provider as well.   To learn more about what you can do with MyChart, go to [email or web address].    Your next appointment:   Follow up with Dr. Sallyanne Kuster, virtual visit.

## 2020-01-14 NOTE — Progress Notes (Signed)
Virtual Visit via Video Note   This visit type was conducted due to national recommendations for restrictions regarding the COVID-19 Pandemic (e.g. social distancing) in an effort to limit this patient's exposure and mitigate transmission in our community.  Due to her co-morbid illnesses, this patient is at least at moderate risk for complications without adequate follow up.  This format is felt to be most appropriate for this patient at this time.  All issues noted in this document were discussed and addressed.  A limited physical exam was performed with this format.  Please refer to the patient's chart for her consent to telehealth for [facility].       Date:  01/17/2020   ID:  [name], DOB 1940/07/29, MRN [id number] The patient was identified using 2 identifiers.  Patient Location: [facility] Provider Location: Office/Clinic  PCP:  [name], MD  Cardiologist:  Sanda Klein, MD  Electrophysiologist:  None   Evaluation Performed:  Follow-Up Visit  Chief Complaint:  CAD  History of Present Illness:    [name] is a 79 y.o. 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  She has multiple coronary risk factors that include type 2 diabetes mellitus, hypertension and dyslipidemia.  She underwent a cardiopulmonary stress test in February 2020 without evidence of high risk findings for CAD, but raising suspicion for possible chronotropic incompetence.  MVO2 75th percentile.  She is receiving cancer treatment for lung cancer at [facility] (Dr. Mariel Kansky) with plans to initiate radiation therapy.  She was hospitalized at [facility] for a week at the end of July, following a left femoral neck fracture (surgically treated with THR) as well as closed fracture of the distal left radius, after a fall.  She is  currently in rehab at [facility] since August 1.  She was previously admitted for hip fracture in late May.  During her recent hospitalization she had a brief episode of atrial flutter or atrial tachycardia treated with beta-blockers.  She was in sinus rhythm at the time of hospital discharge.  She was not started on anticoagulation.  Echocardiogram during that admission showed normal LVEF 60 to 65%, normal atrial sizes and no other major structural abnormalities.  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  Diabetes mellitus was well controlled with a hemoglobin A1c of 5.8%.  She appears to be euthyroid based on TSH.  She was moderately anemic with a hemoglobin of 9.2 at discharge, normocytic, normochromic, but with a low iron saturation; received Feraheme infusion.  Her biggest complaint remains shortness of breath and her activity level remains poor.  She does not have angina at rest or with activity and has not been troubled by palpitations, dizziness, syncope, leg edema, claudication or focal neurological complaints.    Past Medical History:  Diagnosis Date  . Bronchiectasis    PFTs August 23, 2010 VC 82%   dlco 62 > 129 CORRECTED  . Chronic diarrhea   . Chronic rhinitis    sinus CT ordered August 23, 2010  . Diabetes mellitus ([place])   . Hyperplastic colon polyp   . IBS (irritable bowel syndrome)   . ILD (interstitial lung disease) ([place])   . Stenosis of rectum and anus   . Thyroid disease    Past Surgical History:  Procedure Laterality Date  .  gallbaldder    . BRONCHIAL BIOPSY  11/13/2019   Procedure: BRONCHIAL BIOPSIES;  Surgeon: Collene Gobble, MD;  Location: [facility];  Service: Cardiopulmonary;;  . BRONCHIAL BRUSHINGS  11/13/2019   Procedure: BRONCHIAL BRUSHINGS;  Surgeon: Collene Gobble, MD;  Location: [facility];  Service: Cardiopulmonary;;  . CHOLECYSTECTOMY    . DILATION AND CURETTAGE OF UTERUS    . HAND SURGERY  1991     left hand; Baker's Cyst removed  . migraine    . OPEN REDUCTION INTERNAL FIXATION (ORIF) DISTAL RADIAL FRACTURE Left 12/31/2019   Procedure: CLOSED REDUCTION LEFT DISTAL RADIUS FRACTURE;  Surgeon: Leandrew Koyanagi, MD;  Location: [facility];  Service: Orthopedics;  Laterality: Left;  . tear duct surgery    . TOTAL HIP ARTHROPLASTY Right 11/04/2019   Procedure: TOTAL HIP ARTHROPLASTY ANTERIOR APPROACH;  Surgeon: Paralee Cancel, MD;  Location: [facility];  Service: Orthopedics;  Laterality: Right;  . TOTAL HIP ARTHROPLASTY Left 12/31/2019   Procedure: LEFT TOTAL HIP ARTHROPLASTY ANTERIOR APPROACH;  Surgeon: Leandrew Koyanagi, MD;  Location: [facility];  Service: Orthopedics;  Laterality: Left;  [name] VIDEO BRONCHOSCOPY N/A 11/13/2019   Procedure: VIDEO BRONCHOSCOPY;  Surgeon: Collene Gobble, MD;  Location: [facility];  Service: Cardiopulmonary;  Laterality: N/A;     Current Meds  Medication Sig  . ALPRAZolam (XANAX) 0.5 MG tablet Take 1 tablet (0.5 mg total) by mouth 2 (two) times daily as needed for anxiety.  . Ascorbic Acid (VITAMIN C) 1000 MG tablet Take 1,000 mg by mouth at bedtime.   . benzonatate (TESSALON) 100 MG capsule TAKE 1 CAPSULE (100 MG TOTAL) BY MOUTH EVERY 8 (EIGHT) HOURS AS NEEDED FOR COUGH. (Patient taking differently: Take 100 mg by mouth in the morning. )  . Biotin 10000 MCG TABS Take 10,000 mcg by mouth daily.   . Calcium-Magnesium-Vitamin D (CALCIUM 1200+D3 PO) Take 1 tablet by mouth at bedtime.  . clorazepate (TRANXENE) 3.75 MG tablet TAKE 1 TABLET BY MOUTH DAILY.MAY TAKE UP TO 2 DAILY IF NEEDED (Patient taking differently: Take 3.75 mg by mouth daily. MAY TAKE UP TO 2 DAILY IF NEEDED)  . cyclobenzaprine (FLEXERIL) 10 MG tablet Take 10 mg by mouth at bedtime.   [name] dextromethorphan-guaiFENesin (MUCINEX DM) 30-600 MG 12hr tablet Take 2 tablets by mouth 2 (two) times daily.  [name] docusate sodium (COLACE) 100 MG capsule Take 1 capsule (100 mg total) by mouth 2 (two) times daily. (Patient taking  differently: Take 100 mg by mouth daily. )  . enoxaparin (LOVENOX) 40 MG/0.4ML injection Inject 0.4 mLs (40 mg total) into the skin daily.  . ferrous sulfate 325 (65 FE) MG tablet Take 1 tablet (325 mg total) by mouth daily with breakfast.  . levalbuterol (XOPENEX) 0.63 MG/3ML nebulizer solution Take 3 mLs (0.63 mg total) by nebulization 2 (two) times daily.  [name] levothyroxine (SYNTHROID, LEVOTHROID) 88 MCG tablet Take 88 mcg by mouth daily before breakfast.   . metoprolol tartrate (LOPRESSOR) 25 MG tablet Take 0.5 tablets (12.5 mg total) by mouth 2 (two) times daily.  . Multiple Vitamin (MULTIVITAMIN) tablet Take 1 tablet by mouth daily.    [name] oxyCODONE-acetaminophen (PERCOCET) 5-325 MG tablet Take 1-2 tablets by mouth every 8 (eight) hours as needed for severe pain.  . pioglitazone (ACTOS) 15 MG tablet Take 15 mg by mouth daily.  . pravastatin (PRAVACHOL) 40 MG tablet TAKE 1 TABLET BY MOUTH EVERY DAY IN THE EVENING (Patient taking differently: Take 40 mg by mouth daily. )  .  predniSONE (DELTASONE) 20 MG tablet Take 1 tablet (20 mg total) by mouth daily before breakfast.  . prochlorperazine (COMPAZINE) 10 MG tablet Take 1 tablet (10 mg total) by mouth every 6 (six) hours as needed for nausea or vomiting.  . TRINTELLIX 10 MG TABS tablet Take 1 tablet (10 mg total) by mouth daily.  [name] umeclidinium bromide (INCRUSE ELLIPTA) 62.5 MCG/INH AEPB Inhale 1 puff into the lungs daily.     Allergies:   Penicillins, Influenza vaccines, Clarithromycin, Cortisone, and Povidone-iodine   Social History   Tobacco Use  . Smoking status: Former Smoker    Packs/day: 3.00    Years: 15.00    Pack years: 45.00    Types: Cigarettes    Quit date: 06/11/1976    Years since quitting: 43.6  . Smokeless tobacco: Never Used  Substance Use Topics  . Alcohol use: No  . Drug use: No     Family Hx: The patient's family history includes Colon polyps in her sister; Diabetes in her sister; Heart disease in her mother and  sister. There is no history of Colon cancer.  ROS:   Please see the history of present illness.    All other systems reviewed and are negative.   Prior CV studies:   The following studies were reviewed today:  Echo 01/08/2020  1. Left ventricular ejection fraction, by estimation, is 60 to 65%. The  left ventricle has normal function. The left ventricle has no regional  wall motion abnormalities. Left ventricular diastolic parameters are  indeterminate.  2. Right ventricular systolic function was not well visualized. The right  ventricular size is not well visualized. Tricuspid regurgitation signal is  inadequate for assessing PA pressure.  3. The mitral valve is normal in structure. Trivial mitral valve  regurgitation. No evidence of mitral stenosis.  4. The aortic valve is normal in structure. Aortic valve regurgitation is  trivial. No aortic stenosis is present.   Labs/Other Tests and Data Reviewed:    EKG:  An ECG dated 01/08/2020 was personally reviewed today and demonstrated:  Course atrial fibrillation with rapid ventricular response  Recent Labs: 01/05/2020: ALT 9 01/08/2020: TSH 1.524 01/09/2020: BUN 19; Creatinine, Ser 0.52; Hemoglobin 9.2; Magnesium 1.8; Platelets 408; Potassium 4.0; Sodium 136   Recent Lipid Panel No results found for: CHOL, TRIG, HDL, CHOLHDL, LDLCALC, LDLDIRECT  Wt Readings from Last 3 Encounters:  01/14/20 138 lb (62.6 kg)  01/10/20 137 lb 2 oz (62.2 kg)  12/24/19 137 lb 12.8 oz (62.5 kg)     Objective:    Vital Signs:  BP (!) 120/55   Pulse 73   Ht 5\' 6"  (1.676 m)   Wt 138 lb (62.6 kg)   LMP  (LMP Unknown)   BMI 22.27 kg/m    VITAL SIGNS:  reviewed GEN:  no acute distress EYES:  sclerae anicteric, EOMI - Extraocular Movements Intact RESPIRATORY:  normal respiratory effort, symmetric expansion CARDIOVASCULAR:  no peripheral edema SKIN:  no rash, lesions or ulcers. MUSCULOSKELETAL:  no obvious deformities. NEURO:  alert and  oriented x 3, no obvious focal deficit PSYCH:  normal affect  ASSESSMENT & PLAN:     1. Paroxysmal atrial fibrillation (HCC)   2. Intracranial aneurysm   3. Coronary artery calcification seen on CAT scan   4. Hypercholesterolemia   5. Diabetes mellitus without complication ([place])   6. Essential hypertension   7. Malignant neoplasm of upper lobe of left lung ([place])   8. ILD (interstitial lung disease) ([place])  1. Afib: On my evaluation the arrhythmia was fairly clearly atrial fibrillation, albeit with a very coarse fibrillation waves.  It occurred during hyperadrenergic state, postoperatively.  She is not a good candidate for anticoagulation after 2 very serious falls in the last.  In addition, it is possible that the atrial fibrillation will not occur frequently, since she does not have significant structural heart disease in both atria are normal in size.  In my opinion the risk of anticoagulation exceeds the potential benefit.  Continue metoprolol, can administer additional doses for episodes of tachycardia.  Beta-blockers are not likely to interfere with her breathing problems, she has primarily restrictive lung disease.  We will plan an event monitor once she is released from her rehab facility. 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  The patient tells me that they were quoted a risk of rupture roughly 1%. I do not think it should be treated if that is correct.  It is one more reason to avoid anticoagulation. 3. CAD: She does not have angina pectoris and had a low risk cardiopulmonary stress test in 2020.  The focus is on risk factor modification.  Her diabetes and hypertension is well controlled.  She is taking a statin. 4. HLP: Target LDL less than 70.  This will need to be reevaluated when she is at steady state healthwise. 5. DM: Excellent hemoglobin A1c.  No signs or symptoms of heart failure despite treatment with pioglitazone. 6. HTN: Blood  pressure in target range. 7. Lung cancer: Plans to start radiation therapy delayed due to her recent fall and hip fracture 8. Interstitial lung disease: This is advanced, limits treatments for other disorders (such as resection of the left upper lobe lung cancer or potentially bypass surgery).  It is most likely to affect her prognosis in the long run.  COVID-19 Education: The signs and symptoms of COVID-19 were discussed with the patient and how to seek care for testing (follow up with PCP or arrange E-visit).  The importance of social distancing was discussed today.  Time:   Today, I have spent 26 minutes with the patient with telehealth technology discussing the above problems.     Medication Adjustments/Labs and Tests Ordered: Current medicines are reviewed at length with the patient today.  Concerns regarding medicines are outlined above.   Tests Ordered: No orders of the defined types were placed in this encounter.   Medication Changes: No orders of the defined types were placed in this encounter.   Patient Instructions  Medication Instructions:  No changes *If you need a refill on your cardiac medications before your next appointment, please call your pharmacy*   Lab Work: None ordered If you have labs (blood work) drawn today and your tests are completely normal, you will receive your results only by: [name] MyChart Message (if you have MyChart) OR . A paper copy in the mail If you have any lab test that is abnormal or we need to change your treatment, we will call you to review the results.   Testing/Procedures: Cardiac monitor to be started 01/25/20 (mail to son's address)   Follow-Up: At [facility], you and your health needs are our priority.  As part of our continuing mission to provide you with exceptional heart care, we have created designated Provider Care Teams.  These Care Teams include your primary Cardiologist (physician) and Advanced Practice Providers (APPs  -  Physician Assistants and Nurse Practitioners) who all work together to provide you  with the care you need, when you need it.  We recommend signing up for the patient portal called "MyChart".  Sign up information is provided on this After Visit Summary.  MyChart is used to connect with patients for Virtual Visits (Telemedicine).  Patients are able to view lab/test results, encounter notes, upcoming appointments, etc.  Non-urgent messages can be sent to your provider as well.   To learn more about what you can do with MyChart, go to [email or web address].    Your next appointment:   Follow up with Dr. Sallyanne Kuster, virtual visit.      Signed, Sanda Klein, MD  01/17/2020 10:15 AM    [place]

## 2020-01-14 NOTE — Progress Notes (Signed)
Patient ID: [name], female   DOB: 1941/05/03, 79 y.o.   MRN: [id number]  [employer] contacted with update information since 01/12/2020 enrollment. Patient currently in rehab.  Please change monitor start date to 01/25/2020.  Change shipping address.  Send monitor to [name] c/o [name],  [address], [place], [place] .  Emergency [name] [phone].  Change cardiologist to Dr. Sallyanne Kuster.

## 2020-01-15 ENCOUNTER — Telehealth: Payer: Self-pay | Admitting: Cardiovascular Disease

## 2020-01-15 NOTE — Telephone Encounter (Signed)
Spoke with patient regarding virtual appointment scheduled with Dr. Sallyanne Kuster on Friday 03/11/20 at 4:00pm---patient requests I call her daughter [name] to confirm she has no other appointments.

## 2020-01-18 ENCOUNTER — Other Ambulatory Visit: Payer: Medicare Other

## 2020-01-18 ENCOUNTER — Ambulatory Visit: Payer: Medicare Other

## 2020-01-22 ENCOUNTER — Telehealth: Payer: Self-pay | Admitting: Cardiovascular Disease

## 2020-01-22 NOTE — Telephone Encounter (Signed)
New message:    Patient son calling concerning his mother medication. Patient just getting out of rehab, he has some questions. Please call patient son back.

## 2020-01-22 NOTE — Telephone Encounter (Signed)
Spoke with pt son, questions regarding metoprolol dosage answered.

## 2020-01-25 ENCOUNTER — Ambulatory Visit: Payer: Medicare Other | Admitting: Internal Medicine

## 2020-01-25 ENCOUNTER — Ambulatory Visit: Payer: Medicare Other

## 2020-01-25 ENCOUNTER — Other Ambulatory Visit: Payer: Medicare Other

## 2020-01-26 ENCOUNTER — Telehealth (HOSPITAL_COMMUNITY): Payer: Self-pay | Admitting: Radiology

## 2020-01-26 NOTE — Telephone Encounter (Signed)
Called pt's son [name]) to follow up concerning brain aneurysm tx with Dr. Debbrah Alar. Left VM for him to call me back. JM

## 2020-01-27 ENCOUNTER — Ambulatory Visit (INDEPENDENT_AMBULATORY_CARE_PROVIDER_SITE_OTHER): Payer: Medicare Other | Admitting: Orthopedic Surgery

## 2020-01-27 ENCOUNTER — Ambulatory Visit: Payer: Self-pay

## 2020-01-27 ENCOUNTER — Inpatient Hospital Stay: Payer: Medicare Other | Admitting: Physician Assistant

## 2020-01-27 ENCOUNTER — Telehealth: Payer: Self-pay | Admitting: Cardiovascular Disease

## 2020-01-27 DIAGNOSIS — S72002A Fracture of unspecified part of neck of left femur, initial encounter for closed fracture: Secondary | ICD-10-CM

## 2020-01-27 DIAGNOSIS — S52502A Unspecified fracture of the lower end of left radius, initial encounter for closed fracture: Secondary | ICD-10-CM

## 2020-01-27 NOTE — Telephone Encounter (Signed)
Would only recommend short acting metoprolol - PRN for HR>130, if SBP>90 - hold for P<50 or SBP <90.  Dr. Lemmie Evens

## 2020-01-27 NOTE — Telephone Encounter (Signed)
Returned call to patient's son. He has concerned about his mom's heart rate since last Friday. Son pulled his mom out of rehab early d/t COVID. She has PAF. He is concerned that patient's HR can fluctuate in a short period of time, with no activity. Her HR jumps from 70s-80s up to 114. Currently, she is at the doctor and her HR ranges from 70-104 with no movement. Max HR he has noticed is 120. He reports close to when it is time for her next metoprolol tartrate dose to be given, her HR will be sustained in 100s-120s. About 1 hour after meds, HR is back down to 70-80. Explained that with PAF, HR can fluctuate. She has been ordered a monitor to evaluate - the monitor is in [place] at [employer], so she has not started wearing it.   He is anxious about how and when to give her the medications, stating this is all new to him.   He does not check BP - using pulse ox to monitor HR.   He also reports last night her O2 was in the 60s. EMS was called and got her oxygen back to 100% with nonrebreather. She is normally on 2L O2 - but not continuous.   Patient reports SOB - but she has lung disease, lung cancer. He states patient is not using spirometer, not doing PT like she should be doing.   Dr. Lurline Del last note said OK to use PRN beta-blocker for tachycardia. Explained that if she has sustained tachycardia (HR greater than 100 for 30 minutes or more) would be OK to give.   Son is asking for parameters for PRN metoprolol tartrate or if patient should maybe be switched to long-acting toprol xl?  Routed to Dr. Debara Pickett (for Dr. Sallyanne Kuster)

## 2020-01-27 NOTE — Telephone Encounter (Signed)
    Pt c/o medication issue:  1. Name of Medication: metoprolol tartrate (LOPRESSOR) 25 MG tablet  2. How are you currently taking this medication (dosage and times per day)? As directed   3. Are you having a reaction (difficulty breathing--STAT)?   4. What is your medication issue? Son states the patient's HR can fluctuate in a short time frame with no change in activity. The Son also states that the HR can start to fluctuate when she is due for her next dose of medication. The Son wants to know if the patient should be taking an extended release version of the medication or not

## 2020-01-28 ENCOUNTER — Telehealth: Payer: Self-pay | Admitting: Cardiovascular Disease

## 2020-01-28 NOTE — Telephone Encounter (Signed)
Mitch called [employer] to have monitor shipped overnight to [facility] address sent in previous message. Karn Pickler will call Adam Langelier as soon as he has a confirmation from [employer] that monitor can be shipped overnight.

## 2020-01-28 NOTE — Telephone Encounter (Signed)
[name] is calling stating the cardiac event monitor was never delivered to the mailing address listed on file. He states he had his boyfriend check around the entire house and nothing has been received. [name] states that he will be coming back to [facility] either tomorrow or Saturday. He is wanting to know if one can be shipped overnight due to this. Please advise.

## 2020-01-28 NOTE — Telephone Encounter (Signed)
Son [name] notified of MD advice. Sent MyChart message also. He plans to obtain monitor from [facility] this weekend so patient can start it.

## 2020-01-29 ENCOUNTER — Telehealth: Payer: Self-pay | Admitting: Cardiovascular Disease

## 2020-01-29 ENCOUNTER — Telehealth: Payer: Self-pay | Admitting: Orthopaedic Surgery

## 2020-01-29 NOTE — Telephone Encounter (Signed)
Received call from [place] with [name] needing orders  For HHPT to begin 02/02/2020. Patient will be living with her son [name] in [place] asked if the orders can be faxed to her attenttion    The fax# is [phone]   PH# is [phone]

## 2020-01-29 NOTE — Telephone Encounter (Signed)
Pt's son aware to give Metoprolol 12.5 mg bid and may hold if Hr less than 50 or Systolic less than 03./OB

## 2020-01-29 NOTE — Telephone Encounter (Signed)
New Message  [name] called after hours line at 2:44am  Says the pts heart rate was elevated at 150. Pt took meds and it got down to 82.  BP is now 56/60  [name] says he will call back if it goes up again.   Please advise

## 2020-01-29 NOTE — Telephone Encounter (Signed)
    Pt c/o medication issue:  1. Name of Medication:   metoprolol tartrate (LOPRESSOR) 25 MG tablet    2. How are you currently taking this medication (dosage and times per day)?   3. Are you having a reaction (difficulty breathing--STAT)?   4. What is your medication issue? Pt's son, [name], would like to know since he gave additional 12.5 mg of metoprolol to the pt last night due to palpitations episode, he would ike to know if the pt will take her regular 12.5 mg in the morning and 12.5mg  at night.

## 2020-01-30 ENCOUNTER — Ambulatory Visit (INDEPENDENT_AMBULATORY_CARE_PROVIDER_SITE_OTHER): Payer: Medicare Other

## 2020-01-30 ENCOUNTER — Telehealth: Payer: Self-pay | Admitting: Cardiology

## 2020-01-30 ENCOUNTER — Encounter: Payer: Self-pay | Admitting: Internal Medicine

## 2020-01-30 DIAGNOSIS — I480 Paroxysmal atrial fibrillation: Secondary | ICD-10-CM

## 2020-01-30 DIAGNOSIS — I4892 Unspecified atrial flutter: Secondary | ICD-10-CM

## 2020-01-30 MED ORDER — METOPROLOL TARTRATE 25 MG PO TABS: 25.0000 mg | ORAL_TABLET | Freq: Two times a day (BID) | ORAL | 11 refills | Status: AC

## 2020-01-30 NOTE — Telephone Encounter (Signed)
I was notified by [place] that the patient was in Afib rhythm today at 1.40pm HR ~ 100 bpm  I called the patient #: [phone]. Spoke to the patient and her son, [name].  [name] reports that she has been in afib since last week (max HR 155), BP been running 124/70 mm Hg. She is weak overall, fatigued and currently at home with her son in [place].  She has chemotherapy coming up on Monday. She has been taking metoprolol 12.5mg  BID  Recommendations - increase metoprolol to 25mg  BID (will send a prescription to [facility] in [place])  - I have instructed him to hold or cut down on metoprolol if HR or BP is too low. - D/w son regarding anticoagulation. Patient currently on DVT prophylaxis- lovenox 40mg  per ortho. She is high risk of falls, and high risk of bleeding- thus not a candidate for anticoagulation. Should she improve in the future- this could be readdressed and also see if she could be a candidate for Watchman procedure. - son, [name] aware of above recs - F/w as scheduled with cardiology and call us if they have questions or concerns.

## 2020-01-31 ENCOUNTER — Encounter: Payer: Self-pay | Admitting: Orthopedic Surgery

## 2020-01-31 NOTE — Progress Notes (Signed)
Post-Op Visit Note   Patient: [name]           Date of Birth: 06/19/1940           MRN: [id number] Visit Date: 01/27/2020 PCP: [name], MD   Assessment & Plan:  Chief Complaint:  Chief Complaint  Patient presents with  . Post-op Follow-up   Visit Diagnoses:  1. Closed fracture of neck of left femur, initial encounter ([place])   2. Closed fracture of distal end of left radius, unspecified fracture morphology, initial encounter     Plan: Patient is a 79 year old female presents s/p left total hip arthroplasty on 12/31/19 by Dr. [name]. Patient is also being treated for left distal radius fracture. Patient notes very little pain in regards to the left hip. She has mild groin pain but overall she is doing well. She is ambulating with a walker and was doing very well until her skilled nursing facility had a Covid outbreak and she had to go home. She "feels weaker" in the last about 1 week. She denies any fevers, chills, malaise, drainage. She does have a history of lung cancer for which she is undergoing treatment and start chemotherapy on Monday [facility]. She always has her son with her in the house. Incision is healing well without any evidence of infection or dehiscence. Radiographs taken today show a left hip prosthesis in good position and alignment. Left distal radius fracture appears to be healing well on radiographs as well. She has little complaint of left wrist pain. Plan to start patient on home health physical therapy 2 times per week with her left hip. She will also be put in a short arm cast for 3 weeks for the left distal radius fracture. Patient and son agree with plan.  Follow-Up Instructions: No follow-ups on file.   Orders:  Orders Placed This Encounter  Procedures  . XR HIP UNILAT W OR W/O PELVIS 2-3 VIEWS LEFT  . XR Wrist Complete Left   No orders of the defined types were placed in this encounter.   Imaging: No results found.  PMFS History: Patient Active  Problem List   Diagnosis Date Noted  . Fracture of femoral neck, left, closed ([place]) 12/31/2019  . Protein-calorie malnutrition, severe 12/31/2019  . Closed fracture of left distal radius 12/31/2019  . Closed fracture of left wrist   . Goals of care, counseling/discussion 12/24/2019  . Stage III squamous cell carcinoma of left lung ([place]) 12/01/2019  . Encounter for antineoplastic chemotherapy 12/01/2019  . Fall at home   . Unwitnessed fall 11/04/2019  . Mediastinal adenopathy 11/04/2019  . Hypothyroidism 11/04/2019  . Fracture of femoral neck, right ([place]) 11/03/2019  . Essential hypertension 07/20/2018  . Hypercholesterolemia 07/20/2018  . Type 2 diabetes mellitus with hyperlipidemia ([place]) 07/20/2018  . Coronary artery calcification seen on CAT scan 07/20/2018  . Mass of left lung 05/22/2018  . Hypokalemia 05/13/2018  . Hypoxemia 05/13/2018  . Diabetes mellitus without complication ([place]) 56/25/6389  . Environmental allergies 02/07/2015  . IPF (idiopathic pulmonary fibrosis) ([place]) 07/15/2014  . Chronic cough 02/11/2014  . ILD (interstitial lung disease) ([place]) 11/18/2013  . Chronic rhinitis 10/02/2010  . GERD (gastroesophageal reflux disease) 10/02/2010  . INTERSTITIAL LUNG DISEASE 07/10/2010  . DEPRESSION/ANXIETY 04/12/2008  . Irritable bowel syndrome 10/09/2007  . Mass of anus 10/09/2007  . DIARRHEA, CHRONIC 10/09/2007   Past Medical History:  Diagnosis Date  . Bronchiectasis    PFTs August 23, 2010 VC 82%  dlco 62 > 129 CORRECTED  . Chronic diarrhea   . Chronic rhinitis    sinus CT ordered August 23, 2010  . Diabetes mellitus ([place])   . Hyperplastic colon polyp   . IBS (irritable bowel syndrome)   . ILD (interstitial lung disease) ([place])   . Stenosis of rectum and anus   . Thyroid disease     Family History  Problem Relation Age of Onset  . Colon polyps Sister   . Heart disease Sister   . Diabetes Sister   . Heart disease Mother   . Colon cancer Neg Hx     Past  Surgical History:  Procedure Laterality Date  .  [place]    . BRONCHIAL BIOPSY  11/13/2019   Procedure: BRONCHIAL BIOPSIES;  Surgeon: Collene Gobble, MD;  Location: [facility];  Service: Cardiopulmonary;;  . BRONCHIAL BRUSHINGS  11/13/2019   Procedure: BRONCHIAL BRUSHINGS;  Surgeon: Collene Gobble, MD;  Location: [facility];  Service: Cardiopulmonary;;  . CHOLECYSTECTOMY    . DILATION AND CURETTAGE OF UTERUS    . HAND SURGERY  1991   left hand; Baker's Cyst removed  . migraine    . OPEN REDUCTION INTERNAL FIXATION (ORIF) DISTAL RADIAL FRACTURE Left 12/31/2019   Procedure: CLOSED REDUCTION LEFT DISTAL RADIUS FRACTURE;  Surgeon: Leandrew Koyanagi, MD;  Location: [facility];  Service: Orthopedics;  Laterality: Left;  . tear duct surgery    . TOTAL HIP ARTHROPLASTY Right 11/04/2019   Procedure: TOTAL HIP ARTHROPLASTY ANTERIOR APPROACH;  Surgeon: Paralee Cancel, MD;  Location: [facility];  Service: Orthopedics;  Laterality: Right;  . TOTAL HIP ARTHROPLASTY Left 12/31/2019   Procedure: LEFT TOTAL HIP ARTHROPLASTY ANTERIOR APPROACH;  Surgeon: Leandrew Koyanagi, MD;  Location: [facility];  Service: Orthopedics;  Laterality: Left;  [name] VIDEO BRONCHOSCOPY N/A 11/13/2019   Procedure: VIDEO BRONCHOSCOPY;  Surgeon: Collene Gobble, MD;  Location: [facility];  Service: Cardiopulmonary;  Laterality: N/A;   Social History   Occupational History  . Occupation: retired  Tobacco Use  . Smoking status: Former Smoker    Packs/day: 3.00    Years: 15.00    Pack years: 45.00    Types: Cigarettes    Quit date: 06/11/1976    Years since quitting: 43.6  . Smokeless tobacco: Never Used  Substance and Sexual Activity  . Alcohol use: No  . Drug use: No  . Sexual activity: Not on file

## 2020-02-01 ENCOUNTER — Other Ambulatory Visit: Payer: Medicare Other

## 2020-02-01 ENCOUNTER — Ambulatory Visit: Payer: Medicare Other

## 2020-02-02 NOTE — Telephone Encounter (Signed)
Faxed ATTN ANGIE

## 2020-02-08 ENCOUNTER — Ambulatory Visit: Payer: Medicare Other

## 2020-02-08 ENCOUNTER — Other Ambulatory Visit: Payer: Medicare Other

## 2020-02-08 ENCOUNTER — Ambulatory Visit: Payer: Medicare Other | Admitting: Internal Medicine

## 2020-02-18 ENCOUNTER — Ambulatory Visit: Payer: Medicare Other | Admitting: Orthopaedic Surgery

## 2020-02-29 ENCOUNTER — Encounter: Payer: Self-pay | Admitting: *Deleted

## 2020-02-29 NOTE — Progress Notes (Unsigned)
Received notice from [employer] that patient requested cancellation of monitor. Notice placed in Monitor Tech office.

## 2020-03-07 ENCOUNTER — Encounter: Payer: Self-pay | Admitting: Gastroenterology

## 2020-03-11 ENCOUNTER — Telehealth (INDEPENDENT_AMBULATORY_CARE_PROVIDER_SITE_OTHER): Payer: Medicare Other | Admitting: Cardiovascular Disease

## 2020-03-11 ENCOUNTER — Telehealth: Payer: Self-pay | Admitting: *Deleted

## 2020-03-11 NOTE — Telephone Encounter (Signed)
Attempted to reach the patient times 4 on both numbers listed for her virtual appointment with Dr. Sallyanne Kuster. Call was unable to be completed and the second number rang busy.

## 2020-03-24 NOTE — Progress Notes (Signed)
Reviewed her event monitor.  Although only 5 days of recording were submitted, the monitor shows frequent episodes of paroxysmal atrial fibrillation with rapid ventricular response. She is not on anticoagulation due to a history of recent severe falls.  Need an appointment with the patient and her family to discuss pros and cons of anticoagulation. A virtual visit should be OK.  Increase metoprolol to 50 mg twice daily.

## 2020-03-25 ENCOUNTER — Telehealth: Payer: Self-pay | Admitting: *Deleted

## 2020-03-25 ENCOUNTER — Encounter: Payer: Self-pay | Admitting: *Deleted

## 2020-03-25 NOTE — Telephone Encounter (Signed)
Attempted to reach the patient at both numbers listed. One rings busy and the other is not in service.    Per Dr. Sallyanne Kuster: can you please send in new rx for metoprolol and make f/u appt. Thanks.    Monitor results: Although only 5 days of recording were submitted, the monitor shows frequent episodes of paroxysmal atrial fibrillation with rapid ventricular response. She is not on anticoagulation due to a history of recent severe falls.  Need an appointment with the patient and her family to discuss pros and cons of anticoagulation. A virtual visit should be OK.  Increase metoprolol to 50 mg twice daily.

## 2020-03-30 NOTE — Telephone Encounter (Signed)
Patient's son returning call. 

## 2020-03-30 NOTE — Telephone Encounter (Signed)
Spoke with the pts son and he reports that the pt had passed away 2020-03-01. I gave him my sincere sympathy and he says he appreciated my call.   Will forward to Med Rec, Dr. Sallyanne Kuster and Lattie Haw.

## 2020-03-30 NOTE — Telephone Encounter (Signed)
Left a message for the patient's son to call back.  

## 2022-01-01 ENCOUNTER — Other Ambulatory Visit: Payer: Self-pay

## 2022-03-23 IMAGING — PT NM PET TUM IMG RESTAG (PS) SKULL BASE T - THIGH
1 series · 5 of 5 positions shown · non-contrast
Comparison: PET-CT from 06/05/2018

CLINICAL DATA: Initial treatment strategy for lung mass.

EXAM:
NUCLEAR MEDICINE PET SKULL BASE TO THIGH
TECHNIQUE: 7.21 mCi F-18 FDG was injected intravenously. Full-ring PET imaging
was performed from the skull base to thigh after the radiotracer. CT
data was obtained and used for attenuation correction and anatomic
localization.
Fasting blood glucose: 138 mg/dl

[Series 8088: results mm oncology reading · 5.0mm · 0.50mm/px · 5 of 5 slices shown]
[im 1/5]
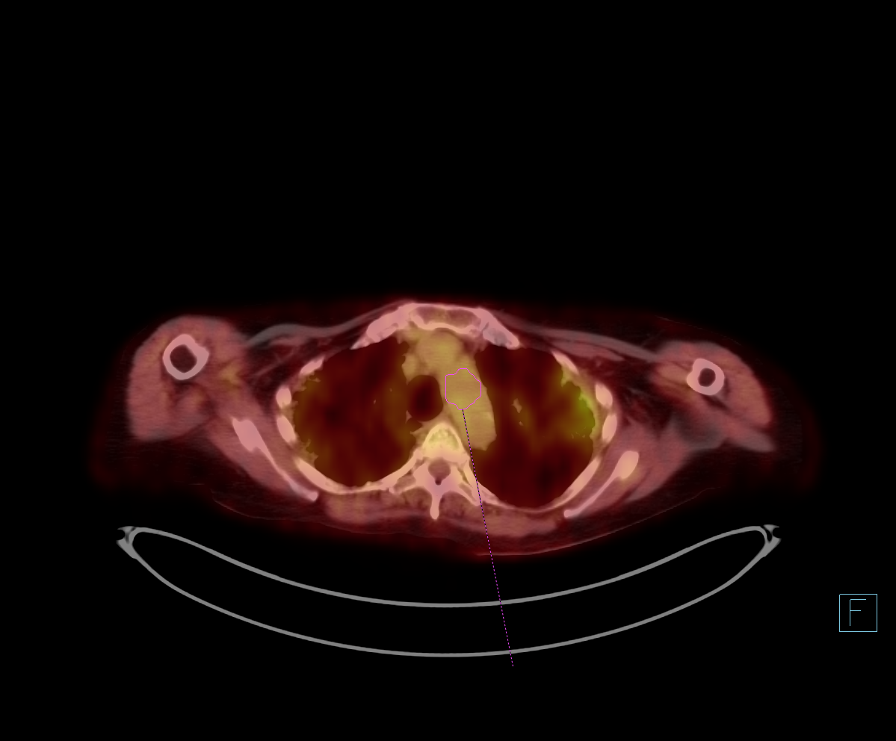
[im 2/5]
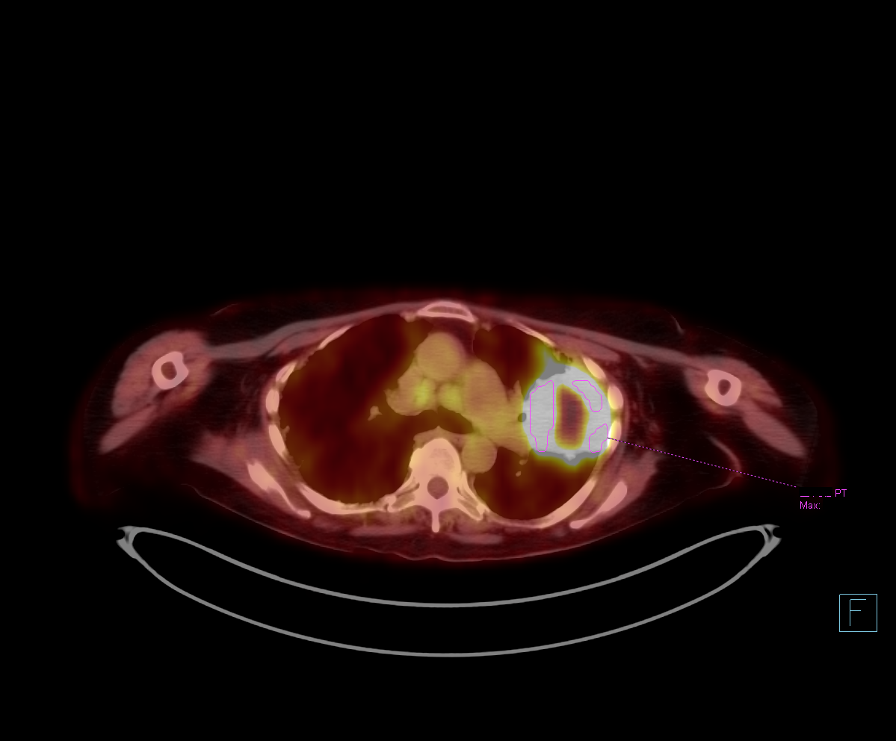
[im 3/5]
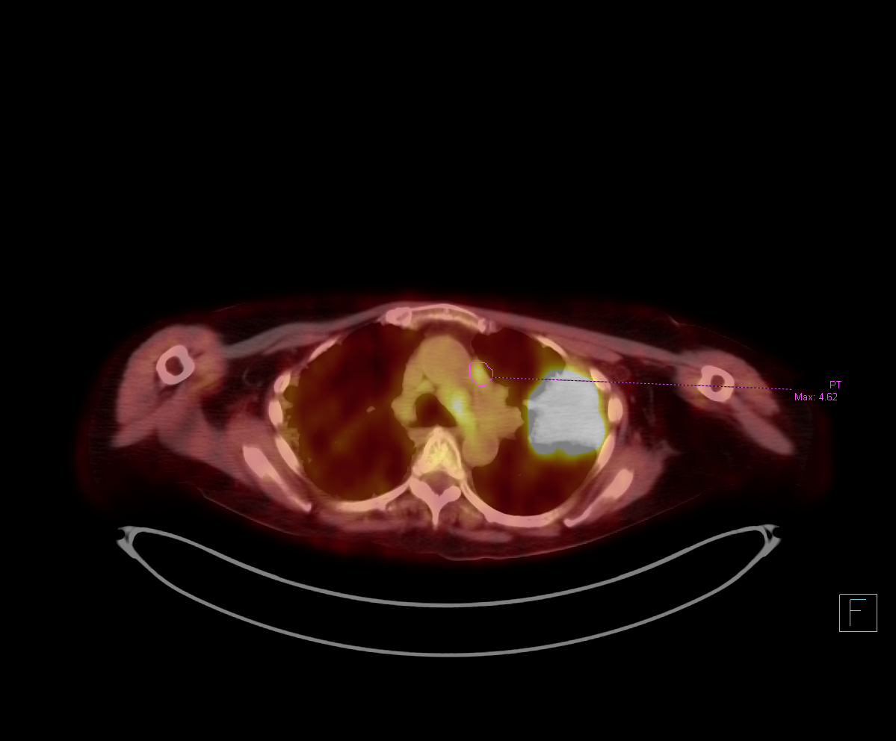
[im 4/5]
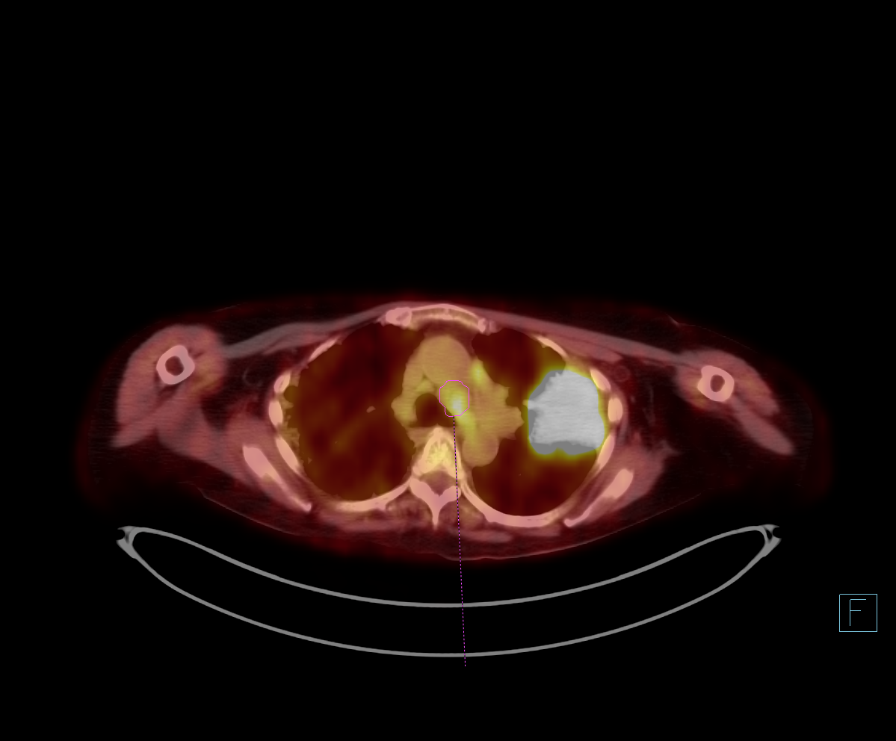
[im 5/5]
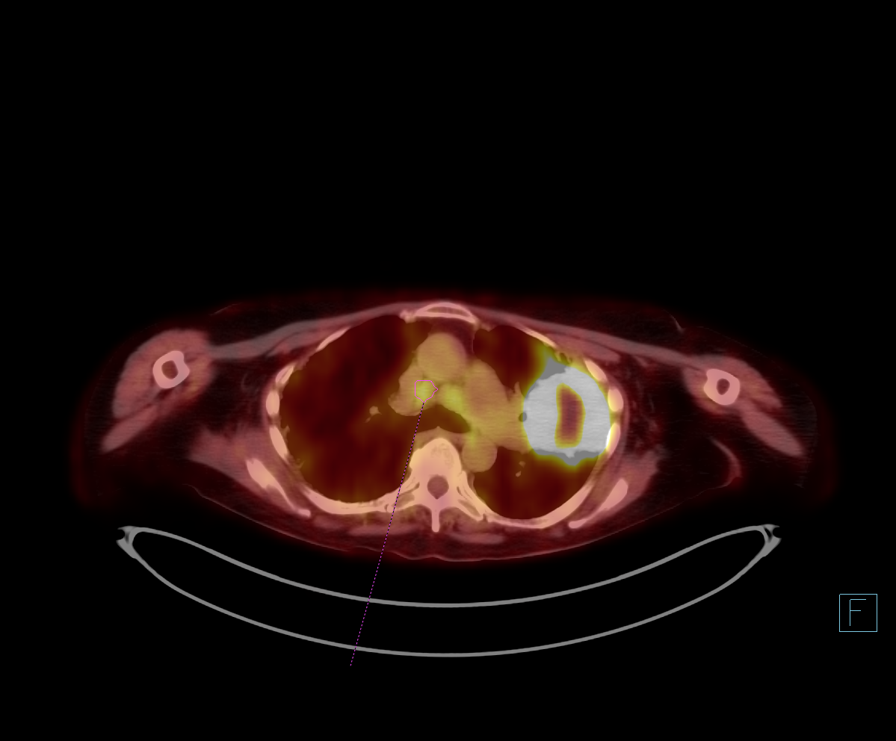

[5 of 5 positions shown; findings below may reference images not displayed]

FINDINGS: Mediastinal blood pool activity: SUV max

Liver activity: SUV max NA

NECK: No hypermetabolic lymph nodes in the neck.

Incidental CT findings: Asymmetric opacification of the left mastoid
air cells with evidence of chronic mucoperiosteal thickening.

CHEST: The necrotic lobe lung mass is intensely hypermetabolic
measuring 6.4 x 5.8 cm and has an SUV max of 19.82. Central
photopenia noted compatible with tumor necrosis. Tumor involvement
of the left hilum is noted with obstruction of the lingular bronchus
resulting in postobstructive atelectasis and consolidation. On the
previous PET-CT mass measured 2.0 x 2.3 cm with SUV max of 14.07.

FDG avid left pre-vascular lymph node measures 0.8 cm with SUV max
of 4.6.

FDG avid left lower paratracheal lymph node measures 1.0 cm within
SUV max of 5.13.

FDG avid right paratracheal lymph node measures 1.1 cm within SUV
max of 4.37.

No hypermetabolic supraclavicular or axillary lymph nodes.

Incidental CT findings: Paraseptal and centrilobular emphysema.
Diffuse, bilateral subpleural reticulation and mild traction
bronchiectasis identified suggesting superimposed interstitial lung
disease.

ABDOMEN/PELVIS: No abnormal FDG uptake within the liver, spleen,
pancreas, or adrenal glands. No FDG avid lymph nodes within the
abdomen or pelvis. No inguinal adenopathy.

Incidental CT findings: Aortic atherosclerosis. No aneurysm.
Cholecystectomy.

SKELETON: No focal hypermetabolic activity to suggest skeletal
metastasis.

Incidental CT findings: none
IMPRESSION: 1. Necrotic left upper lobe lung mass is identified which
demonstrates considerable increase in size from 06/05/2018. Mass
invades the left hilum and there is associated post obstructive
atelectasis/consolidation of the lingula.
2. FDG avid bilateral mediastinal lymph nodes worrisome for
metastatic adenopathy.
3. No findings of nodal metastasis or solid organ metastasis within
the abdomen or pelvis. No findings of bone metastasis.
4. Emphysema with superimposed signs of chronic interstitial lung
disease.
5. Aortic atherosclerosis and coronary artery calcifications.
6. Aortic Atherosclerosis (FXCO8-ZOI.I) and Emphysema (FXCO8-HLC.N).

## 2024-02-26 ENCOUNTER — Other Ambulatory Visit: Payer: Self-pay
